# Patient Record
Sex: Male | Born: 1965 | Race: Black or African American | Hispanic: No | Marital: Single | Smoking: Current every day smoker
Health system: Southern US, Community
[De-identification: ages and names within clinical notes are randomized; demographics above are authoritative.]

## PROBLEM LIST (undated history)

## (undated) DIAGNOSIS — I509 Heart failure, unspecified: Secondary | ICD-10-CM

## (undated) DIAGNOSIS — F419 Anxiety disorder, unspecified: Secondary | ICD-10-CM

## (undated) DIAGNOSIS — F329 Major depressive disorder, single episode, unspecified: Secondary | ICD-10-CM

## (undated) DIAGNOSIS — F32A Depression, unspecified: Secondary | ICD-10-CM

## (undated) DIAGNOSIS — I214 Non-ST elevation (NSTEMI) myocardial infarction: Secondary | ICD-10-CM

## (undated) DIAGNOSIS — I639 Cerebral infarction, unspecified: Secondary | ICD-10-CM

## (undated) DIAGNOSIS — G4730 Sleep apnea, unspecified: Secondary | ICD-10-CM

## (undated) DIAGNOSIS — I4891 Unspecified atrial fibrillation: Secondary | ICD-10-CM

## (undated) DIAGNOSIS — G4710 Hypersomnia, unspecified: Secondary | ICD-10-CM

## (undated) DIAGNOSIS — I472 Ventricular tachycardia, unspecified: Secondary | ICD-10-CM

## (undated) DIAGNOSIS — IMO0001 Reserved for inherently not codable concepts without codable children: Secondary | ICD-10-CM

## (undated) DIAGNOSIS — I10 Essential (primary) hypertension: Secondary | ICD-10-CM

## (undated) DIAGNOSIS — E662 Morbid (severe) obesity with alveolar hypoventilation: Secondary | ICD-10-CM

## (undated) HISTORY — DX: Anxiety disorder, unspecified: F41.9

## (undated) HISTORY — DX: Morbid (severe) obesity with alveolar hypoventilation: E66.2

## (undated) HISTORY — DX: Sleep apnea, unspecified: G47.30

## (undated) HISTORY — DX: Hypersomnia, unspecified: G47.10

## (undated) HISTORY — DX: Morbid (severe) obesity due to excess calories: E66.01

## (undated) HISTORY — PX: TRACHEOSTOMY: SUR1362

---

## 1998-09-19 ENCOUNTER — Emergency Department (HOSPITAL_COMMUNITY): Admission: EM | Admit: 1998-09-19 | Discharge: 1998-09-19 | Payer: Self-pay | Admitting: Emergency Medicine

## 2003-12-05 DIAGNOSIS — I214 Non-ST elevation (NSTEMI) myocardial infarction: Secondary | ICD-10-CM

## 2003-12-05 HISTORY — DX: Non-ST elevation (NSTEMI) myocardial infarction: I21.4

## 2003-12-08 ENCOUNTER — Other Ambulatory Visit: Payer: Self-pay

## 2007-05-07 ENCOUNTER — Inpatient Hospital Stay: Payer: Self-pay | Admitting: Internal Medicine

## 2007-05-07 ENCOUNTER — Other Ambulatory Visit: Payer: Self-pay

## 2008-05-26 ENCOUNTER — Other Ambulatory Visit: Payer: Self-pay

## 2008-05-27 ENCOUNTER — Inpatient Hospital Stay: Payer: Self-pay | Admitting: Internal Medicine

## 2012-01-02 ENCOUNTER — Emergency Department: Payer: Self-pay | Admitting: Emergency Medicine

## 2012-01-23 ENCOUNTER — Emergency Department: Payer: Self-pay | Admitting: Unknown Physician Specialty

## 2012-05-16 ENCOUNTER — Inpatient Hospital Stay: Payer: Self-pay | Admitting: Internal Medicine

## 2012-05-16 LAB — BASIC METABOLIC PANEL
Anion Gap: 9 (ref 7–16)
BUN: 17 mg/dL (ref 7–18)
Chloride: 102 mmol/L (ref 98–107)
Co2: 28 mmol/L (ref 21–32)
EGFR (African American): >60
EGFR (Non-African Amer.): >60
Osmolality: 281 (ref 275–301)
Potassium: 3.9 mmol/L (ref 3.5–5.1)

## 2012-05-16 LAB — CK TOTAL AND CKMB (NOT AT ARMC)
CK, Total: 371 U/L — ABNORMAL HIGH (ref 35–232)
CK, Total: 297 U/L — ABNORMAL HIGH (ref 35–232)
CK-MB: 5.1 ng/mL — ABNORMAL HIGH (ref 0.5–3.6)
CK-MB: 4.1 ng/mL — ABNORMAL HIGH (ref 0.5–3.6)

## 2012-05-16 LAB — CBC
HCT: 41.4 % (ref 40.0–52.0)
HGB: 13.9 g/dL (ref 13.0–18.0)
MCH: 31.8 pg (ref 26.0–34.0)
MCHC: 33.5 g/dL (ref 32.0–36.0)
MCV: 95 fL (ref 80–100)
Platelet: 195 10*3/uL (ref 150–440)
RDW: 13.4 % (ref 11.5–14.5)
WBC: 4.9 10*3/uL (ref 3.8–10.6)

## 2012-05-16 LAB — TROPONIN I: Troponin-I: 0.09 ng/mL — ABNORMAL HIGH

## 2012-05-16 LAB — PROTIME-INR: Prothrombin Time: 14.6 secs (ref 11.5–14.7)

## 2012-05-17 DIAGNOSIS — I059 Rheumatic mitral valve disease, unspecified: Secondary | ICD-10-CM

## 2012-05-17 LAB — LIPID PANEL
Cholesterol: 154 mg/dL (ref 0–200)
HDL Cholesterol: 38 mg/dL — ABNORMAL LOW (ref 40–60)
Ldl Cholesterol, Calc: 99 mg/dL (ref 0–100)
Triglycerides: 85 mg/dL (ref 0–200)
VLDL Cholesterol, Calc: 17 mg/dL (ref 5–40)

## 2012-05-17 LAB — HEMOGLOBIN A1C: Hemoglobin A1C: 5.6 % (ref 4.2–6.3)

## 2012-05-17 LAB — CBC WITH DIFFERENTIAL/PLATELET
Basophil #: 0 10*3/uL (ref 0.0–0.1)
Eosinophil #: 0.4 10*3/uL (ref 0.0–0.7)
HCT: 41.8 % (ref 40.0–52.0)
HGB: 13.8 g/dL (ref 13.0–18.0)
Lymphocyte #: 1.3 10*3/uL (ref 1.0–3.6)
MCH: 31.8 pg (ref 26.0–34.0)
MCHC: 33 g/dL (ref 32.0–36.0)
MCV: 96 fL (ref 80–100)
Monocyte #: 0.8 x10 3/mm (ref 0.2–1.0)
Monocyte %: 15.9 %
Neutrophil #: 2.3 10*3/uL (ref 1.4–6.5)
Neutrophil %: 47.4 %
Platelet: 197 10*3/uL (ref 150–440)
RBC: 4.34 10*6/uL — ABNORMAL LOW (ref 4.40–5.90)
RDW: 13.8 % (ref 11.5–14.5)
WBC: 4.8 10*3/uL (ref 3.8–10.6)

## 2012-05-17 LAB — COMPREHENSIVE METABOLIC PANEL
Albumin: 3.6 g/dL (ref 3.4–5.0)
Alkaline Phosphatase: 64 U/L (ref 50–136)
Anion Gap: 7 (ref 7–16)
BUN: 18 mg/dL (ref 7–18)
Bilirubin,Total: 0.8 mg/dL (ref 0.2–1.0)
Calcium, Total: 8.8 mg/dL (ref 8.5–10.1)
Chloride: 101 mmol/L (ref 98–107)
Co2: 29 mmol/L (ref 21–32)
Creatinine: 0.83 mg/dL (ref 0.60–1.30)
Potassium: 3.5 mmol/L (ref 3.5–5.1)
SGOT(AST): 24 U/L (ref 15–37)
SGPT (ALT): 21 U/L
Sodium: 137 mmol/L (ref 136–145)
Total Protein: 7.3 g/dL (ref 6.4–8.2)

## 2012-05-17 LAB — CK TOTAL AND CKMB (NOT AT ARMC)
CK, Total: 230 U/L (ref 35–232)
CK-MB: 3.1 ng/mL (ref 0.5–3.6)

## 2012-05-17 LAB — TROPONIN I: Troponin-I: 0.06 ng/mL — ABNORMAL HIGH

## 2014-11-03 ENCOUNTER — Ambulatory Visit: Payer: Self-pay | Admitting: Internal Medicine

## 2014-11-16 ENCOUNTER — Inpatient Hospital Stay: Payer: Self-pay | Admitting: Internal Medicine

## 2014-11-16 LAB — CBC
HCT: 43.9 % (ref 40.0–52.0)
HGB: 13.9 g/dL (ref 13.0–18.0)
MCH: 30.3 pg (ref 26.0–34.0)
MCHC: 31.6 g/dL — ABNORMAL LOW (ref 32.0–36.0)
MCV: 96 fL (ref 80–100)
Platelet: 194 10*3/uL (ref 150–440)
RBC: 4.58 10*6/uL (ref 4.40–5.90)
RDW: 14.5 % (ref 11.5–14.5)
WBC: 6 10*3/uL (ref 3.8–10.6)

## 2014-11-16 LAB — PROTIME-INR
INR: 1.1
PROTHROMBIN TIME: 13.8 s (ref 11.5–14.7)

## 2014-11-16 LAB — DRUG SCREEN, URINE
AMPHETAMINES, UR SCREEN: NEGATIVE (ref ?–1000)
BARBITURATES, UR SCREEN: NEGATIVE (ref ?–200)
BENZODIAZEPINE, UR SCRN: NEGATIVE (ref ?–200)
COCAINE METABOLITE, UR ~~LOC~~: NEGATIVE (ref ?–300)
Cannabinoid 50 Ng, Ur ~~LOC~~: NEGATIVE (ref ?–50)
MDMA (ECSTASY) UR SCREEN: NEGATIVE (ref ?–500)
METHADONE, UR SCREEN: NEGATIVE (ref ?–300)
Opiate, Ur Screen: NEGATIVE (ref ?–300)
Phencyclidine (PCP) Ur S: NEGATIVE (ref ?–25)
TRICYCLIC, UR SCREEN: NEGATIVE (ref ?–1000)

## 2014-11-16 LAB — BASIC METABOLIC PANEL
Anion Gap: 2 — ABNORMAL LOW (ref 7–16)
BUN: 15 mg/dL (ref 7–18)
Calcium, Total: 8.6 mg/dL (ref 8.5–10.1)
Chloride: 99 mmol/L (ref 98–107)
Co2: 35 mmol/L — ABNORMAL HIGH (ref 21–32)
Creatinine: 1 mg/dL (ref 0.60–1.30)
EGFR (Non-African Amer.): >60
Glucose: 111 mg/dL — ABNORMAL HIGH (ref 65–99)
OSMOLALITY: 273 (ref 275–301)
Potassium: 4.4 mmol/L (ref 3.5–5.1)
SODIUM: 136 mmol/L (ref 136–145)

## 2014-11-16 LAB — CK TOTAL AND CKMB (NOT AT ARMC)
CK, TOTAL: 389 U/L — AB (ref 39–308)
CK-MB: 10.2 ng/mL — ABNORMAL HIGH (ref 0.5–3.6)

## 2014-11-16 LAB — APTT: ACTIVATED PTT: 28.1 s (ref 23.6–35.9)

## 2014-11-16 LAB — TROPONIN I
Troponin-I: 0.09 ng/mL — ABNORMAL HIGH
Troponin-I: 0.08 ng/mL — ABNORMAL HIGH

## 2014-11-16 LAB — PRO B NATRIURETIC PEPTIDE: B-TYPE NATIURETIC PEPTID: 1530 pg/mL — AB (ref 0–125)

## 2014-11-17 LAB — LIPID PANEL
Cholesterol: 150 mg/dL (ref 0–200)
HDL Cholesterol: 39 mg/dL — ABNORMAL LOW (ref 40–60)
Ldl Cholesterol, Calc: 95 mg/dL (ref 0–100)
Triglycerides: 81 mg/dL (ref 0–200)
VLDL CHOLESTEROL, CALC: 16 mg/dL (ref 5–40)

## 2014-11-17 LAB — CBC WITH DIFFERENTIAL/PLATELET
BASOS ABS: 0 10*3/uL (ref 0.0–0.1)
BASOS PCT: 0.4 %
Eosinophil #: 0 10*3/uL (ref 0.0–0.7)
Eosinophil %: 0.8 %
HCT: 44.8 % (ref 40.0–52.0)
HGB: 14.6 g/dL (ref 13.0–18.0)
LYMPHS ABS: 0.6 10*3/uL — AB (ref 1.0–3.6)
Lymphocyte %: 11.6 %
MCH: 31 pg (ref 26.0–34.0)
MCHC: 32.6 g/dL (ref 32.0–36.0)
MCV: 95 fL (ref 80–100)
MONO ABS: 0.4 x10 3/mm (ref 0.2–1.0)
MONOS PCT: 7.3 %
Neutrophil #: 4 10*3/uL (ref 1.4–6.5)
Neutrophil %: 79.9 %
Platelet: 196 10*3/uL (ref 150–440)
RBC: 4.71 10*6/uL (ref 4.40–5.90)
RDW: 14.2 % (ref 11.5–14.5)
WBC: 5 10*3/uL (ref 3.8–10.6)

## 2014-11-17 LAB — BASIC METABOLIC PANEL
Anion Gap: 6 — ABNORMAL LOW (ref 7–16)
BUN: 14 mg/dL (ref 7–18)
Calcium, Total: 8.7 mg/dL (ref 8.5–10.1)
Chloride: 99 mmol/L (ref 98–107)
Co2: 33 mmol/L — ABNORMAL HIGH (ref 21–32)
Creatinine: 1.11 mg/dL (ref 0.60–1.30)
EGFR (African American): >60
EGFR (Non-African Amer.): >60
GLUCOSE: 114 mg/dL — AB (ref 65–99)
Osmolality: 277 (ref 275–301)
POTASSIUM: 3.6 mmol/L (ref 3.5–5.1)
Sodium: 138 mmol/L (ref 136–145)

## 2014-11-17 LAB — PHOSPHORUS: Phosphorus: 3.1 mg/dL (ref 2.5–4.9)

## 2014-11-17 LAB — TROPONIN I: Troponin-I: 0.08 ng/mL — ABNORMAL HIGH

## 2014-11-17 LAB — MAGNESIUM: Magnesium: 1.6 mg/dL — ABNORMAL LOW

## 2014-11-19 LAB — BASIC METABOLIC PANEL
Anion Gap: 4 — ABNORMAL LOW (ref 7–16)
BUN: 27 mg/dL — AB (ref 7–18)
CALCIUM: 8.1 mg/dL — AB (ref 8.5–10.1)
CHLORIDE: 99 mmol/L (ref 98–107)
CREATININE: 1.28 mg/dL (ref 0.60–1.30)
Co2: 37 mmol/L — ABNORMAL HIGH (ref 21–32)
Glucose: 90 mg/dL (ref 65–99)
OSMOLALITY: 284 (ref 275–301)
Potassium: 3.3 mmol/L — ABNORMAL LOW (ref 3.5–5.1)
SODIUM: 140 mmol/L (ref 136–145)

## 2014-11-19 LAB — MAGNESIUM: Magnesium: 2.1 mg/dL

## 2014-11-19 LAB — PHOSPHORUS: Phosphorus: 5.3 mg/dL — ABNORMAL HIGH (ref 2.5–4.9)

## 2014-11-20 LAB — PHOSPHORUS: PHOSPHORUS: 5.1 mg/dL — AB (ref 2.5–4.9)

## 2014-11-20 LAB — BASIC METABOLIC PANEL
Anion Gap: 4 — ABNORMAL LOW (ref 7–16)
BUN: 30 mg/dL — ABNORMAL HIGH (ref 7–18)
Calcium, Total: 8.2 mg/dL — ABNORMAL LOW (ref 8.5–10.1)
Chloride: 100 mmol/L (ref 98–107)
Co2: 36 mmol/L — ABNORMAL HIGH (ref 21–32)
Creatinine: 1.19 mg/dL (ref 0.60–1.30)
EGFR (African American): >60
EGFR (Non-African Amer.): >60
GLUCOSE: 86 mg/dL (ref 65–99)
Osmolality: 285 (ref 275–301)
Potassium: 4.3 mmol/L (ref 3.5–5.1)
SODIUM: 140 mmol/L (ref 136–145)

## 2014-11-20 LAB — MAGNESIUM: MAGNESIUM: 2.1 mg/dL

## 2014-11-21 LAB — BASIC METABOLIC PANEL
ANION GAP: 5 — AB (ref 7–16)
BUN: 29 mg/dL — AB (ref 7–18)
CHLORIDE: 99 mmol/L (ref 98–107)
CREATININE: 1.06 mg/dL (ref 0.60–1.30)
Calcium, Total: 8.4 mg/dL — ABNORMAL LOW (ref 8.5–10.1)
Co2: 36 mmol/L — ABNORMAL HIGH (ref 21–32)
EGFR (African American): >60
EGFR (Non-African Amer.): >60
Glucose: 86 mg/dL (ref 65–99)
Osmolality: 285 (ref 275–301)
POTASSIUM: 4 mmol/L (ref 3.5–5.1)
Sodium: 140 mmol/L (ref 136–145)

## 2014-11-21 LAB — PHOSPHORUS: PHOSPHORUS: 3.3 mg/dL (ref 2.5–4.9)

## 2014-11-21 LAB — TRIGLYCERIDES: TRIGLYCERIDES: 125 mg/dL (ref 0–200)

## 2014-11-21 LAB — MAGNESIUM: Magnesium: 2.1 mg/dL

## 2014-11-22 LAB — BASIC METABOLIC PANEL
ANION GAP: 3 — AB (ref 7–16)
BUN: 29 mg/dL — ABNORMAL HIGH (ref 7–18)
CALCIUM: 8.6 mg/dL (ref 8.5–10.1)
CREATININE: 0.87 mg/dL (ref 0.60–1.30)
Chloride: 101 mmol/L (ref 98–107)
Co2: 36 mmol/L — ABNORMAL HIGH (ref 21–32)
EGFR (African American): >60
EGFR (Non-African Amer.): >60
Glucose: 120 mg/dL — ABNORMAL HIGH (ref 65–99)
Osmolality: 286 (ref 275–301)
POTASSIUM: 3.7 mmol/L (ref 3.5–5.1)
Sodium: 140 mmol/L (ref 136–145)

## 2014-11-22 LAB — PLATELET COUNT: Platelet: 212 10*3/uL (ref 150–440)

## 2014-11-22 LAB — PHOSPHORUS: Phosphorus: 2.7 mg/dL (ref 2.5–4.9)

## 2014-11-22 LAB — MAGNESIUM: Magnesium: 1.8 mg/dL

## 2014-11-23 LAB — MAGNESIUM: Magnesium: 2 mg/dL

## 2014-11-23 LAB — BASIC METABOLIC PANEL
Anion Gap: 5 — ABNORMAL LOW (ref 7–16)
BUN: 33 mg/dL — ABNORMAL HIGH (ref 7–18)
CHLORIDE: 98 mmol/L (ref 98–107)
CREATININE: 0.92 mg/dL (ref 0.60–1.30)
Calcium, Total: 8.5 mg/dL (ref 8.5–10.1)
Co2: 36 mmol/L — ABNORMAL HIGH (ref 21–32)
EGFR (African American): >60
Glucose: 111 mg/dL — ABNORMAL HIGH (ref 65–99)
OSMOLALITY: 285 (ref 275–301)
POTASSIUM: 4 mmol/L (ref 3.5–5.1)
Sodium: 139 mmol/L (ref 136–145)

## 2014-11-23 LAB — PHOSPHORUS: PHOSPHORUS: 3.5 mg/dL (ref 2.5–4.9)

## 2014-11-24 LAB — BASIC METABOLIC PANEL
ANION GAP: 3 — AB (ref 7–16)
BUN: 35 mg/dL — ABNORMAL HIGH (ref 7–18)
CALCIUM: 8.8 mg/dL (ref 8.5–10.1)
CHLORIDE: 97 mmol/L — AB (ref 98–107)
CREATININE: 0.9 mg/dL (ref 0.60–1.30)
Co2: 36 mmol/L — ABNORMAL HIGH (ref 21–32)
EGFR (African American): >60
EGFR (Non-African Amer.): >60
Glucose: 113 mg/dL — ABNORMAL HIGH (ref 65–99)
OSMOLALITY: 281 (ref 275–301)
POTASSIUM: 4.2 mmol/L (ref 3.5–5.1)
SODIUM: 136 mmol/L (ref 136–145)

## 2014-11-24 LAB — PHOSPHORUS: Phosphorus: 3.5 mg/dL (ref 2.5–4.9)

## 2014-11-24 LAB — TRIGLYCERIDES: TRIGLYCERIDES: 164 mg/dL (ref 0–200)

## 2014-11-24 LAB — EXPECTORATED SPUTUM ASSESSMENT W REFEX TO RESP CULTURE

## 2014-11-24 LAB — MAGNESIUM: MAGNESIUM: 2 mg/dL

## 2014-11-25 LAB — CBC WITH DIFFERENTIAL/PLATELET
BASOS ABS: 0 10*3/uL (ref 0.0–0.1)
Basophil %: 0.5 %
EOS ABS: 0.5 10*3/uL (ref 0.0–0.7)
EOS PCT: 5.1 %
HCT: 44.2 % (ref 40.0–52.0)
HGB: 14 g/dL (ref 13.0–18.0)
LYMPHS ABS: 0.9 10*3/uL — AB (ref 1.0–3.6)
LYMPHS PCT: 9.6 %
MCH: 30 pg (ref 26.0–34.0)
MCHC: 31.6 g/dL — ABNORMAL LOW (ref 32.0–36.0)
MCV: 95 fL (ref 80–100)
MONO ABS: 1.7 x10 3/mm — AB (ref 0.2–1.0)
Monocyte %: 18.6 %
NEUTROS ABS: 6.2 10*3/uL (ref 1.4–6.5)
NEUTROS PCT: 66.2 %
Platelet: 234 10*3/uL (ref 150–440)
RBC: 4.66 10*6/uL (ref 4.40–5.90)
RDW: 14.4 % (ref 11.5–14.5)
WBC: 9.4 10*3/uL (ref 3.8–10.6)

## 2014-11-25 LAB — BASIC METABOLIC PANEL
Anion Gap: 4 — ABNORMAL LOW (ref 7–16)
BUN: 34 mg/dL — AB (ref 7–18)
CALCIUM: 8.8 mg/dL (ref 8.5–10.1)
CO2: 34 mmol/L — AB (ref 21–32)
Chloride: 98 mmol/L (ref 98–107)
Creatinine: 0.77 mg/dL (ref 0.60–1.30)
EGFR (African American): >60
GLUCOSE: 104 mg/dL — AB (ref 65–99)
OSMOLALITY: 280 (ref 275–301)
POTASSIUM: 4 mmol/L (ref 3.5–5.1)
Sodium: 136 mmol/L (ref 136–145)

## 2014-11-25 LAB — MAGNESIUM: Magnesium: 2.1 mg/dL

## 2014-11-25 LAB — PHOSPHORUS: PHOSPHORUS: 3.3 mg/dL (ref 2.5–4.9)

## 2014-11-26 DIAGNOSIS — J9600 Acute respiratory failure, unspecified whether with hypoxia or hypercapnia: Secondary | ICD-10-CM

## 2014-11-26 LAB — BASIC METABOLIC PANEL
ANION GAP: 6 — AB (ref 7–16)
BUN: 36 mg/dL — AB (ref 7–18)
CALCIUM: 8.5 mg/dL (ref 8.5–10.1)
CHLORIDE: 95 mmol/L — AB (ref 98–107)
CREATININE: 0.93 mg/dL (ref 0.60–1.30)
Co2: 37 mmol/L — ABNORMAL HIGH (ref 21–32)
EGFR (African American): >60
Glucose: 107 mg/dL — ABNORMAL HIGH (ref 65–99)
OSMOLALITY: 284 (ref 275–301)
Potassium: 3.6 mmol/L (ref 3.5–5.1)
Sodium: 138 mmol/L (ref 136–145)

## 2014-11-26 LAB — PHOSPHORUS: Phosphorus: 3.2 mg/dL (ref 2.5–4.9)

## 2014-11-26 LAB — MAGNESIUM: MAGNESIUM: 2 mg/dL

## 2014-11-27 DIAGNOSIS — J9600 Acute respiratory failure, unspecified whether with hypoxia or hypercapnia: Secondary | ICD-10-CM

## 2014-11-27 LAB — TROPONIN I
TROPONIN-I: 0.03 ng/mL
Troponin-I: 0.03 ng/mL

## 2014-11-27 LAB — COMPREHENSIVE METABOLIC PANEL
ALBUMIN: 2.3 g/dL — AB (ref 3.4–5.0)
ALT: 28 U/L
ANION GAP: 8 (ref 7–16)
Alkaline Phosphatase: 87 U/L
BUN: 40 mg/dL — AB (ref 7–18)
Bilirubin,Total: 0.4 mg/dL (ref 0.2–1.0)
CALCIUM: 8.9 mg/dL (ref 8.5–10.1)
Chloride: 96 mmol/L — ABNORMAL LOW (ref 98–107)
Co2: 38 mmol/L — ABNORMAL HIGH (ref 21–32)
Creatinine: 0.89 mg/dL (ref 0.60–1.30)
EGFR (African American): >60
GLUCOSE: 111 mg/dL — AB (ref 65–99)
Osmolality: 294 (ref 275–301)
Potassium: 3.6 mmol/L (ref 3.5–5.1)
SGOT(AST): 20 U/L (ref 15–37)
SODIUM: 142 mmol/L (ref 136–145)
TOTAL PROTEIN: 7.2 g/dL (ref 6.4–8.2)

## 2014-11-28 LAB — MAGNESIUM: MAGNESIUM: 1.9 mg/dL

## 2014-11-28 LAB — BASIC METABOLIC PANEL
Anion Gap: 6 — ABNORMAL LOW (ref 7–16)
BUN: 37 mg/dL — ABNORMAL HIGH (ref 7–18)
CALCIUM: 9.3 mg/dL (ref 8.5–10.1)
CO2: 39 mmol/L — AB (ref 21–32)
Chloride: 96 mmol/L — ABNORMAL LOW (ref 98–107)
Creatinine: 0.9 mg/dL (ref 0.60–1.30)
EGFR (African American): >60
Glucose: 128 mg/dL — ABNORMAL HIGH (ref 65–99)
Osmolality: 292 (ref 275–301)
Potassium: 3.5 mmol/L (ref 3.5–5.1)
SODIUM: 141 mmol/L (ref 136–145)

## 2014-11-28 LAB — TROPONIN I: Troponin-I: 0.03 ng/mL

## 2014-11-28 LAB — POTASSIUM: Potassium: 3.5 mmol/L (ref 3.5–5.1)

## 2014-11-29 LAB — BASIC METABOLIC PANEL
Anion Gap: 4 — ABNORMAL LOW (ref 7–16)
BUN: 33 mg/dL — ABNORMAL HIGH (ref 7–18)
CALCIUM: 9.1 mg/dL (ref 8.5–10.1)
Chloride: 96 mmol/L — ABNORMAL LOW (ref 98–107)
Co2: 41 mmol/L (ref 21–32)
Creatinine: 0.76 mg/dL (ref 0.60–1.30)
EGFR (African American): >60
EGFR (Non-African Amer.): >60
GLUCOSE: 106 mg/dL — AB (ref 65–99)
Osmolality: 289 (ref 275–301)
Potassium: 3.5 mmol/L (ref 3.5–5.1)
Sodium: 141 mmol/L (ref 136–145)

## 2014-11-29 LAB — PHOSPHORUS: PHOSPHORUS: 3.4 mg/dL (ref 2.5–4.9)

## 2014-11-29 LAB — MAGNESIUM: Magnesium: 2.1 mg/dL

## 2014-11-30 LAB — CBC WITH DIFFERENTIAL/PLATELET
Basophil #: 0.1 10*3/uL (ref 0.0–0.1)
Basophil %: 0.5 %
Eosinophil #: 0.3 10*3/uL (ref 0.0–0.7)
Eosinophil %: 3.2 %
HCT: 40.8 % (ref 40.0–52.0)
HGB: 13.2 g/dL (ref 13.0–18.0)
Lymphocyte #: 1.2 10*3/uL (ref 1.0–3.6)
Lymphocyte %: 11.6 %
MCH: 30.2 pg (ref 26.0–34.0)
MCHC: 32.3 g/dL (ref 32.0–36.0)
MCV: 93 fL (ref 80–100)
MONOS PCT: 14.6 %
Monocyte #: 1.5 x10 3/mm — ABNORMAL HIGH (ref 0.2–1.0)
NEUTROS ABS: 7.1 10*3/uL — AB (ref 1.4–6.5)
Neutrophil %: 70.1 %
Platelet: 248 10*3/uL (ref 150–440)
RBC: 4.37 10*6/uL — ABNORMAL LOW (ref 4.40–5.90)
RDW: 14.1 % (ref 11.5–14.5)
WBC: 10.1 10*3/uL (ref 3.8–10.6)

## 2014-11-30 LAB — MAGNESIUM: Magnesium: 1.9 mg/dL

## 2014-11-30 LAB — BASIC METABOLIC PANEL
ANION GAP: 7 (ref 7–16)
BUN: 27 mg/dL — ABNORMAL HIGH (ref 7–18)
CO2: 37 mmol/L — AB (ref 21–32)
CREATININE: 0.79 mg/dL (ref 0.60–1.30)
Calcium, Total: 9.1 mg/dL (ref 8.5–10.1)
Chloride: 96 mmol/L — ABNORMAL LOW (ref 98–107)
EGFR (African American): >60
Glucose: 112 mg/dL — ABNORMAL HIGH (ref 65–99)
Osmolality: 285 (ref 275–301)
POTASSIUM: 3.4 mmol/L — AB (ref 3.5–5.1)
Sodium: 140 mmol/L (ref 136–145)

## 2014-11-30 LAB — PHOSPHORUS
Phosphorus: 2.1 mg/dL — ABNORMAL LOW (ref 2.5–4.9)
Phosphorus: 4.6 mg/dL (ref 2.5–4.9)

## 2014-11-30 LAB — POTASSIUM: POTASSIUM: 3.3 mmol/L — AB (ref 3.5–5.1)

## 2014-12-01 LAB — PHOSPHORUS: PHOSPHORUS: 3.9 mg/dL (ref 2.5–4.9)

## 2014-12-01 LAB — MAGNESIUM: Magnesium: 1.8 mg/dL

## 2014-12-01 LAB — PROTIME-INR
INR: 1.1
PROTHROMBIN TIME: 14.4 s (ref 11.5–14.7)

## 2014-12-01 LAB — BASIC METABOLIC PANEL
ANION GAP: 7 (ref 7–16)
BUN: 29 mg/dL — ABNORMAL HIGH (ref 7–18)
CHLORIDE: 98 mmol/L (ref 98–107)
CO2: 35 mmol/L — AB (ref 21–32)
Calcium, Total: 9.1 mg/dL (ref 8.5–10.1)
Creatinine: 0.73 mg/dL (ref 0.60–1.30)
EGFR (African American): >60
Glucose: 108 mg/dL — ABNORMAL HIGH (ref 65–99)
Osmolality: 286 (ref 275–301)
Potassium: 3.9 mmol/L (ref 3.5–5.1)
Sodium: 140 mmol/L (ref 136–145)

## 2014-12-02 LAB — CBC WITH DIFFERENTIAL/PLATELET
BASOS PCT: 0.7 %
Basophil #: 0.1 10*3/uL (ref 0.0–0.1)
EOS ABS: 0.4 10*3/uL (ref 0.0–0.7)
Eosinophil %: 5.2 %
HCT: 39.9 % — ABNORMAL LOW (ref 40.0–52.0)
HGB: 12.7 g/dL — ABNORMAL LOW (ref 13.0–18.0)
LYMPHS ABS: 1.1 10*3/uL (ref 1.0–3.6)
LYMPHS PCT: 13.8 %
MCH: 30.2 pg (ref 26.0–34.0)
MCHC: 31.8 g/dL — AB (ref 32.0–36.0)
MCV: 95 fL (ref 80–100)
MONOS PCT: 11.7 %
Monocyte #: 0.9 x10 3/mm (ref 0.2–1.0)
NEUTROS PCT: 68.6 %
Neutrophil #: 5.5 10*3/uL (ref 1.4–6.5)
PLATELETS: 265 10*3/uL (ref 150–440)
RBC: 4.21 10*6/uL — ABNORMAL LOW (ref 4.40–5.90)
RDW: 14.1 % (ref 11.5–14.5)
WBC: 8.1 10*3/uL (ref 3.8–10.6)

## 2014-12-02 LAB — BASIC METABOLIC PANEL
ANION GAP: 6 — AB (ref 7–16)
BUN: 24 mg/dL — AB (ref 7–18)
CREATININE: 0.76 mg/dL (ref 0.60–1.30)
Calcium, Total: 9.3 mg/dL (ref 8.5–10.1)
Chloride: 96 mmol/L — ABNORMAL LOW (ref 98–107)
Co2: 38 mmol/L — ABNORMAL HIGH (ref 21–32)
EGFR (Non-African Amer.): >60
Glucose: 94 mg/dL (ref 65–99)
Osmolality: 283 (ref 275–301)
Potassium: 3.8 mmol/L (ref 3.5–5.1)
Sodium: 140 mmol/L (ref 136–145)

## 2014-12-02 LAB — APTT: ACTIVATED PTT: 36 s — AB (ref 23.6–35.9)

## 2014-12-02 LAB — PHOSPHORUS: Phosphorus: 3.8 mg/dL (ref 2.5–4.9)

## 2014-12-02 LAB — PROTIME-INR
INR: 1.1
PROTHROMBIN TIME: 14.5 s (ref 11.5–14.7)

## 2014-12-02 LAB — TRIGLYCERIDES: Triglycerides: 118 mg/dL (ref 0–200)

## 2014-12-02 LAB — MAGNESIUM: MAGNESIUM: 1.8 mg/dL

## 2014-12-03 LAB — CBC WITH DIFFERENTIAL/PLATELET
BASOS PCT: 0.9 %
Basophil #: 0.1 10*3/uL (ref 0.0–0.1)
Eosinophil #: 0.4 10*3/uL (ref 0.0–0.7)
Eosinophil %: 5.5 %
HCT: 42.2 % (ref 40.0–52.0)
HGB: 13.5 g/dL (ref 13.0–18.0)
Lymphocyte #: 1.2 10*3/uL (ref 1.0–3.6)
Lymphocyte %: 15 %
MCH: 30.2 pg (ref 26.0–34.0)
MCHC: 32.1 g/dL (ref 32.0–36.0)
MCV: 94 fL (ref 80–100)
MONO ABS: 0.9 x10 3/mm (ref 0.2–1.0)
MONOS PCT: 11.4 %
Neutrophil #: 5.3 10*3/uL (ref 1.4–6.5)
Neutrophil %: 67.2 %
Platelet: 309 10*3/uL (ref 150–440)
RBC: 4.49 10*6/uL (ref 4.40–5.90)
RDW: 14.3 % (ref 11.5–14.5)
WBC: 7.9 10*3/uL (ref 3.8–10.6)

## 2014-12-03 LAB — MAGNESIUM: Magnesium: 1.9 mg/dL

## 2014-12-03 LAB — BASIC METABOLIC PANEL
Anion Gap: 6 — ABNORMAL LOW (ref 7–16)
BUN: 30 mg/dL — ABNORMAL HIGH (ref 7–18)
CALCIUM: 9.3 mg/dL (ref 8.5–10.1)
CO2: 35 mmol/L — AB (ref 21–32)
Chloride: 96 mmol/L — ABNORMAL LOW (ref 98–107)
Creatinine: 0.75 mg/dL (ref 0.60–1.30)
EGFR (African American): >60
EGFR (Non-African Amer.): >60
Glucose: 92 mg/dL (ref 65–99)
OSMOLALITY: 280 (ref 275–301)
Potassium: 3.7 mmol/L (ref 3.5–5.1)
Sodium: 137 mmol/L (ref 136–145)

## 2014-12-03 LAB — URINE CULTURE

## 2014-12-03 LAB — HEPARIN LEVEL (UNFRACTIONATED)
ANTI-XA(UNFRACTIONATED): 0.6 [IU]/mL (ref 0.30–0.70)
Anti-Xa(Unfractionated): 0.94 IU/mL — ABNORMAL HIGH (ref 0.30–0.70)

## 2014-12-03 LAB — PHOSPHORUS: Phosphorus: 3.3 mg/dL (ref 2.5–4.9)

## 2014-12-04 ENCOUNTER — Ambulatory Visit: Payer: Self-pay | Admitting: Internal Medicine

## 2014-12-04 LAB — CBC WITH DIFFERENTIAL/PLATELET
BASOS PCT: 0.6 %
Basophil #: 0 10*3/uL (ref 0.0–0.1)
EOS PCT: 4.3 %
Eosinophil #: 0.3 10*3/uL (ref 0.0–0.7)
HCT: 40.3 % (ref 40.0–52.0)
HGB: 12.8 g/dL — AB (ref 13.0–18.0)
Lymphocyte #: 0.9 10*3/uL — ABNORMAL LOW (ref 1.0–3.6)
Lymphocyte %: 12 %
MCH: 30.1 pg (ref 26.0–34.0)
MCHC: 31.7 g/dL — ABNORMAL LOW (ref 32.0–36.0)
MCV: 95 fL (ref 80–100)
MONO ABS: 0.7 x10 3/mm (ref 0.2–1.0)
Monocyte %: 9.2 %
Neutrophil #: 5.8 10*3/uL (ref 1.4–6.5)
Neutrophil %: 73.9 %
Platelet: 307 10*3/uL (ref 150–440)
RBC: 4.25 10*6/uL — ABNORMAL LOW (ref 4.40–5.90)
RDW: 14.3 % (ref 11.5–14.5)
WBC: 7.8 10*3/uL (ref 3.8–10.6)

## 2014-12-04 LAB — HEPARIN LEVEL (UNFRACTIONATED)
Anti-Xa(Unfractionated): 0.2 IU/mL — ABNORMAL LOW (ref 0.30–0.70)
Anti-Xa(Unfractionated): 0.39 IU/mL (ref 0.30–0.70)

## 2014-12-05 LAB — CBC WITH DIFFERENTIAL/PLATELET
Basophil #: 0 10*3/uL (ref 0.0–0.1)
Basophil %: 0.5 %
EOS ABS: 0.3 10*3/uL (ref 0.0–0.7)
Eosinophil %: 4.5 %
HCT: 41.1 % (ref 40.0–52.0)
HGB: 12.9 g/dL — ABNORMAL LOW (ref 13.0–18.0)
Lymphocyte #: 1.1 10*3/uL (ref 1.0–3.6)
Lymphocyte %: 13.5 %
MCH: 29.6 pg (ref 26.0–34.0)
MCHC: 31.4 g/dL — AB (ref 32.0–36.0)
MCV: 94 fL (ref 80–100)
Monocyte #: 1.1 x10 3/mm — ABNORMAL HIGH (ref 0.2–1.0)
Monocyte %: 13.5 %
Neutrophil #: 5.3 10*3/uL (ref 1.4–6.5)
Neutrophil %: 68 %
Platelet: 355 10*3/uL (ref 150–440)
RBC: 4.36 10*6/uL — ABNORMAL LOW (ref 4.40–5.90)
RDW: 14.2 % (ref 11.5–14.5)
WBC: 7.8 10*3/uL (ref 3.8–10.6)

## 2014-12-05 LAB — MAGNESIUM: MAGNESIUM: 1.8 mg/dL

## 2014-12-05 LAB — HEPARIN LEVEL (UNFRACTIONATED)
Anti-Xa(Unfractionated): 0.27 IU/mL — ABNORMAL LOW (ref 0.30–0.70)
Anti-Xa(Unfractionated): 0.39 IU/mL (ref 0.30–0.70)

## 2014-12-05 LAB — BASIC METABOLIC PANEL
Anion Gap: 5 — ABNORMAL LOW (ref 7–16)
BUN: 24 mg/dL — ABNORMAL HIGH (ref 7–18)
CALCIUM: 9.2 mg/dL (ref 8.5–10.1)
CREATININE: 0.75 mg/dL (ref 0.60–1.30)
Chloride: 96 mmol/L — ABNORMAL LOW (ref 98–107)
Co2: 38 mmol/L — ABNORMAL HIGH (ref 21–32)
EGFR (African American): >60
EGFR (Non-African Amer.): >60
Glucose: 152 mg/dL — ABNORMAL HIGH (ref 65–99)
Osmolality: 285 (ref 275–301)
Potassium: 3.7 mmol/L (ref 3.5–5.1)
Sodium: 139 mmol/L (ref 136–145)

## 2014-12-05 LAB — EXPECTORATED SPUTUM ASSESSMENT W REFEX TO RESP CULTURE

## 2014-12-05 LAB — PHOSPHORUS: PHOSPHORUS: 3.6 mg/dL (ref 2.5–4.9)

## 2014-12-06 LAB — HEPARIN LEVEL (UNFRACTIONATED): Anti-Xa(Unfractionated): 0.3 IU/mL (ref 0.30–0.70)

## 2014-12-06 LAB — CULTURE, BLOOD (SINGLE)

## 2014-12-06 LAB — CBC WITH DIFFERENTIAL/PLATELET
BASOS PCT: 0.6 %
Basophil #: 0.1 10*3/uL (ref 0.0–0.1)
Eosinophil #: 0.2 10*3/uL (ref 0.0–0.7)
Eosinophil %: 2.4 %
HCT: 39.5 % — AB (ref 40.0–52.0)
HGB: 12.7 g/dL — AB (ref 13.0–18.0)
Lymphocyte #: 0.9 10*3/uL — ABNORMAL LOW (ref 1.0–3.6)
Lymphocyte %: 9.7 %
MCH: 30.2 pg (ref 26.0–34.0)
MCHC: 32.1 g/dL (ref 32.0–36.0)
MCV: 94 fL (ref 80–100)
MONOS PCT: 12.3 %
Monocyte #: 1.1 x10 3/mm — ABNORMAL HIGH (ref 0.2–1.0)
NEUTROS ABS: 6.9 10*3/uL — AB (ref 1.4–6.5)
Neutrophil %: 75 %
Platelet: 362 10*3/uL (ref 150–440)
RBC: 4.2 10*6/uL — ABNORMAL LOW (ref 4.40–5.90)
RDW: 14.1 % (ref 11.5–14.5)
WBC: 9.2 10*3/uL (ref 3.8–10.6)

## 2014-12-07 LAB — BASIC METABOLIC PANEL
ANION GAP: 8 (ref 7–16)
BUN: 22 mg/dL — AB (ref 7–18)
CREATININE: 0.77 mg/dL (ref 0.60–1.30)
Calcium, Total: 9.6 mg/dL (ref 8.5–10.1)
Chloride: 97 mmol/L — ABNORMAL LOW (ref 98–107)
Co2: 34 mmol/L — ABNORMAL HIGH (ref 21–32)
EGFR (African American): >60
EGFR (Non-African Amer.): >60
Glucose: 110 mg/dL — ABNORMAL HIGH (ref 65–99)
Osmolality: 282 (ref 275–301)
Potassium: 3.6 mmol/L (ref 3.5–5.1)
Sodium: 139 mmol/L (ref 136–145)

## 2014-12-07 LAB — CBC WITH DIFFERENTIAL/PLATELET
Basophil #: 0 10*3/uL (ref 0.0–0.1)
Basophil %: 0.5 %
EOS ABS: 0.3 10*3/uL (ref 0.0–0.7)
Eosinophil %: 3.3 %
HCT: 40.7 % (ref 40.0–52.0)
HGB: 13.1 g/dL (ref 13.0–18.0)
Lymphocyte #: 1.2 10*3/uL (ref 1.0–3.6)
Lymphocyte %: 14.6 %
MCH: 30.2 pg (ref 26.0–34.0)
MCHC: 32.1 g/dL (ref 32.0–36.0)
MCV: 94 fL (ref 80–100)
MONOS PCT: 13 %
Monocyte #: 1.1 x10 3/mm — ABNORMAL HIGH (ref 0.2–1.0)
NEUTROS PCT: 68.6 %
Neutrophil #: 5.6 10*3/uL (ref 1.4–6.5)
PLATELETS: 379 10*3/uL (ref 150–440)
RBC: 4.33 10*6/uL — AB (ref 4.40–5.90)
RDW: 14.5 % (ref 11.5–14.5)
WBC: 8.2 10*3/uL (ref 3.8–10.6)

## 2014-12-07 LAB — PROTIME-INR
INR: 1.2
PROTHROMBIN TIME: 15.2 s — AB (ref 11.5–14.7)

## 2014-12-07 LAB — PHOSPHORUS: Phosphorus: 3.8 mg/dL (ref 2.5–4.9)

## 2014-12-07 LAB — MAGNESIUM
MAGNESIUM: 2.1 mg/dL
Magnesium: 1.7 mg/dL — ABNORMAL LOW

## 2014-12-07 LAB — HEPARIN LEVEL (UNFRACTIONATED): Anti-Xa(Unfractionated): 0.34 IU/mL (ref 0.30–0.70)

## 2014-12-08 LAB — BASIC METABOLIC PANEL
Anion Gap: 5 — ABNORMAL LOW (ref 7–16)
BUN: 25 mg/dL — AB (ref 7–18)
CHLORIDE: 98 mmol/L (ref 98–107)
CREATININE: 0.78 mg/dL (ref 0.60–1.30)
Calcium, Total: 9.1 mg/dL (ref 8.5–10.1)
Co2: 37 mmol/L — ABNORMAL HIGH (ref 21–32)
EGFR (African American): >60
EGFR (Non-African Amer.): >60
Glucose: 116 mg/dL — ABNORMAL HIGH (ref 65–99)
Osmolality: 285 (ref 275–301)
POTASSIUM: 3.5 mmol/L (ref 3.5–5.1)
SODIUM: 140 mmol/L (ref 136–145)

## 2014-12-08 LAB — CBC WITH DIFFERENTIAL/PLATELET
BASOS ABS: 0 10*3/uL (ref 0.0–0.1)
Basophil %: 0.5 %
Eosinophil #: 0.2 10*3/uL (ref 0.0–0.7)
Eosinophil %: 2.3 %
HCT: 39.2 % — ABNORMAL LOW (ref 40.0–52.0)
HGB: 12.5 g/dL — ABNORMAL LOW (ref 13.0–18.0)
LYMPHS ABS: 1.1 10*3/uL (ref 1.0–3.6)
Lymphocyte %: 11.4 %
MCH: 29.8 pg (ref 26.0–34.0)
MCHC: 32 g/dL (ref 32.0–36.0)
MCV: 93 fL (ref 80–100)
MONOS PCT: 13.3 %
Monocyte #: 1.2 x10 3/mm — ABNORMAL HIGH (ref 0.2–1.0)
NEUTROS PCT: 72.5 %
Neutrophil #: 6.8 10*3/uL — ABNORMAL HIGH (ref 1.4–6.5)
PLATELETS: 374 10*3/uL (ref 150–440)
RBC: 4.21 10*6/uL — AB (ref 4.40–5.90)
RDW: 14.2 % (ref 11.5–14.5)
WBC: 9.4 10*3/uL (ref 3.8–10.6)

## 2014-12-08 LAB — PROTIME-INR
INR: 1.2
Prothrombin Time: 15.1 secs — ABNORMAL HIGH (ref 11.5–14.7)

## 2014-12-08 LAB — MAGNESIUM: Magnesium: 1.8 mg/dL

## 2014-12-08 LAB — HEPARIN LEVEL (UNFRACTIONATED): Anti-Xa(Unfractionated): 0.29 IU/mL — ABNORMAL LOW (ref 0.30–0.70)

## 2014-12-08 LAB — PHOSPHORUS: Phosphorus: 4.3 mg/dL (ref 2.5–4.9)

## 2014-12-09 LAB — CBC WITH DIFFERENTIAL/PLATELET
BASOS PCT: 0.6 %
Basophil #: 0.1 10*3/uL (ref 0.0–0.1)
EOS PCT: 2 %
Eosinophil #: 0.2 10*3/uL (ref 0.0–0.7)
HCT: 40.8 % (ref 40.0–52.0)
HGB: 12.9 g/dL — AB (ref 13.0–18.0)
LYMPHS ABS: 1 10*3/uL (ref 1.0–3.6)
Lymphocyte %: 12.8 %
MCH: 30 pg (ref 26.0–34.0)
MCHC: 31.6 g/dL — ABNORMAL LOW (ref 32.0–36.0)
MCV: 95 fL (ref 80–100)
MONOS PCT: 14.6 %
Monocyte #: 1.2 x10 3/mm — ABNORMAL HIGH (ref 0.2–1.0)
NEUTROS ABS: 5.6 10*3/uL (ref 1.4–6.5)
Neutrophil %: 70 %
Platelet: 359 10*3/uL (ref 150–440)
RBC: 4.31 10*6/uL — AB (ref 4.40–5.90)
RDW: 14.3 % (ref 11.5–14.5)
WBC: 8 10*3/uL (ref 3.8–10.6)

## 2014-12-09 LAB — BASIC METABOLIC PANEL
ANION GAP: 6 — AB (ref 7–16)
BUN: 21 mg/dL — AB (ref 7–18)
CALCIUM: 9 mg/dL (ref 8.5–10.1)
CHLORIDE: 98 mmol/L (ref 98–107)
Co2: 35 mmol/L — ABNORMAL HIGH (ref 21–32)
Creatinine: 0.71 mg/dL (ref 0.60–1.30)
EGFR (African American): >60
EGFR (Non-African Amer.): >60
GLUCOSE: 120 mg/dL — AB (ref 65–99)
OSMOLALITY: 282 (ref 275–301)
Potassium: 3.3 mmol/L — ABNORMAL LOW (ref 3.5–5.1)
Sodium: 139 mmol/L (ref 136–145)

## 2014-12-09 LAB — PHOSPHORUS: PHOSPHORUS: 4.3 mg/dL (ref 2.5–4.9)

## 2014-12-09 LAB — MAGNESIUM: MAGNESIUM: 1.7 mg/dL — AB

## 2014-12-09 LAB — HEPARIN LEVEL (UNFRACTIONATED): ANTI-XA(UNFRACTIONATED): 0.37 [IU]/mL (ref 0.30–0.70)

## 2014-12-09 LAB — PROTIME-INR
INR: 1.2
PROTHROMBIN TIME: 15 s — AB (ref 11.5–14.7)

## 2014-12-10 LAB — CBC WITH DIFFERENTIAL/PLATELET
BASOS ABS: 0 10*3/uL (ref 0.0–0.1)
BASOS PCT: 0.4 %
Eosinophil #: 0.3 10*3/uL (ref 0.0–0.7)
Eosinophil %: 3.3 %
HCT: 40.8 % (ref 40.0–52.0)
HGB: 13.1 g/dL (ref 13.0–18.0)
LYMPHS PCT: 14.7 %
Lymphocyte #: 1.2 10*3/uL (ref 1.0–3.6)
MCH: 30 pg (ref 26.0–34.0)
MCHC: 32 g/dL (ref 32.0–36.0)
MCV: 94 fL (ref 80–100)
Monocyte #: 1.2 x10 3/mm — ABNORMAL HIGH (ref 0.2–1.0)
Monocyte %: 14.5 %
NEUTROS PCT: 67.1 %
Neutrophil #: 5.4 10*3/uL (ref 1.4–6.5)
Platelet: 319 10*3/uL (ref 150–440)
RBC: 4.36 10*6/uL — ABNORMAL LOW (ref 4.40–5.90)
RDW: 14 % (ref 11.5–14.5)
WBC: 8.1 10*3/uL (ref 3.8–10.6)

## 2014-12-10 LAB — BASIC METABOLIC PANEL
Anion Gap: 6 — ABNORMAL LOW (ref 7–16)
BUN: 17 mg/dL (ref 7–18)
CALCIUM: 9.2 mg/dL (ref 8.5–10.1)
Chloride: 99 mmol/L (ref 98–107)
Co2: 32 mmol/L (ref 21–32)
Creatinine: 0.86 mg/dL (ref 0.60–1.30)
GLUCOSE: 101 mg/dL — AB (ref 65–99)
Osmolality: 276 (ref 275–301)
POTASSIUM: 3.8 mmol/L (ref 3.5–5.1)
SODIUM: 137 mmol/L (ref 136–145)

## 2014-12-10 LAB — PROTIME-INR
INR: 1.2
Prothrombin Time: 15.3 secs — ABNORMAL HIGH (ref 11.5–14.7)

## 2014-12-10 LAB — HEPARIN LEVEL (UNFRACTIONATED): Anti-Xa(Unfractionated): 0.43 IU/mL (ref 0.30–0.70)

## 2014-12-10 LAB — MAGNESIUM: Magnesium: 1.8 mg/dL

## 2014-12-11 ENCOUNTER — Encounter: Payer: Self-pay | Admitting: *Deleted

## 2014-12-11 LAB — URINALYSIS, COMPLETE
BLOOD: NEGATIVE
Bacteria: NONE SEEN
Bilirubin,UR: NEGATIVE
Glucose,UR: NEGATIVE mg/dL (ref 0–75)
Ketone: NEGATIVE
LEUKOCYTE ESTERASE: NEGATIVE
NITRITE: NEGATIVE
PH: 6 (ref 4.5–8.0)
Specific Gravity: 1.02 (ref 1.003–1.030)
Squamous Epithelial: 1
WBC UR: 2 /HPF (ref 0–5)

## 2014-12-11 LAB — BASIC METABOLIC PANEL
Anion Gap: 8 (ref 7–16)
BUN: 16 mg/dL (ref 7–18)
CALCIUM: 9.6 mg/dL (ref 8.5–10.1)
CHLORIDE: 100 mmol/L (ref 98–107)
CO2: 31 mmol/L (ref 21–32)
CREATININE: 0.74 mg/dL (ref 0.60–1.30)
EGFR (African American): >60
EGFR (Non-African Amer.): >60
Glucose: 95 mg/dL (ref 65–99)
Osmolality: 279 (ref 275–301)
POTASSIUM: 3.5 mmol/L (ref 3.5–5.1)
Sodium: 139 mmol/L (ref 136–145)

## 2014-12-11 LAB — CBC WITH DIFFERENTIAL/PLATELET
Basophil #: 0.1 10*3/uL (ref 0.0–0.1)
Basophil %: 0.7 %
Eosinophil #: 0.3 10*3/uL (ref 0.0–0.7)
Eosinophil %: 3.8 %
HCT: 40.2 % (ref 40.0–52.0)
HGB: 13.3 g/dL (ref 13.0–18.0)
LYMPHS ABS: 1.2 10*3/uL (ref 1.0–3.6)
LYMPHS PCT: 13.6 %
MCH: 30.3 pg (ref 26.0–34.0)
MCHC: 33 g/dL (ref 32.0–36.0)
MCV: 92 fL (ref 80–100)
MONO ABS: 1 x10 3/mm (ref 0.2–1.0)
Monocyte %: 11.2 %
Neutrophil #: 6.3 10*3/uL (ref 1.4–6.5)
Neutrophil %: 70.7 %
Platelet: 323 10*3/uL (ref 150–440)
RBC: 4.38 10*6/uL — ABNORMAL LOW (ref 4.40–5.90)
RDW: 14.2 % (ref 11.5–14.5)
WBC: 9 10*3/uL (ref 3.8–10.6)

## 2014-12-11 LAB — PROTIME-INR
INR: 1.8
Prothrombin Time: 20.1 secs — ABNORMAL HIGH (ref 11.5–14.7)

## 2014-12-11 LAB — PHOSPHORUS: PHOSPHORUS: 4 mg/dL (ref 2.5–4.9)

## 2014-12-11 LAB — HEPARIN LEVEL (UNFRACTIONATED): ANTI-XA(UNFRACTIONATED): 0.49 [IU]/mL (ref 0.30–0.70)

## 2014-12-11 LAB — MAGNESIUM: Magnesium: 1.7 mg/dL — ABNORMAL LOW

## 2014-12-11 NOTE — PMR Pre-admission (Signed)
[place] [facility] PMR Admission Coordinator Pre-Admission Assessment  Patient: Andrew Sparks is an 49 y.o., male MRN: [id number] DOB: [birth year] Height: _0  (172.7 cm) Weight: 133.358 kg (294 lb)  Insurance Information Pt is self pay. Pt's sister has started the Medicaid and disability process.  Emergency Contact Information Contact Information    Name Relation Home Work Andrew Sparks Sister   [phone]   Andrew Sparks [phone]        Current Medical History  Patient Admitting Diagnosis: acute respiratory failure with acute encephalopathy  History of Present Illness: Andrew Sparks is a 50 year old morbidly obese male with h/o OSA/OHS, COPD with ongoing tobacco use, CHF, HTN who was admitted to [facility] on 35/57/32 with metabolic acidosis and decrease in LOC due to COPD exacerbation with acute diastolic CHF. He required intubation past admission and self extubated briefly. He was reintubated and treated with IV solumedrol, IV diuretics for fluid overload as well as IV antibiotics for strep PNA. Hospital course complicated by elevated cardiac enzymes due to medical issues, accelerated HTN requiring multiple medications for control, SVT as well as development of right brachial DVT. He was started on IV heparin for DVT and transition to coumadin.  He had difficulty weaning off vent and required PEG placement on  12/01/14 as well as trach by ENT on 12/07/14. He was started on tube feeds but developed N/V due to ileus and has now progressed to a mechanical soft diet with thin liquids. ENT modified trach to Shiley #6 on 12-14-14. Pt has made excellent progress with physical therapy and is currently on 28% trach collar. He and his family are motivated to come to inpatient rehab to maximize his functional return.    Patient's medical record from [facility] has been reviewed by the rehabilitation admission coordinator and physician.  Past Medical History   HTN, morbid obesity, bipolar, anxiety, tobacco abuse, HLP   Family History  family history is not on file.  Prior Rehab/Hospitalizations: Pt has had no previous rehab except was admitting to [facility] on 11-16-14 with respiratory distress. He has received acute therapy services during this hospitalization at [facility].   Current Medications See MAR  Patients Current Diet:  Mechanical soft with thin liquids as of 12-15-14  Precautions / Restrictions Precautions Precautions: Fall, Other (comment) (trach collar) Precaution Comments: Monitor O2 levels Restrictions Weight Bearing Restrictions: No   Prior Activity Level Community (5-7x/wk):  Pt was independent prior to admit but had significant sleep apnea. Chart indicates that pt would fall asleep while standing upright, even during cooking or smoking. Pt enjoyed walking outside and watching TV. Pt's sister did share her significant concerns that pt literally falls asleep while standing, cooking or smoking. She has concerns that her home may not be safe if pt forgets that the stove is on/is smoking.  Pt moved back to [place] from the [place] area in the fall. While in [place], pt was working at a [employer] working night shift. He had not been working while here in [place].   Home Assistive Devices / Equipment Home Assistive Devices/Equipment: None   Prior Functional Level Current Functional Level  Bed Mobility  Independent  Mod assist (use of bed rail and due to obese body stature) Note: pt does not like to lie back in bed and prefers to sleep sitting up in reciner.  Transfers  Independent  Min assist (with and without bariatric rolling walker)   Mobility - Walk/Wheelchair  Independent  Min assist (CGA to min assist to ambulate 10' intervals w/ & w/out FWW)   Upper Body Dressing  Independent  Other (not assessed, anticipate needs)   Lower Body Dressing  Independent  Other (not assessed, anticipate needs)   Grooming  Independent   Other (not assessed, anticipate needs)   Eating/Drinking  Independent  Other (pt is currently NPO due to trach issues, not assessed)   Toilet Transfer  Independent  Min assist (currently using BSC or bedpan)   Bladder Continence   WFL  using urinal   Bowel Management  WFL   last BM on 12-14-14   Stair Climbing   Independent  Other (not assessed at this time, anticipate needs)   Communication  [facility]  pt is mouthing words appropriately or writing as needed. He does not have a speaking valve yet.   Memory  WFL  pt says his thinking is a bit "off", unable to clearly assess at this time.   Cooking/Meal Prep  Independent      Housework  Independent    Money Management  Independent    Driving   Pt relied on the bus for transportation     Special needs/care consideration BiPAP/CPAP no  CPM no  Continuous Drip IV no  Dialysis no           Life Vest no  Oxygen - currently on 28% trach collar Special Bed - pt prefers to sleep sitting up in chair and does not like to lay back Trach Size - Shiley #6 Wound Vac (area) no       Skin - no issues                               Bowel mgmt: last BM on 12-14-14 Bladder mgmt: currently using urinal Diabetic mgmt - pt questions if he is diabetic but was not receiving treatment for it prior to admit  Previous Home Environment Living Arrangements: Other relatives (pt is living with his sister)  Lives With: Family Available Help at Discharge: Family Type of Home: House Home Layout: One level Home Access: Stairs to enter Entrance Stairs-Rails: None Entrance Stairs-Number of Steps: 1 Additional Comments: Pt's sister shared concerns over the severity of pt's sleep apnea and states that pt would fall asleep standing up. She has concerns about pt doing cooking tasks and falling asleep in the middle of the task. Chart from [facility] also indicated that pt had been living with his sister but was also at a homeless shelter recently due to her  safety concerns. Sister stated she is willing to take pt back to live with her but does have concerns over her safety with his tendency to fall asleep while smoking/cooking.  Discharge Living Setting Plans for Discharge Living Setting: Lives with (comment) (lives with his sister- see above comments as well) Type of Home at Discharge: House Discharge Home Layout: One level Discharge Home Access: Stairs to enter Entrance Stairs-Rails: None Entrance Stairs-Number of Steps: 1 Does the patient have any problems obtaining your medications?:  (pt relies on bus transportation but sister drives)  Social/Family/Support Systems Patient Roles: Other (Comment) (pt has a 32 year old son and lives with his sister. Pt's brother will also be available to help.) Contact Information: sister Freda Munro is primary contact Anticipated Caregiver: Sister and son (sister works as a Quarry manager at [employer], pt's son is not currently working and can help pt during the day. Pt's  brother is also planning to help as needed.) Anticipated Caregiver's Contact Information: see above Ability/Limitations of Caregiver: sister works but son is available  Careers adviser: 24/7 (24-7 between sister and pt's brother and pt's son) Does Caregiver/Family have Issues with Lodging/Transportation while Pt is in Rehab?: No  Goals/Additional Needs Patient/Family Goal for Rehab: Supervision to Mod Ind with PT, OT and SLP Expected length of stay: 14-18 days Cultural Considerations: none Equipment Needs: to be determined Pt/Family Agrees to Admission and willing to participate: Yes (I spoke with pt's sister by phone on 12-14-14.) Program Orientation Provided & Reviewed with Pt/Caregiver Including Roles  & Responsibilities: Yes  Patient Condition: I met with this patient at [facility] on 12-10-14 and 12-15-14 to discuss the possibility of inpatient rehab and he is strongly motivated to maximize his functional return following this involved medical course  of respiratory failure. This 49 year old patient was previously independent and had limitations due to his significant sleep apnea. Pt currently has a trach on 28% O2 and is needing minimal assistance with limited mobility at this time and has self care needs as well. He will benefit greatly from the multi-disciplinary team of skilled PT, OT, SLP and rehab nursing to maximize his functional return after this involved medical course. PT, OT and rehab nursing will focus on increasing strength for greater independence in bed mobility, transfers, gait and self care skills. Pt will benefit from further skilled speech services to determine diet advancement and voice training as well as to determine any higher level cognitive needs.  In addition, rehab nursing can focus on pt/family teaching with education on trach management, bowel and bladder issues, sleep apnea education and medication management. In addition, pt will benefit from rehab physician intervention to monitor his complex medical and respiratory status as well as sleep apnea issues. Discussed case with Dr. Naaman Plummer who stated that pt is a good inpatient rehab candidate and received medical clearance from Dr. Posey Pronto at [facility]. Pt and his sister are motivated to come to inpatient rehab and will benefit from the intensive services of skilled therapy under rehab physician guidance. Pt will be admitted today on 12-15-14.   Preadmission Screen Completed By:  Anne Hahn, 12/15/2014 11:17 AM ______________________________________________________________________   Discussed status with Dr. Naaman Plummer on 12-15-14 at 49 and received telephone approval for admission today.  Admission Coordinator:  Nanetta Batty, PT, time 1106/Date 12-15-14   Assessment/Plan: Diagnosis: acute encephalopathy after respiratory failure 1. Does the need for close, 24 hr/day  Medical supervision in concert with the patient's rehab needs make it unreasonable for this patient to be  served in a less intensive setting? Yes 2. Co-Morbidities requiring supervision/potential complications: respiratory failure/trach, resolving ileus. Chf, htn 3. Due to bladder management, bowel management, safety, skin/wound care, disease management, medication administration, pain management and patient education, does the patient require 24 hr/day rehab nursing? Yes 4. Does the patient require coordinated care of a physician, rehab nurse, PT (1-2 hrs/day, 5 days/week), OT (1-2 hrs/day, 5 days/week) and SLP (1-2 hrs/day, 5 days/week) to address physical and functional deficits in the context of the above medical diagnosis(es)? Yes Addressing deficits in the following areas: balance, endurance, locomotion, strength, transferring, bowel/bladder control, bathing, dressing, feeding, grooming, toileting, cognition, language, swallowing and psychosocial support 5. Can the patient actively participate in an intensive therapy program of at least 3 hrs of therapy 5 days a week? Yes 6. The potential for patient to make measurable gains while on inpatient rehab is excellent 7. Anticipated functional  outcomes upon discharge from inpatients are: modified independent and supervision PT, modified independent and supervision OT, modified independent and supervision SLP 8. Estimated rehab length of stay to reach the above functional goals is: 14-18 days 9. Does the patient have adequate social supports to accommodate these discharge functional goals? Yes 10. Anticipated D/C setting: Home 11. Anticipated post D/C treatments: [facility] therapy 12. Overall Rehab/Functional Prognosis: excellent    RECOMMENDATIONS: This patient's condition is appropriate for continued rehabilitative care in the following setting: CIR Patient has agreed to participate in recommended program. Yes Note that insurance prior authorization may be required for reimbursement for recommended care.  Comment: Admitting to inpatient rehab  today.  Meredith Staggers, MD, [facility] 12/15/2014   Nanetta Batty, PT 12/15/2014

## 2014-12-12 LAB — CBC WITH DIFFERENTIAL/PLATELET
BASOS ABS: 0 10*3/uL (ref 0.0–0.1)
Basophil %: 0.7 %
EOS ABS: 0.4 10*3/uL (ref 0.0–0.7)
Eosinophil %: 5.4 %
HCT: 40.7 % (ref 40.0–52.0)
HGB: 12.8 g/dL — ABNORMAL LOW (ref 13.0–18.0)
Lymphocyte #: 1.2 10*3/uL (ref 1.0–3.6)
Lymphocyte %: 17.4 %
MCH: 29.6 pg (ref 26.0–34.0)
MCHC: 31.6 g/dL — AB (ref 32.0–36.0)
MCV: 94 fL (ref 80–100)
MONO ABS: 0.8 x10 3/mm (ref 0.2–1.0)
Monocyte %: 12.3 %
NEUTROS PCT: 64.2 %
Neutrophil #: 4.3 10*3/uL (ref 1.4–6.5)
PLATELETS: 290 10*3/uL (ref 150–440)
RBC: 4.34 10*6/uL — AB (ref 4.40–5.90)
RDW: 14.1 % (ref 11.5–14.5)
WBC: 6.7 10*3/uL (ref 3.8–10.6)

## 2014-12-12 LAB — PROTIME-INR
INR: 3.6
PROTHROMBIN TIME: 34.7 s — AB (ref 11.5–14.7)

## 2014-12-13 LAB — PROTIME-INR
INR: 1.8
Prothrombin Time: 20.1 secs — ABNORMAL HIGH (ref 11.5–14.7)

## 2014-12-14 LAB — PROTIME-INR
INR: 1.8
PROTHROMBIN TIME: 20.4 s — AB (ref 11.5–14.7)

## 2014-12-15 ENCOUNTER — Other Ambulatory Visit: Payer: Self-pay | Admitting: Physical Medicine and Rehabilitation

## 2014-12-15 ENCOUNTER — Inpatient Hospital Stay (HOSPITAL_COMMUNITY): Payer: Medicaid Other

## 2014-12-15 ENCOUNTER — Inpatient Hospital Stay (HOSPITAL_COMMUNITY)
Admission: RE | Admit: 2014-12-15 | Discharge: 2014-12-23 | DRG: 947 | Disposition: A | Discharge status: Home Health | Payer: Medicaid Other | Source: Other Acute Inpatient Hospital | Attending: Physical Medicine & Rehabilitation | Admitting: Physical Medicine & Rehabilitation

## 2014-12-15 ENCOUNTER — Encounter (HOSPITAL_COMMUNITY): Payer: Self-pay | Admitting: *Deleted

## 2014-12-15 ENCOUNTER — Encounter: Payer: Self-pay | Admitting: Physical Medicine and Rehabilitation

## 2014-12-15 DIAGNOSIS — E876 Hypokalemia: Secondary | ICD-10-CM | POA: Diagnosis not present

## 2014-12-15 DIAGNOSIS — G4710 Hypersomnia, unspecified: Secondary | ICD-10-CM

## 2014-12-15 DIAGNOSIS — I10 Essential (primary) hypertension: Secondary | ICD-10-CM | POA: Diagnosis not present

## 2014-12-15 DIAGNOSIS — G931 Anoxic brain damage, not elsewhere classified: Secondary | ICD-10-CM | POA: Diagnosis present

## 2014-12-15 DIAGNOSIS — Z8673 Personal history of transient ischemic attack (TIA), and cerebral infarction without residual deficits: Secondary | ICD-10-CM | POA: Diagnosis not present

## 2014-12-15 DIAGNOSIS — E662 Morbid (severe) obesity with alveolar hypoventilation: Secondary | ICD-10-CM | POA: Diagnosis not present

## 2014-12-15 DIAGNOSIS — K5900 Constipation, unspecified: Secondary | ICD-10-CM | POA: Diagnosis not present

## 2014-12-15 DIAGNOSIS — G9340 Encephalopathy, unspecified: Secondary | ICD-10-CM

## 2014-12-15 DIAGNOSIS — Z9119 Patient's noncompliance with other medical treatment and regimen: Secondary | ICD-10-CM | POA: Diagnosis not present

## 2014-12-15 DIAGNOSIS — R5381 Other malaise: Principal | ICD-10-CM

## 2014-12-15 DIAGNOSIS — I5030 Unspecified diastolic (congestive) heart failure: Secondary | ICD-10-CM

## 2014-12-15 DIAGNOSIS — Z430 Encounter for attention to tracheostomy: Secondary | ICD-10-CM | POA: Diagnosis not present

## 2014-12-15 DIAGNOSIS — J449 Chronic obstructive pulmonary disease, unspecified: Secondary | ICD-10-CM

## 2014-12-15 DIAGNOSIS — E119 Type 2 diabetes mellitus without complications: Secondary | ICD-10-CM

## 2014-12-15 DIAGNOSIS — G4736 Sleep related hypoventilation in conditions classified elsewhere: Secondary | ICD-10-CM

## 2014-12-15 DIAGNOSIS — Z09 Encounter for follow-up examination after completed treatment for conditions other than malignant neoplasm: Secondary | ICD-10-CM

## 2014-12-15 DIAGNOSIS — I82621 Acute embolism and thrombosis of deep veins of right upper extremity: Secondary | ICD-10-CM

## 2014-12-15 DIAGNOSIS — F329 Major depressive disorder, single episode, unspecified: Secondary | ICD-10-CM

## 2014-12-15 DIAGNOSIS — E872 Acidosis: Secondary | ICD-10-CM | POA: Diagnosis not present

## 2014-12-15 DIAGNOSIS — F419 Anxiety disorder, unspecified: Secondary | ICD-10-CM

## 2014-12-15 DIAGNOSIS — Z931 Gastrostomy status: Secondary | ICD-10-CM

## 2014-12-15 DIAGNOSIS — G4733 Obstructive sleep apnea (adult) (pediatric): Secondary | ICD-10-CM | POA: Diagnosis not present

## 2014-12-15 DIAGNOSIS — Z720 Tobacco use: Secondary | ICD-10-CM | POA: Diagnosis not present

## 2014-12-15 DIAGNOSIS — Z930 Tracheostomy status: Secondary | ICD-10-CM | POA: Diagnosis not present

## 2014-12-15 DIAGNOSIS — R0602 Shortness of breath: Secondary | ICD-10-CM

## 2014-12-15 DIAGNOSIS — G9349 Other encephalopathy: Secondary | ICD-10-CM | POA: Diagnosis present

## 2014-12-15 HISTORY — DX: Major depressive disorder, single episode, unspecified: F32.9

## 2014-12-15 HISTORY — DX: Depression, unspecified: F32.A

## 2014-12-15 HISTORY — DX: Reserved for inherently not codable concepts without codable children: IMO0001

## 2014-12-15 HISTORY — DX: Essential (primary) hypertension: I10

## 2014-12-15 HISTORY — DX: Sleep apnea, unspecified: G47.30

## 2014-12-15 HISTORY — DX: Cerebral infarction, unspecified: I63.9

## 2014-12-15 LAB — GLUCOSE, CAPILLARY
GLUCOSE-CAPILLARY: 103 mg/dL — AB (ref 70–99)
Glucose-Capillary: 118 mg/dL — ABNORMAL HIGH (ref 70–99)

## 2014-12-15 LAB — PROTIME-INR
INR: 1.9
Prothrombin Time: 21.3 secs — ABNORMAL HIGH (ref 11.5–14.7)

## 2014-12-15 MED ORDER — NITROGLYCERIN 2 % TD OINT
1.0000 [in_us] | TOPICAL_OINTMENT | Freq: Two times a day (BID) | TRANSDERMAL | Status: DC
  Administered 2014-12-15 – 2014-12-23 (×16): 1 [in_us] via TOPICAL
  Filled 2014-12-15 (×2): qty 30

## 2014-12-15 MED ORDER — FAMOTIDINE 20 MG PO TABS
20.0000 mg | ORAL_TABLET | Freq: Two times a day (BID) | ORAL | Status: DC
  Administered 2014-12-15 – 2014-12-23 (×16): 20 mg via ORAL
  Filled 2014-12-15 (×19): qty 1

## 2014-12-15 MED ORDER — OXYCODONE HCL 5 MG PO TABS
5.0000 mg | ORAL_TABLET | Freq: Four times a day (QID) | ORAL | Status: DC | PRN
  Administered 2014-12-18 – 2014-12-21 (×3): 5 mg via ORAL
  Filled 2014-12-15 (×4): qty 1

## 2014-12-15 MED ORDER — ONDANSETRON HCL 4 MG PO TABS: 4.0000 mg | ORAL_TABLET | Freq: Four times a day (QID) | ORAL | Status: DC | PRN

## 2014-12-15 MED ORDER — INSULIN ASPART 100 UNIT/ML ~~LOC~~ SOLN
0.0000 [IU] | Freq: Three times a day (TID) | SUBCUTANEOUS | Status: DC
  Administered 2014-12-21: 1 [IU] via SUBCUTANEOUS

## 2014-12-15 MED ORDER — POTASSIUM CHLORIDE CRYS ER 20 MEQ PO TBCR
20.0000 meq | EXTENDED_RELEASE_TABLET | Freq: Every day | ORAL | Status: DC
  Filled 2014-12-15 (×2): qty 1

## 2014-12-15 MED ORDER — ONDANSETRON HCL 4 MG/2ML IJ SOLN: 4.0000 mg | Freq: Four times a day (QID) | INTRAMUSCULAR | Status: DC | PRN

## 2014-12-15 MED ORDER — SCOPOLAMINE 1 MG/3DAYS TD PT72
1.0000 | MEDICATED_PATCH | TRANSDERMAL | Status: DC
  Administered 2014-12-16: 1.5 mg via TRANSDERMAL
  Filled 2014-12-15: qty 1

## 2014-12-15 MED ORDER — CLONIDINE HCL 0.1 MG PO TABS
0.1000 mg | ORAL_TABLET | Freq: Three times a day (TID) | ORAL | Status: DC
  Administered 2014-12-15 – 2014-12-23 (×24): 0.1 mg via ORAL
  Filled 2014-12-15 (×27): qty 1

## 2014-12-15 MED ORDER — INFLUENZA VAC SPLIT QUAD 0.5 ML IM SUSY
0.5000 mL | PREFILLED_SYRINGE | INTRAMUSCULAR | Status: AC
  Administered 2014-12-16: 0.5 mL via INTRAMUSCULAR
  Filled 2014-12-15 (×2): qty 0.5

## 2014-12-15 MED ORDER — FUROSEMIDE 40 MG PO TABS
40.0000 mg | ORAL_TABLET | Freq: Every day | ORAL | Status: DC
  Administered 2014-12-16 – 2014-12-23 (×8): 40 mg via ORAL
  Filled 2014-12-15 (×9): qty 1

## 2014-12-15 MED ORDER — IPRATROPIUM-ALBUTEROL 0.5-2.5 (3) MG/3ML IN SOLN: 3.0000 mL | Freq: Four times a day (QID) | RESPIRATORY_TRACT | Status: DC

## 2014-12-15 MED ORDER — ALUM & MAG HYDROXIDE-SIMETH 200-200-20 MG/5ML PO SUSP: 30.0000 mL | ORAL | Status: DC | PRN

## 2014-12-15 MED ORDER — WARFARIN - PHARMACIST DOSING INPATIENT
Freq: Every day | Status: DC
  Administered 2014-12-15 – 2014-12-17 (×2)

## 2014-12-15 MED ORDER — BISACODYL 10 MG RE SUPP: 10.0000 mg | Freq: Every day | RECTAL | Status: DC | PRN

## 2014-12-15 MED ORDER — WARFARIN SODIUM 10 MG PO TABS
10.0000 mg | ORAL_TABLET | Freq: Once | ORAL | Status: DC
  Filled 2014-12-15: qty 1

## 2014-12-15 MED ORDER — IPRATROPIUM-ALBUTEROL 0.5-2.5 (3) MG/3ML IN SOLN: 3.0000 mL | RESPIRATORY_TRACT | Status: DC | PRN

## 2014-12-15 MED ORDER — LISINOPRIL 40 MG PO TABS
40.0000 mg | ORAL_TABLET | Freq: Two times a day (BID) | ORAL | Status: DC
  Administered 2014-12-15 – 2014-12-23 (×16): 40 mg via ORAL
  Filled 2014-12-15 (×18): qty 1

## 2014-12-15 MED ORDER — HYDRALAZINE HCL 50 MG PO TABS
100.0000 mg | ORAL_TABLET | Freq: Three times a day (TID) | ORAL | Status: DC
  Administered 2014-12-15 – 2014-12-23 (×24): 100 mg via ORAL
  Filled 2014-12-15 (×27): qty 2

## 2014-12-15 MED ORDER — ACETAMINOPHEN 325 MG PO TABS
325.0000 mg | ORAL_TABLET | ORAL | Status: DC | PRN
  Administered 2014-12-18: 325 mg via ORAL
  Filled 2014-12-15: qty 1

## 2014-12-15 MED ORDER — GUAIFENESIN-DM 100-10 MG/5ML PO SYRP: 5.0000 mL | ORAL_SOLUTION | Freq: Four times a day (QID) | ORAL | Status: DC | PRN

## 2014-12-15 MED ORDER — MAGNESIUM OXIDE 400 (241.3 MG) MG PO TABS
200.0000 mg | ORAL_TABLET | Freq: Two times a day (BID) | ORAL | Status: DC
  Administered 2014-12-15 – 2014-12-23 (×16): 200 mg via ORAL
  Filled 2014-12-15 (×19): qty 0.5

## 2014-12-15 MED ORDER — ALPRAZOLAM 0.25 MG PO TABS
0.2500 mg | ORAL_TABLET | Freq: Two times a day (BID) | ORAL | Status: DC
  Administered 2014-12-15: 0.25 mg via ORAL
  Filled 2014-12-15: qty 1

## 2014-12-15 MED ORDER — INSULIN ASPART 100 UNIT/ML ~~LOC~~ SOLN: 0.0000 [IU] | Freq: Every day | SUBCUTANEOUS | Status: DC

## 2014-12-15 MED ORDER — DIPHENHYDRAMINE HCL 12.5 MG/5ML PO ELIX: 12.5000 mg | ORAL_SOLUTION | Freq: Four times a day (QID) | ORAL | Status: DC | PRN

## 2014-12-15 MED ORDER — FLEET ENEMA 7-19 GM/118ML RE ENEM: 1.0000 | ENEMA | Freq: Once | RECTAL | Status: AC | PRN

## 2014-12-15 MED ORDER — WARFARIN VIDEO
Freq: Once | Status: AC
  Administered 2014-12-15: 17:00:00

## 2014-12-15 MED ORDER — CITALOPRAM HYDROBROMIDE 20 MG PO TABS
20.0000 mg | ORAL_TABLET | Freq: Every day | ORAL | Status: DC
  Administered 2014-12-16 – 2014-12-23 (×8): 20 mg via ORAL
  Filled 2014-12-15 (×9): qty 1

## 2014-12-15 MED ORDER — COUMADIN BOOK
Freq: Once | Status: AC
  Administered 2014-12-15: 17:00:00
  Filled 2014-12-15: qty 1

## 2014-12-15 MED ORDER — METOPROLOL TARTRATE 50 MG PO TABS
50.0000 mg | ORAL_TABLET | Freq: Two times a day (BID) | ORAL | Status: DC
  Administered 2014-12-15 – 2014-12-23 (×15): 50 mg via ORAL
  Filled 2014-12-15 (×18): qty 1

## 2014-12-15 MED ORDER — POTASSIUM CHLORIDE CRYS ER 20 MEQ PO TBCR: 20.0000 meq | EXTENDED_RELEASE_TABLET | Freq: Every day | ORAL | Status: DC

## 2014-12-15 NOTE — Progress Notes (Signed)
Respiratory therapy note-assessment done upon arrival to unit. All appropriate safety and emergency equipment at bedside, head of bed sheet placed. BBS equal and without wheezing, good strong cough with Shelbie HutchingPassey Muir valve in place. Reported off to RN. Humidified room air tracheostomy collar with sp02 96, sitting up in chair.

## 2014-12-15 NOTE — Progress Notes (Addendum)
ANTICOAGULATION CONSULT NOTE - Initial Consult  Pharmacy Consult for Coumadin Indication: DVT  Not on File  Patient Measurements:   Heparin Dosing Weight:    Vital Signs:    Labs: No results for input(s): HGB, HCT, PLT, APTT, LABPROT, INR, HEPARINUNFRC, CREATININE, CKTOTAL, CKMB, TROPONINI in the last 72 hours.  CrCl cannot be calculated (Patient has no serum creatinine result on file.).   Medical History: Past Medical History  Diagnosis Date  . Anxiety   . Morbid obesity   . Obesity hypoventilation syndrome   . Hypersomnia with sleep apnea   . Hypertension   . Stroke   . Sleep apnea   . Shortness of breath dyspnea   . Depression     Medications:   Assessment: 49 y/o noncompliant patient s/p encephalopathy and acute respiratory failure now with trach transferred from [facility] to rehab.  11/16/14: Hospital course at [facility] was complicated by metabolic acidosis and encephalopathy due to COPD and HF exac. Patient required intubation, IV diuretics, and abx for strep PNA. He also experienced elevated cardiac enzynmes, HTN, SVT, ileus, and new R brachial DVT.  Anticoag: IV Heparin>>Coumadin at [facility]. INR 1.9 this AM. Last noted Hgb 12.8 on 1/9. Doses of Warfarin prescribed cannot be found in [facility] notes but pt was discharged to rehab on 7.5mg  daily.  1/5: INR 1.5314m HL 0.29 1/6: INR 1.2, HL 0.37 1/7: INR 1.2, HL 0.43 1/8: INR 1.8, HL 0.49 1/9: INR 3.6 1/10: INR 1.8 1/11: INR 1.8 1/12: INR 1.9  Goal of Therapy:  INR 2-3 Monitor platelets by anticoagulation protocol: Yes   Plan:  Coumadin 7.5mg  po x 1 already documented at [facility] this AM Daily INR Coumadin book/video  Andrew Sparks, PharmD, BCPS Clinical Staff Pharmacist Pager [phone]  Andrew Sparks, Andrew Sparks 12/15/2014,3:59 PM

## 2014-12-16 ENCOUNTER — Inpatient Hospital Stay (HOSPITAL_COMMUNITY): Payer: Medicaid Other | Admitting: Speech Pathology

## 2014-12-16 ENCOUNTER — Inpatient Hospital Stay (HOSPITAL_COMMUNITY): Payer: Medicaid Other | Admitting: Occupational Therapy

## 2014-12-16 ENCOUNTER — Inpatient Hospital Stay (HOSPITAL_COMMUNITY): Payer: Self-pay | Admitting: Rehabilitation

## 2014-12-16 DIAGNOSIS — Z931 Gastrostomy status: Secondary | ICD-10-CM

## 2014-12-16 DIAGNOSIS — R5381 Other malaise: Secondary | ICD-10-CM | POA: Diagnosis present

## 2014-12-16 DIAGNOSIS — R1314 Dysphagia, pharyngoesophageal phase: Secondary | ICD-10-CM

## 2014-12-16 DIAGNOSIS — E662 Morbid (severe) obesity with alveolar hypoventilation: Secondary | ICD-10-CM | POA: Diagnosis present

## 2014-12-16 DIAGNOSIS — G9349 Other encephalopathy: Secondary | ICD-10-CM

## 2014-12-16 DIAGNOSIS — Z930 Tracheostomy status: Secondary | ICD-10-CM

## 2014-12-16 LAB — HEMOGLOBIN A1C
HEMOGLOBIN A1C: 6.3 % — AB (ref <5.7)
MEAN PLASMA GLUCOSE: 134 mg/dL — AB (ref <117)

## 2014-12-16 LAB — COMPREHENSIVE METABOLIC PANEL
ALT: 15 U/L (ref 0–53)
AST: 16 U/L (ref 0–37)
Albumin: 2.8 g/dL — ABNORMAL LOW (ref 3.5–5.2)
Alkaline Phosphatase: 65 U/L (ref 39–117)
Anion gap: 9 (ref 5–15)
BUN: 8 mg/dL (ref 6–23)
CALCIUM: 8.7 mg/dL (ref 8.4–10.5)
CO2: 29 mmol/L (ref 19–32)
Chloride: 95 mEq/L — ABNORMAL LOW (ref 96–112)
Creatinine, Ser: 0.85 mg/dL (ref 0.50–1.35)
GFR calc Af Amer: >90 mL/min (ref >90)
GFR calc non Af Amer: >90 mL/min (ref >90)
Glucose, Bld: 113 mg/dL — ABNORMAL HIGH (ref 70–99)
Potassium: 3.2 mmol/L — ABNORMAL LOW (ref 3.5–5.1)
Sodium: 133 mmol/L — ABNORMAL LOW (ref 135–145)
TOTAL PROTEIN: 6.6 g/dL (ref 6.0–8.3)
Total Bilirubin: 0.5 mg/dL (ref 0.3–1.2)

## 2014-12-16 LAB — GLUCOSE, CAPILLARY
GLUCOSE-CAPILLARY: 132 mg/dL — AB (ref 70–99)
GLUCOSE-CAPILLARY: 113 mg/dL — AB (ref 70–99)
Glucose-Capillary: 112 mg/dL — ABNORMAL HIGH (ref 70–99)
Glucose-Capillary: 116 mg/dL — ABNORMAL HIGH (ref 70–99)

## 2014-12-16 LAB — CBC WITH DIFFERENTIAL/PLATELET
BASOS PCT: 0 % (ref 0–1)
Basophils Absolute: 0 10*3/uL (ref 0.0–0.1)
EOS PCT: 4 % (ref 0–5)
Eosinophils Absolute: 0.3 10*3/uL (ref 0.0–0.7)
HCT: 36.9 % — ABNORMAL LOW (ref 39.0–52.0)
Hemoglobin: 11.9 g/dL — ABNORMAL LOW (ref 13.0–17.0)
LYMPHS ABS: 1.8 10*3/uL (ref 0.7–4.0)
Lymphocytes Relative: 27 % (ref 12–46)
MCH: 29.3 pg (ref 26.0–34.0)
MCHC: 32.2 g/dL (ref 30.0–36.0)
MCV: 90.9 fL (ref 78.0–100.0)
Monocytes Absolute: 0.7 10*3/uL (ref 0.1–1.0)
Monocytes Relative: 11 % (ref 3–12)
NEUTROS PCT: 58 % (ref 43–77)
Neutro Abs: 4 10*3/uL (ref 1.7–7.7)
Platelets: 229 10*3/uL (ref 150–400)
RBC: 4.06 MIL/uL — ABNORMAL LOW (ref 4.22–5.81)
RDW: 13.2 % (ref 11.5–15.5)
WBC: 6.8 10*3/uL (ref 4.0–10.5)

## 2014-12-16 LAB — PROTIME-INR
INR: 2.54 — ABNORMAL HIGH (ref 0.00–1.49)
PROTHROMBIN TIME: 27.5 s — AB (ref 11.6–15.2)

## 2014-12-16 LAB — CULTURE, BLOOD (SINGLE)

## 2014-12-16 MED ORDER — WARFARIN SODIUM 5 MG PO TABS
5.0000 mg | ORAL_TABLET | Freq: Once | ORAL | Status: AC
  Administered 2014-12-16: 5 mg via ORAL
  Filled 2014-12-16: qty 1

## 2014-12-16 MED ORDER — POTASSIUM CHLORIDE CRYS ER 10 MEQ PO TBCR: 10.0000 meq | EXTENDED_RELEASE_TABLET | Freq: Two times a day (BID) | ORAL | Status: DC

## 2014-12-16 MED ORDER — POTASSIUM CHLORIDE CRYS ER 20 MEQ PO TBCR
20.0000 meq | EXTENDED_RELEASE_TABLET | Freq: Three times a day (TID) | ORAL | Status: DC
Start: 2014-12-16 — End: 2014-12-18
  Administered 2014-12-16 – 2014-12-18 (×7): 20 meq via ORAL
  Filled 2014-12-16 (×10): qty 1

## 2014-12-16 MED ORDER — ALPRAZOLAM 0.25 MG PO TABS
0.2500 mg | ORAL_TABLET | Freq: Two times a day (BID) | ORAL | Status: DC | PRN
  Administered 2014-12-18 – 2014-12-22 (×7): 0.25 mg via ORAL
  Filled 2014-12-16 (×7): qty 1

## 2014-12-16 MED ORDER — GLUCERNA SHAKE PO LIQD
237.0000 mL | Freq: Two times a day (BID) | ORAL | Status: DC
  Administered 2014-12-16 – 2014-12-17 (×2): 237 mL via ORAL

## 2014-12-16 MED ORDER — FREE WATER
200.0000 mL | Freq: Three times a day (TID) | Status: DC
  Administered 2014-12-16 – 2014-12-23 (×22): 200 mL

## 2014-12-16 MED ORDER — TRAZODONE HCL 50 MG PO TABS: 50.0000 mg | ORAL_TABLET | Freq: Every evening | ORAL | Status: DC | PRN

## 2014-12-16 NOTE — Progress Notes (Signed)
Social Work Patient ID: Andrew Sparks, male   DOB: 1966/07/22, 49 y.o.   MRN: [id number] Met with pt, sister and niece to discuss team conference goals-supervision level and discharge 1/19.  Aware of the need for intensive education prior to discharge. Pt is pleased with his progress, feels main issues is deconditioned and some balance issues from being in bed for 4 weeks.  Gave sister medical record to take to disability attorney he is Working with from the shelter in [place].  Work on discharge plans.

## 2014-12-16 NOTE — Care Management Note (Signed)
Inpatient Rehabilitation Center Individual Statement of Services  Patient Name:  [name]  Date:  12/16/2014  Welcome to the Inpatient Rehabilitation Center.  Our goal is to provide you with an individualized program based on your diagnosis and situation, designed to meet your specific needs.  With this comprehensive rehabilitation program, you will be expected to participate in at least 3 hours of rehabilitation therapies Monday-Friday, with modified therapy programming on the weekends.  Your rehabilitation program will include the following services:  Physical Therapy (PT), Occupational Therapy (OT), Speech Therapy (ST), 24 hour per day rehabilitation nursing, Neuropsychology, Case Management (Social Worker), Rehabilitation Medicine, Nutrition Services and Pharmacy Services  Weekly team conferences will be held on Wednesday to discuss your progress.  Your Social Worker will talk with you frequently to get your input and to update you on team discussions.  Team conferences with you and your family in attendance may also be held.  Expected length of stay: 7-9 days  Overall anticipated outcome: supervision/mod/i level  Depending on your progress and recovery, your program may change. Your Social Worker will coordinate services and will keep you informed of any changes. Your Social Worker's name and contact numbers are listed  below.  The following services may also be recommended but are not provided by the Inpatient Rehabilitation Center:    Home Health Rehabiltiation Services  Outpatient Rehabilitation Services   Arrangements will be made to provide these services after discharge if needed.  Arrangements include referral to agencies that provide these services.  Your insurance has been verified to be:  SSD and Medicaid pending Your primary doctor is:  None  Pertinent information will be shared with your doctor and your insurance company.  Social Worker:  Dossie DerBecky Melissa Tomaselli, SW  [phone] or ([phone]) [phone]  Information discussed with and copy given to patient by: Lucy Chrisupree, Nadalee Neiswender G, 12/16/2014, 12:43 PM

## 2014-12-16 NOTE — Evaluation (Addendum)
Physical Therapy Assessment and Plan  Patient Details  Name: Andrew Sparks MRN: [id number] Date of Birth: 1966/04/01  PT Diagnosis: Abnormal posture, Abnormality of gait, Cognitive deficits, Difficulty walking and Muscle weakness (generalized) Rehab Potential: Excellent ELOS: 6-7 days   Today's Date: 12/16/2014 PT Individual Time: 1300-1402 PT Individual Time Calculation (min): 62 min    Problem List:  Patient Active Problem List   Diagnosis Date Noted  . 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 Debility 12/16/2014  . Obesity hypoventilation syndrome 12/16/2014  . Encephalopathy pulmonary 12/15/2014    Past Medical History:  Past Medical History  Diagnosis Date  . Anxiety   . Morbid obesity   . Obesity hypoventilation syndrome   . Hypersomnia with sleep apnea   . Hypertension   . Stroke   . Sleep apnea   . Shortness of breath dyspnea   . Depression    Past Surgical History: No past surgical history on file.  Assessment & Plan Clinical Impression: Patient is a 49 y.o. year old male morbidly obese male with h/o OSA/OHS, hypersomnia, COPD with ongoing tobacco use, CHF, HTN who was admitted to [facility] on 05/29/93 with metabolic acidosis and decrease in LOC with encephalopathy due to COPD exacerbation with acute diastolic CHF. He declined in ED requiring intubation and was started on IV solumedrol, IV diuretics for fluid overload as well as IV antibiotics for strep PNA.Marland Kitchen He self extubated.  He was reintubated and Hospital course complicated by elevated cardiac enzymes due to medical issues, accelerated HTN requiring multiple medications for control, SVT as well as development of right brachial DVT. He was started on IV heparin for DVT and transition to coumadin. He had difficulty weaning off vent and required PEG placement on 12/01/14 as well as trach by ENT on 12/07/14. He was started on tube feeds but developed N/V due to ileus which has  resolved. He has been unable to tolerate finger occlusion therefore PMSV not attempted by ST. His trach was downsized to #6 and ENT recommends continuing this till weight loss due to his non-compliance with CPAP.  Patient transferred to CIR on 12/15/2014 .   Patient currently requires min with mobility secondary to muscle weakness, decreased cardiorespiratoy endurance and decreased oxygen support and decreased awareness, decreased problem solving and decreased memory.  Prior to hospitalization, patient was independent  with mobility and lived with Family in a House home.  Home access is 1Stairs to enter.  Patient will benefit from skilled PT intervention to maximize safe functional mobility, minimize fall risk and decrease caregiver burden for planned discharge home with 24 hour supervision.  Anticipate patient will benefit from follow up [place] at discharge.  PT - End of Session Activity Tolerance: Tolerates 30+ min activity with multiple rests Endurance Deficit: Yes Endurance Deficit Description: Pt fatigues quickly and requires multiple rest breaks due to poor endurance and back pain PT Assessment Rehab Potential (ACUTE/IP ONLY): Excellent PT Patient demonstrates impairments in the following area(s): Balance;Edema;Endurance;Motor;Safety PT Transfers Functional Problem(s): Bed Mobility;Bed to Chair;Car;Furniture PT Locomotion Functional Problem(s): Ambulation;Stairs PT Plan PT Intensity: Minimum of 1-2 x/day ,45 to 90 minutes PT Frequency: 5 out of 7 days PT Duration Estimated Length of Stay: 6-7 days PT Treatment/Interventions: Ambulation/gait training;Balance/vestibular training;Cognitive remediation/compensation;Discharge planning;Disease management/prevention;DME/adaptive equipment instruction;Functional mobility training;Neuromuscular re-education;Patient/family education;Skin care/wound management;Stair training;Therapeutic Activities;Therapeutic Exercise;UE/LE Strength taining/ROM PT  Transfers Anticipated Outcome(s): mod I PT Locomotion Anticipated Outcome(s): S at an ambulatory level PT Recommendation Follow Up Recommendations: Home health PT;24  hour supervision/assistance Patient destination: Home Equipment Recommended: To be determined  Skilled Therapeutic Intervention PT assessment and evaluation completed, see full details below.  Initiated gait training with RW in controlled and home environment.  Requires min A to close S with use of RW and min A without use of RW.  Began BERG balance test, however due to time limitations, did not finish.  Applied B ace wraps from mid foot to thigh for swelling control per MD orders.  Discussed and educated on ELOS, expected outcomes and equipment needs.  Pt verbalized understanding.   Note:  Pt on 28% FiO2 in room, however trach collar set to 35%.  RN notified and pt set to 30% during PT session.  Pt did intermittently drop to upper 80's, but with rest and deep breathing, was able to increase to 96-98%.    PT Evaluation Precautions/Restrictions Precautions Precautions: Fall;Other (comment) Precaution Comments: trach- monitor O2 levels Restrictions Weight Bearing Restrictions: No General   Vital SignsTherapy Vitals Temp: 98.6 F (37 C) Temp Source: Oral Pulse Rate: 63 Resp: 20 BP: (!) 162/97 mmHg Patient Position (if appropriate): Sitting Oxygen Therapy SpO2: 90 % O2 Device: Tracheostomy Collar Pain Pain Assessment Pain Assessment: No/denies pain Home Living/Prior Functioning Home Living Living Arrangements: Other relatives Available Help at Discharge: Family (sister works during day, cousins to assist) Type of Home: House Home Access: Stairs to enter Technical brewer of Steps: 1 Entrance Stairs-Rails: None Home Layout: One level  Lives With: Family Prior Function Level of Independence: Independent with basic ADLs;Independent with gait;Independent with transfers  Able to Take Stairs?: Yes Driving:  Yes Vocation: Unemployed Leisure: Hobbies-yes (Comment) Comments: likes basketball and tossing horseshoes Vision/Perception   See OT note Cognition Arousal/Alertness: Awake/alert Orientation Level: Oriented to place;Disoriented to situation Attention: Selective Selective Attention: Appears intact Memory: Impaired Memory Impairment: Decreased recall of new information;Decreased short term memory Awareness: Impaired Awareness Impairment: Anticipatory impairment Problem Solving: Appears intact Safety/Judgment: Appears intact Comments: Pt able to voice that he needs to call for assist prior to getting up out of chair.  Sensation Sensation Light Touch: Appears Intact Stereognosis: Not tested Hot/Cold: Not tested Proprioception: Appears Intact Coordination Gross Motor Movements are Fluid and Coordinated: Yes Fine Motor Movements are Fluid and Coordinated: Yes Heel Shin Test: unable to test, limited by body habitus and also swelling in BLEs.  Motor  Motor Motor: Other (comment);Abnormal postural alignment and control Motor - Skilled Clinical Observations: Pt with decreased balance, endurance and forward flexed posture.   Mobility Bed Mobility Bed Mobility: Supine to Sit;Sit to Supine Supine to Sit: 5: Supervision Supine to Sit Details: Verbal cues for sequencing;Verbal cues for technique;Verbal cues for precautions/safety Sit to Supine: 5: Supervision Sit to Supine - Details: Verbal cues for sequencing;Verbal cues for technique;Verbal cues for precautions/safety Transfers Transfers: Yes Sit to Stand: 4: Min assist Sit to Stand Details: Verbal cues for sequencing;Verbal cues for technique;Verbal cues for precautions/safety;Manual facilitation for weight shifting Stand to Sit: 4: Min assist Stand to Sit Details (indicate cue type and reason): Verbal cues for sequencing;Verbal cues for technique;Verbal cues for precautions/safety;Manual facilitation for weight shifting Stand Pivot  Transfers: 4: Min assist Stand Pivot Transfer Details: Verbal cues for sequencing;Verbal cues for technique;Verbal cues for precautions/safety;Manual facilitation for weight shifting Locomotion  Ambulation Ambulation: Yes Ambulation/Gait Assistance: 4: Min assist;4: Min guard Ambulation Distance (Feet): 25 Feet (then another 40' x 2 with RW) Assistive device: None;Rolling walker Ambulation/Gait Assistance Details: Verbal cues for sequencing;Verbal cues for technique;Verbal cues for precautions/safety;Manual facilitation  for weight shifting Ambulation/Gait Assistance Details: Pt requires light min A for gait without use of RW, but note decreased endurance without use of RW and pt more steady able to increase distance with use of RW.  Gait Gait: Yes Gait Pattern: Impaired Gait Pattern: Step-through pattern;Decreased stride length;Trunk flexed;Shuffle;Wide base of support Stairs / Additional Locomotion Stairs: Yes Stairs Assistance: 3: Mod assist Stairs Assistance Details: Verbal cues for sequencing;Verbal cues for technique;Verbal cues for precautions/safety;Manual facilitation for weight shifting;Verbal cues for gait pattern;Verbal cues for safe use of DME/AE Stair Management Technique: Two rails;Step to pattern;Forwards Number of Stairs: 3 Height of Stairs: 6 Architect: Yes Wheelchair Assistance: 5: Investment banker, operational Details: Verbal cues for sequencing;Verbal cues for technique;Verbal cues for precautions/safety Wheelchair Parts Management: Needs assistance Distance: 150  Trunk/Postural Assessment  Cervical Assessment Cervical Assessment: Exceptions to [facility] Cervical Strength Overall Cervical Strength Comments: Pt with very forward flexed head/cervical spine.  Thoracic Assessment Thoracic Assessment: Exceptions to [facility] Thoracic Strength Overall Thoracic Strength Comments: Pt with forward, rounded shoulders Lumbar Assessment Lumbar  Assessment: Exceptions to [facility] Lumbar Strength Overall Lumbar Strength Comments: Pt with decreased lumbar strength, increased low back pain, and increased posterior pelvic tilt during sitting and standing.  Postural Control Postural Control: Deficits on evaluation Protective Responses: delayed Postural Limitations: Pt with decreased ankle, hip and stepping strategy during eval  Balance Balance Balance Assessed: Yes Standardized Balance Assessment Standardized Balance Assessment: Berg Balance Test (did not get to finish) Berg Balance Test Sit to Stand: Able to stand  independently using hands Standing Unsupported: Able to stand 2 minutes with supervision Sitting with Back Unsupported but Feet Supported on Floor or Stool: Able to sit safely and securely 2 minutes Stand to Sit: Controls descent by using hands Transfers: Able to transfer with verbal cueing and /or supervision Standing Ubsupported with Feet Together: Able to place feet together independently and stand for 1 minute with supervision Static Sitting Balance Static Sitting - Balance Support: Feet supported;No upper extremity supported Static Sitting - Level of Assistance: 6: Modified independent (Device/Increase time) Dynamic Sitting Balance Dynamic Sitting - Balance Support: Feet supported;No upper extremity supported Dynamic Sitting - Level of Assistance: 5: Stand by assistance;4: Min assist Static Standing Balance Static Standing - Balance Support: No upper extremity supported Static Standing - Level of Assistance: 4: Min assist;5: Stand by assistance Dynamic Standing Balance Dynamic Standing - Balance Support: During functional activity;No upper extremity supported Dynamic Standing - Level of Assistance: 4: Min assist Extremity Assessment      RLE Assessment RLE Assessment: Within Functional Limits (hip flex 2-/5 (mostly due to body habitus, but also weakness)) LLE Assessment LLE Assessment: Within Functional  Limits  FIM:  FIM - Bed/Chair Transfer Bed/Chair Transfer: 5: Supine > Sit: Supervision (verbal cues/safety issues);5: Sit > Supine: Supervision (verbal cues/safety issues);4: Bed > Chair or W/C: Min A (steadying Pt. > 75%);4: Chair or W/C > Bed: Min A (steadying Pt. > 75%) FIM - Locomotion: Wheelchair Distance: 150 Locomotion: Wheelchair: 5: Travels 150 ft or more: maneuvers on rugs and over door sills with supervision, cueing or coaxing FIM - Locomotion: Ambulation Locomotion: Ambulation Assistive Devices: Other (comment) (no AD) Ambulation/Gait Assistance: 4: Min assist;4: Min guard Locomotion: Ambulation: 1: Travels less than 50 ft with minimal assistance (Pt.>75%) FIM - Locomotion: Stairs Locomotion: Scientist, physiological: Hand rail - 2 Locomotion: Stairs: 1: Up and Down < 4 stairs with moderate assistance (Pt: 50 - 74%)   Refer to Care Plan for Long Term  Goals  Recommendations for other services: None  Discharge Criteria: Patient will be discharged from PT if patient refuses treatment 3 consecutive times without medical reason, if treatment goals not met, if there is a change in medical status, if patient makes no progress towards goals or if patient is discharged from hospital.  The above assessment, treatment plan, treatment alternatives and goals were discussed and mutually agreed upon: by patient  Denice Bors 12/16/2014, 2:26 PM

## 2014-12-16 NOTE — Progress Notes (Signed)
ANTICOAGULATION CONSULT NOTE - Initial Consult  Pharmacy Consult for Coumadin Indication: DVT  No Known Allergies  Patient Measurements: Height: 5\' 10"  (177.8 cm) IBW/kg (Calculated) : 73 Heparin Dosing Weight:    Vital Signs: Temp: 97.7 F (36.5 C) (01/13 0510) Temp Source: Oral (01/13 0510) BP: 141/81 mmHg (01/13 0510) Pulse Rate: 71 (01/13 0727)  Labs:  Recent Labs  12/16/14 0416  HGB 11.9*  HCT 36.9*  PLT 229  LABPROT 27.5*  INR 2.54*  CREATININE 0.85    Estimated Creatinine Clearance: 146.1 mL/min (by C-G formula based on Cr of 0.85).   Medical History: Past Medical History  Diagnosis Date  . Anxiety   . Morbid obesity   . Obesity hypoventilation syndrome   . Hypersomnia with sleep apnea   . Hypertension   . Stroke   . Sleep apnea   . Shortness of breath dyspnea   . Depression     Assessment: 49 y/o transferred from [place] to inpatient rehab on 1/12 after complicated hospital course, he is currently on warfarin for new DVT. INR 1.9 > 2.54, Doses of Warfarin prescribed cannot be found in [facility] notes but pt was discharged to rehab on 7.5mg  daily. hgb 11.9, plt 229k   Goal of Therapy:  INR 2-3 Monitor platelets by anticoagulation protocol: Yes   Plan:  Coumadin 5mg  po x 1 Daily INR Coumadin book/video  Bayard HuggerMei Angeliyah Kirkey, PharmD, BCPS  Clinical Pharmacist  Pager: [phone]  12/16/2014,9:06 AM

## 2014-12-16 NOTE — Discharge Instructions (Addendum)
Inpatient Rehab Discharge Instructions  Andrew Sparks Discharge date and time: 12/23/14   Activities/Precautions/ Functional Status: Activity: activity as tolerated Diet: diabetic diet Low salt Wound Care: Wash around stomach tube with soap and water. Pat dry.   Functional status:  ___ No restrictions     ___ Walk up steps independently _X__ 24/7 supervision/assistance   ___ Walk up steps with assistance ___ Intermittent supervision/assistance  ___ Bathe/dress independently ___ Walk with walker     ___ Bathe/dress with assistance ___ Walk Independently    ___ Shower independently ___ Walk with assistance    ___ Shower with assistance _X__ No alcohol     ___ Return to work/school ________  Special Instructions: 1. Take your medications as prescribed.  2. No aspirin or ibuprofen products.  3. You are pre-diabetic. Need to watch your sweets and starches. Low salt diet to avoid swelling of legs.     COMMUNITY REFERRALS UPON DISCHARGE:    Home Health:   RN  Farrell KKXFG:[phone] Date of last service:12/23/2014   Medical Equipment/Items Ordered:WIDE ROLLING WALKER, TRACH SUPPLIES, ALONG WITH HOME O2 AND PORTABLE TANK [employer] YONKERS SUCTIONS MACHINE  Agency/Supplier:[facility]    [phone] Other:[place] AND MEDICAID APPLICATIONS PENDING-SISTER TO FOLLOW UP WITH   My questions have been answered and I understand these instructions. I will adhere to these goals and the provided educational materials after my discharge from the hospital.  Patient/Caregiver Signature _______________________________ Date __________  Clinician Signature _______________________________________ Date __________  Please bring this form and your medication list with you to all your follow-up doctor's appointments.    Information on my medicine - Coumadin   (Warfarin)  This medication education was reviewed with me or my healthcare representative as part of my discharge preparation.      Why was Coumadin prescribed for you? Coumadin was prescribed for you because you have a blood clot or a medical condition that can cause an increased risk of forming blood clots. Blood clots can cause serious health problems by blocking the flow of blood to the heart, lung, or brain. Coumadin can prevent harmful blood clots from forming. As a reminder your indication for Coumadin is:   Deep Vein Thrombosis Treatment  What test will check on my response to Coumadin? While on Coumadin (warfarin) you will need to have an INR test regularly to ensure that your dose is keeping you in the desired range. The INR (international normalized ratio) number is calculated from the result of the laboratory test called prothrombin time (PT).  If an INR APPOINTMENT HAS NOT ALREADY BEEN MADE FOR YOU please schedule an appointment to have this lab work done by your health care provider within 7 days. Your INR goal is usually a number between:  2 to 3.  What  do you need to  know  About  COUMADIN? Take Coumadin (warfarin) exactly as prescribed by your healthcare provider about the same time each day.  DO NOT stop taking without talking to the doctor who prescribed the medication.  Stopping without other blood clot prevention medication to take the place of Coumadin may increase your risk of developing a new clot or stroke.  Get refills before you run out.  What do you do if you miss a dose? If you miss a dose, take it as soon as you remember on the same day then continue your regularly scheduled regimen the next day.  Do not take two doses of Coumadin at the same time.  Important Safety Information A possible side effect of Coumadin (Warfarin) is an increased risk of bleeding. You should call your healthcare provider right away if you experience any of the following: ? Bleeding from an injury or your nose that does not stop. ? Unusual colored urine (red or dark brown) or unusual colored stools (red or  black). ? Unusual bruising for unknown reasons. ? A serious fall or if you hit your head (even if there is no bleeding).  Some foods or medicines interact with Coumadin (warfarin) and might alter your response to warfarin. To help avoid this: ? Eat a balanced diet, maintaining a consistent amount of Vitamin K. ? Notify your provider about major diet changes you plan to make. ? Avoid alcohol or limit your intake to 1 drink for women and 2 drinks for men per day. (1 drink is 5 oz. wine, 12 oz. beer, or 1.5 oz. liquor.)  Make sure that ANY health care provider who prescribes medication for you knows that you are taking Coumadin (warfarin).  Also make sure the healthcare provider who is monitoring your Coumadin knows when you have started a new medication including herbals and non-prescription products.  Coumadin (Warfarin)  Major Drug Interactions  Increased Warfarin Effect Decreased Warfarin Effect  Alcohol (large quantities) Antibiotics (esp. Septra/Bactrim, Flagyl, Cipro) Amiodarone (Cordarone) Aspirin (ASA) Cimetidine (Tagamet) Megestrol (Megace) NSAIDs (ibuprofen, naproxen, etc.) Piroxicam (Feldene) Propafenone (Rythmol SR) Propranolol (Inderal) Isoniazid (INH) Posaconazole (Noxafil) Barbiturates (Phenobarbital) Carbamazepine (Tegretol) Chlordiazepoxide (Librium) Cholestyramine (Questran) Griseofulvin Oral Contraceptives Rifampin Sucralfate (Carafate) Vitamin K   Coumadin (Warfarin) Major Herbal Interactions  Increased Warfarin Effect Decreased Warfarin Effect  Garlic Ginseng Ginkgo biloba Coenzyme Q10 Green tea St. Johns wort    Coumadin (Warfarin) FOOD Interactions  Eat a consistent number of servings per week of foods HIGH in Vitamin K (1 serving =  cup)  Collards (cooked, or boiled & drained) Kale (cooked, or boiled & drained) Mustard greens (cooked, or boiled & drained) Parsley *serving size only =  cup Spinach (cooked, or boiled & drained) Swiss chard  (cooked, or boiled & drained) Turnip greens (cooked, or boiled & drained)  Eat a consistent number of servings per week of foods MEDIUM-HIGH in Vitamin K (1 serving = 1 cup)  Asparagus (cooked, or boiled & drained) Broccoli (cooked, boiled & drained, or raw & chopped) Brussel sprouts (cooked, or boiled & drained) *serving size only =  cup Lettuce, raw (green leaf, endive, romaine) Spinach, raw Turnip greens, raw & chopped   These websites have more information on Coumadin (warfarin):  FailFactory.se; VeganReport.com.au;    Tracheostomy Tube Safety A tracheostomy tube (commonly known as trach tube) allows a person to breath without using his or her nose or mouth. A trach tube may be needed if:  A person's airway is blocked by swelling, injury, tumor, foreign body, vocal cord problem, or severe narrowing of the trachea.  A person needs long-term ventilation.  A person has excess airway secretions requiring frequent suctioning. If you have a trach, you must follow certain safety measures to keep yourself safe and free of infection. SAFETY MEASURES  Always carry your emergency travel kit with you when you leave the house. Your bag should include:  A portable suction machine.  Suction catheters.  A mucus trap.  A bulb syringe.  Two trach tubes (one the same size and one smaller).  [place].  Heat and moisture exchanger.  Emergency phone numbers.  Sterile water.  0.9% saline solution.  Sterile gloves or  hand sanitizer.  Sterile gauze pads.  Clean your stoma site as directed to prevent infection.  Secure the trach tube exactly as directed to keep the tube from moving out of place.  Suction the trach tube as often as directed and exactly as directed.  Avoid dust, mold, tobacco smoke, other types of smoke, and fumes from cleaning solutions, such as ammonia or bleach.  Cover your trach tube when using any kind of spray product or powder. It is  important that you do not inhale the mist or powder.  Do not sterilize plastic tubes or attempt to clean them in boiling water. They are to be used only once.  Do not store replacement plastic trach tubes in a location in which the temperature is over 118 F (48 C).  If you have a cuffed trach tube, do not over inflate the cuff. This can injure your trachea. It may also cause the cuff to extend past the end of the tube where it can restrict or block air flow.  If you cannot remove your trach tube or the smaller tube that fits inside the trach tube (inner cannula), do not force it. Call your caregiver.  Use a humidifier at home to keep some moisture in the air and to prevent your airway and lungs from drying out. Clean the humidifier regularly to prevent buildup of mold and mildew.  Do not put anything in your trach tube that should not be there.  Keep your stoma and trach tube dry when you bathe or shower. Direct the shower spray at chest level and place a shower shield or protective covering over your trach tube.  Keep clothing away from the trach tube except for a protective scarf. Clothing may block the trach tube. Avoid crew necks and turtlenecks. Wear v-neck shirts and open collar shirts or blouses. Do not wear clothes that shed fibers or lint.  If going outside in very cold air, wear a filter to prevent cold air from entering the trach tube. You can also loosely cover the trach tube with a protective scarf, handkerchief, or gauze. This helps to warm the air as you breathe, so that the cold air does not irritate your trachea and lungs. It also helps to keep out dust or dirt on windy days.  Sickness:  Suction more frequently is you become sick.  Drink enough fluids to keep your urine clear or pale yellow if you have a fever, vomiting, or diarrhea.  If you vomit, cover your trach tube with a towel to keep vomit out of your airway. If you think vomit may have entered the trach tube,  suction right away.  Post CPR instructions and emergency numbers where they can be seen in an emergency. All of your caregivers must know CPR.  If you use a ventilator:  Routinely check the ventilator safety and sound alarms to be sure they work properly.  Be sure the ventilator tubes are properly placed so that they do not pull on the trach tube.  Do not twist or pull on the trach connector more than needed. This may cause discomfort or disconnect the ventilator tubes.  Hold the trach tube in place when hooking up or disconnecting the ventilator or humidification tubing.  Always use an inner cannula without side openings (non-fenestrated) with the correct connector if the trach tube has one or more side openings (fenestrated trach tube).  Obtain ongoing support as needed to adjust to living with trach tube. [place]  IF:   You have difficulty breathing even after suctioning and cleaning.  You have swelling, redness, warmth, drainage, or tenderness around the stoma.  You have a fever or persistent symptoms for more than 2 to 3 days.  You have a fever and your symptoms suddenly get worse.  You have chills or muscle aches.  You have nausea and vomiting.  You feel dizzy or feel faint.  You have difficulty swallowing.  You have unusual sounds coming from the airway or continue to cough after suctioning.  You have chest pain or have difficulty breathing (despite a clean and properly placed tube).  You have bleeding from the stoma.  You have bright red blood in your mucus.  Your tube becomes plugged and you cannot clear it.  Your tube falls out and cannot be reinserted. Document Released: 08/14/2012 Document Reviewed: 08/14/2012 [facility] Patient Information 2015 [place]. This information is not intended to replace advice given to you by your health care provider. Make sure you discuss any questions you have with your health care provider.

## 2014-12-16 NOTE — Progress Notes (Signed)
Occupational Therapy Assessment and Plan  Patient Details  Name: Andrew Sparks MRN: [id number] Date of Birth: 09-21-1966  OT Diagnosis: abnormal posture, cognitive deficits and muscle weakness (generalized) Rehab Potential: Rehab Potential (ACUTE ONLY): Good ELOS: 6-7 days   Today's Date: 12/16/2014 OT Individual Time: 2458-0998 OT Individual Time Calculation (min): 60 min     Problem List:  Patient Active Problem List   Diagnosis Date Noted  . 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 Debility 12/16/2014  . Obesity hypoventilation syndrome 12/16/2014  . Encephalopathy pulmonary 12/15/2014    Past Medical History:  Past Medical History  Diagnosis Date  . Anxiety   . Morbid obesity   . Obesity hypoventilation syndrome   . Hypersomnia with sleep apnea   . Hypertension   . Stroke   . Sleep apnea   . Shortness of breath dyspnea   . Depression    Past Surgical History: No past surgical history on file.  Assessment & Plan Clinical Impression: Patient is a 49 y.o. morbidly obese male with h/o OSA/OHS, hypersomnia, COPD with ongoing tobacco use, CHF, HTN who was admitted to [facility] on 33/82/50 with metabolic acidosis and decrease in LOC with encephalopathy due to COPD exacerbation with acute diastolic CHF. He declined in ED requiring intubation and was started on IV solumedrol, IV diuretics for fluid overload as well as IV antibiotics for strep PNA.He was reintubated and Hospital course complicated by elevated cardiac enzymes due to medical issues, accelerated HTN requiring multiple medications for control, SVT as well as development of right brachial DVT. He was started on IV heparin for DVT and transition to coumadin. He had difficulty weaning off vent and required PEG placement on 12/01/14 as well as trach by ENT on 12/07/14. He was started on tube feeds but developed N/V due to ileus which has resolved. He has been unable to tolerate finger  occlusion therefore PMSV not attempted by ST. His trach was downsized to #6 and ENT recommends continuing this till weight loss due to his non-compliance with CPAP. Therapy initiated and patient noted to be deconditioned. He is making excellent progress with physical therapy and CIR recommended to maximize functional return as well as family education on trach care.   Patient has a long history of hypersomnolence. Following sleep while sitting up. Noncompliance with medications. Poor medical follow-up. Complicated social situation .  Patient transferred to CIR on 12/15/2014 .    Patient currently requires Min - Mod A with basic self-care skills secondary to muscle weakness, decreased cardiorespiratoy endurance and decreased oxygen support, decreased dynamic balance and decreased sitting balance, decreased standing balance, decreased postural control and decreased balance strategies.  Prior to hospitalization, patient could complete ADLs and IADLs with independent .  Patient will benefit from skilled intervention to decrease level of assist with basic self-care skills prior to discharge home with care partner.  Anticipate patient will require 24 hour supervision and follow up home health.    Skilled Therapeutic Intervention Upon entering the room, pt seated on EOB sleeping upon entering the room. Pt reporting no c/o pain this session. OT educated pt on OT purpose, POC, and goals this session. Pt verbalizing understanding. Pt engaging in bathing and dressing seated on EOB with Min A for standing balance without AD for LB clothing management. Pt transferred to recliner chair with min A stand pivot transfer from EOB. Pt with increased fatigue and requiring multiple rest breaks during session. Pt able to verbalize the need  to press RN call bell for assistance out of chair or to bathroom. Pt remained seated in recliner chair with B LEs elevated and call bell within reach upon exiting the room.    OT  Evaluation Precautions/Restrictions  Precautions Precautions: Fall;Other (comment) Precaution Comments: trach- monitor O2 levels Restrictions Weight Bearing Restrictions: No General Chart Reviewed: Yes Vital Signs Therapy Vitals Pulse Rate: 73 Resp: 20 BP: (!) 170/94 mmHg Patient Position (if appropriate): Sitting Oxygen Therapy SpO2: 99 % O2 Device: Not Delivered;Tracheostomy Collar Pain Pain Assessment Pain Assessment: No/denies pain Home Living/Prior Functioning Home Living Family/patient expects to be discharged to:: Private residence Living Arrangements: Other relatives Vision/Perception  Vision- History Baseline Vision/History: Wears glasses Wears Glasses: Reading only Patient Visual Report: No change from baseline Vision- Assessment Vision Assessment?: No apparent visual deficits  Cognition Arousal/Alertness: Awake/alert Orientation Level: Oriented to place;Disoriented to situation Attention: Selective Selective Attention: Appears intact Memory: Impaired Memory Impairment: Decreased recall of new information;Decreased short term memory Awareness: Impaired Awareness Impairment: Anticipatory impairment Problem Solving: Appears intact Safety/Judgment: Appears intact Comments: Pt able to voice that he needs to call for assist prior to getting up out of chair.  Sensation Sensation Light Touch: Appears Intact Stereognosis: Not tested Hot/Cold: Appears Intact Proprioception: Appears Intact Coordination Gross Motor Movements are Fluid and Coordinated: Yes Fine Motor Movements are Fluid and Coordinated: Yes Heel Shin Test: unable to test, limited by body habitus and also swelling in BLEs.  Motor  Motor Motor: Abnormal postural alignment and control Motor - Skilled Clinical Observations: decreased dynamic balance and decreased endurance Mobility  Bed Mobility Bed Mobility: Supine to Sit;Sit to Supine Supine to Sit: 5: Supervision Supine to Sit Details:  Verbal cues for sequencing;Verbal cues for technique;Verbal cues for precautions/safety Sit to Supine: 5: Supervision Sit to Supine - Details: Verbal cues for sequencing;Verbal cues for technique;Verbal cues for precautions/safety Transfers Transfers: Sit to Stand;Stand to Sit Sit to Stand: 4: Min assist Sit to Stand Details: Verbal cues for sequencing;Verbal cues for technique;Verbal cues for precautions/safety;Manual facilitation for weight shifting Stand to Sit: 4: Min assist Stand to Sit Details (indicate cue type and reason): Verbal cues for sequencing;Verbal cues for technique;Verbal cues for precautions/safety;Manual facilitation for weight shifting  Trunk/Postural Assessment  Cervical Assessment Cervical Assessment: Exceptions to [facility] Cervical Strength Overall Cervical Strength Comments: forward head Thoracic Assessment Thoracic Assessment: Exceptions to [facility] Thoracic Strength Overall Thoracic Strength Comments: rounded shoulders Lumbar Assessment Lumbar Assessment: Exceptions to [facility] Lumbar Strength Overall Lumbar Strength Comments: posterior pelvic tilt Postural Control Postural Control: Deficits on evaluation Protective Responses: delayed Postural Limitations: Pt with decreased ankle, hip and stepping strategy during eval  Balance Balance Balance Assessed: Yes Standardized Balance Assessment Standardized Balance Assessment: Berg Balance Test (did not get to finish) Berg Balance Test Sit to Stand: Able to stand  independently using hands Standing Unsupported: Able to stand 2 minutes with supervision Sitting with Back Unsupported but Feet Supported on Floor or Stool: Able to sit safely and securely 2 minutes Stand to Sit: Controls descent by using hands Transfers: Able to transfer with verbal cueing and /or supervision Standing Ubsupported with Feet Together: Able to place feet together independently and stand for 1 minute with supervision Static Sitting Balance Static  Sitting - Balance Support: Feet supported;No upper extremity supported Static Sitting - Level of Assistance: 6: Modified independent (Device/Increase time) Dynamic Sitting Balance Dynamic Sitting - Balance Support: Feet supported;No upper extremity supported Dynamic Sitting - Level of Assistance: 4: Min assist Static Standing Balance Static Standing -  Balance Support: No upper extremity supported Static Standing - Level of Assistance: 4: Min assist Dynamic Standing Balance Dynamic Standing - Balance Support: During functional activity;No upper extremity supported Dynamic Standing - Level of Assistance: 4: Min assist Extremity/Trunk Assessment RUE Assessment RUE Assessment: Within Functional Limits LUE Assessment LUE Assessment: Within Functional Limits  FIM:  FIM - Grooming Grooming Steps: Wash, rinse, dry face;Wash, rinse, dry hands;Oral care, brush teeth, clean dentures Grooming: 5: Set-up assist to obtain items FIM - Bathing Bathing Steps Patient Completed: Chest;Right Arm;Left Arm;Abdomen;Front perineal area;Buttocks;Right upper leg;Left upper leg Bathing: 4: Min-Patient completes 8-9 32f 10 parts or 75+ percent FIM - Upper Body Dressing/Undressing Upper body dressing/undressing: 0: Wears gown/pajamas-no public clothing FIM - Lower Body Dressing/Undressing Lower body dressing/undressing steps patient completed: Thread/unthread right pants leg;Thread/unthread left pants leg;Pull pants up/down Lower body dressing/undressing: 3: Mod-Patient completed 50-74% of tasks FIM - Tub/Shower Transfers Tub/shower Transfers: 0-Activity did not occur or was simulated   Refer to Care Plan for Long Term Goals  Recommendations for other services: None  Discharge Criteria: Patient will be discharged from OT if patient refuses treatment 3 consecutive times without medical reason, if treatment goals not met, if there is a change in medical status, if patient makes no progress towards goals or if  patient is discharged from hospital.  The above assessment, treatment plan, treatment alternatives and goals were discussed and mutually agreed upon: by patient  Phineas Semen 12/16/2014, 11:57 AM

## 2014-12-16 NOTE — Progress Notes (Signed)
Pt. Received the Coumadin book,he did not have his reading glasses with him.RN showed video 109 about blood thinners on the  system.Keep educating pt. And assessing his needs.

## 2014-12-16 NOTE — Progress Notes (Signed)
Pt has been desating to 84% with cont pulse ox monitoring with room air humidified air per trach collar. Noted sleep apnea type breathing pattern. resp therapy called and pt ref offer for deep suctioning; able to cough and use oral suction for mod amt secretions. Denies dyspnea or sob when  Asked. Did monior sleeping and counted 5 separate episodes of desat to 84% in 5 min period of time. After discussion with charge RN did change to 28%o2 per trach collar and no change noted in periods of desat. resp therapist notified. Cont close monitoring tonight. Periods of desat are brief. [facility] RN

## 2014-12-16 NOTE — Progress Notes (Signed)
[facility] Admission Coordinator Signed Physical Medicine and Rehabilitation PMR Pre-admission 12/11/2014 4:02 PM  Related encounter: Documentation from 12/11/2014 in [place] Collapse All     [employer] PMR Admission Coordinator Pre-Admission Assessment  Patient: Andrew Sparks is an 49 y.o., male MRN: [id number] DOB: May 21, 1966 Height: 5\' 8"  (172.7 cm) Weight: 133.358 kg (294 lb)  Insurance Information Pt is self pay. Pt's sister has started the Medicaid and disability process.  Emergency Contact Information Contact Information    Name Relation Home Work [place] Sister   [phone]   Andrew Sparks, Andrew Sparks [phone]        Current Medical History  Patient Admitting Diagnosis: acute respiratory failure with acute encephalopathy History of Present Illness: Andrew Sparks is a 49 year old morbidly obese male with h/o OSA/OHS, COPD with ongoing tobacco use, CHF, HTN who was admitted to [facility] on 09/62/83 with metabolic acidosis and decrease in LOC due to COPD exacerbation with acute diastolic CHF. He required intubation past admission and self extubated briefly. He was reintubated and treated with IV solumedrol, IV diuretics for fluid overload as well as IV antibiotics for strep PNA. Hospital course complicated by elevated cardiac enzymes due to medical issues, accelerated HTN requiring multiple medications for control, SVT as well as development of right brachial DVT. He was started on IV heparin for DVT and transition to coumadin. He had difficulty weaning off vent and required PEG placement on 12/01/14 as well as trach by ENT on 12/07/14. He was started on tube feeds but developed N/V due to ileus and has now progressed to a mechanical soft diet with thin liquids. ENT modified trach to Shiley #6 on 12-14-14. Pt has made excellent progress with physical therapy and is  currently on 28% trach collar. He and his family are motivated to come to inpatient rehab to maximize his functional return.   Patient's medical record from [facility] has been reviewed by the rehabilitation admission coordinator and physician.  Past Medical History  HTN, morbid obesity, bipolar, anxiety, tobacco abuse, HLP   Family History  family history is not on file.  Prior Rehab/Hospitalizations: Pt has had no previous rehab except was admitting to [facility] on 11-16-14 with respiratory distress. He has received acute therapy services during this hospitalization at [facility].  Current Medications See MAR  Patients Current Diet: Mechanical soft with thin liquids as of 12-15-14  Precautions / Restrictions Precautions Precautions: Fall, Other (comment) (trach collar) Precaution Comments: Monitor O2 levels Restrictions Weight Bearing Restrictions: No   Prior Activity Level Community (5-7x/wk): Pt was independent prior to admit but had significant sleep apnea. Chart indicates that pt would fall asleep while standing upright, even during cooking or smoking. Pt enjoyed walking outside and watching TV. Pt's sister did share her significant concerns that pt literally falls asleep while standing, cooking or smoking. She has concerns that her home may not be safe if pt forgets that the stove is on/is smoking.  Pt moved back to [place] from the [place] area in the fall. While in [place], pt was working at a [employer] working night shift. He had not been working while here in [place].   Home Assistive Devices / Equipment Home Assistive Devices/Equipment: None   Prior Functional Level Current Functional Level  Bed Mobility  Independent  Mod assist (use of bed rail and due to obese body stature) Note: pt does not like to lie back  in bed and prefers to sleep sitting up in reciner.  Transfers  Independent  Min assist (with and without bariatric rolling walker)     Mobility - Walk/Wheelchair  Independent  Min assist (CGA to min assist to ambulate 10' intervals w/ & w/out FWW)   Upper Body Dressing  Independent  Other (not assessed, anticipate needs)   Lower Body Dressing  Independent  Other (not assessed, anticipate needs)   Grooming  Independent  Other (not assessed, anticipate needs)   Eating/Drinking  Independent  Other (pt is currently NPO due to trach issues, not assessed)   Toilet Transfer  Independent  Min assist (currently using BSC or bedpan)   Bladder Continence   WFL  using urinal   Bowel Management  WFL   last BM on 12-14-14   Stair Climbing   Independent  Other (not assessed at this time, anticipate needs)   Communication  [facility]  pt is mouthing words appropriately or writing as needed. He does not have a speaking valve yet.   Memory  WFL  pt says his thinking is a bit "off", unable to clearly assess at this time.   Cooking/Meal Prep  Independent     Housework  Independent    Money Management  Independent    Driving   Pt relied on the bus for transportation     Special needs/care consideration BiPAP/CPAP no  CPM no  Continuous Drip IV no  Dialysis no  Life Vest no  Oxygen - currently on 28% trach collar Special Bed - pt prefers to sleep sitting up in chair and does not like to lay back Trach Size - Shiley #6 Wound Vac (area) no  Skin - no issues  Bowel mgmt: last BM on 12-14-14 Bladder mgmt: currently using urinal Diabetic mgmt - pt questions if he is diabetic but was not receiving treatment for it prior to admit  Previous Home Environment Living Arrangements: Other relatives (pt is living with his sister) Lives With: Family Available Help at Discharge: Family Type of Home: House Home Layout: One level Home Access: Stairs to enter Entrance Stairs-Rails: None Entrance Stairs-Number of Steps:  1 Additional Comments: Pt's sister shared concerns over the severity of pt's sleep apnea and states that pt would fall asleep standing up. She has concerns about pt doing cooking tasks and falling asleep in the middle of the task. Chart from [facility] also indicated that pt had been living with his sister but was also at a homeless shelter recently due to her safety concerns. Sister stated she is willing to take pt back to live with her but does have concerns over her safety with his tendency to fall asleep while smoking/cooking.  Discharge Living Setting Plans for Discharge Living Setting: Lives with (comment) (lives with his sister- see above comments as well) Type of Home at Discharge: House Discharge Home Layout: One level Discharge Home Access: Stairs to enter Entrance Stairs-Rails: None Entrance Stairs-Number of Steps: 1 Does the patient have any problems obtaining your medications?: (pt relies on bus transportation but sister drives)  Social/Family/Support Systems Patient Roles: Other (Comment) (pt has a 98 year old son and lives with his sister. Pt's brother will also be available to help.) Contact Information: sister Andrew Sparks is primary contact Anticipated Caregiver: Sister and son (sister works as a Quarry manager at [employer], pt's son is not currently working and can help pt during the day. Pt's brother is also planning to help as needed.) Anticipated Caregiver's Contact Information: see above  Ability/Limitations of Caregiver: sister works but son is available  Careers adviser: 24/7 (24-7 between sister and pt's brother and pt's son) Does Caregiver/Family have Issues with Lodging/Transportation while Pt is in Rehab?: No  Goals/Additional Needs Patient/Family Goal for Rehab: Supervision to Mod Ind with PT, OT and SLP Expected length of stay: 14-18 days Cultural Considerations: none Equipment Needs: to be determined Pt/Family Agrees to Admission and willing to participate: Yes (I spoke  with pt's sister by phone on 12-14-14.) Program Orientation Provided & Reviewed with Pt/Caregiver Including Roles & Responsibilities: Yes  Patient Condition: I met with this patient at [facility] on 12-10-14 and 12-15-14 to discuss the possibility of inpatient rehab and he is strongly motivated to maximize his functional return following this involved medical course of respiratory failure. This 49 year old patient was previously independent and had limitations due to his significant sleep apnea. Pt currently has a trach on 28% O2 and is needing minimal assistance with limited mobility at this time and has self care needs as well. He will benefit greatly from the multi-disciplinary team of skilled PT, OT, SLP and rehab nursing to maximize his functional return after this involved medical course. PT, OT and rehab nursing will focus on increasing strength for greater independence in bed mobility, transfers, gait and self care skills. Pt will benefit from further skilled speech services to determine diet advancement and voice training as well as to determine any higher level cognitive needs. In addition, rehab nursing can focus on pt/family teaching with education on trach management, bowel and bladder issues, sleep apnea education and medication management. In addition, pt will benefit from rehab physician intervention to monitor his complex medical and respiratory status as well as sleep apnea issues. Discussed case with Dr. Naaman Plummer who stated that pt is a good inpatient rehab candidate and received medical clearance from Dr. Posey Pronto at [facility]. Pt and his sister are motivated to come to inpatient rehab and will benefit from the intensive services of skilled therapy under rehab physician guidance. Pt will be admitted today on 12-15-14.   Preadmission Screen Completed By: Anne Hahn, 12/15/2014 11:17 AM ______________________________________________________________________  Discussed status with Dr. Naaman Plummer on  12-15-14 at 67 and received telephone approval for admission today.  Admission Coordinator: Nanetta Batty, PT, time 1106/Date 12-15-14   Assessment/Plan: Diagnosis: acute encephalopathy after respiratory failure 1. Does the need for close, 24 hr/day Medical supervision in concert with the patient's rehab needs make it unreasonable for this patient to be served in a less intensive setting? Yes 2. Co-Morbidities requiring supervision/potential complications: respiratory failure/trach, resolving ileus. Chf, htn 3. Due to bladder management, bowel management, safety, skin/wound care, disease management, medication administration, pain management and patient education, does the patient require 24 hr/day rehab nursing? Yes 4. Does the patient require coordinated care of a physician, rehab nurse, PT (1-2 hrs/day, 5 days/week), OT (1-2 hrs/day, 5 days/week) and SLP (1-2 hrs/day, 5 days/week) to address physical and functional deficits in the context of the above medical diagnosis(es)? Yes Addressing deficits in the following areas: balance, endurance, locomotion, strength, transferring, bowel/bladder control, bathing, dressing, feeding, grooming, toileting, cognition, language, swallowing and psychosocial support 5. Can the patient actively participate in an intensive therapy program of at least 3 hrs of therapy 5 days a week? Yes 6. The potential for patient to make measurable gains while on inpatient rehab is excellent 7. Anticipated functional outcomes upon discharge from inpatients are: modified independent and supervision PT, modified independent and supervision OT, modified independent and supervision  SLP 8. Estimated rehab length of stay to reach the above functional goals is: 14-18 days 9. Does the patient have adequate social supports to accommodate these discharge functional goals? Yes 10. Anticipated D/C setting: Home 11. Anticipated post D/C treatments: [facility] therapy 12. Overall  Rehab/Functional Prognosis: excellent    RECOMMENDATIONS: This patient's condition is appropriate for continued rehabilitative care in the following setting: CIR Patient has agreed to participate in recommended program. Yes Note that insurance prior authorization may be required for reimbursement for recommended care.  Comment: Admitting to inpatient rehab today.  Meredith Staggers, MD, [facility] 12/15/2014   Nanetta Batty, PT 12/15/2014      Cosigned by: Meredith Staggers, MD at 12/15/2014 11:50 AM  Revision History     Date/Time User Provider Type Action   12/15/2014 11:50 AM Meredith Staggers, MD Physician Cosign   12/15/2014 11:29 AM Meredith Staggers, MD Physician Sign   12/15/2014 11:18 AM [facility] Admission Coordinator Share   View Details Report

## 2014-12-16 NOTE — Patient Care Conference (Signed)
Inpatient RehabilitationTeam Conference and Plan of Care Update Date: 12/16/2014   Time: 11;30 AM    Patient Name: Andrew Sparks      Medical Record Number: [id number]  Date of Birth: 29-Dec-1965 Sex: Male         Room/Bed: 4W12C/4W12C-01 Payor Info: Payor: /    Admitting Diagnosis: RESP FAILURE  TRACH  SIG SLEEP APNEA   Admit Date/Time:  12/15/2014  2:35 PM Admission Comments: No comment available   Primary Diagnosis:  <principal problem not specified> Principal Problem: <principal problem not specified>  Patient Active Problem List   Diagnosis Date Noted  . 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 Debility 12/16/2014  . Obesity hypoventilation syndrome 12/16/2014  . Encephalopathy pulmonary 12/15/2014    Expected Discharge Date: Expected Discharge Date: 12/22/14  Team Members Present: Physician leading conference: Dr. Claudette Laws Social Worker Present: Dossie Der, LCSW Nurse Present: Carmie End, RN PT Present: Edman Circle, PT;Patsye Sullivant Evelina Bucy, PT OT Present: Perrin Maltese, Domenic Schwab, OT SLP Present: Fae Pippin, SLP PPS Coordinator present : Tora Duck, RN, CRRN     Current Status/Progress Goal Weekly Team Focus  Medical   Chronic hypoxia from obesity hypoventilation syndrome, chronic deconditioning with morbid obesity.  Maintain medical stability, train family with tracheostomy and gastrostomy care  Assessment fluid intake and need for  H2O flush   Bowel/Bladder   Continent to bowel and bladder.  To remain continent to bowel and bladder.  To conticue continent to bowel and bladder.   Swallow/Nutrition/ Hydration   Dys.3 thin with full staff supervision   least restrictive PO intake Mod I   trials of regular    ADL's     eval pending        Mobility     eval pending        Communication   Min assist   Mod I   PMSV education and carryover   Safety/Cognition/ Behavioral Observations  high level TBA  Supervision    ongoing diagnostic assesment/treatment   Pain   No complain about pain.  To keep pain level less than 3,on scale 1 to 10.  To monitor pain levels Q 2 hrs. and PRN.   Skin   Skin dry and intact.Pt. has a trach, and PEG tube.  To keep skin dry and intact.  To continue monitoring skin Q shift and PRN.      *See Care Plan and progress notes for long and short-term goals.  Barriers to Discharge: See above    Possible Resolutions to Barriers:  See above    Discharge Planning/Teaching Needs:    Home with sister and other family memebrs to assist him.  Will need much education regarding trach and peg prior to discharge.  Begin SSD and Medicaid applications and hook him into the medical clinic in [place]     Team Discussion:  New eval-moving well-goals supervision level-may downsize trach to a size 4 prior to discharge. Will need much education before discharge.  Revisions to Treatment Plan:  New eval   Continued Need for Acute Rehabilitation Level of Care: The patient requires daily medical management by a physician with specialized training in physical medicine and rehabilitation for the following conditions: Daily direction of a multidisciplinary physical rehabilitation program to ensure safe treatment while eliciting the highest outcome that is of practical value to the patient.: Yes Daily medical management of patient stability for increased activity during participation in an intensive rehabilitation regime.: Yes Daily  analysis of laboratory values and/or radiology reports with any subsequent need for medication adjustment of medical intervention for : Neurological problems;Other;Pulmonary problems;Cardiac problems;Post surgical problems  [name], [name] 12/17/2014, 9:19 AM

## 2014-12-16 NOTE — Progress Notes (Signed)
Patient information reviewed and entered into eRehab system by Gatlyn Lipari, RN, CRRN, PPS Coordinator.  Information including medical coding and functional independence measure will be reviewed and updated through discharge.    
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
## 2014-12-16 NOTE — Plan of Care (Signed)
Problem: RH SAFETY Goal: RH OTHER STG SAFETY GOALS W/ASSIST Other STG Safety Goals With Assistance.  Outcome: Progressing Trach care,PEG tube care.  Problem: RH KNOWLEDGE DEFICIT Goal: RH STG INCREASE KNOWLEDGE OF HYPERTENSION Outcome: Progressing Educated about high BP,the need for medication and complain with the med. Goal: RH STG INCREASE KNOWLEDGE OF DYSPHAGIA/FLUID INTAKE Initiated education about trach care.

## 2014-12-16 NOTE — Progress Notes (Signed)
INITIAL NUTRITION ASSESSMENT  DOCUMENTATION CODES Per approved criteria  -Morbid Obesity   INTERVENTION: Provide Glucerna Shake po BID, each supplement provides 220 kcal and 10 grams of protein.  Continue free water flushes.  Encourage adequate PO intake.  NUTRITION DIAGNOSIS: Inadequate oral intake related to decreased appetite as evidenced by meal completion of 0-40%.   Goal: Pt to meet >/= 90% of their estimated nutrition needs   Monitor:  PO intake, weight trends, labs, I/O's  Reason for Assessment: MST  49 y.o. male  Admitting Dx: Encephalopathy Pulmonary  ASSESSMENT: Morbidly obese male with h/o OSA/OHS, hypersomnia, COPD with ongoing tobacco use, CHF, HTN who was admitted to [facility] on 11/16/14 with metabolic acidosis and decrease in LOC with encephalopathy due to COPD exacerbation with acute diastolic CHF.  Pt reports having a decreased appetite. Meal completion is 0-40%. Pt reports he has been trying to get use to eating again as he used to get tube feedings via PEG. RD will order Glucerna Shake to aid in caloric and protein needs as po intake has been inadequate. Pt was encouraged to eat his food at meals. RD was additionally consulted for weight loss education.   Pt with no observed significant fat or muscle mass loss.  Labs: Low sodium, potassium, chloride.  Height: Ht Readings from Last 1 Encounters:  12/15/14 5\' 10"  (1.778 m)    Weight: Wt Readings from Last 1 Encounters:  12/11/14 294 lb (133.358 kg)    Ideal Body Weight: 166 lbs  % Ideal Body Weight: 177%  Wt Readings from Last 10 Encounters:  12/11/14 294 lb (133.358 kg)    Usual Body Weight: 283 lbs (Pt reports PTA)  % Usual Body Weight: 104%  BMI:  Body Mass Index: 42.49 kg/(m^2) Morbid obesity  Estimated Nutritional Needs: Kcal: 2200-2400 Protein: 120-140 grams Fluid: 2.2 - 2.4 L/day  Skin: non-pitting LUE, +1 LE edema  Diet Order: DIET DYS 3  EDUCATION NEEDS: -Education needs  addressed   Intake/Output Summary (Last 24 hours) at 12/16/14 1027 Last data filed at 12/16/14 0556  Gross per 24 hour  Intake    200 ml  Output      0 ml  Net    200 ml    Last BM: 1/13  Labs:   Recent Labs Lab 12/16/14 0416  NA 133*  K 3.2*  CL 95*  CO2 29  BUN 8  CREATININE 0.85  CALCIUM 8.7  GLUCOSE 113*    CBG (last 3)   Recent Labs  12/15/14 1649 12/15/14 2039 12/16/14 0705  GLUCAP 103* 118* 112*    Scheduled Meds: . citalopram  20 mg Oral Daily  . cloNIDine  0.1 mg Oral TID  . famotidine  20 mg Oral BID  . free water  200 mL Per Tube 3 times per day  . furosemide  40 mg Oral Daily  . hydrALAZINE  100 mg Oral 3 times per day  . Influenza vac split quadrivalent PF  0.5 mL Intramuscular Tomorrow-1000  . insulin aspart  0-5 Units Subcutaneous QHS  . insulin aspart  0-9 Units Subcutaneous TID WC  . lisinopril  40 mg Oral BID  . magnesium oxide  200 mg Oral BID  . metoprolol tartrate  50 mg Oral BID  . nitroGLYCERIN  1 inch Topical BID  . potassium chloride  20 mEq Oral TID  . scopolamine  1 patch Transdermal Q72H  . warfarin  5 mg Oral ONCE-1800  . Warfarin - Pharmacist Dosing Inpatient  Does not apply q1800    Continuous Infusions:   Past Medical History  Diagnosis Date  . Anxiety   . Morbid obesity   . Obesity hypoventilation syndrome   . Hypersomnia with sleep apnea   . Hypertension   . Stroke   . Sleep apnea   . Shortness of breath dyspnea   . Depression     No past surgical history on file.  Marijean Niemann, MS, RD, LDN Pager # [phone] After hours/ weekend pager # [phone]

## 2014-12-16 NOTE — Evaluation (Signed)
Speech Language Pathology Assessment and Plan  Patient Details  Name: Andrew Sparks MRN: [id number] Date of Birth: 02-23-1966  SLP Diagnosis: Dysphagia;Voice disorder  Rehab Potential: Excellent ELOS: 1/19    Today's Date: 12/16/2014 SLP Individual Time: 7371-7307 SLP Individual Time Calculation (min): 60 min   Problem List:  Patient Active Problem List   Diagnosis Date Noted  . 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 Debility 12/16/2014  . Obesity hypoventilation syndrome 12/16/2014  . Encephalopathy pulmonary 12/15/2014   Past Medical History:  Past Medical History  Diagnosis Date  . Anxiety   . Morbid obesity   . Obesity hypoventilation syndrome   . Hypersomnia with sleep apnea   . Hypertension   . Stroke   . Sleep apnea   . Shortness of breath dyspnea   . Depression    Past Surgical History: No past surgical history on file.  Assessment / Plan / Recommendation Clinical Impression Andrew Sparks is a 49 year old morbidly obese male with history of OSA/OHS, hypersomnia, COPD with ongoing tobacco use, CHF, HTN who was admitted to [facility] on 11/16/14 with metabolic acidosis and decrease in LOC with encephalopathy due to COPD exacerbation with acute diastolic CHF.   He declined in the ED requiring intubation and was started on IV solumedrol, IV diuretics for fluid overload as well as IV antibiotics for strep PNA.  He self extubated, but was reintubated and  Hospital course complicated by elevated cardiac enzymes due to medical issues, accelerated HTN requiring multiple medications for control, SVT as well as development of right brachial DVT. He was started on IV heparin for DVT and transition to coumadin.  He had difficulty weaning off vent and required PEG placement on 12/01/14 as well as trach by ENT on 12/07/14. He was started on tube feeds but developed N/V due to ileus which has resolved. He has been unable to tolerate finger occlusion  therefore PMSV not attempted by SLP; trach was downsized to #6 and ENT recommends continuing this till weight loss due to his non-compliance with CPAP.  Therapy initiated and patient noted to be deconditioned; however, is making excellent progress with physical therapy and CIR recommended to maximize functional return as well as family education on trach care. Patient admitted 12/15/14.  Orders received; PMSV and Bedside swallow evaluations completed. Patient tolerated the PMSV for 1 hour with vitals remaining WFL and patient achieving phonation with mildly decreased breath support resulting in low vocal intensity with phrase-sentence level of verbal expression.  Patient consumed Dys.3 textures and thin liquids with cough x3 which SLP suspects was due to bolus size and cuing for portion control and pacing were effective at minimizing overt s/s of aspiration.  Patient's basic cognition appeared [facility]; however, SLP defers cognitive-linguistic assessment until next visit.  Patient requires skilled SLP service to address PMSV toleration and education as well as acute, reversible dysphagia to maximize functional independence prior to discharge home with 24/7 supervision assist.     Skilled Therapeutic Interventions          PMSV and Bedside swallow evaluation completed with results and recommendations reviewed with patient.  SLP defer's cognitive-linguistic evaluation until next visit.     SLP Assessment  Patient will need skilled Speech Lanaguage Pathology Services during CIR admission    Recommendations  Patient may use Passy-Muir Speech Valve: During all therapies with supervision;During PO intake/meals PMSV Supervision: Full MD: Please consider changing trach tube to : Smaller size;Cuffless Diet Recommendations: Dysphagia  3 (Mechanical Soft);Thin liquid Liquid Administration via: Cup;No straw Medication Administration: Whole meds with puree Supervision: Patient able to self feed;Full supervision/cueing for  compensatory strategies Compensations: Slow rate;Small sips/bites;Check for pocketing;Follow solids with liquid Postural Changes and/or Swallow Maneuvers: Out of bed for meals;Seated upright 90 degrees;Upright 30-60 min after meal Oral Care Recommendations: Oral care BID Patient destination: Home Follow up Recommendations: 24 hour supervision/assistance;Outpatient SLP Equipment Recommended: None recommended by SLP    SLP Frequency 5 out of 7 days   SLP Treatment/Interventions Cueing hierarchy;Dysphagia/aspiration precaution training;Environmental controls;Functional tasks;Internal/external aids;Patient/family education;Speech/Language facilitation    Pain Pain Assessment Pain Assessment: No/denies pain Prior Functioning Cognitive/Linguistic Baseline: Within functional limits Type of Home: House  Lives With: Family Available Help at Discharge: Family;Available 24 hours/day Vocation: Unemployed  Short Term Goals: Week 1: SLP Short Term Goal 1 (Week 1): STGs=LTGs due to length of stay  See FIM for current functional status Refer to Care Plan for Long Term Goals  Recommendations for other services: None  Discharge Criteria: Patient will be discharged from SLP if patient refuses treatment 3 consecutive times without medical reason, if treatment goals not met, if there is a change in medical status, if patient makes no progress towards goals or if patient is discharged from hospital.  The above assessment, treatment plan, treatment alternatives and goals were discussed and mutually agreed upon: by patient  Andrew Sparks, M.A., CCC-SLP [phone]  [place] 12/16/2014, 9:43 PM

## 2014-12-16 NOTE — Plan of Care (Signed)
Problem: Food- and Nutrition-Related Knowledge Deficit (NB-1.1) Goal: Nutrition education Formal process to instruct or train a patient/client in a skill or to impart knowledge to help patients/clients voluntarily manage or modify food choices and eating behavior to maintain or improve health. Outcome: Completed/Met Date Met:  12/16/14 Nutrition Education Note  RD consulted for weight loss education  RD provided "Weight Loss Tips" handout from the Academy of [employer]etics. Pt was recommend to start his weight loss diet once pt is feeling better and healthy at home. Discussed with pt the recommended weight loss of 1 pound a week. Educated that a 1 pound weight loss a week can be achieved by eating 500 calories less a day, excersing off 500 calories, or doing a combination of the two actions too equal 500 calories. Discussed the importance of eating 3 meals a day. Pt was additionally encouraged to consume fresh fruits and vegetables, lean protein sources, as well as whole grain sources of carbohydrates to maximize fiber intake. Teach back method used.  Expect good compliance.  Kallie Locks, MS, RD, LDN Pager # [phone] After hours/ weekend pager # [phone]

## 2014-12-16 NOTE — H&P (Signed)
Physical Medicine and Rehabilitation Admission H&P   CC: Acute encephalopathy past respiratory failure.   HPI: [name] is a 49 year old morbidly obese male with h/o OSA/OHS, hypersomnia, COPD with ongoing tobacco use, CHF, HTN who was admitted to [facility] on 11/16/14 with metabolic acidosis and decrease in LOC with encephalopathy due to COPD exacerbation with acute diastolic CHF. He declined in ED requiring intubation and was started on IV solumedrol, IV diuretics for fluid overload as well as IV antibiotics for strep PNA.Marland Kitchen. He self extubated on  He was reintubated and Hospital course complicated by elevated cardiac enzymes due to medical issues, accelerated HTN requiring multiple medications for control, SVT as well as development of right brachial DVT. He was started on IV heparin for DVT and transition to coumadin. He had difficulty weaning off vent and required PEG placement on 12/01/14 as well as trach by ENT on 12/07/14. He was started on tube feeds but developed N/V due to ileus which has resolved. He has been unable to tolerate finger occlusion therefore PMSV not attempted by ST. His trach was downsized to #6 and ENT recommends continuing this till weight loss due to his non-compliance with CPAP. Therapy initiated and patient noted to be deconditioned. He is making excellent progress with physical therapy and CIR recommended to maximize functional return as well as family education on trach care.   Patient has a long history of hypersomnolence. Following sleep while sitting up. Noncompliance with medications. Poor medical follow-up. Complicated social situation  Review of Systems  HENT: Negative for hearing loss.  Eyes: Negative for blurred vision and double vision.  Respiratory: Positive for shortness of breath (with minimal activity). Negative for cough.  Cardiovascular: Positive for leg swelling (chronic). Negative for chest pain and palpitations.  Genitourinary:  Negative for urgency and frequency.  Musculoskeletal: Positive for back pain.  Neurological: Positive for weakness. Negative for dizziness, tingling and headaches.  Psychiatric/Behavioral: The patient is nervous/anxious.      Past Medical History  Diagnosis Date  . Anxiety   . Morbid obesity   . Obesity hypoventilation syndrome   . Hypersomnia with sleep apnea       History reviewed. No pertinent past surgical history.    History reviewed. No pertinent family history.    Social History: Has been at the homeless shelter due to family concerns about safety at home but sister plans on taking him to her home past discharge. Has 49 year old son who can provide supervision during the day? Was independent PTA. [place] a [place] in the fall and currently unemployed. Smokes no tobacco, alcohol, and drug history on file.    Allergies: Allergies not on file  (Not in a hospital admission)  Home: One level home with 1 STE  Functional History: Independent PTA.   Functional Status:  Mobility: CGA for transfers CGA for ambulating 10' X 4     ADL:   Cognition:      Physical Exam: There were no vitals taken for this visit. Physical Exam  Nursing note and vitals reviewed. Constitutional: He is oriented to person, place, and time. He appears well-developed and well-nourished.  Morbidly obese male with ATC in place.  HENT:  Head: Normocephalic and atraumatic.  Eyes: Conjunctivae are normal. Pupils are equal, round, and reactive to light.  Neck:  Short neck with cuffed #6 trach in place.  Cardiovascular: Normal rate and regular rhythm.  Respiratory: Effort normal. No accessory muscle usage. No  respiratory distress. He has rhonchi.  GI: Soft. Bowel sounds are normal. He exhibits no distension. There is no tenderness.  PEG site clean and dry. Small umbilical hernia noted  Musculoskeletal: He exhibits edema.  BLE with  woody 3+ edema. R-hand and forearm with 2+ edema  Neurological: He is alert and oriented to person, place, and time.  Skin: Skin is warm and dry.  Psychiatric: He has a normal mood and affect. His behavior is normal. Thought content normal.  Motor strength is 4/5 bilateral deltoids, biceps, triceps, grip, hip flexor, knee extensor, ankle dorsi flexor and plantar flexor   Recent Labs:  Na- 139 K+ 3.5 CL- 100 Co2 - 31 BUN - 16 Cr- 0.74 Glucose- 95 WBC- 6.7 Hgb 12.8 Hct - 40.7 Plt - 290 INR- 1.9  CXR 1/8 --" Minimal improvement in CHF. Bibasilar airspace disease and bilateral pleural effusions"    Medical Problem List and Plan: 1. Functional deficits secondary to Encephalopathy past respiratory failure 2. RUE DVT treatment/ Anticoagulation: Pharmaceutical: Coumadin  3. Pain Management: Will change oxycodone to prn as denies pain at this moment.  4. Anxiety disorder/Mood: Continue celexa daily with xanax bid. LCSW to follow with patient and family for evaluation and support.  5. Neuropsych: This patient is capable of making decisions on her own behalf. 6. Skin/Wound Care: Routine pressure relief measures. Monitor PEG and trach site every shift.  7. Fluids/Electrolytes/Nutrition: Check daily weights. Monitor for signs of overload. Encourage appropriate diet. Add protein supplement to help with edema.  8. OSA/OHS: Reports chronic SOB as well as non-compliance. Will need to lose weight prior to decannulation. PMSV with supervision and keep trach open at nights.  9. Diastolic CHF: Monitor daily weights. Low salt diet. Continue lasix, lisinopril and metoprolol.  10. COPD: Educated on tobacco cessation. Will add nebs qid for now.     Post Admission Physician Evaluation: 1. Functional deficits secondary to Encephalopathy status post respiratory failure. 2. Patient is admitted to receive collaborative, interdisciplinary care between the physiatrist, rehab nursing staff,  and therapy team. 3. Patient's level of medical complexity and substantial therapy needs in context of that medical necessity cannot be provided at a lesser intensity of care such as a SNF. 4. Patient has experienced substantial functional loss from his/her baseline which was documented above under the "Functional History" and "Functional Status" headings. Judging by the patient's diagnosis, physical exam, and functional history, the patient has potential for functional progress which will result in measurable gains while on inpatient rehab. These gains will be of substantial and practical use upon discharge in facilitating mobility and self-care at the household level. 5. Physiatrist will provide 24 hour management of medical needs as well as oversight of the therapy plan/treatment and provide guidance as appropriate regarding the interaction of the two. 6. 24 hour rehab nursing will assist with bladder management, bowel management, safety, skin/wound care, disease management, medication administration, pain management and patient education and help integrate therapy concepts, techniques,education, etc. 7. PT will assess and treat for/with: pre gait, gait training, endurance , safety, equipment, neuromuscular re education. Goals are: Mod I/Sup. 8. OT will assess and treat for/with:ADLs, Cognitive perceptual skills, Neuromuscular re education, safety, endurance, equipment. Goals are: Mod I/Sup. Therapy may proceed with showering this patient. 9. SLP will assess and treat for/with: Swallowing,  breath support, cognition. Goals are: Safe and adequate by mouth intake, modified I medication management. 10. Case Management and Social Worker will assess and treat for psychological issues and discharge planning. 11.  Team conference will be held weekly to assess progress toward goals and to determine barriers to discharge. 12. Patient will receive at least 3 hours of therapy per day at least 5 days per  week. 13. ELOS: 7-10d  14. Prognosis: good     Erick Colace M.D. [facility] FAAPM&R (Sports Med, Neuromuscular Med) Diplomate Am Board of Electrodiagnostic Med

## 2014-12-17 ENCOUNTER — Inpatient Hospital Stay (HOSPITAL_COMMUNITY): Payer: Medicaid Other | Admitting: Speech Pathology

## 2014-12-17 ENCOUNTER — Inpatient Hospital Stay (HOSPITAL_COMMUNITY): Payer: Medicaid Other | Admitting: Occupational Therapy

## 2014-12-17 ENCOUNTER — Inpatient Hospital Stay (HOSPITAL_COMMUNITY): Payer: Self-pay | Admitting: Rehabilitation

## 2014-12-17 LAB — GLUCOSE, CAPILLARY
GLUCOSE-CAPILLARY: 113 mg/dL — AB (ref 70–99)
GLUCOSE-CAPILLARY: 123 mg/dL — AB (ref 70–99)
Glucose-Capillary: 115 mg/dL — ABNORMAL HIGH (ref 70–99)
Glucose-Capillary: 113 mg/dL — ABNORMAL HIGH (ref 70–99)
Glucose-Capillary: 112 mg/dL — ABNORMAL HIGH (ref 70–99)

## 2014-12-17 LAB — PROTIME-INR
INR: 2.75 — ABNORMAL HIGH (ref 0.00–1.49)
Prothrombin Time: 29.3 seconds — ABNORMAL HIGH (ref 11.6–15.2)

## 2014-12-17 MED ORDER — HYDROCORTISONE ACETATE 25 MG RE SUPP
25.0000 mg | Freq: Two times a day (BID) | RECTAL | Status: DC
  Administered 2014-12-17 – 2014-12-21 (×4): 25 mg via RECTAL
  Filled 2014-12-17 (×15): qty 1

## 2014-12-17 MED ORDER — WARFARIN SODIUM 5 MG PO TABS
5.0000 mg | ORAL_TABLET | Freq: Once | ORAL | Status: AC
  Administered 2014-12-17: 5 mg via ORAL
  Filled 2014-12-17: qty 1

## 2014-12-17 NOTE — Progress Notes (Signed)
  1 /14 0600 pt up to toilet and had small amt red blood noted on rim of toilet bowl and in toilet bowl ;reports has happened at home when straining to move bowels. KM

## 2014-12-17 NOTE — Progress Notes (Signed)
Physical Therapy Session Note  Patient Details  Name: Andrew Sparks MRN: [id number] Date of Birth: 01-06-1966  Today's Date: 12/17/2014 PT Individual Time: 1500-1600 PT Individual Time Calculation (min): 60 min   Short Term Goals: Week 1:  PT Short Term Goal 1 (Week 1): =LTG's due to ELOS  Skilled Therapeutic Interventions/Progress Updates:   Pt received sitting in recliner in room, agreeable to therapy session.  Pt requesting to use restroom prior to leaving room.  Ambulated to/from restroom with RW at close S level with cues for safe negotiation of RW in restroom.  Performed all toileting tasks at S level.  Ambulated to sink to wash hands, again at S level.  Allowed seated rest break prior to leaving room in hopes of ambulation all the way to therapy gym without rest break.  Pt ambulated to/from therapy gym with RW at close S level with cues for decreased speed and upright posture, along with pursed lip breathing.  Once in therapy gym, finished BERG balance test, see details below.  Scored 40/56, indicative of high fall risk.  Educated pt on score and ability to improve score at D/C.  Then performed TUG x 3 reps, avg time of 18.37 secs, also indicative of fall risk.  Then worked on high level balance and gait with kicking yoga block down the hallway x 70' x 2 reps with seated rest break in between.  Performed at S level with no overt LOB during task.  Then performed floor transfer with education on when to call 911 vs when to attempt floor transfer.  Pt was able to come up with different scenarios in which he should call 911 with min cues.  Performed floor transfer at S level following demonstration from PT.  Ended session with seated nustep x 8 mins with BUEs/LEs at level 4 resistance for overall strengthening and endurance.  Provided education on Borg RPE and pt able to perform task until at 14/15 level and then cued to take rest break.  Note at end of session, pt states he felt dizzy.  BP was  173/98.  Allowed rest break and ambulated back to room.  RN made aware of BP.  Pt left in recliner with all needs in reach.  Note:  Pt still has trach collar setting on 30% FiO2, again notified RN that he needs smaller setting to accommodate the setting he is currently on in the room.   Pts sats remained in 90's throughout session.    Therapy Documentation Precautions:  Precautions Precautions: Fall, Other (comment) Precaution Comments: trach- monitor O2 levels Restrictions Weight Bearing Restrictions: No   Vital Signs: Therapy Vitals Temp: 98.8 F (37.1 C) Temp Source: Oral Pulse Rate: (!) 56 Resp: 18 BP: (!) 160/102 mmHg (reported to stephanie rn) Patient Position (if appropriate): Sitting Oxygen Therapy SpO2: 97 % O2 Device: Tracheostomy Collar O2 Flow Rate (L/min): 5 L/min FiO2 (%): 28 % Pain: Pain Assessment Pain Assessment: No/denies pain    Balance: Balance Balance Assessed: Yes Standardized Balance Assessment Standardized Balance Assessment: Berg Balance Test (did not get to finish) Berg Balance Test Sit to Stand: Able to stand  independently using hands Standing Unsupported: Able to stand 2 minutes with supervision Sitting with Back Unsupported but Feet Supported on Floor or Stool: Able to sit safely and securely 2 minutes Stand to Sit: Controls descent by using hands Transfers: Able to transfer with verbal cueing and /or supervision Standing Unsupported with Eyes Closed: Able to stand 10 seconds with supervision Standing  Ubsupported with Feet Together: Able to place feet together independently and stand for 1 minute with supervision From Standing, Reach Forward with Outstretched Arm: Can reach confidently >25 cm (10") From Standing Position, Pick up Object from Floor: Able to pick up shoe, needs supervision From Standing Position, Turn to Look Behind Over each Shoulder: Looks behind from both sides and weight shifts well Turn 360 Degrees: Able to turn 360  degrees safely but slowly Standing Unsupported, Alternately Place Feet on Step/Stool: Able to complete 4 steps without aid or supervision Standing Unsupported, One Foot in Front: Able to plae foot ahead of the other independently and hold 30 seconds Standing on One Leg: Tries to lift leg/unable to hold 3 seconds but remains standing independently Total Score: 40 Static Sitting Balance Static Sitting - Balance Support: Feet supported;No upper extremity supported Static Sitting - Level of Assistance: 6: Modified independent (Device/Increase time) Dynamic Sitting Balance Dynamic Sitting - Balance Support: Feet supported;No upper extremity supported Dynamic Sitting - Level of Assistance: 5: Stand by assistance;4: Min assist Static Standing Balance Static Standing - Balance Support: No upper extremity supported Static Standing - Level of Assistance: 4: Min assist;5: Stand by assistance Dynamic Standing Balance Dynamic Standing - Balance Support: During functional activity;No upper extremity supported Dynamic Standing - Level of Assistance: 4: Min assist   See FIM for current functional status  Therapy/Group: Individual Therapy  Vista Deck 12/17/2014, 3:49 PM

## 2014-12-17 NOTE — Progress Notes (Signed)
Occupational Therapy Session Note  Patient Details  Name: Andrew Sparks MRN: [id number] Date of Birth: Nov 11, 1966  Today's Date: 12/17/2014 OT Individual Time: 0800-0900 OT Individual Time Calculation (min): 60 min    Short Term Goals: Week 1:  OT Short Term Goal 1 (Week 1): STGs = LTGs secondary to estimated short length of stay  Skilled Therapeutic Interventions/Progress Updates:  Upon entering the room, pt seated in recliner chair finishing breakfast. Pt with no c/o pain this session. Skilled OT session with focus on self care retraining, STS, dynamic standing balance, and pt education. Pt requesting to remain in recliner chair and wash in basin. Pt required steady assist with dynamic stand balance during LB bathing and clothing management. Pt unable to reach B feet to wash and donn B socks this session. OT will introduce AE next session to address this issue in order to increase I. OT wrapped B LEs from mid foot to mid thigh in order to decrease swelling per physician order. OT educated pt on purpose for these wraps with pt verbalizing understanding. STS x 3 during session with min A and min verbal cues for proper technique. Pt seated in recliner chair awaiting next therapist with call bell and all needed items within reach upon exiting the room.   Therapy Documentation Precautions:  Precautions Precautions: Fall, Other (comment) Precaution Comments: trach- monitor O2 levels Restrictions Weight Bearing Restrictions: No Vital Signs: Oxygen Therapy SpO2: 95 % O2 Device: Tracheostomy Collar O2 Flow Rate (L/min): 5 L/min FiO2 (%): 28 %  See FIM for current functional status  Therapy/Group: Individual Therapy  [name], [name] L 12/17/2014, 10:25 AM

## 2014-12-17 NOTE — Progress Notes (Signed)
Speech Language Pathology Daily Session Note  Patient Details  Name: Andrew Sparks MRN: [id number] Date of Birth: 01/29/1966  Today's Date: 12/17/2014 SLP Individual Time: [id number] SLP Individual Time Calculation (min): 60 min  Short Term Goals: Week 1: SLP Short Term Goal 1 (Week 1): STGs=LTGs due to length of stay  Skilled Therapeutic Interventions: Cognitive-linguistic evaluation completed.  Patient demonstrates impairments in storage and retrieval of new information as well as complex problem solving.  Patient reports never being good at things such as math calculations or remembering; however, recall of new information does impact his ability to care for himself and PMSV.  As a result, care plan modified to address memory.  Patient wore PMSV for 1 hour with vitals remaining WFL.  SLP initiated education with Min multimodal cues faded to Mod I with use of mirror.  As a result, PMSV orders modified for wear 24/7 with intermittent supervision. Continue with current plan of care.     FIM:  Comprehension Comprehension Mode: Auditory Comprehension: 5-Follows basic conversation/direction: With no assist Expression Expression Mode: Verbal Expression Assistive Devices: 6-Talk trach valve Expression: 5-Expresses basic 90% of the time/requires cueing < 10% of the time. Social Interaction Social Interaction: 5-Interacts appropriately 90% of the time - Needs monitoring or encouragement for participation or interaction. Problem Solving Problem Solving: 5-Solves basic problems: With no assist Memory Memory: 5-Recognizes or recalls 90% of the time/requires cueing < 10% of the time  Pain Pain Assessment Pain Assessment: No/denies pain  Therapy/Group: Individual Therapy  Charlane FerrettiMelissa Coley Littles, M.A., CCC-SLP [phone]  Yvette Loveless 12/17/2014, 12:26 PM

## 2014-12-17 NOTE — Progress Notes (Signed)
ANTICOAGULATION CONSULT NOTE - Initial Consult  Pharmacy Consult for Coumadin Indication: DVT  No Known Allergies  Patient Measurements: Height: 5\' 10"  (177.8 cm) Weight: 294 lb 3 oz (133.443 kg) IBW/kg (Calculated) : 73  Vital Signs: Temp: 97.9 F (36.6 C) (01/14 0414) Temp Source: Oral (01/14 0414) BP: 173/104 mmHg (01/14 0545) Pulse Rate: 63 (01/14 0545)  Labs:  Recent Labs  12/16/14 0416 12/17/14 0613  HGB 11.9*  --   HCT 36.9*  --   PLT 229  --   LABPROT 27.5* 29.3*  INR 2.54* 2.75*  CREATININE 0.85  --     Estimated Creatinine Clearance: 146.1 mL/min (by C-G formula based on Cr of 0.85).   Medical History: Past Medical History  Diagnosis Date  . Anxiety   . Morbid obesity   . Obesity hypoventilation syndrome   . Hypersomnia with sleep apnea   . Hypertension   . Stroke   . Sleep apnea   . Shortness of breath dyspnea   . Depression     Assessment: 49 y/o transferred from [place] to inpatient rehab on 1/12 after complicated hospital course, he is currently on warfarin for new DVT. INR 2.75, Doses of Warfarin prescribed cannot be found in [facility] notes but pt was discharged to rehab on 7.5mg  daily. hgb 11.9, plt 229k. Small amount BRBPR with BM this morning, not other bleeding episode noted.  Goal of Therapy:  INR 2-3 Monitor platelets by anticoagulation protocol: Yes   Plan:  Coumadin 5mg  po x 1 Daily INR Coumadin book/video  Bayard HuggerMei Aristotelis Vilardi, PharmD, BCPS  Clinical Pharmacist  Pager: [phone]  12/17/2014,11:33 AM

## 2014-12-17 NOTE — Plan of Care (Signed)
Problem: RH Balance Goal: LTG Patient will maintain dynamic standing balance (PT) LTG: Patient will maintain dynamic standing balance with assistance during mobility activities (PT)  Upgraded due to amount of progress  Problem: RH Floor Transfers Goal: LTG Patient will perform floor transfers w/assist (PT) LTG: Patient will perform floor transfers with assistance (PT).  Updated due to amount of progress  Problem: RH Ambulation Goal: LTG Patient will ambulate in controlled environment (PT) LTG: Patient will ambulate in a controlled environment, # of feet with assistance (PT).  Upgraded due to amount of progress Goal: LTG Patient will ambulate in home environment (PT) LTG: Patient will ambulate in home environment, # of feet with assistance (PT).  Upgraded due to amount of progress

## 2014-12-17 NOTE — Progress Notes (Signed)
49 year old morbidly obese male with h/o OSA/OHS, hypersomnia, COPD with ongoing tobacco use, CHF, HTN who was admitted to [facility] on 43/32/95 with metabolic acidosis and decrease in LOC with encephalopathy due to COPD exacerbation with acute diastolic CHF. He declined in ED requiring intubation and was started on IV solumedrol, IV diuretics for fluid overload as well as IV antibiotics for strep PNA.Marland Kitchen He self extubated on  He was reintubated and Hospital course complicated by elevated cardiac enzymes due to medical issues, accelerated HTN requiring multiple medications for control, SVT as well as development of right brachial DVT. He was started on IV heparin for DVT and transition to coumadin. He had difficulty weaning off vent and required PEG placement on 12/01/14 as well as trach by ENT on 12/07/14. He was started on tube feeds but developed N/V due to ileus which has resolved Subjective/Complaints: Breathing ok, awakened by oximetry alarm last noc  Review of Systems - Negative except some blood per rectum with BM Objective: Vital Signs: Blood pressure 173/104, pulse 63, temperature 97.9 F (36.6 C), temperature source Oral, resp. rate 17, height $RemoveBe'5\' 10"'EYMywweoR$  (1.778 m), weight 133.443 kg (294 lb 3 oz), SpO2 95 %. Dg Chest 2 View  12/15/2014   CLINICAL DATA:  Followup congestive heart failure  EXAM: CHEST  2 VIEW  COMPARISON:  None.  FINDINGS: Cardiomegaly is noted. The study is limited by patient's large body habitus and poor inspiration. There is streaky bilateral basilar atelectasis or infiltrate. Tracheostomy tube in place. No convincing pulmonary edema.  IMPRESSION: Tracheostomy tube in place. Cardiomegaly. Streaky bilateral basilar atelectasis or infiltrate. No convincing pulmonary edema.   Electronically Signed   By: Lahoma Crocker M.D.   On: 12/15/2014 19:34   Results for orders placed or performed during the hospital encounter of 12/15/14 (from the past 72 hour(s))  Glucose, capillary      Status: Abnormal   Collection Time: 12/15/14  4:49 PM  Result Value Ref Range   Glucose-Capillary 103 (H) 70 - 99 mg/dL  Glucose, capillary     Status: Abnormal   Collection Time: 12/15/14  8:39 PM  Result Value Ref Range   Glucose-Capillary 118 (H) 70 - 99 mg/dL  Comprehensive metabolic panel     Status: Abnormal   Collection Time: 12/16/14  4:16 AM  Result Value Ref Range   Sodium 133 (L) 135 - 145 mmol/L    Comment: Please note change in reference range.   Potassium 3.2 (L) 3.5 - 5.1 mmol/L    Comment: Please note change in reference range.   Chloride 95 (L) 96 - 112 mEq/L   CO2 29 19 - 32 mmol/L   Glucose, Bld 113 (H) 70 - 99 mg/dL   BUN 8 6 - 23 mg/dL   Creatinine, Ser 0.85 0.50 - 1.35 mg/dL   Calcium 8.7 8.4 - 10.5 mg/dL   Total Protein 6.6 6.0 - 8.3 g/dL   Albumin 2.8 (L) 3.5 - 5.2 g/dL   AST 16 0 - 37 U/L   ALT 15 0 - 53 U/L   Alkaline Phosphatase 65 39 - 117 U/L   Total Bilirubin 0.5 0.3 - 1.2 mg/dL   GFR calc non Af Amer >90 >90 mL/min   GFR calc Af Amer >90 >90 mL/min    Comment: (NOTE) The eGFR has been calculated using the CKD EPI equation. This calculation has not been validated in all clinical situations. eGFR's persistently <90 mL/min signify possible Chronic Kidney Disease.    Anion  gap 9 5 - 15  CBC WITH DIFFERENTIAL     Status: Abnormal   Collection Time: 12/16/14  4:16 AM  Result Value Ref Range   WBC 6.8 4.0 - 10.5 K/uL   RBC 4.06 (L) 4.22 - 5.81 MIL/uL   Hemoglobin 11.9 (L) 13.0 - 17.0 g/dL   HCT 36.9 (L) 39.0 - 52.0 %   MCV 90.9 78.0 - 100.0 fL   MCH 29.3 26.0 - 34.0 pg   MCHC 32.2 30.0 - 36.0 g/dL   RDW 13.2 11.5 - 15.5 %   Platelets 229 150 - 400 K/uL   Neutrophils Relative % 58 43 - 77 %   Neutro Abs 4.0 1.7 - 7.7 K/uL   Lymphocytes Relative 27 12 - 46 %   Lymphs Abs 1.8 0.7 - 4.0 K/uL   Monocytes Relative 11 3 - 12 %   Monocytes Absolute 0.7 0.1 - 1.0 K/uL   Eosinophils Relative 4 0 - 5 %   Eosinophils Absolute 0.3 0.0 - 0.7 K/uL    Basophils Relative 0 0 - 1 %   Basophils Absolute 0.0 0.0 - 0.1 K/uL  Hemoglobin A1c     Status: Abnormal   Collection Time: 12/16/14  4:16 AM  Result Value Ref Range   Hgb A1c MFr Bld 6.3 (H) <5.7 %    Comment: (NOTE)                                                                       According to the ADA Clinical Practice Recommendations for 2011, when HbA1c is used as a screening test:  >=6.5%   Diagnostic of Diabetes Mellitus           (if abnormal result is confirmed) 5.7-6.4%   Increased risk of developing Diabetes Mellitus References:Diagnosis and Classification of Diabetes Mellitus,Diabetes RDEY,8144,81(EHUDJ 1):S62-S69 and Standards of Medical Care in         Diabetes - 2011,Diabetes Care,2011,34 (Suppl 1):S11-S61.    Mean Plasma Glucose 134 (H) <117 mg/dL    Comment: Performed at [facility]     Status: Abnormal   Collection Time: 12/16/14  4:16 AM  Result Value Ref Range   Prothrombin Time 27.5 (H) 11.6 - 15.2 seconds   INR 2.54 (H) 0.00 - 1.49  Glucose, capillary     Status: Abnormal   Collection Time: 12/16/14  7:05 AM  Result Value Ref Range   Glucose-Capillary 112 (H) 70 - 99 mg/dL  Glucose, capillary     Status: Abnormal   Collection Time: 12/16/14 11:59 AM  Result Value Ref Range   Glucose-Capillary 132 (H) 70 - 99 mg/dL  Glucose, capillary     Status: Abnormal   Collection Time: 12/16/14  4:33 PM  Result Value Ref Range   Glucose-Capillary 116 (H) 70 - 99 mg/dL  Glucose, capillary     Status: Abnormal   Collection Time: 12/16/14  8:42 PM  Result Value Ref Range   Glucose-Capillary 113 (H) 70 - 99 mg/dL  Protime-INR     Status: Abnormal   Collection Time: 12/17/14  6:13 AM  Result Value Ref Range   Prothrombin Time 29.3 (H) 11.6 - 15.2 seconds   INR 2.75 (H) 0.00 - 1.49  Glucose, capillary     Status: Abnormal   Collection Time: 12/17/14  6:28 AM  Result Value Ref Range   Glucose-Capillary 115 (H) 70 - 99 mg/dL     HEENT:  trach site ok #6 Cardio: RRR and no murmur Resp: CTA B/L and unlabored GI: BS positive and NT, ND Extremity:  Pulses positive and Edema BLE pretib and pedal 2+ Skin:   Other stasis dermatitis bilateral pretibial Neuro: Alert/Oriented, Cranial Nerve II-XII normal, Normal Sensory and Abnormal Motor 4/5 bilateral deltoids, biceps, triceps, grip, hip flexors, knee extensor, ankle dorsiflexor and plantar flexor Musc/Skel:  Normal Gen. no acute distress  Mood and affect appropriate   Assessment/Plan: 1. Functional deficits secondary to encephalopathy and deconditioning secondary to respiratory failure which require 3+ hours per day of interdisciplinary therapy in a comprehensive inpatient rehab setting. Physiatrist is providing close team supervision and 24 hour management of active medical problems listed below. Physiatrist and rehab team continue to assess barriers to discharge/monitor patient progress toward functional and medical goals. FIM: FIM - Bathing Bathing Steps Patient Completed: Chest, Right Arm, Left Arm, Abdomen, Front perineal area, Buttocks, Right upper leg, Left upper leg Bathing: 4: Min-Patient completes 8-9 26f 10 parts or 75+ percent  FIM - Upper Body Dressing/Undressing Upper body dressing/undressing: 0: Wears gown/pajamas-no public clothing FIM - Lower Body Dressing/Undressing Lower body dressing/undressing steps patient completed: Thread/unthread right pants leg, Thread/unthread left pants leg, Pull pants up/down Lower body dressing/undressing: 3: Mod-Patient completed 50-74% of tasks        FIM - Bed/Chair Transfer Bed/Chair Transfer: 5: Supine > Sit: Supervision (verbal cues/safety issues), 5: Sit > Supine: Supervision (verbal cues/safety issues), 4: Bed > Chair or W/C: Min A (steadying Pt. > 75%), 4: Chair or W/C > Bed: Min A (steadying Pt. > 75%)  FIM - Locomotion: Wheelchair Distance: 150 Locomotion: Wheelchair: 5: Travels 150 ft or more: maneuvers on rugs and  over door sills with supervision, cueing or coaxing FIM - Locomotion: Ambulation Locomotion: Ambulation Assistive Devices: Other (comment) (no AD) Ambulation/Gait Assistance: 4: Min assist, 4: Min guard Locomotion: Ambulation: 1: Travels less than 50 ft with minimal assistance (Pt.>75%)  Comprehension Comprehension Mode: Auditory Comprehension: 5-Follows basic conversation/direction: With extra time/assistive device  Expression Expression Mode: Verbal Expression Assistive Devices: 6-Talk trach valve Expression: 5-Expresses basic 90% of the time/requires cueing < 10% of the time.  Social Interaction Social Interaction: 5-Interacts appropriately 90% of the time - Needs monitoring or encouragement for participation or interaction.  Problem Solving Problem Solving: 5-Solves basic 90% of the time/requires cueing < 10% of the time  Memory Memory: 5-Recognizes or recalls 90% of the time/requires cueing < 10% of the time   Medical Problem List and Plan: 1. Functional deficits secondary to Encephalopathy past respiratory failure 2. RUE DVT treatment/ Anticoagulation: Pharmaceutical: Coumadin  3. Pain Management: Will change oxycodone to prn as denies pain at this moment.  4. Anxiety disorder/Mood: Continue celexa daily with xanax bid. LCSW to follow with patient and family for evaluation and support.  5. Neuropsych: This patient is capable of making decisions on her own behalf. 6. Skin/Wound Care: Routine pressure relief measures. Monitor PEG and trach site every shift.  7. Fluids/Electrolytes/Nutrition: Check daily weights. Monitor for signs of overload. Encourage appropriate diet. Add protein supplement to help with edema.  8. OSA/OHS: Reports chronic SOB as well as non-compliance. Will need to lose weight prior to decannulation. PMSV with supervision and keep trach open at nights.  9. Diastolic CHF: Monitor daily  weights. Low salt diet. Continue lasix, lisinopril and metoprolol.   10. COPD: Educated on tobacco cessation. Will add nebs qid for now.  LOS (Days) 2 A FACE TO FACE EVALUATION WAS PERFORMED  Mychael Smock E 12/17/2014, 9:01 AM

## 2014-12-18 ENCOUNTER — Inpatient Hospital Stay (HOSPITAL_COMMUNITY): Payer: Medicaid Other | Admitting: Occupational Therapy

## 2014-12-18 ENCOUNTER — Inpatient Hospital Stay (HOSPITAL_COMMUNITY): Payer: Medicaid Other

## 2014-12-18 ENCOUNTER — Inpatient Hospital Stay (HOSPITAL_COMMUNITY): Payer: Medicaid Other | Admitting: Physical Therapy

## 2014-12-18 ENCOUNTER — Inpatient Hospital Stay (HOSPITAL_COMMUNITY): Payer: Medicaid Other | Admitting: Speech Pathology

## 2014-12-18 DIAGNOSIS — Z559 Problems related to education and literacy, unspecified: Secondary | ICD-10-CM

## 2014-12-18 DIAGNOSIS — J441 Chronic obstructive pulmonary disease with (acute) exacerbation: Secondary | ICD-10-CM

## 2014-12-18 DIAGNOSIS — I82621 Acute embolism and thrombosis of deep veins of right upper extremity: Secondary | ICD-10-CM

## 2014-12-18 LAB — PROTIME-INR
INR: 3.11 — AB (ref 0.00–1.49)
PROTHROMBIN TIME: 32.2 s — AB (ref 11.6–15.2)

## 2014-12-18 LAB — BASIC METABOLIC PANEL
Anion gap: 9 (ref 5–15)
BUN: 5 mg/dL — ABNORMAL LOW (ref 6–23)
CHLORIDE: 98 meq/L (ref 96–112)
CO2: 28 mmol/L (ref 19–32)
CREATININE: 0.79 mg/dL (ref 0.50–1.35)
Calcium: 9 mg/dL (ref 8.4–10.5)
GFR calc non Af Amer: >90 mL/min (ref >90)
Glucose, Bld: 107 mg/dL — ABNORMAL HIGH (ref 70–99)
Potassium: 3.6 mmol/L (ref 3.5–5.1)
Sodium: 135 mmol/L (ref 135–145)

## 2014-12-18 LAB — GLUCOSE, CAPILLARY
GLUCOSE-CAPILLARY: 102 mg/dL — AB (ref 70–99)
Glucose-Capillary: 103 mg/dL — ABNORMAL HIGH (ref 70–99)
Glucose-Capillary: 111 mg/dL — ABNORMAL HIGH (ref 70–99)
Glucose-Capillary: 108 mg/dL — ABNORMAL HIGH (ref 70–99)

## 2014-12-18 MED ORDER — POTASSIUM CHLORIDE CRYS ER 20 MEQ PO TBCR
20.0000 meq | EXTENDED_RELEASE_TABLET | Freq: Two times a day (BID) | ORAL | Status: DC
  Administered 2014-12-18 – 2014-12-23 (×10): 20 meq via ORAL
  Filled 2014-12-18 (×12): qty 1

## 2014-12-18 MED ORDER — WARFARIN SODIUM 2 MG PO TABS
2.0000 mg | ORAL_TABLET | Freq: Once | ORAL | Status: AC
Start: 2014-12-18 — End: 2014-12-18
  Administered 2014-12-18: 2 mg via ORAL
  Filled 2014-12-18: qty 1

## 2014-12-18 NOTE — Progress Notes (Signed)
Social Work Patient ID: Andrew Sparks, male   DOB: Nov 14, 1966, 49 y.o.   MRN: [id number] Spoke with sister about coming in for family education prior to discharge.  She is very concerned that pt is doing ok and medically stable before coming home.  She states: " It Was a nightmare him falling asleep standing, eating and cooking, I can't go back to that."  She is not able to come in sooner due to gas cost and coming from [place], she and her daughter will need To come day of discharge for training.  Have tentatively scheduled for Tuesday at 9;30 am, have let Deborah-RN know to schedule RT to come to train them.  Will get DME and O2 needs together to order supplies. Work on discharge Tuesday-some therapy goals upgraded to mod/i level.

## 2014-12-18 NOTE — Progress Notes (Signed)
ANTICOAGULATION CONSULT NOTE - Initial Consult  Pharmacy Consult for Coumadin Indication: DVT  No Known Allergies  Patient Measurements: Height: 5\' 10"  (177.8 cm) Weight: 293 lb (132.904 kg) IBW/kg (Calculated) : 73  Vital Signs: Temp: 98.9 F (37.2 C) (01/15 0503) Temp Source: Oral (01/15 0503) BP: 163/89 mmHg (01/15 0536) Pulse Rate: 62 (01/15 1152)  Labs:  Recent Labs  12/16/14 0416 12/17/14 0613 12/18/14 0735  HGB 11.9*  --   --   HCT 36.9*  --   --   PLT 229  --   --   LABPROT 27.5* 29.3* 32.2*  INR 2.54* 2.75* 3.11*  CREATININE 0.85  --  0.79    Estimated Creatinine Clearance: 154.9 mL/min (by C-G formula based on Cr of 0.79).   Medical History: Past Medical History  Diagnosis Date  . Anxiety   . Morbid obesity   . Obesity hypoventilation syndrome   . Hypersomnia with sleep apnea   . Hypertension   . Stroke   . Sleep apnea   . Shortness of breath dyspnea   . Depression     Assessment: 49 y/o transferred from [place] to inpatient rehab on 1/12 after complicated hospital course, he is currently on warfarin for new DVT. INR 2.75 > 3.11, Doses of Warfarin prescribed cannot be found in [facility] notes but pt was discharged to rehab on 7.5mg  daily. hgb 11.9, plt 229k. Not other bleeding episode noted.  Goal of Therapy:  INR 2-3 Monitor platelets by anticoagulation protocol: Yes   Plan:  Coumadin 2 mg po x 1 Daily INR Coumadin book/video  Bayard HuggerMei Nobuo Nunziata, PharmD, BCPS  Clinical Pharmacist  Pager: [phone]  12/18/2014,12:20 PM

## 2014-12-18 NOTE — IPOC Note (Signed)
Overall Plan of Care [facility](IPOC) Patient Details Name: [name] MRN: [id number] DOB: 1966/11/08  Admitting Diagnosis: RESP FAILURE  TRACH  SIG SLEEP APNEA   Hospital Problems: Active Problems:   Encephalopathy pulmonary   Status post tracheostomy   Status post gastrostomy   Debility   Obesity hypoventilation syndrome     Functional Problem List: Nursing Endurance, Medication Management, Motor, Safety, Skin Integrity  PT Balance, Edema, Endurance, Motor, Safety  OT Balance, Cognition, Edema, Endurance, Motor, Pain, Safety  SLP Cognition  TR         Basic ADL's: OT Grooming, Bathing, Dressing, Toileting     Advanced  ADL's: OT Simple Meal Preparation     Transfers: PT Bed Mobility, Bed to Chair, Car, [employer]iture  OT Toilet     Locomotion: PT Ambulation, Stairs     Additional Impairments: OT None  SLP Social Cognition expression Memory  TR      Anticipated Outcomes Item Anticipated Outcome  Self Feeding n/a  Swallowing  Mod I    Basic self-care  Mod I - S  Toileting  Mod I   Bathroom Transfers Mod I - toilet transfer  Bowel/Bladder  Continent to bladder and bowel.  Transfers  mod I  Locomotion  S at an ambulatory level  Communication  Mod I   Cognition  Supervision with complex  Pain  Pain level less than 3,on scale 1 to 10.  Safety/Judgment  Pt. will be free from falls during his stay in rehab.   Therapy Plan: PT Intensity: Minimum of 1-2 x/day ,45 to 90 minutes PT Frequency: 5 out of 7 days PT Duration Estimated Length of Stay: 6-7 days OT Intensity: Minimum of 1-2 x/day, 45 to 90 minutes OT Frequency: 5 out of 7 days OT Duration/Estimated Length of Stay: 6-7 days SLP Intensity: Minumum of 1-2 x/day, 30 to 90 minutes SLP Frequency: 5 out of 7 days SLP Duration/Estimated Length of Stay: 1/19       Team Interventions: Nursing Interventions Patient/Family Education, Disease Management/Prevention, Dysphagia/Aspiration Precaution Training,  Medication Management  PT interventions Ambulation/gait training, Warden/rangerBalance/vestibular training, Cognitive remediation/compensation, Discharge planning, Disease management/prevention, DME/adaptive equipment instruction, Functional mobility training, Neuromuscular re-education, Patient/family education, Skin care/wound management, Stair training, Therapeutic Activities, Therapeutic Exercise, UE/LE Strength taining/ROM  OT Interventions Balance/vestibular training, Cognitive remediation/compensation, Community reintegration, Discharge planning, Pain management, Self Care/advanced ADL retraining, Therapeutic Activities, UE/LE Coordination activities, Functional mobility training, Patient/family education, Therapeutic Exercise, UE/LE Strength taining/ROM, Psychosocial support, DME/adaptive equipment instruction  SLP Interventions Cueing hierarchy, Dysphagia/aspiration precaution training, Environmental controls, Functional tasks, Internal/external aids, Patient/family education, Speech/Language facilitation, Cognitive remediation/compensation  TR Interventions    SW/CM Interventions Discharge Planning, Patient/Family Education, Psychosocial Support    Team Discharge Planning: Destination: PT-Home ,OT- Home , SLP-Home Projected Follow-up: PT-Home health PT, 24 hour supervision/assistance, OT-  Home health OT, Other (comment), 24 hour supervision/assistance, SLP-24 hour supervision/assistance, Outpatient SLP Projected Equipment Needs: PT-To be determined, OT- To be determined, SLP-None recommended by SLP Equipment Details: PT- , OT-  Patient/family involved in discharge planning: PT- Patient,  OT-Patient, SLP-Patient  MD ELOS: 7-10d Medical Rehab Prognosis:  Good Assessment: 49 year old morbidly obese male with h/o OSA/OHS, hypersomnia, COPD with ongoing tobacco use, CHF, HTN who was admitted to [facility] on 11/16/14 with metabolic acidosis and decrease in LOC with encephalopathy due to COPD exacerbation with  acute diastolic CHF. He declined in ED requiring intubation and was started on IV solumedrol, IV diuretics for fluid overload as well as IV antibiotics for  strep PNA.Marland Kitchen He self extubated on  He was reintubated and Hospital course complicated by elevated cardiac enzymes due to medical issues, accelerated HTN requiring multiple medications for control, SVT as well as development of right brachial DVT. He was started on IV heparin for DVT and transition to coumadin. He had difficulty weaning off vent and required PEG placement on 12/01/14 as well as trach by ENT on 12/07/14. He was started on tube feeds but developed N/V due to ileus which has resolved. He has been unable to tolerate finger occlusion therefore PMSV not attempted by ST. His trach was downsized to #6 and ENT recommends continuing this till weight loss due to his non-compliance with CPAP.    Now requiring 24/7 Rehab RN,MD, as well as CIR level PT, OT and SLP.  Treatment team will focus on ADLs and mobility with goals set at Mod I  See Team Conference Notes for weekly updates to the plan of care

## 2014-12-18 NOTE — Progress Notes (Signed)
NUTRITION FOLLOW UP  DOCUMENTATION CODES Per approved criteria  -Morbid Obesity   INTERVENTION: Provide Glucerna Shake po BID, each supplement provides 220 kcal and 10 grams of protein.  Continue free water flushes.  Encourage adequate PO intake.  NUTRITION DIAGNOSIS: Inadequate oral intake related to decreased appetite as evidenced by meal completion of 0-40%; progressing  Goal: Pt to meet >/= 90% of their estimated nutrition needs; progressing  Monitor:  PO intake, weight trends, labs, I/O's  49 y.o. male  Admitting Dx: Encephalopathy Pulmonary  ASSESSMENT: Morbidly obese male with h/o OSA/OHS, hypersomnia, COPD with ongoing tobacco use, CHF, HTN who was admitted to [facility] on 11/16/14 with metabolic acidosis and decrease in LOC with encephalopathy due to COPD exacerbation with acute diastolic CHF.  Appetite has been improving. Meal completion has been 50-80%. Pt has been drinking his Glucerna Shakes. Pt was encouraged to eat his food at meals and to drink his supplements as po intake has been inadequate.  Labs and medications reviewed.  Height: Ht Readings from Last 1 Encounters:  12/15/14 5\' 10"  (1.778 m)    Weight: Wt Readings from Last 1 Encounters:  12/18/14 293 lb (132.904 kg)    BMI:  Body Mass Index: 42.49 kg/(m^2) Morbid obesity  Re-Estimated Nutritional Needs: Kcal: 2200-2400 Protein: 120-140 grams Fluid: 2.2 - 2.4 L/day  Skin: non-pitting LUE, +1 LE edema  Diet Order: DIET DYS 3   Intake/Output Summary (Last 24 hours) at 12/18/14 1244 Last data filed at 12/18/14 0900  Gross per 24 hour  Intake    560 ml  Output      0 ml  Net    560 ml    Last BM: 1/14  Labs:   Recent Labs Lab 12/16/14 0416 12/18/14 0735  NA 133* 135  K 3.2* 3.6  CL 95* 98  CO2 29 28  BUN 8 5*  CREATININE 0.85 0.79  CALCIUM 8.7 9.0  GLUCOSE 113* 107*    CBG (last 3)   Recent Labs  12/17/14 2036 12/18/14 0645 12/18/14 1127  GLUCAP 112* 103* 111*     Scheduled Meds: . citalopram  20 mg Oral Daily  . cloNIDine  0.1 mg Oral TID  . famotidine  20 mg Oral BID  . feeding supplement (GLUCERNA SHAKE)  237 mL Oral BID BM  . free water  200 mL Per Tube 3 times per day  . furosemide  40 mg Oral Daily  . hydrALAZINE  100 mg Oral 3 times per day  . hydrocortisone  25 mg Rectal BID  . insulin aspart  0-5 Units Subcutaneous QHS  . insulin aspart  0-9 Units Subcutaneous TID WC  . lisinopril  40 mg Oral BID  . magnesium oxide  200 mg Oral BID  . metoprolol tartrate  50 mg Oral BID  . nitroGLYCERIN  1 inch Topical BID  . potassium chloride  20 mEq Oral BID  . warfarin  2 mg Oral ONCE-1800  . Warfarin - Pharmacist Dosing Inpatient   Does not apply q1800    Continuous Infusions:   Past Medical History  Diagnosis Date  . Anxiety   . Morbid obesity   . Obesity hypoventilation syndrome   . Hypersomnia with sleep apnea   . Hypertension   . Stroke   . Sleep apnea   . Shortness of breath dyspnea   . Depression     No past surgical history on file.  Marijean NiemannStephanie La, MS, RD, LDN Pager # [phone] After hours/ weekend pager #  [phone]  

## 2014-12-18 NOTE — Progress Notes (Signed)
49 year old morbidly obese male with h/o OSA/OHS, hypersomnia, COPD with ongoing tobacco use, CHF, HTN who was admitted to [facility] on 87/86/76 with metabolic acidosis and decrease in LOC with encephalopathy due to COPD exacerbation with acute diastolic CHF. He declined in ED requiring intubation and was started on IV solumedrol, IV diuretics for fluid overload as well as IV antibiotics for strep PNA.Marland Kitchen He self extubated on  He was reintubated and Hospital course complicated by elevated cardiac enzymes due to medical issues, accelerated HTN requiring multiple medications for control, SVT as well as development of right brachial DVT. He was started on IV heparin for DVT and transition to coumadin. He had difficulty weaning off vent and required PEG placement on 12/01/14 as well as trach by ENT on 12/07/14. He was started on tube feeds but developed N/V due to ileus which has resolved Subjective/Complaints: Breathing ok, awakened by oximetry alarm last noc  Review of Systems - Negative except some blood per rectum with BM Objective: Vital Signs: Blood pressure 163/89, pulse 64, temperature 98.9 F (37.2 C), temperature source Oral, resp. rate 18, height $RemoveBe'5\' 10"'aRmmNhgCg$  (1.778 m), weight 132.904 kg (293 lb), SpO2 91 %. No results found. Results for orders placed or performed during the hospital encounter of 12/15/14 (from the past 72 hour(s))  Glucose, capillary     Status: Abnormal   Collection Time: 12/15/14  4:49 PM  Result Value Ref Range   Glucose-Capillary 103 (H) 70 - 99 mg/dL  Glucose, capillary     Status: Abnormal   Collection Time: 12/15/14  8:39 PM  Result Value Ref Range   Glucose-Capillary 118 (H) 70 - 99 mg/dL  Comprehensive metabolic panel     Status: Abnormal   Collection Time: 12/16/14  4:16 AM  Result Value Ref Range   Sodium 133 (L) 135 - 145 mmol/L    Comment: Please note change in reference range.   Potassium 3.2 (L) 3.5 - 5.1 mmol/L    Comment: Please note change in reference  range.   Chloride 95 (L) 96 - 112 mEq/L   CO2 29 19 - 32 mmol/L   Glucose, Bld 113 (H) 70 - 99 mg/dL   BUN 8 6 - 23 mg/dL   Creatinine, Ser 0.85 0.50 - 1.35 mg/dL   Calcium 8.7 8.4 - 10.5 mg/dL   Total Protein 6.6 6.0 - 8.3 g/dL   Albumin 2.8 (L) 3.5 - 5.2 g/dL   AST 16 0 - 37 U/L   ALT 15 0 - 53 U/L   Alkaline Phosphatase 65 39 - 117 U/L   Total Bilirubin 0.5 0.3 - 1.2 mg/dL   GFR calc non Af Amer >90 >90 mL/min   GFR calc Af Amer >90 >90 mL/min    Comment: (NOTE) The eGFR has been calculated using the CKD EPI equation. This calculation has not been validated in all clinical situations. eGFR's persistently <90 mL/min signify possible Chronic Kidney Disease.    Anion gap 9 5 - 15  CBC WITH DIFFERENTIAL     Status: Abnormal   Collection Time: 12/16/14  4:16 AM  Result Value Ref Range   WBC 6.8 4.0 - 10.5 K/uL   RBC 4.06 (L) 4.22 - 5.81 MIL/uL   Hemoglobin 11.9 (L) 13.0 - 17.0 g/dL   HCT 36.9 (L) 39.0 - 52.0 %   MCV 90.9 78.0 - 100.0 fL   MCH 29.3 26.0 - 34.0 pg   MCHC 32.2 30.0 - 36.0 g/dL   RDW 13.2 11.5 -  15.5 %   Platelets 229 150 - 400 K/uL   Neutrophils Relative % 58 43 - 77 %   Neutro Abs 4.0 1.7 - 7.7 K/uL   Lymphocytes Relative 27 12 - 46 %   Lymphs Abs 1.8 0.7 - 4.0 K/uL   Monocytes Relative 11 3 - 12 %   Monocytes Absolute 0.7 0.1 - 1.0 K/uL   Eosinophils Relative 4 0 - 5 %   Eosinophils Absolute 0.3 0.0 - 0.7 K/uL   Basophils Relative 0 0 - 1 %   Basophils Absolute 0.0 0.0 - 0.1 K/uL  Hemoglobin A1c     Status: Abnormal   Collection Time: 12/16/14  4:16 AM  Result Value Ref Range   Hgb A1c MFr Bld 6.3 (H) <5.7 %    Comment: (NOTE)                                                                       According to the ADA Clinical Practice Recommendations for 2011, when HbA1c is used as a screening test:  >=6.5%   Diagnostic of Diabetes Mellitus           (if abnormal result is confirmed) 5.7-6.4%   Increased risk of developing Diabetes  [place] References:Diagnosis and Classification of Diabetes Mellitus,Diabetes ATFT,7322,02(RKYHC 1):S62-S69 and Standards of Medical Care in         Diabetes - 2011,Diabetes Care,2011,34 (Suppl 1):S11-S61.    Mean Plasma Glucose 134 (H) <117 mg/dL    Comment: Performed at [facility]     Status: Abnormal   Collection Time: 12/16/14  4:16 AM  Result Value Ref Range   Prothrombin Time 27.5 (H) 11.6 - 15.2 seconds   INR 2.54 (H) 0.00 - 1.49  Glucose, capillary     Status: Abnormal   Collection Time: 12/16/14  7:05 AM  Result Value Ref Range   Glucose-Capillary 112 (H) 70 - 99 mg/dL  Glucose, capillary     Status: Abnormal   Collection Time: 12/16/14 11:59 AM  Result Value Ref Range   Glucose-Capillary 132 (H) 70 - 99 mg/dL  Glucose, capillary     Status: Abnormal   Collection Time: 12/16/14  4:33 PM  Result Value Ref Range   Glucose-Capillary 116 (H) 70 - 99 mg/dL  Glucose, capillary     Status: Abnormal   Collection Time: 12/16/14  8:42 PM  Result Value Ref Range   Glucose-Capillary 113 (H) 70 - 99 mg/dL  Protime-INR     Status: Abnormal   Collection Time: 12/17/14  6:13 AM  Result Value Ref Range   Prothrombin Time 29.3 (H) 11.6 - 15.2 seconds   INR 2.75 (H) 0.00 - 1.49  Glucose, capillary     Status: Abnormal   Collection Time: 12/17/14  6:28 AM  Result Value Ref Range   Glucose-Capillary 115 (H) 70 - 99 mg/dL  Glucose, capillary     Status: Abnormal   Collection Time: 12/17/14 11:46 AM  Result Value Ref Range   Glucose-Capillary 113 (H) 70 - 99 mg/dL  Glucose, capillary     Status: Abnormal   Collection Time: 12/17/14 11:54 AM  Result Value Ref Range   Glucose-Capillary 123 (H) 70 - 99 mg/dL  Glucose, capillary  Status: Abnormal   Collection Time: 12/17/14  4:28 PM  Result Value Ref Range   Glucose-Capillary 113 (H) 70 - 99 mg/dL  Glucose, capillary     Status: Abnormal   Collection Time: 12/17/14  8:36 PM  Result Value Ref Range    Glucose-Capillary 112 (H) 70 - 99 mg/dL  Glucose, capillary     Status: Abnormal   Collection Time: 12/18/14  6:45 AM  Result Value Ref Range   Glucose-Capillary 103 (H) 70 - 99 mg/dL     HEENT: trach site ok #6 Cardio: RRR and no murmur Resp: CTA B/L and unlabored GI: BS positive and NT, ND Extremity:  Pulses positive and Edema BLE pretib and pedal 2+ Skin:   Other stasis dermatitis bilateral pretibial Neuro: Alert/Oriented, Cranial Nerve II-XII normal, Normal Sensory and Abnormal Motor 4/5 bilateral deltoids, biceps, triceps, grip, hip flexors, knee extensor, ankle dorsiflexor and plantar flexor Musc/Skel:  Normal Gen. no acute distress  Mood and affect appropriate   Assessment/Plan: 1. Functional deficits secondary to encephalopathy and deconditioning secondary to respiratory failure which require 3+ hours per day of interdisciplinary therapy in a comprehensive inpatient rehab setting. Physiatrist is providing close team supervision and 24 hour management of active medical problems listed below. Physiatrist and rehab team continue to assess barriers to discharge/monitor patient progress toward functional and medical goals. FIM: FIM - Bathing Bathing Steps Patient Completed: Chest, Right Arm, Left Arm, Abdomen, Front perineal area, Buttocks, Right upper leg, Left upper leg Bathing: 4: Min-Patient completes 8-9 45f 10 parts or 75+ percent  FIM - Upper Body Dressing/Undressing Upper body dressing/undressing steps patient completed: Thread/unthread right sleeve of pullover shirt/dresss, Thread/unthread left sleeve of pullover shirt/dress, Put head through opening of pull over shirt/dress, Pull shirt over trunk Upper body dressing/undressing: 5: Set-up assist to: Obtain clothing/put away FIM - Lower Body Dressing/Undressing Lower body dressing/undressing steps patient completed: Thread/unthread right pants leg, Thread/unthread left pants leg, Pull pants up/down, Don/Doff left sock Lower  body dressing/undressing: 4: Min-Patient completed 75 plus % of tasks  FIM - Toileting Toileting steps completed by patient: Adjust clothing prior to toileting, Performs perineal hygiene, Adjust clothing after toileting Toileting Assistive Devices: Grab bar or rail for support Toileting: 5: Supervision: Safety issues/verbal cues     FIM - Games developer Transfer: 5: Supine > Sit: Supervision (verbal cues/safety issues), 5: Sit > Supine: Supervision (verbal cues/safety issues), 4: Bed > Chair or W/C: Min A (steadying Pt. > 75%), 4: Chair or W/C > Bed: Min A (steadying Pt. > 75%)  FIM - Locomotion: Wheelchair Distance: 150 Locomotion: Wheelchair: 5: Travels 150 ft or more: maneuvers on rugs and over door sills with supervision, cueing or coaxing FIM - Locomotion: Ambulation Locomotion: Ambulation Assistive Devices: Other (comment), Walker - Rolling Ambulation/Gait Assistance: 5: Supervision, 4: Min guard Locomotion: Ambulation: 2: Travels 50 - 149 ft with minimal assistance (Pt.>75%)  Comprehension Comprehension Mode: Auditory Comprehension: 5-Follows basic conversation/direction: With no assist  Expression Expression Mode: Verbal Expression Assistive Devices: 6-Talk trach valve Expression: 5-Expresses basic needs/ideas: With no assist  Social Interaction Social Interaction: 5-Interacts appropriately 90% of the time - Needs monitoring or encouragement for participation or interaction.  Problem Solving Problem Solving: 5-Solves basic problems: With no assist  Memory Memory: 5-Recognizes or recalls 90% of the time/requires cueing < 10% of the time   Medical Problem List and Plan: 1. Functional deficits secondary to Encephalopathy past respiratory failure 2. RUE DVT treatment/ Anticoagulation: Pharmaceutical: Coumadin, ? If this is needed  given brachial, ? At former PICC site  3. Pain Management: Will change oxycodone to prn as denies pain at this moment.  4.  Anxiety disorder/Mood: Continue celexa daily with xanax bid. LCSW to follow with patient and family for evaluation and support.  5. Neuropsych: This patient is capable of making decisions on her own behalf. 6. Skin/Wound Care: Routine pressure relief measures. Monitor PEG and trach site every shift.  7. Fluids/Electrolytes/Nutrition: Check daily weights. Monitor for signs of overload. Encourage appropriate diet. Add protein supplement to help with edema. reduce H20 Flush as po increases 8. OSA/OHS: Reports chronic SOB as well as non-compliance. Will need to lose weight prior to decannulation. PMSV with supervision and keep trach open at nights.  9. Diastolic CHF: Monitor daily weights. Low salt diet. Continue lasix, lisinopril and metoprolol.  10. COPD: Educated on tobacco cessation. Will add nebs qid for now.  LOS (Days) 3 A FACE TO FACE EVALUATION WAS PERFORMED  Belle Charlie E 12/18/2014, 8:26 AM

## 2014-12-18 NOTE — Plan of Care (Signed)
Problem: Food- and Nutrition-Related Knowledge Deficit (NB-1.1) Goal: Nutrition education Formal process to instruct or train a patient/client in a skill or to impart knowledge to help patients/clients voluntarily manage or modify food choices and eating behavior to maintain or improve health. Outcome: Completed/Met Date Met:  12/18/14  RD consulted for nutrition education regarding diabetes.     Lab Results  Component Value Date    HGBA1C 6.3* 12/16/2014    Pt has borderline diabetes. RD provided "Carbohydrate Counting for People with Diabetes" handout from the Academy of Nutrition and Dietetics and "Plate Method Healthy Eating" handout. Discussed different food groups and their effects on blood sugar, emphasizing carbohydrate-containing foods. Provided list of carbohydrates and recommended serving sizes of common foods.  Discussed importance of watching out for the portion sizes of carbohydrates eaten. Encouraged consumption of fresh vegetables and fruit. Encouraged adequate protein intake. Provided examples of ways to balance meals/snacks and encouraged intake of high-fiber, whole grain complex carbohydrates. Discussed healthy meal options and the importance of a low sodium diet. Teach back method used.  Expect good compliance.  Andrew Locks, MS, RD, LDN Pager # [phone] After hours/ weekend pager # [phone]

## 2014-12-18 NOTE — Progress Notes (Signed)
Occupational Therapy Session Note  Patient Details  Name: Andrew Sparks MRN: [id number] Date of Birth: 1966-11-08  Today's Date: 12/18/2014 OT Individual Time: [id number] OT Individual Time Calculation (min): 60 min    Short Term Goals: Week 1:  OT Short Term Goal 1 (Week 1): STGs = LTGs secondary to estimated short length of stay  Skilled Therapeutic Interventions/Progress Updates:  Upon entering the room, pt seated in recliner chair awaiting therapist. Pt with no c/o pain this session. Pt ambulated with around room to obtain all needed items for B & D session without use of AD and close supervision. Pt performed bathing and dressing with STS from chair at sink side. Pt requiring 3 rest breaks during session secondary to increased fatigue. Pt remains on O2 during session but stats remaining above 93 % throughout session. Pt reporting that he feels more anxious that SOB during session. OT provided and demonstrated us of LH sponge to wash B feet and lower legs. Pt required close supervision overall for all tasks this session. Goals upgraded to Mod I for self care and Supervision goal remains for simple meal prep goal. OT wrapped B LEs from mid foot to mid thigh. Pt seated in recliner chair with call bell and all needed items within reach.   Therapy Documentation Precautions:  Precautions Precautions: Fall, Other (comment) Precaution Comments: trach- monitor O2 levels Restrictions Weight Bearing Restrictions: No Vital Signs: Therapy Vitals Pulse Rate: 65 Resp: 18 Patient Position (if appropriate): Sitting Oxygen Therapy SpO2: 97 % O2 Device: Not Delivered  See FIM for current functional status  Therapy/Group: Individual Therapy  [name], Kamea Dacosta L 12/18/2014, 9:57 AM

## 2014-12-18 NOTE — Plan of Care (Signed)
Problem: RH Balance Goal: LTG Patient will maintain dynamic standing with ADLs (OT) LTG: Patient will maintain dynamic standing balance with assist during activities of daily living (OT)  Goals upgraded as pt making continued progress towards goals  Problem: RH Bathing Goal: LTG Patient will bathe with assist, cues/equipment (OT) LTG: Patient will bathe specified number of body parts with assist with/without cues using equipment (position) (OT)  Goals upgraded as pt making continued progress towards goals  Problem: RH Dressing Goal: LTG Patient will perform lower body dressing w/assist (OT) LTG: Patient will perform lower body dressing with assist, with/without cues in positioning using equipment (OT)  Goals upgraded as pt making continued progress towards goals

## 2014-12-18 NOTE — Progress Notes (Signed)
Physical Therapy Session Note  Patient Details  Name: Andrew Sparks MRN: [id number] Date of Birth: Jun 01, 1966  Today's Date: 12/18/2014 PT Individual Time: 0950-1050 PT Individual Time Calculation (min): 60 min   Short Term Goals: Week 1:  PT Short Term Goal 1 (Week 1): =LTG's due to ELOS  Skilled Therapeutic Interventions/Progress Updates:   Patient received sitting in recliner with RN present, pt c/o not being able to swallow and trach being too tight. Respiratory therapy notified and adjusted patient's trach.RN notified this therapist that patient on room air per PA [name]. Pt agreeable to therapy after finished with respiratory. Focus on endurance and dynamic standing balance. Gait training room > therapy gym x 150 ft without AD, supervision with pt running into obstacles x 2 on left side requiring steadying assist. Pt required prolonged seated rest break to recover following ambulation. For LE strengthening and activity tolerance, 2 trials of 5x Sit to Stand without UE support = 16 sec and 14 sec, respectively. Pt performed SLS with LUE support each LE, up to 5 sec on RLE and <3 sec on LLE. Patient ambulated short distance while tossing/catching ball with increased SOB requiring seated rest after 20 ft. Pt ambulated from therapy gym > ADL apartment > ortho gym with seated rest break due to fatigue/SOB with supervision. Pt performed car transfer to sedan height with supervision and verbal cues for safe technique. After seated rest, pt negotiated up/down 3 stairs using 2 rails with supervision, verbal cues for step-to pattern. Pt ambulated ortho gym > therapy gym without rest and no LOB with supervision. Pt participated in game of horseshoes and ambulated to retrieve horseshoes from floor with min A, requiring immediate seated rest due to SOB with squatting. Pt able to ambulate back to room with supervision and 1 seated rest. Patient left sitting in recliner with needs within reach.   Therapy  Documentation Precautions:  Precautions Precautions: Fall, Other (comment) Precaution Comments: trach- monitor O2 levels Restrictions Weight Bearing Restrictions: No Vital Signs: Therapy Vitals Pulse Rate: 65 Resp: 18 Patient Position (if appropriate): Sitting Oxygen Therapy SpO2: 97 % O2 Device: Not Delivered Pain:  Swallowing discomfort, RN and RT notified and adjusted trach  See FIM for current functional status  Therapy/Group: Individual Therapy  Kerney ElbeVarner, Staley Budzinski A 12/18/2014, 10:54 AM

## 2014-12-18 NOTE — Progress Notes (Signed)
ETCO2 did not change color.  Made sure trach was in place and secure.  Pt possibly needs to be assessed for XLT?

## 2014-12-18 NOTE — Progress Notes (Signed)
Follow up for order to downsize Trach.  Unable to downsize due to possible displacement of Trach.  Pt sitting at bedside no distress.  SPO2 95% HR 65.  RT unable to pass suction cath and ETCO2 did not have color change.  Pt able to talk without PMV.  Pt said trach has been like this (able to talk around and protruding out )since he has had it.  RN was informed and RN informed PA.Marland Kitchen.  PA to call CCM for assessment.

## 2014-12-18 NOTE — Progress Notes (Signed)
RT entered room and patient was able to verbalize without PMV. Andrew Sparks appears to be in ;RT used CO2 detector and there was no color change. Patient vital signs and O2 sats are WNL on room air. RT will continue to monitor.

## 2014-12-18 NOTE — Consult Note (Signed)
Name: Andrew Sparks MRN: [id number] DOB: 11-28-1966    ADMISSION DATE:  12/15/2014 CONSULTATION DATE:  12/18/2014  REFERRING MD :  Rehab  CHIEF COMPLAINT:  Airway compromise  BRIEF PATIENT DESCRIPTION: 49 year old male with PMH of morbid obesity, OSA, OHS, hypersomnia and COPD who continues to smoke.  Was admitted with COPD exacerbation and acute diastolic heart failure.  The patient was admitted to [facility] where he was intubated.  Had difficulty weaning from the ventilator and was trached by ENT on 12/07/14.  Cuffed size 6 shiley was left in the patient and he was transferred to CIR.  In CIR the patient dislodged his tracheostomy and they were unable to replace it.  PCCM was called on consultation to address airway compromise.  SIGNIFICANT EVENTS  1/4 Trach placed. 1/15 Trach dislodged and PCCM replaced.  PAST MEDICAL HISTORY :   has a past medical history of Anxiety; Morbid obesity; Obesity hypoventilation syndrome; Hypersomnia with sleep apnea; Hypertension; Stroke; Sleep apnea; Shortness of breath dyspnea; and Depression.  has no past surgical history on file. Prior to Admission medications   Medication Sig Start Date End Date Taking? Authorizing Provider  acetaminophen (TYLENOL) 325 MG tablet Take 650 mg by mouth every 4 (four) hours as needed (pain/fever).   Yes Historical Provider, MD  ALPRAZolam (XANAX) 0.25 MG tablet Take 0.25 mg by mouth 2 (two) times daily.   Yes Historical Provider, MD  citalopram (CELEXA) 20 MG tablet Take 20 mg by mouth daily.   Yes Historical Provider, MD  cloNIDine (CATAPRES) 0.1 MG tablet Take 0.1 mg by mouth 3 (three) times daily.   Yes Historical Provider, MD  famotidine (PEPCID) 20 MG tablet Take 20 mg by mouth 2 (two) times daily.   Yes Historical Provider, MD  furosemide (LASIX) 40 MG tablet Take 40 mg by mouth daily.   Yes Historical Provider, MD  hydrALAZINE (APRESOLINE) 100 MG tablet Take 100 mg by mouth 3 (three) times daily.   Yes Historical  Provider, MD  lisinopril (PRINIVIL,ZESTRIL) 40 MG tablet Take 40 mg by mouth 2 (two) times daily.   Yes Historical Provider, MD  metoprolol (LOPRESSOR) 50 MG tablet Take 50 mg by mouth 3 (three) times daily.   Yes Historical Provider, MD  nitroGLYCERIN (NITROGLYN) 2 % ointment Apply 1 inch topically 2 (two) times daily.   Yes Historical Provider, MD  Nutritional Supplements (FEEDING SUPPLEMENT, JEVITY 1.5 CAL,) LIQD Place 360 mLs into feeding tube 4 (four) times daily.   Yes Historical Provider, MD  oxyCODONE (OXY IR/ROXICODONE) 5 MG immediate release tablet Take 5 mg by mouth every 6 (six) hours.   Yes Historical Provider, MD  potassium chloride SA (K-DUR,KLOR-CON) 20 MEQ tablet Take 20 mEq by mouth daily.   Yes Historical Provider, MD  scopolamine (TRANSDERM-SCOP) 1 MG/3DAYS Place 1 patch onto the skin every 3 (three) days.   Yes Historical Provider, MD  warfarin (COUMADIN) 7.5 MG tablet Take 7.5 mg by mouth daily.   Yes Historical Provider, MD  Water For Irrigation, Sterile (STERILE WATER FOR IRRIGATION) Place 275 mLs into feeding tube 3 (three) times daily.   Yes Historical Provider, MD   No Known Allergies  FAMILY HISTORY:  family history is not on file. SOCIAL HISTORY:  reports that he has been smoking Cigarettes.  He has a 14 pack-year smoking history. He does not have any smokeless tobacco history on file. He reports that he drinks about 4.8 oz of alcohol per week. He reports that he does  not use illicit drugs.  REVIEW OF SYSTEMS:   Constitutional: Negative for fever, chills, weight loss, malaise/fatigue and diaphoresis.  HENT: Negative for hearing loss, ear pain, nosebleeds, congestion, sore throat, neck pain, tinnitus and ear discharge.   Eyes: Negative for blurred vision, double vision, photophobia, pain, discharge and redness.  Respiratory: Negative for cough, hemoptysis, sputum production, shortness of breath, wheezing and stridor.   Cardiovascular: Negative for chest pain,  palpitations, orthopnea, claudication, leg swelling and PND.  Gastrointestinal: Negative for heartburn, nausea, vomiting, abdominal pain, diarrhea, constipation, blood in stool and melena.  Genitourinary: Negative for dysuria, urgency, frequency, hematuria and flank pain.  Musculoskeletal: Negative for myalgias, back pain, joint pain and falls.  Skin: Negative for itching and rash.  Neurological: Negative for dizziness, tingling, tremors, sensory change, speech change, focal weakness, seizures, loss of consciousness, weakness and headaches.  Endo/Heme/Allergies: Negative for environmental allergies and polydipsia. Does not bruise/bleed easily.  SUBJECTIVE: C/o SOB.  VITAL SIGNS: Temp:  [98.8 F (37.1 C)-98.9 F (37.2 C)] 98.9 F (37.2 C) (01/15 0503) Pulse Rate:  [55-65] 62 (01/15 1152) Resp:  [18] 18 (01/15 1152) BP: (160-169)/(89-112) 163/89 mmHg (01/15 0536) SpO2:  [91 %-98 %] 97 % (01/15 0948) FiO2 (%):  [28 %] 28 % (01/15 0503) Weight:  [132.904 kg (293 lb)] 132.904 kg (293 lb) (01/15 0554)  PHYSICAL EXAMINATION: General:  Chronically ill appearing morbidly obese male, trach is through the skin but not into the airway. Neuro:  Alert and interactive.  Follows all commands. HEENT:  Crane/AT, PERRL, EOM-I and MMM. Cardiovascular:  RRR, Nl S1/S2, -M/R/G. Lungs:  Decreased BS at the bases, air movement through the mouth and nose not trach site, coarse BS in all lung field. Abdomen:  Soft, NT, ND and +BS. Musculoskeletal:  -edema and -tenderness. Skin:  Intact.  Stoma open but no direct route visualized into the airway.   Recent Labs Lab 12/16/14 0416 12/18/14 0735  NA 133* 135  K 3.2* 3.6  CL 95* 98  CO2 29 28  BUN 8 5*  CREATININE 0.85 0.79  GLUCOSE 113* 107*    Recent Labs Lab 12/16/14 0416  HGB 11.9*  HCT 36.9*  WBC 6.8  PLT 229   No results found.  ASSESSMENT / PLAN:  49 year old male with extensive pulmonary history as above presenting to PCCM after  dislodging his trach.  The trach is 9611 days old and have not been changed prior.  Stoma appears intact however, patient has to be positioned with his head very extended for replacing it.  Plan: - Will replace trach today (see procedure note). - In approximately one week will need the trach changed to a cuffless 6 shiley as the track should be mature by then. - No O2 required. - Will need very aggressive weight loss. - Smoking cessation.  Alyson ReedyWesam G. Damon Hargrove, M.D. [facility]. Pager: [phone]. After hours pager: [phone].  12/18/2014, 12:58 PM

## 2014-12-18 NOTE — Procedures (Signed)
First Trach Change  Size 6 cuffed tracheostomy placed 1/4.  Patient cuffed tracheostomy.  Obturator was used with some dilation of the stoma.  Obturator placed in trach then size 6 cuffed trach was placed with good air movement and good color change.  CXR ordered and pending.  Patient stable.  Alyson ReedyWesam G. Brittanee Ghazarian, M.D. [facility]. Pager: [phone]. After hours pager: [phone].

## 2014-12-18 NOTE — Progress Notes (Signed)
Pt able to verbalize clearly without PMV.  Andrew Sparks appears to be in same position as last check at 11:50

## 2014-12-18 NOTE — Progress Notes (Signed)
Upon initial assessment of pt noted trach extending from ostomy site and attempted to readjust trach ties tighter however unable to move trach closer to neck and pt noted trach ties too tight and requested loosened. Resp notified of findings asked to see pt re findings. Pt in no apparent distress nad noted he was able to talk without PMV on and trach had sat out since admit. PAC in room to see pt and new orders received to downsize trach however resp did not feel trach position was such that it warranted downsize rather repositioning. 02 sats are good and pt able to cough small amount of secretions through mouth.. Inner cannula with secretions in it was changed out and PMV replaced. PAC notified of questionable placement of the trach. Pamelia HoitSharp, Nataleah Scioneaux B

## 2014-12-18 NOTE — Progress Notes (Signed)
Speech Language Pathology Daily Session Note  Patient Details  Name: Andrew Sparks MRN: [id number] Date of Birth: June 01, 1966  Today's Date: 12/18/2014 SLP Individual Time: 1415-1515 SLP Individual Time Calculation (min): 60 min  Short Term Goals: Week 1: SLP Short Term Goal 1 (Week 1): STGs=LTGs due to length of stay  Skilled Therapeutic Interventions: Skilled treatment session focused on speech and dysphagia goals. Upon arrival, patient was sitting upright in the recliner with PMSV donned and agreeable to participate in treatment session. Patient's PMSV was donned for entire session and all vitals remained WFL. Patient was able to demonstrate adequate phonation and was 100% intelligible. Patient also independently donned/doffed his PMSV and recalled safety precautions (wear gloves, remove when sleeping, etc) with Mod I but required Mod A multimodal cues to recall how to appropriately clean the valve.  Patient reported "pain and difficulty swallowing" due to recent trach change due to dislodgement and dilation of the stoma per PCCM note, however, he was agreeable to trials of regular textures. Patient demonstrated efficient mastication with a delayed cough and immediate throat clear with trials, difficult to differentiate if difficulty is due to swallowing or recent procedure, therefore, recommend patient continue current diet. Patient left upright in recliner with all needs within reach. Continue with current plan of care.    FIM:  Comprehension Comprehension Mode: Auditory Comprehension: 5-Follows basic conversation/direction: With extra time/assistive device Expression Expression Mode: Verbal Expression Assistive Devices: 6-Talk trach valve Expression: 5-Expresses basic 90% of the time/requires cueing < 10% of the time. Social Interaction Social Interaction: 6-Interacts appropriately with others with medication or extra time (anti-anxiety, antidepressant). Problem Solving Problem  Solving: 5-Solves basic 90% of the time/requires cueing < 10% of the time Memory Memory: 5-Recognizes or recalls 90% of the time/requires cueing < 10% of the time FIM - Eating Eating Activity: 5: Supervision/cues;5: Set-up assist for open containers  Pain Pain Assessment Pain Assessment: 0-10 Pain Score: 3  Pain Location: Neck Pain Orientation: Mid Pain Descriptors / Indicators: Aching;Sore Pain Onset: Other (Comment) (replaced trach in correct position) Patients Stated Pain Goal: 3 Pain Intervention(s): Medication (See eMAR)  Therapy/Group: Individual Therapy  Andrew Sparks 12/18/2014, 4:47 PM

## 2014-12-18 NOTE — Consult Note (Signed)
[facility]  Patient name: Andrew Sparks  REASON FOR CONSULT: Right upper extremity DVT  HPI: Andrew Sparks is a 49 y.o. Sparks who was admitted to rehabilitation on 12/16/2014 for acute encephalopathy after respiratory failure. He reportedly had a DVT involving the right brachial vein and for this reason vascular surgery was consult and concerning recommendations for anticoagulation.  He tells me that he had an ultrasound of his right arm at another hospital. I cannot find record of an ultrasound done here. In reviewing his records, it appears that he had a D[facility] in the right [facility] He was started on IV heparin and then placed on Coumadin. He is unaware of any previous history of DVT. He currently denies significant shortness of breath except with exertion. He complains of some swelling in the right arm is not having significant pain in the right arm.  Past Medical History  Diagnosis Date  . Anxiety   . Morbid obesity   . Obesity hypoventilation syndrome   . Hypersomnia with sleep apnea   . Hypertension   . Stroke   . Sleep apnea   . Shortness of breath dyspnea   . Depression    FAMILY HISTORY: There is no family history of premature cardiovascular disease.  SOCIAL HISTORY: History  Substance Use Topics  . Smoking status: Current Every Day Smoker -- 0.50 packs/day for 28 years    Types: Cigarettes  . Smokeless tobacco: Not on file  . Alcohol Use: 4.8 oz/week    8 Cans of beer per week    No Known Allergies   REVIEW OF SYSTEMS: Arly.Keller[X ] denotes positive finding; [  ] denotes negative finding CARDIOVASCULAR:  [ ]  chest pain   [ ]  chest pressure   [ ]  palpitations   [ ]  orthopnea   Arly.Keller[X ] dyspnea on exertion   [ ]  claudication   [ ]  rest pain   Arly.Keller[X ] DVT   [ ]  phlebitis PULMONARY:   [ ]  productive cough   [ ]  asthma   [ ]  wheezing NEUROLOGIC:   [ ]  weakness  [ ]   paresthesias  [ ]  aphasia  [ ]  amaurosis  [ ]  dizziness HEMATOLOGIC:   [ ]  bleeding problems   [ ]  clotting disorders MUSCULOSKELETAL:  Arly.Keller[X ] joint pain   [ ]  joint swelling [ ]  leg swelling GASTROINTESTINAL: [ ]   blood in stool  [ ]   hematemesis GENITOURINARY:  [ ]   dysuria  [ ]   hematuria PSYCHIATRIC:  [ ]  history of major depression INTEGUMENTARY:  [ ]  rashes  [ ]  ulcers CONSTITUTIONAL:  [ ]  fever   [ ]  chills  PHYSICAL EXAM: Filed Vitals:   12/18/14 1152 12/18/14 1300 12/18/14 1357 12/18/14 1417  BP:    154/87  Pulse: 62   65  Temp:    98.9 F (37.2 C)  TempSrc:    Oral  Resp: 18   22  Height:      Weight:      SpO2:  94% 92% 99%   Body mass index is 42.04 kg/(m^2). GENERAL: The patient is a well-nourished Sparks, in no acute distress. He has a tracheostomy.The vital signs are documented above. CARDIOVASCULAR: There is a regular rate and rhythm. I do not detect carotid bruits. He has palpable radial pulses. Both upper extremities are swollen but more significantly on the right side. PULMONARY: There  is good air exchange bilaterally without wheezing or rales. ABDOMEN: Soft and non-tender with normal pitched bowel sounds.  MUSCULOSKELETAL: There are no major deformities or cyanosis. NEUROLOGIC: No focal weakness or paresthesias are detected. SKIN: There are no ulcers or rashes noted. PSYCHIATRIC: The patient has a normal affect.  DATA:  Lab Results  Component Value Date   WBC 6.8 12/16/2014   HGB 11.9* 12/16/2014   HCT 36.9* 12/16/2014   MCV 90.9 12/16/2014   PLT 229 12/16/2014   Lab Results  Component Value Date   NA 135 12/18/2014   K 3.6 12/18/2014   CL 98 12/18/2014   CO2 28 12/18/2014   Lab Results  Component Value Date   CREATININE 0.79 12/18/2014   Lab Results  Component Value Date   INR 3.11* 12/18/2014   INR 2.75* 12/17/2014   INR 2.54* 12/16/2014   Lab Results  Component Value Date   HGBA1C 6.3* 12/16/2014   CBG (last 3)   Recent Labs   12/17/14 2036 12/18/14 0645 12/18/14 1127  GLUCAP 112* 103* 111*   Venous duplex scan at [facility] showed a right upper extremity DVT involving the brachial vein. I do not have this actual report.  MEDICAL ISSUES:  RIGHT UPPER EXTREMITY DVT: Given that he had a right upper extremity DVT documented at [facility], I would recommend 3 months of Coumadin. I would also encourage him to elevate his arm when in bed. I do not think that a follow up study is necessary unless he has any significant increase in the swelling. Vascular surgery will be available as needed.  Pal Shell S [facility] [place]: [phone]

## 2014-12-19 ENCOUNTER — Inpatient Hospital Stay (HOSPITAL_COMMUNITY): Payer: Medicaid Other | Admitting: Speech Pathology

## 2014-12-19 ENCOUNTER — Inpatient Hospital Stay (HOSPITAL_COMMUNITY): Payer: Self-pay | Admitting: Speech Pathology

## 2014-12-19 ENCOUNTER — Inpatient Hospital Stay (HOSPITAL_COMMUNITY): Payer: Medicaid Other | Admitting: Occupational Therapy

## 2014-12-19 ENCOUNTER — Inpatient Hospital Stay (HOSPITAL_COMMUNITY): Payer: Medicaid Other | Admitting: Physical Therapy

## 2014-12-19 LAB — GLUCOSE, CAPILLARY
GLUCOSE-CAPILLARY: 105 mg/dL — AB (ref 70–99)
Glucose-Capillary: 96 mg/dL (ref 70–99)
Glucose-Capillary: 99 mg/dL (ref 70–99)
Glucose-Capillary: 109 mg/dL — ABNORMAL HIGH (ref 70–99)

## 2014-12-19 LAB — PROTIME-INR
INR: 2.88 — AB (ref 0.00–1.49)
Prothrombin Time: 30.4 seconds — ABNORMAL HIGH (ref 11.6–15.2)

## 2014-12-19 MED ORDER — WARFARIN SODIUM 3 MG PO TABS
3.0000 mg | ORAL_TABLET | Freq: Once | ORAL | Status: AC
  Administered 2014-12-19: 3 mg via ORAL
  Filled 2014-12-19: qty 1

## 2014-12-19 NOTE — Progress Notes (Signed)
Physical Therapy Session Note  Patient Details  Name: [name] MRN: [id number] Date of Birth: 04-24-1966  Today's Date: 12/19/2014 PT Individual Time: 0900-1000  PT Individual Time Calculation (min): 60 min   Short Term Goals: Week 1:  PT Short Term Goal 1 (Week 1): =LTG's due to ELOS  Skilled Therapeutic Interventions/Progress Updates:  Pt was seen bedside in the am. Pt connected to portable O2, FiO2 28%. Pt performed multiple sit to stand transfers with S. Pt ambulated 150 feet x 2 without assistive device with S, requires assist with O2 tank. Pt performed sit to stand transfers 5 x 2 with S. Pt ascended/descended 4" step without rails x 2 with S to min guard. Pt performed stair taps 3 sets x 10 reps each for strengthening. Pt returned to room and reconnected to wall O2.   Therapy Documentation Precautions:  Precautions Precautions: Fall, Other (comment) Precaution Comments: trach- monitor O2 levels Restrictions Weight Bearing Restrictions: No General:   Pain: Pt c/o back pain 3/10.    Locomotion : Ambulation Ambulation/Gait Assistance: 5: Supervision   See FIM for current functional status  Therapy/Group: Individual Therapy  Rayford HalstedMitchell, Morrill Bomkamp G 12/19/2014, 12:35 PM

## 2014-12-19 NOTE — Progress Notes (Signed)
Patient ID: Andrew Sparks, male   DOB: [birth year], 49 y.o.   MRN: [id number]  12/19/14.  Admit for CIR with  functional deficits secondary to Encephalopathy and respiratory failure.  50 year old morbidly obese male with h/o OSA/OHS, hypersomnia, COPD with ongoing tobacco use, CHF, HTN who was admitted to [facility] on 77/82/42 with metabolic acidosis and decrease in LOC with encephalopathy due to COPD exacerbation with acute diastolic CHF. He declined in ED requiring intubation and was started on IV solumedrol, IV diuretics for fluid overload as well as IV antibiotics for strep PNA.Marland Kitchen He self extubated on  He was reintubated and Hospital course complicated by elevated cardiac enzymes due to medical issues, accelerated HTN requiring multiple medications for control, SVT as well as development of right brachial DVT. He was started on IV heparin for DVT and transition to coumadin. He had difficulty weaning off vent and required PEG placement on 12/01/14 as well as trach by ENT on 12/07/14. He was started on tube feeds but developed N/V due to ileus which has resolved  Subjective/Complaints: Breathing ok, awakened by oximetry alarm last noc.  Getting acclimated to alarms and sleepy better. Optimistic about anticipated d/c in 3 days  Past Medical History  Diagnosis Date  . Anxiety   . Morbid obesity   . Obesity hypoventilation syndrome   . Hypersomnia with sleep apnea   . Hypertension   . Stroke   . Sleep apnea   . Shortness of breath dyspnea   . Depression    Patient Vitals for the past 24 hrs:  BP Temp Temp src Pulse Resp SpO2 Weight  12/19/14 0818 - - - 68 20 93 % -  12/19/14 0548 (!) 170/98 mmHg - - 64 - - -  12/19/14 0541 (!) 172/106 mmHg 97.7 F (36.5 C) Oral 66 20 94 % -  12/19/14 0500 - - - - - - 132.6 kg (292 lb 5.3 oz)  12/19/14 0358 - - - 68 20 96 % -  12/19/14 0020 - - - 70 18 95 % -  12/18/14 2040 - - - 74 20 96 % -  12/18/14 1743 - - - 63 - 92 % -  12/18/14 1612 - - - 62  18 95 % -  12/18/14 1417 (!) 154/87 mmHg 98.9 F (37.2 C) Oral 65 (!) 22 99 % -  12/18/14 1357 - - - - - 92 % -  12/18/14 1300 - - - - - 94 % -  12/18/14 1152 - - - 62 18 - -      Intake/Output Summary (Last 24 hours) at 12/19/14 1006 Last data filed at 12/19/14 0800  Gross per 24 hour  Intake   1400 ml  Output      0 ml  Net   1400 ml    Lab Results  Component Value Date   INR 2.88* 12/19/2014   INR 3.11* 12/18/2014   INR 2.75* 12/17/2014   Lab Results  Component Value Date   HGBA1C 6.3* 12/16/2014    CBG (last 3)   Recent Labs  12/18/14 1642 12/18/14 2100 12/19/14 0646  GLUCAP 108* 102* 96    Review of Systems - Negative except some blood per rectum with BM Objective: Vital Signs: Blood pressure 170/98, pulse 68, temperature 97.7 F (36.5 C), temperature source Oral, resp. rate 20, height 5' 10" (1.778 m), weight 132.6 kg (292 lb 5.3 oz), SpO2 93 %. Dg Chest Port 1 View  12/18/2014   CLINICAL  DATA:  trach replaced.  Shortness of breath.  EXAM: PORTABLE CHEST - 1 VIEW  COMPARISON:  12/15/2014  FINDINGS: Tracheostomy tube again noted, projecting over the mid trachea at the level of the thoracic inlet. Cardiomegaly. No effusions or focal airspace opacity. No edema. No pneumothorax. No acute bony abnormality.  IMPRESSION: Tracheostomy tube stable in position.  No pneumothorax.  Stable cardiomegaly.   Electronically Signed   By: Rolm Baptise M.D.   On: 12/18/2014 13:45   Results for orders placed or performed during the hospital encounter of 12/15/14 (from the past 72 hour(s))  Glucose, capillary     Status: Abnormal   Collection Time: 12/16/14 11:59 AM  Result Value Ref Range   Glucose-Capillary 132 (H) 70 - 99 mg/dL  Glucose, capillary     Status: Abnormal   Collection Time: 12/16/14  4:33 PM  Result Value Ref Range   Glucose-Capillary 116 (H) 70 - 99 mg/dL  Glucose, capillary     Status: Abnormal   Collection Time: 12/16/14  8:42 PM  Result Value Ref Range    Glucose-Capillary 113 (H) 70 - 99 mg/dL  Protime-INR     Status: Abnormal   Collection Time: 12/17/14  6:13 AM  Result Value Ref Range   Prothrombin Time 29.3 (H) 11.6 - 15.2 seconds   INR 2.75 (H) 0.00 - 1.49  Glucose, capillary     Status: Abnormal   Collection Time: 12/17/14  6:28 AM  Result Value Ref Range   Glucose-Capillary 115 (H) 70 - 99 mg/dL  Glucose, capillary     Status: Abnormal   Collection Time: 12/17/14 11:46 AM  Result Value Ref Range   Glucose-Capillary 113 (H) 70 - 99 mg/dL  Glucose, capillary     Status: Abnormal   Collection Time: 12/17/14 11:54 AM  Result Value Ref Range   Glucose-Capillary 123 (H) 70 - 99 mg/dL  Glucose, capillary     Status: Abnormal   Collection Time: 12/17/14  4:28 PM  Result Value Ref Range   Glucose-Capillary 113 (H) 70 - 99 mg/dL  Glucose, capillary     Status: Abnormal   Collection Time: 12/17/14  8:36 PM  Result Value Ref Range   Glucose-Capillary 112 (H) 70 - 99 mg/dL  Glucose, capillary     Status: Abnormal   Collection Time: 12/18/14  6:45 AM  Result Value Ref Range   Glucose-Capillary 103 (H) 70 - 99 mg/dL  Protime-INR     Status: Abnormal   Collection Time: 12/18/14  7:35 AM  Result Value Ref Range   Prothrombin Time 32.2 (H) 11.6 - 15.2 seconds   INR 3.11 (H) 0.00 - 0.10  Basic metabolic panel     Status: Abnormal   Collection Time: 12/18/14  7:35 AM  Result Value Ref Range   Sodium 135 135 - 145 mmol/L    Comment: Please note change in reference range.   Potassium 3.6 3.5 - 5.1 mmol/L    Comment: Please note change in reference range.   Chloride 98 96 - 112 mEq/L   CO2 28 19 - 32 mmol/L   Glucose, Bld 107 (H) 70 - 99 mg/dL   BUN 5 (L) 6 - 23 mg/dL   Creatinine, Ser 0.79 0.50 - 1.35 mg/dL   Calcium 9.0 8.4 - 10.5 mg/dL   GFR calc non Af Amer >90 >90 mL/min   GFR calc Af Amer >90 >90 mL/min    Comment: (NOTE) The eGFR has been calculated using the CKD EPI equation.  This calculation has not been validated in all  clinical situations. eGFR's persistently <90 mL/min signify possible Chronic Kidney Disease.    Anion gap 9 5 - 15  Glucose, capillary     Status: Abnormal   Collection Time: 12/18/14 11:27 AM  Result Value Ref Range   Glucose-Capillary 111 (H) 70 - 99 mg/dL   Comment 1 Notify RN   Glucose, capillary     Status: Abnormal   Collection Time: 12/18/14  4:42 PM  Result Value Ref Range   Glucose-Capillary 108 (H) 70 - 99 mg/dL   Comment 1 Notify RN   Glucose, capillary     Status: Abnormal   Collection Time: 12/18/14  9:00 PM  Result Value Ref Range   Glucose-Capillary 102 (H) 70 - 99 mg/dL   Comment 1 Notify RN   Protime-INR     Status: Abnormal   Collection Time: 12/19/14  6:40 AM  Result Value Ref Range   Prothrombin Time 30.4 (H) 11.6 - 15.2 seconds   INR 2.88 (H) 0.00 - 1.49  Glucose, capillary     Status: None   Collection Time: 12/19/14  6:46 AM  Result Value Ref Range   Glucose-Capillary 96 70 - 99 mg/dL   Comment 1 Notify RN      HEENT: trach site ok #6 Cardio: RRR and no murmur Resp: CTA B/L and unlabored GI: BS positive and NT, ND Extremity:  Pulses positive and Edema BLE pretib and pedal 2+ Skin:   Other stasis dermatitis bilateral pretibial Neuro: Alert/Oriented, Cranial Nerve II-XII normal, Normal Sensory and Abnormal Motor 4/5 bilateral deltoids, biceps, triceps, grip, hip flexors, knee extensor, ankle dorsiflexor and plantar flexor Musc/Skel:  Normal Gen. no acute distress  Mood and affect appropriate    Medical Problem List and Plan: 1. Functional deficits secondary to Encephalopathy past respiratory failure 2. RUE DVT treatment/ Anticoagulation: Pharmaceutical: Coumadin, ? If this is needed given brachial, ? At former PICC site  3. Pain Management: Will change oxycodone to prn as denies pain at this moment.  4. HTN-  Elevated today; will continue to track and titrate BP meds if remains elevated 5. DM- controlled.  Continue Novolog 6. OSA/OHS: Reports  chronic SOB as well as non-compliance. Will need to lose weight prior to decannulation. PMSV with supervision and keep trach open at nights.  7.  Diastolic CHF: Monitor daily weights. Low salt diet. Continue lasix, lisinopril and metoprolol.  8. COPD: Educated on tobacco cessation. Will add nebs qid for now.  LOS (Days) 4 A FACE TO FACE EVALUATION WAS PERFORMED  Nyoka Cowden 12/19/2014, 10:02 AM

## 2014-12-19 NOTE — Progress Notes (Signed)
Speech Language Pathology Daily Session Note  Patient Details  Name: Andrew Sparks MRN: [id number] Date of Birth: December 23, 1965  Today's Date: 12/19/2014 SLP Individual Time: 1400-1430 SLP Individual Time Calculation (min): 30 min  Short Term Goals: Week 1: SLP Short Term Goal 1 (Week 1): STGs=LTGs due to length of stay  Skilled Therapeutic Interventions: Skilled ST intervention provided with focus on dysphagia goals. Pt cooperative and accepting of regular texture trials. Pt consumed 50% of lunch, stating that his swallowing "feels better" and trach has been placed correctly. Slp trialed regular texture graham crackers with thin liquids. Verbal cue provided x1 to take small sips, pt self-monitored use of swallow strategies for remainder of snack.  No overt s/s of aspiration noted with regular/thin consistencies. Pt noted with appropriate bolus manipulation and able to clear oral cavity with small bites. Pt education provided r/t diet recommendation, PMSV placement during meals and potential for diet upgrade, expressed understanding.    FIM:  Comprehension Comprehension Mode: Auditory Comprehension: 5-Understands complex 90% of the time/Cues < 10% of the time Expression Expression Mode: Verbal Expression Assistive Devices: 6-Talk trach valve Expression: 5-Expresses basic 90% of the time/requires cueing < 10% of the time. Social Interaction Social Interaction: 6-Interacts appropriately with others with medication or extra time (anti-anxiety, antidepressant). Problem Solving Problem Solving: 5-Solves basic 90% of the time/requires cueing < 10% of the time Memory Memory: 5-Recognizes or recalls 90% of the time/requires cueing < 10% of the time FIM - Eating Eating Activity: 5: Supervision/cues  Pain Pain Assessment Pain Assessment: No/denies pain  Therapy/Group: Individual Therapy  [name], Kara PacerLeah N 12/19/2014, 2:36 PM

## 2014-12-19 NOTE — Progress Notes (Signed)
Occupational Therapy Session Note  Patient Details  Name: Andrew Sparks MRN: [id number] Date of Birth: 03/25/1966  Today's Date: 12/19/2014 OT Individual Time: [id number] OT Individual Time Calculation (min): 90 min    Short Term Goals: Week 1:  OT Short Term Goal 1 (Week 1): STGs = LTGs secondary to estimated short length of stay  Skilled Therapeutic Interventions/Progress Updates:  Upon entering the room, pt seated in recliner chair awaiting therapist arrival. Pt requesting to finishing self care task such as grooming, LB dressing, and toileting with supervision for safety. Pt supervised for lunch during this session with min verbal cues required for simple bites, small sips, and to alternate between food and liquids. Pt ambulated 180 feet to ADL apartment with supervision and use of RW without rest break. Once leaving room pt on portable O2 FiO2 28% and O2 dropping to 84% but recovered in less than 1 minute with seated rest and deep breathing. Once in kitchen, pt engaged in simple meal prep activity with supervision. Pt demonstrated ability to transfer items from cabinet <>microwave<>refrigerator<> and sink with supervision and use of RW. OT also discussed management of O2 cord during this time in kitchen and pt able to manage with min verbal guidance cues. OT assisted pt downstairs to gift shop via wheelchair in order to conserve energy. Once in gift shop pt performed 2 bouts of 100' each time by maneuvering in aisles with use of RW and supervision. O2 staying above 90% after each time. OT assisted pt back upstairs via wheelchair where he ambulated the remaining ~100' back to room with RW and supervision. Pt requiring rest breaks after bouts of ambulation secondary to decreased endurance. Pt seated in recliner chair with call bell and all other needed items within reach upon exiting the room.    Therapy Documentation Precautions:  Precautions Precautions: Fall, Other (comment) Precaution  Comments: trach- monitor O2 levels Restrictions Weight Bearing Restrictions: No Pain: Pain Assessment Pain Assessment: No/denies pain  See FIM for current functional status  Therapy/Group: Individual Therapy  [name], Kailash Hinze L 12/19/2014, 2:36 PM

## 2014-12-19 NOTE — Progress Notes (Signed)
ANTICOAGULATION CONSULT NOTE - FOLLOW UP  Pharmacy Consult:  Coumadin Indication: DVT  No Known Allergies  Patient Measurements: Height: 5\' 10"  (177.8 cm) Weight: 292 lb 5.3 oz (132.6 kg) IBW/kg (Calculated) : 73  Vital Signs: Temp: 97.7 F (36.5 C) (01/16 0541) Temp Source: Oral (01/16 0541) BP: 170/98 mmHg (01/16 0548) Pulse Rate: 72 (01/16 1210)  Labs:  Recent Labs  12/17/14 0613 12/18/14 0735 12/19/14 0640  LABPROT 29.3* 32.2* 30.4*  INR 2.75* 3.11* 2.88*  CREATININE  --  0.79  --     Estimated Creatinine Clearance: 154.6 mL/min (by C-G formula based on Cr of 0.79).     Assessment: 48 YOM transferred from [place] to inpatient rehab on 12/15/14 after a complicated hospital course.  He is currently on warfarin for new DVT.  Doses of warfarin prescribed cannot be found in [facility] notes but patient was discharged to rehab on 7.5mg  daily.  INR decreased to therapeutic level today; no overt bleeding reported.   Goal of Therapy:  INR 2-3    Plan:  - Coumadin 3mg  PO today - Daily PT / INR   Richie Bonanno D. Laney Potashang, PharmD, BCPS Pager:  [phone] 12/19/2014, 1:49 PM

## 2014-12-19 NOTE — Progress Notes (Signed)
Pt is still able to verbalize without the PMV and the trach is in same position.  Patient vital signs and O2 sats are within normal limits. RT will continue to monitor.

## 2014-12-20 ENCOUNTER — Inpatient Hospital Stay (HOSPITAL_COMMUNITY): Payer: Self-pay

## 2014-12-20 ENCOUNTER — Inpatient Hospital Stay (HOSPITAL_COMMUNITY): Payer: Medicaid Other

## 2014-12-20 ENCOUNTER — Encounter (HOSPITAL_COMMUNITY): Payer: Self-pay

## 2014-12-20 DIAGNOSIS — Z430 Encounter for attention to tracheostomy: Secondary | ICD-10-CM | POA: Diagnosis present

## 2014-12-20 LAB — PROTIME-INR
INR: 2.42 — AB (ref 0.00–1.49)
PROTHROMBIN TIME: 26.5 s — AB (ref 11.6–15.2)

## 2014-12-20 LAB — GLUCOSE, CAPILLARY
GLUCOSE-CAPILLARY: 96 mg/dL (ref 70–99)
Glucose-Capillary: 97 mg/dL (ref 70–99)
Glucose-Capillary: 90 mg/dL (ref 70–99)
Glucose-Capillary: 113 mg/dL — ABNORMAL HIGH (ref 70–99)

## 2014-12-20 MED ORDER — WARFARIN SODIUM 5 MG PO TABS
5.0000 mg | ORAL_TABLET | Freq: Once | ORAL | Status: AC
  Administered 2014-12-20: 5 mg via ORAL
  Filled 2014-12-20: qty 1

## 2014-12-20 NOTE — Progress Notes (Signed)
   Name: Andrew Sparks L Ashers MRN: [id number] DOB: 01/26/1966    ADMISSION DATE:  12/15/2014 CONSULTATION DATE:  12/18/2014  REFERRING MD :  Rehab  CHIEF COMPLAINT:  Airway compromise  BRIEF PATIENT DESCRIPTION: 49 year old male with PMH of morbid obesity, OSA, OHS, hypersomnia and COPD who continues to smoke.  Was admitted with COPD exacerbation and acute diastolic heart failure.  [facility].  Had difficulty weaning from the ventilator and was trached by ENT on 12/07/14.  Cuffed size 6 shiley was left in the patient and he was transferred to CIR.  In CIR the patient dislodged his tracheostomy and they were unable to replace it.  PCCM was called on consultation to address airway compromise.  SIGNIFICANT EVENTS  1/4 Trach placed. 1/15 Trach dislodged and PCCM replaced. 1/17 trach dislodged again, in a false trach 6 cuffless xlt placed.  SUBJECTIVE: C/o SOB, feels tightness in his neck.  VITAL SIGNS: Temp:  [98.4 F (36.9 C)-98.7 F (37.1 C)] 98.7 F (37.1 C) (01/17 1419) Pulse Rate:  [55-71] 55 (01/17 1520) Resp:  [16-18] 18 (01/17 1520) BP: (159-165)/(91-98) 165/98 mmHg (01/17 1419) SpO2:  [92 %-98 %] 95 % (01/17 1520) FiO2 (%):  [28 %-40 %] 28 % (01/17 1520)  PHYSICAL EXAMINATION: General:  Chronically ill appearing morbidly obese male, trach is through the skin but not into the airway. Neuro:  Alert and interactive.  Follows all commands. HEENT:  [place]/AT, PERRL, EOM-I and MMM. Cardiovascular:  RRR, Nl S1/S2, -M/R/G. Lungs:  Decreased BS at the bases, air movement through the mouth and nose not trach site, coarse BS in all lung field. Abdomen:  Soft, NT, ND and +BS. Musculoskeletal:  -edema and -tenderness. Skin:  Intact.  Stoma open but no direct route visualized into the airway.   Recent Labs Lab 12/16/14 0416 12/18/14 0735  NA 133* 135  K 3.2* 3.6  CL 95* 98  CO2 29 28  BUN 8 5*  CREATININE 0.85 0.79  GLUCOSE 113* 107*     Recent Labs Lab 12/16/14 0416  HGB 11.9*  HCT 36.9*  WBC 6.8  PLT 229   No results found.  ASSESSMENT / PLAN:  49 year old male with extensive pulmonary history as above presenting to PCCM after dislodging his trach.  The trach is 4111 days old and have not been changed prior.  Stoma appears intact however, patient has to be positioned with his head very extended for replacing it.  Plan: - Will replace trach today with a cuffless 6 XLT. - No O2 required. - Will need very aggressive weight loss. - Smoking cessation.  Alyson ReedyWesam G. Daphane Odekirk, M.D. [facility]. Pager: [phone]. After hours pager: [phone].  12/20/2014, 5:44 PM

## 2014-12-20 NOTE — Progress Notes (Signed)
1600 RT called to patient room via RN.  Trach visibly more left of stoma than previous assessment.  Patient o2 sat above 94%.  Patient is verbal, denies pain and  exhibits no visible signs of distress.

## 2014-12-20 NOTE — Progress Notes (Signed)
ANTICOAGULATION CONSULT NOTE - FOLLOW UP  Pharmacy Consult:  Coumadin Indication: DVT  No Known Allergies  Patient Measurements: Height: 5\' 10"  (177.8 cm) Weight: 292 lb 5.3 oz (132.6 kg) IBW/kg (Calculated) : 73  Vital Signs: Temp: 98.4 F (36.9 C) (01/17 0503) Temp Source: Oral (01/17 0503) BP: 159/91 mmHg (01/17 0503) Pulse Rate: 60 (01/17 1130)  Labs:  Recent Labs  12/18/14 0735 12/19/14 0640 12/20/14 0535  LABPROT 32.2* 30.4* 26.5*  INR 3.11* 2.88* 2.42*  CREATININE 0.79  --   --     Estimated Creatinine Clearance: 154.6 mL/min (by C-G formula based on Cr of 0.79).     Assessment: 48 YOM transferred from [place] to inpatient rehab on 12/15/14 after a complicated hospital course.  He is currently on warfarin for new DVT.  Doses of warfarin prescribed cannot be found in [facility] notes but patient was discharged to rehab on 7.5mg  daily.  INR therapeutic and trending down; no overt bleeding reported.   Goal of Therapy:  INR 2-3    Plan:  - Coumadin 5mg  PO today - Daily PT / INR   Ryheem Jay D. Laney Potashang, PharmD, BCPS Pager:  [phone] 12/20/2014, 1:19 PM

## 2014-12-20 NOTE — Progress Notes (Signed)
Trach changed by Dr. Molli KnockYacoub.  #6XLT cuffless placed.  ETCO2 positive and pt is easily suctioned.  NAD noted.  Back-up supplies were placed in room appropriately for #6XLT.

## 2014-12-20 NOTE — Progress Notes (Signed)
Physical Therapy Session Note  Patient Details  Name: Andrew Sparks MRN: [id number] Date of Birth: [birth year]  Today's Date: 12/20/2014 PT Individual Time: 0900-1000 PT Individual Time Calculation (min): 60 min  Session 2 Time: 1600-1610 Time Calculation (min): 10  Short Term Goals: Week 1:  PT Short Term Goal 1 (Week 1): =LTG's due to ELOS  Skilled Therapeutic Interventions/Progress Updates:    Session 1: Pt received seated in recliner, agreeable to participate in therapy. Session focused on functional endurance, standing balance, dynamic gait. Instructed pt in multiple sit<>stands during session w/ overall supervision from various surfaces. Ambulated 150'x2 to/from rehab gym w/ RW and supervision (assist to manage oxygen tubing). Worked on static and dynamic balance in semi-tandem and tandem stances w/ graded support for 30' x2 each. In hallway pt ambulated w/ no AD side stepping, forward, backward, and with narrow BOS to challenge dynamic balance. Pt with intermittent LOB while ambulating w/ narrow BOS requiring MinA to correct. Pt on 30% FiO2 throughout session with sat's remaining >90% with all activity. Pt required rest breaks between each activity due to decreased endurance. Session ended in pt's room, where pt was left seated in recliner w/ all needs within reach.  Session 2: Pt received seated in recliner, agreeable to participate in therapy. Therapist observed that pt's trach appeared to be out of position (trach was at grossly 30 degree angle to L). Pt's sats were >90% and he was in no acute distress. Informed RN who arrived to evaluate and decision made to call respiratory. RT arrived to take over care in order to change out trach. Pt missed 20 minutes of scheduled PT time.    Therapy Documentation Precautions:  Precautions Precautions: Fall, Other (comment) Precaution Comments: trach- monitor O2 levels Restrictions Weight Bearing Restrictions: No Pain:  No/denies  pain  See FIM for current functional status  Therapy/Group: Individual Therapy  Hosie SpangleGodfrey, Effa Yarrow  Hosie SpangleJess Dellia Donnelly, PT, DPT 12/20/2014, 7:34 AM

## 2014-12-20 NOTE — Progress Notes (Signed)
Patient ID: Andrew Sparks, male   DOB: 1966-03-06, 49 y.o.   MRN: [id number]   Patient ID: Andrew Sparks, male   DOB: December 24, 1965, 49 y.o.   MRN: [id number]  12/20/14.  Admit for CIR with  functional deficits secondary to Encephalopathy and respiratory failure.  49 year old morbidly obese male with h/o OSA/OHS, hypersomnia, COPD with ongoing tobacco use, CHF, HTN who was admitted to [facility] on 11/16/14 with metabolic acidosis and decrease in LOC with encephalopathy due to COPD exacerbation with acute diastolic CHF. He declined in ED requiring intubation and was started on IV solumedrol, IV diuretics for fluid overload as well as IV antibiotics for strep PNA.Marland Kitchen He was reintubated and Hospital course complicated by elevated cardiac enzymes due to medical issues, accelerated HTN requiring multiple medications for control, SVT as well as development of right brachial DVT. He was started on IV heparin for DVT and transition to coumadin. He had difficulty weaning off vent and required PEG placement on 12/01/14 as well as trach by ENT on 12/07/14. He was started on tube feeds but developed N/V due to ileus which has resolved.  Remains stable with anticipated d/c in 2 days  Subjective/Complaints: Breathing ok, awakened by oximetry alarm last noc.  Getting acclimated to alarms and sleepy better. Optimistic about anticipated d/c in 2 days  Past Medical History  Diagnosis Date  . Anxiety   . Morbid obesity   . Obesity hypoventilation syndrome   . Hypersomnia with sleep apnea   . Hypertension   . Stroke   . Sleep apnea   . Shortness of breath dyspnea   . Depression    Patient Vitals for the past 24 hrs:  BP Temp Temp src Pulse Resp SpO2  12/20/14 0826 - - - 70 16 92 %  12/20/14 0503 (!) 159/91 mmHg 98.4 F (36.9 C) Oral (!) 59 17 96 %  12/20/14 0335 - - - 71 16 96 %  12/19/14 2313 - - - 64 16 97 %  12/19/14 1615 - - - - - 97 %  12/19/14 1453 (!) 173/80 mmHg 98.2 F (36.8 C) Oral (!) 58 20  94 %  12/19/14 1210 - - - 72 18 -      Intake/Output Summary (Last 24 hours) at 12/20/14 0916 Last data filed at 12/19/14 1800  Gross per 24 hour  Intake    480 ml  Output      0 ml  Net    480 ml    Lab Results  Component Value Date   INR 2.42* 12/20/2014   INR 2.88* 12/19/2014   INR 3.11* 12/18/2014   Lab Results  Component Value Date   HGBA1C 6.3* 12/16/2014    CBG (last 3)   Recent Labs  12/19/14 1639 12/19/14 2050 12/20/14 0641  GLUCAP 109* 105* 97    Review of Systems - Negative except some blood per rectum with BM Objective: Vital Signs: Blood pressure 159/91, pulse 70, temperature 98.4 F (36.9 C), temperature source Oral, resp. rate 16, height 5\' 10"  (1.778 m), weight 132.6 kg (292 lb 5.3 oz), SpO2 92 %. Dg Chest Port 1 View  12/18/2014   CLINICAL DATA:  trach replaced.  Shortness of breath.  EXAM: PORTABLE CHEST - 1 VIEW  COMPARISON:  12/15/2014  FINDINGS: Tracheostomy tube again noted, projecting over the mid trachea at the level of the thoracic inlet. Cardiomegaly. No effusions or focal airspace opacity. No edema. No pneumothorax. No acute bony abnormality.  IMPRESSION:  Tracheostomy tube stable in position.  No pneumothorax.  Stable cardiomegaly.   Electronically Signed   By: Rolm Baptise M.D.   On: 12/18/2014 13:45   Results for orders placed or performed during the hospital encounter of 12/15/14 (from the past 72 hour(s))  Glucose, capillary     Status: Abnormal   Collection Time: 12/17/14 11:46 AM  Result Value Ref Range   Glucose-Capillary 113 (H) 70 - 99 mg/dL  Glucose, capillary     Status: Abnormal   Collection Time: 12/17/14 11:54 AM  Result Value Ref Range   Glucose-Capillary 123 (H) 70 - 99 mg/dL  Glucose, capillary     Status: Abnormal   Collection Time: 12/17/14  4:28 PM  Result Value Ref Range   Glucose-Capillary 113 (H) 70 - 99 mg/dL  Glucose, capillary     Status: Abnormal   Collection Time: 12/17/14  8:36 PM  Result Value Ref Range    Glucose-Capillary 112 (H) 70 - 99 mg/dL  Glucose, capillary     Status: Abnormal   Collection Time: 12/18/14  6:45 AM  Result Value Ref Range   Glucose-Capillary 103 (H) 70 - 99 mg/dL  Protime-INR     Status: Abnormal   Collection Time: 12/18/14  7:35 AM  Result Value Ref Range   Prothrombin Time 32.2 (H) 11.6 - 15.2 seconds   INR 3.11 (H) 0.00 - 3.23  Basic metabolic panel     Status: Abnormal   Collection Time: 12/18/14  7:35 AM  Result Value Ref Range   Sodium 135 135 - 145 mmol/L    Comment: Please note change in reference range.   Potassium 3.6 3.5 - 5.1 mmol/L    Comment: Please note change in reference range.   Chloride 98 96 - 112 mEq/L   CO2 28 19 - 32 mmol/L   Glucose, Bld 107 (H) 70 - 99 mg/dL   BUN 5 (L) 6 - 23 mg/dL   Creatinine, Ser 0.79 0.50 - 1.35 mg/dL   Calcium 9.0 8.4 - 10.5 mg/dL   GFR calc non Af Amer >90 >90 mL/min   GFR calc Af Amer >90 >90 mL/min    Comment: (NOTE) The eGFR has been calculated using the CKD EPI equation. This calculation has not been validated in all clinical situations. eGFR's persistently <90 mL/min signify possible Chronic Kidney Disease.    Anion gap 9 5 - 15  Glucose, capillary     Status: Abnormal   Collection Time: 12/18/14 11:27 AM  Result Value Ref Range   Glucose-Capillary 111 (H) 70 - 99 mg/dL   Comment 1 Notify RN   Glucose, capillary     Status: Abnormal   Collection Time: 12/18/14  4:42 PM  Result Value Ref Range   Glucose-Capillary 108 (H) 70 - 99 mg/dL   Comment 1 Notify RN   Glucose, capillary     Status: Abnormal   Collection Time: 12/18/14  9:00 PM  Result Value Ref Range   Glucose-Capillary 102 (H) 70 - 99 mg/dL   Comment 1 Notify RN   Protime-INR     Status: Abnormal   Collection Time: 12/19/14  6:40 AM  Result Value Ref Range   Prothrombin Time 30.4 (H) 11.6 - 15.2 seconds   INR 2.88 (H) 0.00 - 1.49  Glucose, capillary     Status: None   Collection Time: 12/19/14  6:46 AM  Result Value Ref Range    Glucose-Capillary 96 70 - 99 mg/dL   Comment 1 Notify  RN   Glucose, capillary     Status: None   Collection Time: 12/19/14 11:27 AM  Result Value Ref Range   Glucose-Capillary 99 70 - 99 mg/dL   Comment 1 Notify RN   Glucose, capillary     Status: Abnormal   Collection Time: 12/19/14  4:39 PM  Result Value Ref Range   Glucose-Capillary 109 (H) 70 - 99 mg/dL   Comment 1 Notify RN   Glucose, capillary     Status: Abnormal   Collection Time: 12/19/14  8:50 PM  Result Value Ref Range   Glucose-Capillary 105 (H) 70 - 99 mg/dL  Protime-INR     Status: Abnormal   Collection Time: 12/20/14  5:35 AM  Result Value Ref Range   Prothrombin Time 26.5 (H) 11.6 - 15.2 seconds   INR 2.42 (H) 0.00 - 1.49  Glucose, capillary     Status: None   Collection Time: 12/20/14  6:41 AM  Result Value Ref Range   Glucose-Capillary 97 70 - 99 mg/dL     HEENT: trach site ok #6 Cardio: RRR and no murmur Resp: CTA B/L and unlabored GI: BS positive and NT, ND Extremity:  Pulses positive and Edema BLE pretib and pedal 2+ Skin:   Other stasis dermatitis bilateral pretibial Neuro: Alert/Oriented, Cranial Nerve II-XII normal, Normal Sensory and Abnormal Motor 4/5 bilateral deltoids, biceps, triceps, grip, hip flexors, knee extensor, ankle dorsiflexor and plantar flexor Musc/Skel:  Normal Gen. no acute distress  Mood and affect appropriate    Medical Problem List and Plan: 1. Functional deficits secondary to Encephalopathy past respiratory failure 2. RUE DVT treatment/ Anticoagulation: Pharmaceutical: Coumadin, ? If this is needed given brachial, ? At former PICC site  3. Pain Management: Will change oxycodone to prn as denies pain at this moment.  4. HTN-  EImproved today; will continue to track and titrate BP meds if remains elevated 5. DM- controlled.  Continue Novolog 6. OSA/OHS: Reports chronic SOB as well as non-compliance. Will need to lose weight prior to decannulation. PMSV with supervision and  keep trach open at nights.  7.  Diastolic CHF: Monitor daily weights. Low salt diet. Continue lasix, lisinopril and metoprolol.  8. COPD: Educated on tobacco cessation. Will add nebs qid for now.  LOS (Days) 5 A FACE TO FACE EVALUATION WAS PERFORMED  Nyoka Cowden 12/20/2014, 9:16 AM

## 2014-12-20 NOTE — Procedures (Signed)
Trach Change  Six cuffed shiley in false track, removed, obturator placed and used to dilate track and make it more manageable.  Size six cuffless xlt with obturator in it placed and tied.  Good color change and good air movement through the tracheostomy.  CXR ordered and will follow.  Alyson ReedyWesam G. Rook Maue, M.D. [facility]. Pager: [phone]. After hours pager: [phone]

## 2014-12-20 NOTE — Progress Notes (Signed)
Occupational Therapy Session Note  Patient Details  Name: [name] MRN: [id number] Date of Birth: 1966/11/23  Today's Date: 12/20/2014 OT Individual Time: 1030-1200 OT Individual Time Calculation (min): 90 min    Short Term Goals: Week 1:  OT Short Term Goal 1 (Week 1): STGs = LTGs secondary to estimated short length of stay  Skilled Therapeutic Interventions/Progress Updates:    Pt seen for 1:1 OT session with focus on standing balance, functional mobility, safety awareness, and activity tolerance. Pt received sitting in recliner chair. Pt ambulated in room at supervision level with use of RW to retrieve clothing items. Completed bathing and dressing at sink at supervision-Mod I level with occasional cues for safety. Pt ambulated to bathroom and completed toilet task and transfer at Mod I level. Ambulated ~100 ft to therapy gym at supervision level with use of RW and therapist managing O2. Engaged in dynamic standing balance game of horseshoes while also ambulating and retrieving items from floor. Pt completed at overall supervision level with lost rest breaks between each game due to fatigue. Pt SpO2>95% throughout session. Pt ambulated back to room at supervision level with therapist managing O2. Pt left in recliner with all needs in reach.  Therapy Documentation Precautions:  Precautions Precautions: Fall, Other (comment) Precaution Comments: trach- monitor O2 levels Restrictions Weight Bearing Restrictions: No General:   Vital Signs: Therapy Vitals Pulse Rate: 60 Resp: 18 Patient Position (if appropriate): Standing Oxygen Therapy SpO2: 98 % O2 Device: Tracheostomy Collar O2 Flow Rate (L/min): 10 L/min FiO2 (%): 40 % Pain: No report of pain.  See FIM for current functional status  Therapy/Group: Individual Therapy  Daneil Danerkinson, Ariana Juul N 12/20/2014, 11:58 AM

## 2014-12-20 NOTE — Progress Notes (Signed)
RT called to room.  Pt trach was more left than last trach check.  Phonation is noted without PMV.  CCM called.  Dr. Garald BraverMongul to send someone in house to replace.  Color change was negative on ETCO2 detector.  VS stable.  Pt in NAD.

## 2014-12-21 ENCOUNTER — Encounter (HOSPITAL_COMMUNITY): Payer: Self-pay | Admitting: Occupational Therapy

## 2014-12-21 ENCOUNTER — Inpatient Hospital Stay (HOSPITAL_COMMUNITY): Payer: Medicaid Other | Admitting: Speech Pathology

## 2014-12-21 ENCOUNTER — Inpatient Hospital Stay (HOSPITAL_COMMUNITY): Payer: Medicaid Other

## 2014-12-21 ENCOUNTER — Inpatient Hospital Stay (HOSPITAL_COMMUNITY): Payer: Self-pay | Admitting: Rehabilitation

## 2014-12-21 LAB — PROTIME-INR
INR: 2.49 — ABNORMAL HIGH (ref 0.00–1.49)
Prothrombin Time: 27.2 seconds — ABNORMAL HIGH (ref 11.6–15.2)

## 2014-12-21 LAB — GLUCOSE, CAPILLARY
GLUCOSE-CAPILLARY: 126 mg/dL — AB (ref 70–99)
GLUCOSE-CAPILLARY: 113 mg/dL — AB (ref 70–99)
Glucose-Capillary: 106 mg/dL — ABNORMAL HIGH (ref 70–99)
Glucose-Capillary: 91 mg/dL (ref 70–99)
Glucose-Capillary: 111 mg/dL — ABNORMAL HIGH (ref 70–99)

## 2014-12-21 MED ORDER — WARFARIN SODIUM 5 MG PO TABS
5.0000 mg | ORAL_TABLET | Freq: Once | ORAL | Status: AC
  Administered 2014-12-21: 5 mg via ORAL
  Filled 2014-12-21: qty 1

## 2014-12-21 NOTE — Progress Notes (Signed)
Speech Language Pathology Daily Session Note  Patient Details  Name: Andrew Sparks MRN: [id number] Date of Birth: [birth year]  Today's Date: 12/21/2014 SLP Individual Time: 1315-1401 SLP Individual Time Calculation (min): 46 min  Short Term Goals: Week 1: SLP Short Term Goal 1 (Week 1): STGs=LTGs due to length of stay  Skilled Therapeutic Interventions: Patient with trach replacement 1/17; now with #6 extra-long Shiley trach placed.  Patient has been reporting difficulty with PMSV and swallowing; as a result skilled SLP therapy focused on addressing PMSV tolerance and dysphagia.  Following donning of PMSV patient's vitals remained WFL; however, patient demonstrated increased coughing.  RN called to complete deep suctioning, which resulted in better patient tolerance of PMSV with vitals remaining WFL except for SpO2 dropping to the high 80s x2.  In these instances verbal cues to take deep breaths resulted in recovery.  Patient was also observed with minimal trials of regular textures and thin liquids which resulted in no overt s/s of aspiration or report of difficulty.  Recommend to continue PMSV placement during therapies, with full staff supervision and during PO intake.  Additionally, continue with Dys.3 textures at this time.     FIM:  Comprehension Comprehension Mode: Auditory Comprehension: 5-Understands basic 90% of the time/requires cueing < 10% of the time Expression Expression Mode: Verbal Expression Assistive Devices: 6-Talk trach valve Expression: 5-Expresses basic 90% of the time/requires cueing < 10% of the time. Social Interaction Social Interaction: 4-Interacts appropriately 75 - 89% of the time - Needs redirection for appropriate language or to initiate interaction. Problem Solving Problem Solving: 5-Solves basic 90% of the time/requires cueing < 10% of the time Memory Memory: 3-Recognizes or recalls 50 - 74% of the time/requires cueing 25 - 49% of the time FIM -  Eating Eating Activity: 5: Supervision/cues  Pain Pain Assessment Pain Assessment: No/denies pain  Therapy/Group: Individual Therapy  Andrew Sparks, M.A., Andrew Sparks  [name] 12/21/2014, 4:50 PM

## 2014-12-21 NOTE — Progress Notes (Signed)
49 year old morbidly obese male with h/o OSA/OHS, hypersomnia, COPD with ongoing tobacco use, CHF, HTN who was admitted to [facility] on 11/16/14 with metabolic acidosis and decrease in LOC with encephalopathy due to COPD exacerbation with acute diastolic CHF. He declined in ED requiring intubation and was started on IV solumedrol, IV diuretics for fluid overload as well as IV antibiotics for strep PNA.Marland Kitchen. He self extubated on  He was reintubated and Hospital course complicated by elevated cardiac enzymes due to medical issues, accelerated HTN requiring multiple medications for control, SVT as well as development of right brachial DVT. He was started on IV heparin for DVT and transition to coumadin. He had difficulty weaning off vent and required PEG placement on 12/01/14 as well as trach by ENT on 12/07/14. He was started on tube feeds but developed N/V due to ileus which has resolved Subjective/Complaints: Breathing ok, but states he cannot tolerate PMV, x-ray showed no evidence of dislodgment. Appreciate critical care note   Review of Systems - Negative except some blood per rectum with BM Objective: Vital Signs: Blood pressure 135/81, pulse 54, temperature 98.4 F (36.9 C), temperature source Oral, resp. rate 18, height 5\' 10"  (1.778 m), weight 130.455 kg (287 lb 9.6 oz), SpO2 96 %. Dg Chest 2 View  12/21/2014   CLINICAL DATA:  Shortness of breath, tracheostomy  EXAM: CHEST  2 VIEW  COMPARISON:  12/18/2014  FINDINGS: Tract colostomy tube is unchanged in position. Cardiomegaly. No infiltrate or pulmonary edema. Mild basilar atelectasis. No pneumothorax.  IMPRESSION: Stable tracheostomy tube position. Cardiomegaly. Mild basilar atelectasis. No infiltrate or pulmonary edema.   Electronically Signed   By: Natasha MeadLiviu  Pop M.D.   On: 12/21/2014 10:39   Results for orders placed or performed during the hospital encounter of 12/15/14 (from the past 72 hour(s))  Glucose, capillary     Status: Abnormal   Collection Time: 12/18/14  4:42 PM  Result Value Ref Range   Glucose-Capillary 108 (H) 70 - 99 mg/dL   Comment 1 Notify RN   Glucose, capillary     Status: Abnormal   Collection Time: 12/18/14  9:00 PM  Result Value Ref Range   Glucose-Capillary 102 (H) 70 - 99 mg/dL   Comment 1 Notify RN   Protime-INR     Status: Abnormal   Collection Time: 12/19/14  6:40 AM  Result Value Ref Range   Prothrombin Time 30.4 (H) 11.6 - 15.2 seconds   INR 2.88 (H) 0.00 - 1.49  Glucose, capillary     Status: None   Collection Time: 12/19/14  6:46 AM  Result Value Ref Range   Glucose-Capillary 96 70 - 99 mg/dL   Comment 1 Notify RN   Glucose, capillary     Status: None   Collection Time: 12/19/14 11:27 AM  Result Value Ref Range   Glucose-Capillary 99 70 - 99 mg/dL   Comment 1 Notify RN   Glucose, capillary     Status: Abnormal   Collection Time: 12/19/14  4:39 PM  Result Value Ref Range   Glucose-Capillary 109 (H) 70 - 99 mg/dL   Comment 1 Notify RN   Glucose, capillary     Status: Abnormal   Collection Time: 12/19/14  8:50 PM  Result Value Ref Range   Glucose-Capillary 105 (H) 70 - 99 mg/dL  Protime-INR     Status: Abnormal   Collection Time: 12/20/14  5:35 AM  Result Value Ref Range   Prothrombin Time 26.5 (H) 11.6 - 15.2 seconds  INR 2.42 (H) 0.00 - 1.49  Glucose, capillary     Status: None   Collection Time: 12/20/14  6:41 AM  Result Value Ref Range   Glucose-Capillary 97 70 - 99 mg/dL  Glucose, capillary     Status: None   Collection Time: 12/20/14 12:04 PM  Result Value Ref Range   Glucose-Capillary 90 70 - 99 mg/dL   Comment 1 Notify RN   Glucose, capillary     Status: Abnormal   Collection Time: 12/20/14  4:26 PM  Result Value Ref Range   Glucose-Capillary 113 (H) 70 - 99 mg/dL   Comment 1 Notify RN   Glucose, capillary     Status: None   Collection Time: 12/20/14  8:53 PM  Result Value Ref Range   Glucose-Capillary 96 70 - 99 mg/dL  Protime-INR     Status: Abnormal    Collection Time: 12/21/14  6:22 AM  Result Value Ref Range   Prothrombin Time 27.2 (H) 11.6 - 15.2 seconds   INR 2.49 (H) 0.00 - 1.49  Glucose, capillary     Status: Abnormal   Collection Time: 12/21/14  6:54 AM  Result Value Ref Range   Glucose-Capillary 106 (H) 70 - 99 mg/dL  Glucose, capillary     Status: None   Collection Time: 12/21/14  7:25 AM  Result Value Ref Range   Glucose-Capillary 91 70 - 99 mg/dL  Glucose, capillary     Status: Abnormal   Collection Time: 12/21/14 11:32 AM  Result Value Ref Range   Glucose-Capillary 126 (H) 70 - 99 mg/dL     HEENT: trach site ok #6 Cardio: RRR and no murmur Resp: CTA B/L and unlabored GI: BS positive and NT, ND Extremity:  Pulses positive and Edema BLE pretib and pedal 2+ Skin:   Other stasis dermatitis bilateral pretibial Neuro: Alert/Oriented, Cranial Nerve II-XII normal, Normal Sensory and Abnormal Motor 4/5 bilateral deltoids, biceps, triceps, grip, hip flexors, knee extensor, ankle dorsiflexor and plantar flexor Musc/Skel:  Normal Gen. no acute distress  Mood and affect appropriate   Assessment/Plan: 1. Functional deficits secondary to encephalopathy and deconditioning secondary to respiratory failure which require 3+ hours per day of interdisciplinary therapy in a comprehensive inpatient rehab setting. Physiatrist is providing close team supervision and 24 hour management of active medical problems listed below. Physiatrist and rehab team continue to assess barriers to discharge/monitor patient progress toward functional and medical goals. FIM: FIM - Bathing Bathing Steps Patient Completed: Chest, Right Arm, Left Arm, Abdomen, Front perineal area, Buttocks, Right upper leg, Left upper leg, Right lower leg (including foot), Left lower leg (including foot) Bathing: 5: Supervision: Safety issues/verbal cues  FIM - Upper Body Dressing/Undressing Upper body dressing/undressing steps patient completed: Thread/unthread right sleeve  of pullover shirt/dresss, Thread/unthread left sleeve of pullover shirt/dress, Put head through opening of pull over shirt/dress, Pull shirt over trunk Upper body dressing/undressing: 6: More than reasonable amount of time FIM - Lower Body Dressing/Undressing Lower body dressing/undressing steps patient completed: Thread/unthread right pants leg, Thread/unthread left pants leg, Pull pants up/down, Don/Doff right sock, Don/Doff left sock Lower body dressing/undressing: 5: Supervision: Safety issues/verbal cues  FIM - Toileting Toileting steps completed by patient: Adjust clothing prior to toileting, Performs perineal hygiene, Adjust clothing after toileting Toileting Assistive Devices: Grab bar or rail for support Toileting: 6: More than reasonable amount of time  FIM - Diplomatic Services operational officer Devices: Art gallery manager Transfers: 6-More than reasonable amt of time  FIM - Psychologist, occupational  Bed/Chair Transfer Assistive Devices:  (none, oxygen) Bed/Chair Transfer: 6: Supine > Sit: No assist, 6: Sit > Supine: No assist, 6: Bed > Chair or W/C: No assist, 6: Chair or W/C > Bed: No assist  FIM - Locomotion: Wheelchair Distance:  (pt ambulatory) Locomotion: Wheelchair: 0: Activity did not occur FIM - Locomotion: Ambulation Locomotion: Ambulation Assistive Devices: Other (comment), Walker - Rolling (none, pulling O2 tank and RW) Ambulation/Gait Assistance: 5: Supervision Locomotion: Ambulation: 5: Travels 150 ft or more with supervision/safety issues  Comprehension Comprehension Mode: Auditory Comprehension: 5-Understands basic 90% of the time/requires cueing < 10% of the time  Expression Expression Mode: Verbal Expression Assistive Devices: 6-Talk trach valve Expression: 5-Expresses basic 90% of the time/requires cueing < 10% of the time.  Social Interaction Social Interaction: 6-Interacts appropriately with others with medication or extra time (anti-anxiety,  antidepressant).  Problem Solving Problem Solving: 5-Solves basic 90% of the time/requires cueing < 10% of the time  Memory Memory: 5-Recognizes or recalls 90% of the time/requires cueing < 10% of the time   Medical Problem List and Plan: 1. Functional deficits secondary to Encephalopathy past respiratory failure 2. RUE DVT treatment/ Anticoagulation: Pharmaceutical: Coumadin, ? If this is needed given brachial, ? At former PICC site  3. Pain Management: Will change oxycodone to prn as denies pain at this moment.  4. Anxiety disorder/Mood: Continue celexa daily with xanax bid. LCSW to follow with patient and family for evaluation and support.  5. Neuropsych: This patient is capable of making decisions on her own behalf. 6. Skin/Wound Care: Routine pressure relief measures. Monitor PEG and trach site every shift.  7. Fluids/Electrolytes/Nutrition: Check daily weights. Monitor for signs of overload. Encourage appropriate diet. Add protein supplement to help with edema. reduce H20 Flush as po increases 8. OSA/OHS: Reports chronic SOB as well as non-compliance. Will need to lose weight prior to decannulation. PMSV with supervision and keep trach open at nights.  9. Diastolic CHF: Monitor daily weights. Low salt diet. Continue lasix, lisinopril and metoprolol.  10. COPD: Educated on tobacco cessation. Will add nebs qid for now.  LOS (Days) 6 A FACE TO FACE EVALUATION WAS PERFORMED  Donni Oglesby E 12/21/2014, 12:54 PM

## 2014-12-21 NOTE — Plan of Care (Signed)
Problem: RH Ambulation Goal: LTG Patient will ambulate in controlled environment (PT) LTG: Patient will ambulate in a controlled environment, # of feet with assistance (PT).  Downgraded for O2 management.

## 2014-12-21 NOTE — Progress Notes (Signed)
Physical Therapy Session Note  Patient Details  Name: Andrew Sparks MRN: [id number] Date of Birth: 20-Mar-1966  Today's Date: 12/21/2014 PT Individual Time: 0830-0950 PT Individual Time Calculation (min): 80 min   Short Term Goals: Week 1:  PT Short Term Goal 1 (Week 1): =LTG's due to ELOS  Skilled Therapeutic Interventions/Progress Updates:   Pt received sitting in recliner in room, agreeable to therapy.  Note nursing in room to provide meds and pt with c/o not being able to break very well this morning.  Per notes, his trach was changed to a #6 XL shiley cuffless trach.  He stated that his throat was sore and that it was hard to breath, esp with PMSV on.   SLP notified and states to notify respiratory therapy in order to check to make sure it was okay.  SLP also notified MD who was stating that he would call critical care to assess.  Changed trach collar to appropriate setting of 28%, 3LO2 on portable tank.  Respiratory then in room to re-assess.  She increased supplemental O2 to 8L still at 28% FiO2.  Note pt states he can breath better (this also with PMSV donned).  Applied B ace wraps from mid foot to mid thigh prior to leaving room.  Pt then agreeable to ambulate to therapy gym with RW at S level for O2 management.   Once in therapy gym, provided increased tubing to allow increased independence while in ADL apt.  Pt then ambulated to ADL apt with RW at S level but then ambulated around apt without RW while managing O2 tubing at mod I level.  Performed bed mobility in ADL apt at mod I level and furniture transfer at mod I level.  Pt then able to manage O2 tank without use of RW at S level throughout session.  Performed 13 stairs in therapy gym with B handrails at S level with min cues for breathing throughout.  Ended session with gait to/from ortho gym without RW (managing O2 tank himself) at S to mod I level.  Performed car transfer at S level (will need someone to assist with O2 management).   Note that on way back to room, pt needed to stop and rest.  During rest break, critical care PA to visit with pt.  PT discussed that while on other trach (that was not actually in airway) he was able to manage without SOB and with sats in 90's, however with this new trach, he is obviously SOB and having increased difficulty breathing.  Pt ambulated back towards room and was left in w/c for transport down for chest CT.  RN made aware.  PA stating that he will not D/C tomorrow.    Therapy Documentation Precautions:  Precautions Precautions: Fall, Other (comment) Precaution Comments: trach- monitor O2 levels Restrictions Weight Bearing Restrictions: No   Vital Signs: Therapy Vitals Temp: 98.4 F (36.9 C) Temp Source: Oral Pulse Rate: (!) 54 Resp: 18 BP: 135/81 mmHg Patient Position (if appropriate): Sitting Oxygen Therapy SpO2: 96 % O2 Device: Tracheostomy Collar O2 Flow Rate (L/min): 5 L/min FiO2 (%): 28 % Pain: Pt with mild c/o pain in throat from new trach placement.    Locomotion : Ambulation Ambulation/Gait Assistance: 5: Supervision Wheelchair Mobility Distance:  (pt ambulatory)   See FIM for current functional status  Therapy/Group: Individual Therapy  Vista Deckarcell, Malaia Buchta Ann 12/21/2014, 12:15 PM

## 2014-12-21 NOTE — Progress Notes (Signed)
ANTICOAGULATION CONSULT NOTE - Follow Up Consult  Pharmacy Consult for coumadin Indication: DVT  No Known Allergies  Patient Measurements: Height: 5\' 10"  (177.8 cm) Weight: 287 lb 9.6 oz (130.455 kg) IBW/kg (Calculated) : 73 Heparin Dosing Weight:   Vital Signs: Temp: 98.3 F (36.8 C) (01/18 0450) Temp Source: Oral (01/18 0450) BP: 144/82 mmHg (01/18 0450) Pulse Rate: 67 (01/18 0755)  Labs:  Recent Labs  12/19/14 0640 12/20/14 0535 12/21/14 0622  LABPROT 30.4* 26.5* 27.2*  INR 2.88* 2.42* 2.49*    Estimated Creatinine Clearance: 153.3 mL/min (by C-G formula based on Cr of 0.79).   Medications:  Scheduled:  . citalopram  20 mg Oral Daily  . cloNIDine  0.1 mg Oral TID  . famotidine  20 mg Oral BID  . feeding supplement (GLUCERNA SHAKE)  237 mL Oral BID BM  . free water  200 mL Per Tube 3 times per day  . furosemide  40 mg Oral Daily  . hydrALAZINE  100 mg Oral 3 times per day  . hydrocortisone  25 mg Rectal BID  . insulin aspart  0-5 Units Subcutaneous QHS  . insulin aspart  0-9 Units Subcutaneous TID WC  . lisinopril  40 mg Oral BID  . magnesium oxide  200 mg Oral BID  . metoprolol tartrate  50 mg Oral BID  . nitroGLYCERIN  1 inch Topical BID  . potassium chloride  20 mEq Oral BID  . Warfarin - Pharmacist Dosing Inpatient   Does not apply q1800   Infusions:    Assessment: 49 yo male with DVT is currently on therapeutic coumadin.  INR today is 2.49. Goal of Therapy:  INR 2-3 Monitor platelets by anticoagulation protocol: Yes   Plan:  - Coumadin 5mg  PO today (may need 2.5 to 5 mg daily) - Daily PT / INR  Robin Pafford, Tsz-Yin 12/21/2014,8:26 AM

## 2014-12-21 NOTE — Progress Notes (Signed)
Name: Andrew Sparks MRN: [id number] DOB: June 04, 1966    ADMISSION DATE:  12/15/2014 CONSULTATION DATE:  12/18/2014  REFERRING MD :  Rehab  CHIEF COMPLAINT:  Airway compromise  BRIEF PATIENT DESCRIPTION: 49 year old male with PMH of morbid obesity, OSA, OHS, hypersomnia and COPD who continues to smoke.  Was admitted with COPD exacerbation and acute diastolic heart failure.  [facility] he was [facility]ubated and ultimately trached by ENT on 12/07/14.  Cuffed size 6 shiley was left in the patient and he was transferred to CIR on 1/13.  In CIR the patient dislodged his tracheostomy  1/15 and they were unable to replace it.  PCCM was called on consultation to address airway compromise.  SIGNIFICANT EVENTS  1/04 Trach placed. 1/15 Trach dislodged and PCCM replaced. 1/17 trach dislodged again, in a false trach 6 cuffless xlt placed.  SUBJECTIVE: Feels better since trach change yesterday.  Not tolerating PMV.  C/o orthopnea and DOE.   VITAL SIGNS: Temp:  [98.3 F (36.8 C)-98.7 F (37.1 C)] 98.4 F (36.9 C) (01/18 0900) Pulse Rate:  [54-67] 55 (01/18 0900) Resp:  [14-18] 18 (01/18 0900) BP: (135-165)/(79-99) 135/81 mmHg (01/18 0900) SpO2:  [91 %-98 %] 94 % (01/18 0900) FiO2 (%):  [28 %-40 %] 28 % (01/18 0755) Weight:  [287 lb 9.6 oz (130.455 kg)] 287 lb 9.6 oz (130.455 kg) (01/18 0609)  PHYSICAL EXAMINATION: General:  Chronically ill appearing morbidly obese male, NAD sitting OOB in chair  Neuro:  Alert and interactive.  Follows all commands. HEENT:  [place]/AT, PERRL, EOM-I and MMM, trach c/d, intact Cardiovascular:  RRR, Nl S1/S2, -M/R/G. Lungs:  Decreased BS at the bases, few scattered crackles, good air movement Abdomen:  Soft, NT, ND and +BS. Musculoskeletal:  2+ BLE edema, RUE edema     Recent Labs Lab 12/16/14 0416 12/18/14 0735  NA 133* 135  K 3.2* 3.6  CL 95* 98  CO2 29 28  BUN 8 5*  CREATININE 0.85 0.79  GLUCOSE 113* 107*    Recent  Labs Lab 12/16/14 0416  HGB 11.9*  HCT 36.9*  WBC 6.8  PLT 229   Dg Chest 2 View  12/21/2014   CLINICAL DATA:  Shortness of breath, tracheostomy  EXAM: CHEST  2 VIEW  COMPARISON:  12/18/2014  FINDINGS: Tract colostomy tube is unchanged in position. Cardiomegaly. No infiltrate or pulmonary edema. Mild basilar atelectasis. No pneumothorax.  IMPRESSION: Stable tracheostomy tube position. Cardiomegaly. Mild basilar atelectasis. No infiltrate or pulmonary edema.   Electronically Signed   By: Natasha MeadLiviu  Pop M.D.   On: 12/21/2014 10:39    ASSESSMENT / PLAN:  Tracheostomy status - dislodged x 2, now with #6 cuffless XLT.  Not tolerating PMV.  Dyspnea - improved with XLT trach change  OSA/OHS  COPD  HCAP  CHF  HTN    Plan: - cont PMV efforts with ST  - consider gentle diuresis  - f/u CXR   - O2 as needed  - if continues to have difficulty would have ENT eval  - Will need very aggressive weight loss. - Smoking cessation.   Dirk DressKaty Whiteheart, NP 12/21/2014  10:53 AM Pager: [phone] or [phone]  Reviewed above, examined.    He c/o difficulty breathing with PM valve in place.  Otherwise feels okay.  Lungs clear.  Trach site clean.  Continue trach >> PCCM will f/u intermittently while he is in CIR.  Coralyn HellingVineet Tyson Masin, MD [facility]  12/21/2014, 11:06 AM Pager:  [phone] After 3pm call: [phone]

## 2014-12-21 NOTE — Progress Notes (Signed)
Occupational Therapy Session Note  Patient Details  Name: [name] MRN: [id number] Date of Birth: 03/28/1966  Today's Date: 12/21/2014 OT Individual Time: 1401-1501 OT Individual Time Calculation (min): 60 min    Skilled Therapeutic Interventions/Progress Updates:   Pt performed bathing and dressing sit to stand at the sink this session.  Pt currently on trach collar at 28% O2 8 LPM.  Oxygen sats checked at 96% during session.  Noted pt also with PMSV as well.  Pt with increased coughing at times and PMSV making noise with exhalation after this.  Therapist cleaned off the valve x 3 during session but sound continued to occur after a few minutes.  He gathered clothing prior to session with supervision to manage his oxygen line.  He was able to complete all UB bathing and dressing with supervision and increased time for rest breaks.  He continued to wear his shorts, since he had no other clean ones, and just pushed them down to wash.  Bilateral LEs ace wrapped with gripper socks.  Pt declined washing the LEs today.  Pt transferred back to chair and end of session with PMSV removed.     Therapy Documentation Precautions:  Precautions Precautions: Fall, Other (comment) Precaution Comments: trach- monitor O2 levels Restrictions Weight Bearing Restrictions: No  Vital Signs: Therapy Vitals Temp: 98.2 F (36.8 C) Temp Source: Oral Pulse Rate: (!) 55 Resp: 18 BP: 140/88 mmHg Patient Position (if appropriate): Sitting Oxygen Therapy SpO2: 94 % O2 Device: Tracheostomy Collar O2 Flow Rate (L/min): 5 L/min FiO2 (%): 28 % Pain: Pain Assessment Pain Assessment: No/denies pain ADL: See FIM for current functional status  Therapy/Group: Individual Therapy  Kemi Gell OTR/L 12/21/2014, 4:05 PM

## 2014-12-21 NOTE — Progress Notes (Signed)
Social Work Patient ID: Champ MungoErnesto L Ashers, male   DOB: [birth year], 49 y.o.   MRN: [id number] Spoke with sister to inform probably not being discharged tomorrow and wanted to keep her informed. Asked if could come in tomorrow anyway for teaching, she will see if this is possible. Will let her know if find out any more information.

## 2014-12-22 ENCOUNTER — Inpatient Hospital Stay (HOSPITAL_COMMUNITY): Payer: Self-pay | Admitting: Rehabilitation

## 2014-12-22 ENCOUNTER — Inpatient Hospital Stay (HOSPITAL_COMMUNITY): Payer: Medicaid Other | Admitting: Speech Pathology

## 2014-12-22 ENCOUNTER — Encounter (HOSPITAL_COMMUNITY): Payer: Self-pay | Admitting: Occupational Therapy

## 2014-12-22 LAB — GLUCOSE, CAPILLARY
GLUCOSE-CAPILLARY: 119 mg/dL — AB (ref 70–99)
GLUCOSE-CAPILLARY: 99 mg/dL (ref 70–99)
GLUCOSE-CAPILLARY: 100 mg/dL — AB (ref 70–99)
Glucose-Capillary: 102 mg/dL — ABNORMAL HIGH (ref 70–99)

## 2014-12-22 LAB — PROTIME-INR
INR: 2.69 — ABNORMAL HIGH (ref 0.00–1.49)
Prothrombin Time: 28.8 seconds — ABNORMAL HIGH (ref 11.6–15.2)

## 2014-12-22 MED ORDER — WARFARIN SODIUM 2.5 MG PO TABS
2.5000 mg | ORAL_TABLET | Freq: Once | ORAL | Status: AC
  Administered 2014-12-22: 2.5 mg via ORAL
  Filled 2014-12-22: qty 1

## 2014-12-22 NOTE — Progress Notes (Signed)
Social Work Patient ID: Andrew MungoErnesto L Ashers, male   DOB: 30-Mar-1966, 49 y.o.   MRN: [id number] Pt reports he can get his medicines filled at the [facility] tomorrow.  Will not do match program then. He is pleased with the plan for tomorrow and will see how training goes.

## 2014-12-22 NOTE — Progress Notes (Signed)
49 year old morbidly obese male with h/o OSA/OHS, hypersomnia, COPD with ongoing tobacco use, CHF, HTN who was admitted to [facility] on 11/16/14 with metabolic acidosis and decrease in LOC with encephalopathy due to COPD exacerbation with acute diastolic CHF. He declined in ED requiring intubation and was started on IV solumedrol, IV diuretics for fluid overload as well as IV antibiotics for strep PNA.Marland Kitchen He self extubated on  He was reintubated and Hospital course complicated by elevated cardiac enzymes due to medical issues, accelerated HTN requiring multiple medications for control, SVT as well as development of right brachial DVT. He was started on IV heparin for DVT and transition to coumadin. He had difficulty weaning off vent and required PEG placement on 12/01/14 as well as trach by ENT on 12/07/14. He was started on tube feeds but developed N/V due to ileus which has resolved Subjective/Complaints: Xray shows good placement Pt more calm today Appreciate critical care note   Review of Systems - Negative except some blood per rectum with BM Objective: Vital Signs: Blood pressure 150/90, pulse 87, temperature 98.1 F (36.7 C), temperature source Other (Comment), resp. rate 19, height  (1.778 m), weight 130.727 kg (288 lb 3.2 oz), SpO2 94 %. Dg Chest 2 View  12/21/2014   CLINICAL DATA:  Shortness of breath, tracheostomy  EXAM: CHEST  2 VIEW  COMPARISON:  12/18/2014  FINDINGS: Tract colostomy tube is unchanged in position. Cardiomegaly. No infiltrate or pulmonary edema. Mild basilar atelectasis. No pneumothorax.  IMPRESSION: Stable tracheostomy tube position. Cardiomegaly. Mild basilar atelectasis. No infiltrate or pulmonary edema.   Electronically Signed   By: Natasha Mead M.D.   On: 12/21/2014 10:39   Results for orders placed or performed during the hospital encounter of 12/15/14 (from the past 72 hour(s))  Glucose, capillary     Status: None   Collection Time: 12/19/14 11:27 AM   Result Value Ref Range   Glucose-Capillary 99 70 - 99 mg/dL   Comment 1 Notify RN   Glucose, capillary     Status: Abnormal   Collection Time: 12/19/14  4:39 PM  Result Value Ref Range   Glucose-Capillary 109 (H) 70 - 99 mg/dL   Comment 1 Notify RN   Glucose, capillary     Status: Abnormal   Collection Time: 12/19/14  8:50 PM  Result Value Ref Range   Glucose-Capillary 105 (H) 70 - 99 mg/dL  Protime-INR     Status: Abnormal   Collection Time: 12/20/14  5:35 AM  Result Value Ref Range   Prothrombin Time 26.5 (H) 11.6 - 15.2 seconds   INR 2.42 (H) 0.00 - 1.49  Glucose, capillary     Status: None   Collection Time: 12/20/14  6:41 AM  Result Value Ref Range   Glucose-Capillary 97 70 - 99 mg/dL  Glucose, capillary     Status: None   Collection Time: 12/20/14 12:04 PM  Result Value Ref Range   Glucose-Capillary 90 70 - 99 mg/dL   Comment 1 Notify RN   Glucose, capillary     Status: Abnormal   Collection Time: 12/20/14  4:26 PM  Result Value Ref Range   Glucose-Capillary 113 (H) 70 - 99 mg/dL   Comment 1 Notify RN   Glucose, capillary     Status: None   Collection Time: 12/20/14  8:53 PM  Result Value Ref Range   Glucose-Capillary 96 70 - 99 mg/dL  Protime-INR     Status: Abnormal   Collection Time: 12/21/14  6:22  AM  Result Value Ref Range   Prothrombin Time 27.2 (H) 11.6 - 15.2 seconds   INR 2.49 (H) 0.00 - 1.49  Glucose, capillary     Status: Abnormal   Collection Time: 12/21/14  6:54 AM  Result Value Ref Range   Glucose-Capillary 106 (H) 70 - 99 mg/dL  Glucose, capillary     Status: None   Collection Time: 12/21/14  7:25 AM  Result Value Ref Range   Glucose-Capillary 91 70 - 99 mg/dL  Glucose, capillary     Status: Abnormal   Collection Time: 12/21/14 11:32 AM  Result Value Ref Range   Glucose-Capillary 126 (H) 70 - 99 mg/dL  Glucose, capillary     Status: Abnormal   Collection Time: 12/21/14  4:38 PM  Result Value Ref Range   Glucose-Capillary 111 (H) 70 - 99  mg/dL  Glucose, capillary     Status: Abnormal   Collection Time: 12/21/14  8:51 PM  Result Value Ref Range   Glucose-Capillary 113 (H) 70 - 99 mg/dL  Protime-INR     Status: Abnormal   Collection Time: 12/22/14  6:26 AM  Result Value Ref Range   Prothrombin Time 28.8 (H) 11.6 - 15.2 seconds   INR 2.69 (H) 0.00 - 1.49  Glucose, capillary     Status: Abnormal   Collection Time: 12/22/14  6:30 AM  Result Value Ref Range   Glucose-Capillary 102 (H) 70 - 99 mg/dL     HEENT: trach site ok #6 Cardio: RRR and no murmur Resp: CTA B/L and unlabored GI: BS positive and NT, ND Extremity:  Pulses positive and Edema BLE pretib and pedal 2+ Skin:   Other stasis dermatitis bilateral pretibial Neuro: Alert/Oriented, Cranial Nerve II-XII normal, Normal Sensory and Abnormal Motor 4/5 bilateral deltoids, biceps, triceps, grip, hip flexors, knee extensor, ankle dorsiflexor and plantar flexor Musc/Skel:  Normal Gen. no acute distress  Mood and affect appropriate   Assessment/Plan: 1. Functional deficits secondary to encephalopathy and deconditioning secondary to respiratory failure which require 3+ hours per day of interdisciplinary therapy in a comprehensive inpatient rehab setting. Physiatrist is providing close team supervision and 24 hour management of active medical problems listed below. Physiatrist and rehab team continue to assess barriers to discharge/monitor patient progress toward functional and medical goals. Ready for D/C in am, medically stable , meeting PT/OT goals FIM: FIM - Bathing Bathing Steps Patient Completed: Chest, Right Arm, Left Arm, Abdomen, Front perineal area, Buttocks, Right upper leg, Left upper leg Bathing: 5: Supervision: Safety issues/verbal cues  FIM - Upper Body Dressing/Undressing Upper body dressing/undressing steps patient completed: Thread/unthread right sleeve of pullover shirt/dresss, Thread/unthread left sleeve of pullover shirt/dress, Put head through  opening of pull over shirt/dress, Pull shirt over trunk Upper body dressing/undressing: 6: More than reasonable amount of time FIM - Lower Body Dressing/Undressing Lower body dressing/undressing steps patient completed:  (Pt only needed to push his pants down and wash so no LB clothing applied) Lower body dressing/undressing: 5: Supervision: Safety issues/verbal cues  FIM - Toileting Toileting steps completed by patient: Adjust clothing prior to toileting, Performs perineal hygiene, Adjust clothing after toileting Toileting Assistive Devices: Grab bar or rail for support Toileting: 6: More than reasonable amount of time  FIM - Diplomatic Services operational officer Devices: Art gallery manager Transfers: 6-More than reasonable amt of time  FIM - Banker Devices:  (none, oxygen) Bed/Chair Transfer: 6: Supine > Sit: No assist, 6: Sit > Supine: No assist, 6: Bed >  Chair or W/C: No assist, 6: Chair or W/C > Bed: No assist  FIM - Locomotion: Wheelchair Distance:  (pt ambulatory) Locomotion: Wheelchair: 0: Activity did not occur FIM - Locomotion: Ambulation Locomotion: Ambulation Assistive Devices: Other (comment), Walker - Rolling (none, pulling O2 tank and RW) Ambulation/Gait Assistance: 5: Supervision Locomotion: Ambulation: 5: Travels 150 ft or more with supervision/safety issues  Comprehension Comprehension Mode: Auditory Comprehension: 5-Understands basic 90% of the time/requires cueing < 10% of the time  Expression Expression Mode: Verbal Expression Assistive Devices: 6-Talk trach valve Expression: 5-Expresses basic 90% of the time/requires cueing < 10% of the time.  Social Interaction Social Interaction: 6-Interacts appropriately with others with medication or extra time (anti-anxiety, antidepressant).  Problem Solving Problem Solving: 5-Solves basic 90% of the time/requires cueing < 10% of the time  Memory Memory: 5-Recognizes or  recalls 90% of the time/requires cueing < 10% of the time   Medical Problem List and Plan: 1. Functional deficits secondary to Encephalopathy past respiratory failure 2. RUE DVT treatment/ Anticoagulation: Pharmaceutical: Coumadin, ? If this is needed given brachial, ? At former PICC site  3. Pain Management: Will change oxycodone to prn as denies pain at this moment.  4. Anxiety disorder/Mood: Continue celexa daily with xanax bid. LCSW to follow with patient and family for evaluation and support.  5. Neuropsych: This patient is capable of making decisions on her own behalf. 6. Skin/Wound Care: Routine pressure relief measures. Monitor PEG and trach site every shift.  7. Fluids/Electrolytes/Nutrition: Check daily weights. Monitor for signs of overload. Encourage appropriate diet. Add protein supplement to help with edema. reduce H20 Flush as po increases 8. OSA/OHS: Reports chronic SOB as well as non-compliance. Will need to lose weight prior to decannulation. PMSV with supervision and keep trach open at nights.  9. Diastolic CHF: Monitor daily weights. Low salt diet. Continue lasix, lisinopril and metoprolol.  10. COPD: Educated on tobacco cessation. Will add nebs qid for now.  LOS (Days) 7 A FACE TO FACE EVALUATION WAS PERFORMED  KIRSTEINS,ANDREW E 12/22/2014, 8:38 AM

## 2014-12-22 NOTE — Progress Notes (Signed)
Physical Therapy Discharge Summary  Patient Details  Name: Andrew Sparks MRN: [id number] Date of Birth: 1966-05-16  Today's Date: 12/22/2014 PT Individual Time: 0930-1030 PT Individual Time Calculation (min): 60 min    Patient has met 10 of 10 long term goals due to improved activity tolerance, improved balance, improved postural control, increased strength, ability to compensate for deficits and improved awareness.  Patient to discharge at an ambulatory level Modified Independent.   Patient's care partner unavailable (did not have formal family training for PT) to provide the necessary intermittent assistance at discharge.  Reasons goals not met: n/a   Recommendation:  Patient will benefit from ongoing skilled PT services in home health setting to continue to advance safe functional mobility, address ongoing impairments in decreased balance, generalized weakness, decreased endurance, and minimize fall risk.  Equipment: RW  Reasons for discharge: treatment goals met and discharge from hospital  Patient/family agrees with progress made and goals achieved: Yes   PT Treatment/Intervention:   Pt received sitting in recliner in room, agreeable to therapy session.  Note that PT attempting to figure out how to make pt Mod I in room with O2 tubing.  Respiratory therapy called and this PT spoke with them and notified of plans to make Mod I in room.  She is to provide increased blue tubing in order for pt to move about in room.  Skilled session focused on grad day activities and ensuring he has met all goals.  Performed ambulation throughout session in controlled and home environment x 200' at mod I level while managing his O2 tank and tubing.  Performed BERG balance test with new score of 53/56 significantly reducing fall risk.  Performed bed mobility, furniture transfers, floor transfers at mod I level.  Performed car transfer at S level (will need assist for O2 tank into/out of car.  Ended  session with stair negotiation x 12, 6" steps with B handrails (did first and last step without rail to meet home entry goal) in step to fashion at S level.  NOTE:  PTS SATS DROPPED TO 87% ON 28% FiO2 DURING STAIR NEGOTIATION.   RN made aware and pt able to recover to 90's with seated rest break.  Provided education on performing a few stairs, then standing for rest break and then continuing if he does need to perform that amount of stairs in future.  Pt verbalized understanding.   Pt ambulated back to room and left in room to use restroom to obtain Mod I sign.  Left in recliner and awaiting tubing for pt to increase mobility in room.  RN aware.    PT Discharge Precautions/Restrictions Precautions Precautions: Fall;Other (comment) Precaution Comments: trach, monitor O2 Restrictions Weight Bearing Restrictions: No  Pain Pain Assessment Pain Assessment: No/denies pain Vision/Perception   See OT note Cognition Overall Cognitive Status: History of cognitive impairments - at baseline Arousal/Alertness: Awake/alert Orientation Level: Oriented X4 Attention: Selective Selective Attention: Appears intact Memory: Impaired Memory Impairment: Decreased recall of new information Decreased Short Term Memory: Functional complex;Verbal complex Awareness: Appears intact Safety/Judgment: Appears intact Comments: Pt now Mod I in room  Sensation Sensation Light Touch: Appears Intact Stereognosis: Not tested Hot/Cold: Not tested Proprioception: Appears Intact Coordination Gross Motor Movements are Fluid and Coordinated: Yes Fine Motor Movements are Fluid and Coordinated: Yes Heel Shin Test: grossly [facility] Motor  Motor Motor: Other (comment) Motor - Discharge Observations: Pt with decreased high level balance and decreased endurance.   Mobility Bed Mobility Bed Mobility: Supine to  Sit;Sit to Supine Supine to Sit: 6: Modified independent (Device/Increase time) Sit to Supine: 6: Modified  independent (Device/Increase time) Transfers Transfers: Yes Sit to Stand: 6: Modified independent (Device/Increase time) Stand to Sit: 6: Modified independent (Device/Increase time) Stand Pivot Transfers: 6: Modified independent (Device/Increase time) Locomotion  Ambulation Ambulation: Yes Ambulation/Gait Assistance: 6: Modified independent (Device/Increase time) Ambulation Distance (Feet): 200 Feet Assistive device: None;Other (Comment) (pt able to manage O2 tank) Gait Gait: Yes Gait Pattern: Impaired Gait Pattern: Step-through pattern;Decreased stride length;Trunk flexed;Shuffle;Wide base of support Stairs / Additional Locomotion Stairs: Yes Stairs Assistance: 5: Supervision Stairs Assistance Details: Verbal cues for gait pattern;Verbal cues for safe use of DME/AE;Verbal cues for precautions/safety Stair Management Technique: No rails;Two rails (did single step without rail to simulate home entry, otherwise 2 rails) Number of Stairs: 12 Height of Stairs: 6 Wheelchair Mobility Wheelchair Mobility: No (pt ambulatory)  Trunk/Postural Assessment  Cervical Assessment Cervical Assessment: Exceptions to [facility] Cervical Strength Overall Cervical Strength Comments: forward head Thoracic Assessment Thoracic Assessment: Exceptions to [facility] Thoracic Strength Overall Thoracic Strength Comments: rounded shoulders Lumbar Strength Overall Lumbar Strength Comments: posterior pelvic tilt Postural Control Protective Responses: improved hip and stepping strategy as compared to eval Postural Limitations: continues to have forward flexed posture during most static and dynamic tasks.   Balance Balance Balance Assessed: Yes Standardized Balance Assessment Standardized Balance Assessment: Berg Balance Test Berg Balance Test Sit to Stand: Able to stand without using hands and stabilize independently Standing Unsupported: Able to stand safely 2 minutes Sitting with Back Unsupported but Feet  Supported on Floor or Stool: Able to sit safely and securely 2 minutes Stand to Sit: Sits safely with minimal use of hands Transfers: Able to transfer safely, minor use of hands Standing Unsupported with Eyes Closed: Able to stand 10 seconds safely Standing Ubsupported with Feet Together: Able to place feet together independently and stand 1 minute safely From Standing, Reach Forward with Outstretched Arm: Can reach confidently >25 cm (10") From Standing Position, Pick up Object from Floor: Able to pick up shoe safely and easily From Standing Position, Turn to Look Behind Over each Shoulder: Looks behind from both sides and weight shifts well Turn 360 Degrees: Able to turn 360 degrees safely but slowly Standing Unsupported, Alternately Place Feet on Step/Stool: Able to stand independently and safely and complete 8 steps in 20 seconds Standing Unsupported, One Foot in Front: Able to place foot tandem independently and hold 30 seconds Standing on One Leg: Able to lift leg independently and hold 5-10 seconds Total Score: 53 Static Sitting Balance Static Sitting - Balance Support: No upper extremity supported;Feet supported Static Sitting - Level of Assistance: 6: Modified independent (Device/Increase time) Dynamic Sitting Balance Dynamic Sitting - Balance Support: Feet supported;No upper extremity supported Dynamic Sitting - Level of Assistance: 6: Modified independent (Device/Increase time) Static Standing Balance Static Standing - Balance Support: No upper extremity supported Static Standing - Level of Assistance: 6: Modified independent (Device/Increase time) Dynamic Standing Balance Dynamic Standing - Balance Support: During functional activity;No upper extremity supported Dynamic Standing - Level of Assistance: 6: Modified independent (Device/Increase time) Extremity Assessment      RLE Assessment RLE Assessment: Within Functional Limits (grossly 3+/5) LLE Assessment LLE Assessment:  Within Functional Limits  See FIM for current functional status  Denice Bors 12/22/2014, 11:58 AM

## 2014-12-22 NOTE — Progress Notes (Signed)
Social Work Patient ID: Andrew MungoErnesto L Ashers, male   DOB: May 31, 1966, 49 y.o.   MRN: [id number] Spoke with sister via telephone to schedule family education for tomorrow at 9;30 she is concerned if weather bad she will not come.  She states: ' I am scared to death of the snow." Will plan for education tomorrow and will order DME and trach supplies.  Pt is aware of the plans and hopes today is better than yesterday.

## 2014-12-22 NOTE — Discharge Summary (Signed)
Physician Discharge Summary  Patient ID: Andrew Sparks MRN: [id number] DOB/AGE: [birth year] 49 y.o.  Admit date: 12/15/2014 Discharge date: 12/23/2014  Discharge Diagnoses:  Principal Problem:   Encephalopathy pulmonary Active Problems:   Status post tracheostomy   Status post gastrostomy   Debility   Obesity hypoventilation syndrome   Tracheostomy care   Discharged Condition: Stable   Significant Diagnostic Studies: Dg Chest 2 View  12/21/2014   CLINICAL DATA:  Shortness of breath, tracheostomy  EXAM: CHEST  2 VIEW  COMPARISON:  12/18/2014  FINDINGS: Tract colostomy tube is unchanged in position. Cardiomegaly. No infiltrate or pulmonary edema. Mild basilar atelectasis. No pneumothorax.  IMPRESSION: Stable tracheostomy tube position. Cardiomegaly. Mild basilar atelectasis. No infiltrate or pulmonary edema.   Electronically Signed   By: Natasha Mead M.D.   On: 12/21/2014 10:39   Dg Chest 2 View  12/15/2014   CLINICAL DATA:  Followup congestive heart failure  EXAM: CHEST  2 VIEW  COMPARISON:  None.  FINDINGS: Cardiomegaly is noted. The study is limited by patient's large body habitus and poor inspiration. There is streaky bilateral basilar atelectasis or infiltrate. Tracheostomy tube in place. No convincing pulmonary edema.  IMPRESSION: Tracheostomy tube in place. Cardiomegaly. Streaky bilateral basilar atelectasis or infiltrate. No convincing pulmonary edema.   Electronically Signed   By: Natasha Mead M.D.   On: 12/15/2014 19:34   Dg Chest Port 1 View  12/18/2014   CLINICAL DATA:  trach replaced.  Shortness of breath.  EXAM: PORTABLE CHEST - 1 VIEW  COMPARISON:  12/15/2014  FINDINGS: Tracheostomy tube again noted, projecting over the mid trachea at the level of the thoracic inlet. Cardiomegaly. No effusions or focal airspace opacity. No edema. No pneumothorax. No acute bony abnormality.  IMPRESSION: Tracheostomy tube stable in position.  No pneumothorax.  Stable cardiomegaly.   Electronically  Signed   By: Charlett Nose M.D.   On: 12/18/2014 13:45    Labs:  Basic Metabolic Panel:  Recent Labs Lab 12/18/14 0735  NA 135  K 3.6  CL 98  CO2 28  GLUCOSE 107*  BUN 5*  CREATININE 0.79  CALCIUM 9.0    CBC: No results for input(s): WBC, NEUTROABS, HGB, HCT, MCV, PLT in the last 168 hours.  CBG:  Recent Labs Lab 12/22/14 1227 12/22/14 1626 12/22/14 2052 12/23/14 0655 12/23/14 1112  GLUCAP 119* 99 100* 97 109*     Brief HPI:   [name] is a 49 year old morbidly obese male with h/o OSA/OHS, hypersomnolence, COPD with ongoing tobacco use, CHF, HTN, medical non-compliance; who was admitted to [facility] on 11/16/14 with metabolic acidosis and decrease in LOC with encephalopathy due to COPD exacerbation with acute diastolic CHF. He declined in ED requiring intubation. He was treated with IV antibiotics for strep PNA as well as diuretics for fluid overload.  He had difficulty with vent wean required PEG on 12/29 as well as trach by ENT on 12/07/14. He was started on coumadin due to developing right brachial DVT.  His trach was downsized to #6 and ENT recommends continuing this till weight loss due to his non-compliance with CPAP. Therapy initiated and patient was  making excellent progress but noted to be deconditioned. CIR was recommended to maximize functional return as well as family education on trach care.     Hospital Course: Andrew Sparks was admitted to rehab 12/15/2014 for inpatient therapies to consist of PT, ST and OT at least three hours five days a week. Past  admission physiatrist, therapy team and rehab RN have worked together to provide customized collaborative inpatient rehab. His trach dislodged with inability to replace therefore CCM was contacted to assist with this. He was changed out to cuffless 6 Shiley but this dislodged again. Respiratory therapy recommended extra long trach due to short neck and they felt that his was very positional. He was found to  have a false tract on re-evaluation by Dr. Molli Knock on 12/20/14. [name] was dilated and he was fitted with cuffless XLT #6 with good color change. Patient continued to complain of discomfort in part due to anxiety.  Follow up CXR on 01/18 showed stable trach and no dyspnea or SOB noted. Patient and family have been educated on PMSV use during the day (especially with meals) as well as trach change and suctioning as needed.   [facility] was contacted for input on anticoagulation and recommended 3 months of treatment for right brachial DVT. Patient did have some BRBPR due to constipation and this has resolved with use of Anusol HC suppositorys. He was advised to monitor for now and contact MD for any recurrent bleeding episodes He was noted to be hypokalemic at admission and kdur was increased to bid. Follow up labs shows that hypokalemia hs resolved.  Patient has been educated on medication compliance as well as tobacco cessation. He was maintained on dysphagia 3 diet with speech therapy following to monitor tolerance. He has been able to tolerate diet without s/s of aspiration and he was advanced to regular textures. He has required 3 liters oxygen with activity and at nights to maintain adequate oxygenation. He is to keep trach open at nights. He has been set [facility] for [facility]in draw as well as medical follow up. Follow up has been set with [facility] for Feb 3rd. He was set with [facility] for a month supply of current medications. He has made good gains during his rehab stay and is modified independent at discharge. His BERG score has improved to 53/56.  [facility] to provide [facility] for continued education and monitoring.    Rehab course: During patient's stay in rehab weekly team conferences were held to monitor patient's progress, set goals and discuss barriers to discharge. Patient has had improvement in activity tolerance, balance, postural control, as well as  ability to compensate for deficits. He is able to complete bathing and dressing tasks independently and is able to manage his oxygen tubing without difficulty. He is independent for transfers and is able to ambulate 200 feet at modified independent. He requires supervision to navigate 12 stairs.  Family education was done with sister and niece regarding all aspects of care needed past discharge.     Disposition:  Home  Diet: Carb Modified. Low salt.   Special Instructions: 1. Take medications as prescribed.  2. Use oxygen with activity and at bedtime or when sleeping    Medication List    STOP taking these medications        feeding supplement (JEVITY 1.5 CAL) Liqd     nitroGLYCERIN 2 % ointment  Commonly known as:  NITROGLYN  Replaced by:  nitroGLYCERIN 0.2 mg/hr patch     oxyCODONE 5 MG immediate release tablet  Commonly known as:  Oxy IR/ROXICODONE     scopolamine 1 MG/3DAYS  Commonly known as:  TRANSDERM-SCOP     sterile water for irrigation  Replaced by:  free water Soln      TAKE  these medications        acetaminophen 325 MG tablet  Commonly known as:  TYLENOL  Take 650 mg by mouth every 4 (four) hours as needed (pain/fever).     ALPRAZolam 0.25 MG tablet  Commonly known as:  XANAX  Take 1 tablet (0.25 mg total) by mouth 2 (two) times daily.     citalopram 20 MG tablet  Commonly known as:  CELEXA  Take 1 tablet (20 mg total) by mouth daily.     cloNIDine 0.1 MG tablet  Commonly known as:  CATAPRES  Take 1 tablet (0.1 mg total) by mouth 3 (three) times daily.     famotidine 20 MG tablet  Commonly known as:  PEPCID  Take 1 tablet (20 mg total) by mouth 2 (two) times daily.     free water Soln  Place 100 mLs into feeding tube every 12 (twelve) hours. Use filtered or bottled water     furosemide 40 MG tablet  Commonly known as:  LASIX  Take 1 tablet (40 mg total) by mouth daily.     hydrALAZINE 100 MG tablet  Commonly known as:  APRESOLINE  Take 1  tablet (100 mg total) by mouth 3 (three) times daily.     hydrocortisone 25 MG suppository  Commonly known as:  ANUSOL-HC  Place 1 suppository (25 mg total) rectally 2 (two) times daily. For hemorroids     lisinopril 40 MG tablet  Commonly known as:  PRINIVIL,ZESTRIL  Take 1 tablet (40 mg total) by mouth 2 (two) times daily.     magnesium oxide 400 (241.3 MG) MG tablet  Commonly known as:  MAG-OX  Take 0.5 tablets (200 mg total) by mouth 2 (two) times daily.     metoprolol 50 MG tablet  Commonly known as:  LOPRESSOR  Take 1 tablet (50 mg total) by mouth 3 (three) times daily.     nitroGLYCERIN 0.2 mg/hr patch  Commonly known as:  NITRO-DUR  Place 1 patch (0.2 mg total) onto the skin daily.     potassium chloride SA 20 MEQ tablet  Commonly known as:  K-DUR,KLOR-CON  Take 1 tablet (20 mEq total) by mouth 2 (two) times daily.     warfarin 5 MG tablet  Commonly known as:  COUMADIN  Take a whole pill on Mon, Wed, Friday, Sat, Sun with supper. Take 1/2 a pill Tues and Thurs with supper       Follow-up Information    Follow up with Erick Colace, MD.   Specialty:  Physical Medicine and Rehabilitation   Why:  As needed   Contact information:   [address] [place] [address] [phone]       Follow up with Cammy Copa, MD On 12/30/2014.   Specialty:  Otolaryngology   Why:  Be there at  3:45 pm for appointment at 4: 15 pm.  Will  need to take $70.00 and photo ID for office visit.    Contact information:   [address] [place] [address] [phone]       Follow up with [facility] On 12/30/2014.   Why:  APPT 10:45  PM   Contact information:   [address] [place] [address] Tel-[phone] Fax: [phone]      Follow up with Dr. Elesa Massed at [facility]. Go on 01/06/2015.   Why:  Appointment at 10:20 am   Contact information:   [address] [place], [place]  Tel: [phone]      Signed: Jacquelynn CreeLove,  Pamela S 12/24/2014, 2:43 PM

## 2014-12-22 NOTE — Plan of Care (Signed)
Problem: RH Simple Meal Prep Goal: LTG Patient will perform simple meal prep w/assist (OT) LTG: Patient will perform simple meal prep with assistance, with/without cues (OT).  Outcome: Not Met (add Reason) Did not address goal

## 2014-12-22 NOTE — Progress Notes (Signed)
Speech Language Pathology Daily Session Note  Patient Details  Name: Andrew Sparks MRN: [id number] Date of Birth: Oct 09, 1966  Today's Date: 12/22/2014 SLP Individual Time: 0830-0930 SLP Individual Time Calculation (min): 60 min  Short Term Goals: Week 1: SLP Short Term Goal 1 (Week 1): STGs=LTGs due to length of stay  Skilled Therapeutic Interventions: Skilled treatment session focused on addressing PMSV and dysphagia goals and education.  Patient and staff reporting intermittent honking sound from PMSV, [facility]; however, was replaced due to witnessed honking during session.  Patient reported #6 extra-long Shiley trach felt more comfortable today and had it placed upon SLP entering room.  Patient tolerated for 60 minutes with SLP with vitals remaining WFL.  Patient was also observed to consume breakfast of Dys.3 textures and thin liquids via cup and straw with no overt s/s of aspiration.  Patient concerned with inability to orally expectorate secretions and SLP informed that removing PMSV to cough or coughing PMSV off is ok and to replace once secretions are cleared from trach hub.  Patient asked other appropriate questions regarding PMSV and home management, which SLP reinforced with teach back opportunities.  Patient able to demonstrate ability to don and doff with mirror and increased time.  Recommend PMSV placement during all waking hours, PO intake with intermittent staff supervision and diet advancement to regular textures and thin liquids.  Plan for wrap up of patient and family education tomorrow.       FIM:  Comprehension Comprehension Mode: Auditory Comprehension: 5-Understands basic 90% of the time/requires cueing < 10% of the time Expression Expression Mode: Verbal Expression Assistive Devices: 6-Talk trach valve Expression: 5-Expresses basic needs/ideas: With no assist Social Interaction Social Interaction: 6-Interacts appropriately with others with  medication or extra time (anti-anxiety, antidepressant). Problem Solving Problem Solving: 5-Solves basic 90% of the time/requires cueing < 10% of the time Memory Memory: 5-Recognizes or recalls 90% of the time/requires cueing < 10% of the time FIM - Eating Eating Activity: 6: More than reasonable amount of time  Pain Pain Assessment Pain Assessment: No/denies pain  Therapy/Group: Individual Therapy  Andrew FerrettiMelissa Saathvik Sparks, M.A., Andrew Sparks [phone]  Andrew Sparks 12/22/2014, 12:06 PM

## 2014-12-22 NOTE — Progress Notes (Signed)
Occupational Therapy Discharge Summary  Patient Details  Name: Andrew Sparks MRN: [id number] Date of Birth: May 03, 1966  Today's Date: 12/22/2014 OT Individual Time: 1401-1501 OT Individual Time Calculation (min): 60 min   Session Note:  Began session by monitoring O2 sats without supplemental oxygen.  His sats were at 92% on room air at rest.  They decreased to 85% on room air with ambulation but increased to  91-93% on 3Ls trach collar at 28%.  He was able to perform all aspects of bathing and dressing at the sink with modified independence.  He was also able to manage his O2 line without difficulty as well, while gathering clothing.  He was able to also reach down to his feet for bathing and for donning gripper socks without use of any AE this session as well.  Pt instructed to use the right hand to check for something being hot and not to use his left hand as he reported some numbness in the digits of his left hand.     Patient has met 7 of 8 long term goals due to improved activity tolerance, improved balance, postural control, ability to compensate for deficits, improved attention and improved awareness.  Patient to discharge at overall Modified Independent level.  Patient's care partner is independent to provide the necessary cognitive assistance at discharge.    Reasons goals not met: Pt did not attempt simple meal prep on this admission so goal not met.  Recommendation:  Pt is currently modified independent level for all selfcare tasks and functional transfers.  Feel he will benefit from continued OT to help increase overall endurance for selfcare tasks as well as continuing to progress back to independent level.  He will be staying with his sister post discharge.   Equipment: No equipment provided  Reasons for discharge: treatment goals met and discharge from hospital  Patient/family agrees with progress made and goals achieved: Yes  OT Discharge Precautions/Restrictions   Precautions Precautions: Other (comment) Precaution Comments: trach, monitor O2 Restrictions Weight Bearing Restrictions: No  Pain Pain Assessment Pain Assessment: No/denies pain ADL  See FIM scale for details  Vision/Perception  Vision- History Baseline Vision/History: Wears glasses Wears Glasses: Reading only Patient Visual Report: No change from baseline Vision- Assessment Vision Assessment?: No apparent visual deficits  Cognition Overall Cognitive Status: History of cognitive impairments - at baseline Arousal/Alertness: Awake/alert Orientation Level: Oriented X4 Attention: Selective Selective Attention: Appears intact Memory: Impaired Memory Impairment: Decreased recall of new information;Decreased short term memory Decreased Short Term Memory: Functional complex;Verbal complex Awareness: Appears intact Problem Solving: Appears intact (for simple selfcare tasks) Safety/Judgment: Appears intact Sensation Sensation Light Touch: Impaired Detail Light Touch Impaired Details: Impaired LUE (Pt reports slight numbness in his digits of the left hand.) Stereognosis: Appears Intact Hot/Cold: Appears Intact Proprioception: Appears Intact Coordination Gross Motor Movements are Fluid and Coordinated: Yes Fine Motor Movements are Fluid and Coordinated: Yes Motor  Motor Motor: Within Functional Limits Motor - Discharge Observations: Pt with decreased high level balance and decreased endurance.  Mobility  Transfers Transfers: Sit to Stand;Stand to Sit Sit to Stand: 6: Modified independent (Device/Increase time) Stand to Sit: 6: Modified independent (Device/Increase time)  Trunk/Postural Assessment  Cervical Assessment Cervical Assessment: Exceptions to [facility] Cervical Strength Overall Cervical Strength Comments: forward head Thoracic Assessment Thoracic Assessment: Exceptions to [facility] Thoracic Strength Overall Thoracic Strength Comments: rounded shoulders Lumbar  Assessment Lumbar Assessment: Exceptions to [facility] Lumbar Strength Overall Lumbar Strength Comments: posterior pelvic tilt Postural Control Postural Limitations: continues  to have forward flexed posture during most static and dynamic tasks.   Balance Balance Balance Assessed: Yes Static Sitting Balance Static Sitting - Balance Support: No upper extremity supported;Feet supported Static Sitting - Level of Assistance: 7: Independent Dynamic Sitting Balance Dynamic Sitting - Balance Support: Feet supported;No upper extremity supported Dynamic Sitting - Level of Assistance: 6: Modified independent (Device/Increase time) Static Standing Balance Static Standing - Balance Support: No upper extremity supported Static Standing - Level of Assistance: 6: Modified independent (Device/Increase time) Dynamic Standing Balance Dynamic Standing - Balance Support: During functional activity;No upper extremity supported Dynamic Standing - Level of Assistance: 6: Modified independent (Device/Increase time) Extremity/Trunk Assessment RUE Assessment RUE Assessment: Within Functional Limits LUE Assessment LUE Assessment: Within Functional Limits  See FIM for current functional status  Libero Puthoff OTR/L 12/22/2014, 4:23 PM

## 2014-12-22 NOTE — Progress Notes (Signed)
ANTICOAGULATION CONSULT NOTE - Follow Up Consult  Pharmacy Consult for coumadin Indication: DVT  No Known Allergies  Patient Measurements: Height: 5\' 10"  (177.8 cm) Weight: 288 lb 3.2 oz (130.727 kg) IBW/kg (Calculated) : 73  Vital Signs: Temp: 98.1 F (36.7 C) (01/19 0543) Temp Source: Other (Comment) (01/19 0543) BP: 150/90 mmHg (01/19 0543) Pulse Rate: 87 (01/19 0543)  Labs:  Recent Labs  12/20/14 0535 12/21/14 0622 12/22/14 0626  LABPROT 26.5* 27.2* 28.8*  INR 2.42* 2.49* 2.69*    Estimated Creatinine Clearance: 153.5 mL/min (by C-G formula based on Cr of 0.79).   Medications:  Scheduled:  . citalopram  20 mg Oral Daily  . cloNIDine  0.1 mg Oral TID  . famotidine  20 mg Oral BID  . feeding supplement (GLUCERNA SHAKE)  237 mL Oral BID BM  . free water  200 mL Per Tube 3 times per day  . furosemide  40 mg Oral Daily  . hydrALAZINE  100 mg Oral 3 times per day  . hydrocortisone  25 mg Rectal BID  . insulin aspart  0-5 Units Subcutaneous QHS  . insulin aspart  0-9 Units Subcutaneous TID WC  . lisinopril  40 mg Oral BID  . magnesium oxide  200 mg Oral BID  . metoprolol tartrate  50 mg Oral BID  . nitroGLYCERIN  1 inch Topical BID  . potassium chloride  20 mEq Oral BID  . Warfarin - Pharmacist Dosing Inpatient   Does not apply q1800   Assessment: 49 yo male with DVT is currently on therapeutic coumadin.  INR today is 2.69. Goal of Therapy:  INR 2-3 Monitor platelets by anticoagulation protocol: Yes   Plan:  - Coumadin 2.5mg  PO today (may need 2.5 to 5 mg daily) - Daily PT / INR  Bayard HuggerMei Moise Friday, PharmD, BCPS  Clinical Pharmacist  Pager: [phone]   12/22/2014,11:25 AM

## 2014-12-22 NOTE — Progress Notes (Signed)
Occupational Therapy Note  Patient Details  Name: [name] MRN: [id number] Date of Birth: 1966/01/18  SATURATION QUALIFICATIONS: (This note is used to comply with regulatory documentation for home oxygen)  Patient Saturations on Room Air at Rest = 92%  Patient Saturations on Room Air while Ambulating = 85%  Patient Saturations on 3 Liters 28% trach collar oxygen while Ambulating = 91-93%  Please briefly explain why patient needs home oxygen:  Pt continues to demonstrate decreased oxygen sats on room air with mobility.  Feel he still requires supplemental O2 via trach to maintain sats greater than 90%.      Elle Vezina OTR/L 12/22/2014, 2:30 PM

## 2014-12-22 NOTE — Progress Notes (Addendum)
Pt seen at this time for trach education.  No family at bedside.  CSW would like teaching to start with patient today and will start with sister 12/23/14 at 0930.  Mirror used for teaching.  Pt seemed involved and asked many questions.  He has already been educated on PMV and is able to place on and off.  Janina Mayorach book given to pt and we went over it together in room. Pt was able to perform trach care and change his inner cannula.  Will go over suction and trach tie change tomorrow when sister is present.  Pt has strong cough, he removes PMV, holds towel in front of trach & coughs to remove secretions. Will follow back up in the am.

## 2014-12-22 NOTE — Progress Notes (Signed)
Social Work Patient ID: Andrew Sparks, male   DOB: [birth year], 49 y.o.   MRN: [id number] Met with pt to discuss plan for tomorrow, his sister and niece are coming at 55;30 to learn his trach care and attend therapies.  His supplies have been ordered via [facility] to be delivered to home and His hospital room.  He has an appointment at the [facility] in [place] 1/26 for medical follow.  [facility] to provide [facility] for reinforced teaching and follow up.  See how tomorrow goes.

## 2014-12-23 ENCOUNTER — Encounter (HOSPITAL_COMMUNITY): Payer: Self-pay | Admitting: Occupational Therapy

## 2014-12-23 ENCOUNTER — Ambulatory Visit (HOSPITAL_COMMUNITY): Payer: Self-pay | Admitting: Rehabilitation

## 2014-12-23 ENCOUNTER — Inpatient Hospital Stay (HOSPITAL_COMMUNITY): Payer: Medicaid Other | Admitting: Speech Pathology

## 2014-12-23 LAB — PROTIME-INR
INR: 2.48 — ABNORMAL HIGH (ref 0.00–1.49)
Prothrombin Time: 27 seconds — ABNORMAL HIGH (ref 11.6–15.2)

## 2014-12-23 LAB — GLUCOSE, CAPILLARY
GLUCOSE-CAPILLARY: 97 mg/dL (ref 70–99)
Glucose-Capillary: 109 mg/dL — ABNORMAL HIGH (ref 70–99)

## 2014-12-23 MED ORDER — FAMOTIDINE 20 MG PO TABS: 20.0000 mg | ORAL_TABLET | Freq: Two times a day (BID) | ORAL | Status: DC

## 2014-12-23 MED ORDER — METOPROLOL TARTRATE 50 MG PO TABS: 50.0000 mg | ORAL_TABLET | Freq: Three times a day (TID) | ORAL | Status: DC

## 2014-12-23 MED ORDER — POTASSIUM CHLORIDE CRYS ER 20 MEQ PO TBCR: 20.0000 meq | EXTENDED_RELEASE_TABLET | Freq: Two times a day (BID) | ORAL | Status: DC

## 2014-12-23 MED ORDER — CITALOPRAM HYDROBROMIDE 20 MG PO TABS: 20.0000 mg | ORAL_TABLET | Freq: Every day | ORAL | Status: DC

## 2014-12-23 MED ORDER — FREE WATER: 100.0000 mL | Freq: Two times a day (BID) | Status: DC

## 2014-12-23 MED ORDER — NITROGLYCERIN 0.2 MG/HR TD PT24
0.2000 mg | MEDICATED_PATCH | Freq: Every day | TRANSDERMAL | Status: DC
  Administered 2014-12-23: 0.2 mg via TRANSDERMAL
  Filled 2014-12-23 (×2): qty 1

## 2014-12-23 MED ORDER — HYDRALAZINE HCL 100 MG PO TABS: 100.0000 mg | ORAL_TABLET | Freq: Three times a day (TID) | ORAL | Status: DC

## 2014-12-23 MED ORDER — ALPRAZOLAM 0.25 MG PO TABS: 0.2500 mg | ORAL_TABLET | Freq: Two times a day (BID) | ORAL | Status: DC

## 2014-12-23 MED ORDER — WARFARIN SODIUM 5 MG PO TABS: ORAL_TABLET | ORAL | Status: DC

## 2014-12-23 MED ORDER — HYDROCORTISONE ACETATE 25 MG RE SUPP: 25.0000 mg | Freq: Two times a day (BID) | RECTAL | Status: DC

## 2014-12-23 MED ORDER — MAGNESIUM OXIDE 400 (241.3 MG) MG PO TABS: 200.0000 mg | ORAL_TABLET | Freq: Two times a day (BID) | ORAL | Status: DC

## 2014-12-23 MED ORDER — FUROSEMIDE 40 MG PO TABS: 40.0000 mg | ORAL_TABLET | Freq: Every day | ORAL | Status: DC

## 2014-12-23 MED ORDER — LISINOPRIL 40 MG PO TABS: 40.0000 mg | ORAL_TABLET | Freq: Two times a day (BID) | ORAL | Status: DC

## 2014-12-23 MED ORDER — NITROGLYCERIN 0.2 MG/HR TD PT24: 0.2000 mg | MEDICATED_PATCH | Freq: Every day | TRANSDERMAL | Status: DC

## 2014-12-23 MED ORDER — CLONIDINE HCL 0.1 MG PO TABS: 0.1000 mg | ORAL_TABLET | Freq: Three times a day (TID) | ORAL | Status: DC

## 2014-12-23 NOTE — Progress Notes (Signed)
Subjective/Complaints: No issues overnight. discussed G-tube   Review of Systems - Negative except some blood per rectum with BM Objective: Vital Signs: Blood pressure 130/72, pulse 86, temperature 98.4 F (36.9 C), temperature source Oral, resp. rate 17, height  (1.778 m), weight 128.2 kg (282 lb 10.1 oz), SpO2 96 %. Dg Chest 2 View  12/21/2014   CLINICAL DATA:  Shortness of breath, tracheostomy  EXAM: CHEST  2 VIEW  COMPARISON:  12/18/2014  FINDINGS: Tract colostomy tube is unchanged in position. Cardiomegaly. No infiltrate or pulmonary edema. Mild basilar atelectasis. No pneumothorax.  IMPRESSION: Stable tracheostomy tube position. Cardiomegaly. Mild basilar atelectasis. No infiltrate or pulmonary edema.   Electronically Signed   By: Natasha Mead M.D.   On: 12/21/2014 10:39   Results for orders placed or performed during the hospital encounter of 12/15/14 (from the past 72 hour(s))  Glucose, capillary     Status: None   Collection Time: 12/20/14 12:04 PM  Result Value Ref Range   Glucose-Capillary 90 70 - 99 mg/dL   Comment 1 Notify RN   Glucose, capillary     Status: Abnormal   Collection Time: 12/20/14  4:26 PM  Result Value Ref Range   Glucose-Capillary 113 (H) 70 - 99 mg/dL   Comment 1 Notify RN   Glucose, capillary     Status: None   Collection Time: 12/20/14  8:53 PM  Result Value Ref Range   Glucose-Capillary 96 70 - 99 mg/dL  Protime-INR     Status: Abnormal   Collection Time: 12/21/14  6:22 AM  Result Value Ref Range   Prothrombin Time 27.2 (H) 11.6 - 15.2 seconds   INR 2.49 (H) 0.00 - 1.49  Glucose, capillary     Status: Abnormal   Collection Time: 12/21/14  6:54 AM  Result Value Ref Range   Glucose-Capillary 106 (H) 70 - 99 mg/dL  Glucose, capillary     Status: None   Collection Time: 12/21/14  7:25 AM  Result Value Ref Range   Glucose-Capillary 91 70 - 99 mg/dL  Glucose, capillary     Status: Abnormal   Collection Time: 12/21/14 11:32 AM  Result Value  Ref Range   Glucose-Capillary 126 (H) 70 - 99 mg/dL  Glucose, capillary     Status: Abnormal   Collection Time: 12/21/14  4:38 PM  Result Value Ref Range   Glucose-Capillary 111 (H) 70 - 99 mg/dL  Glucose, capillary     Status: Abnormal   Collection Time: 12/21/14  8:51 PM  Result Value Ref Range   Glucose-Capillary 113 (H) 70 - 99 mg/dL  Protime-INR     Status: Abnormal   Collection Time: 12/22/14  6:26 AM  Result Value Ref Range   Prothrombin Time 28.8 (H) 11.6 - 15.2 seconds   INR 2.69 (H) 0.00 - 1.49  Glucose, capillary     Status: Abnormal   Collection Time: 12/22/14  6:30 AM  Result Value Ref Range   Glucose-Capillary 102 (H) 70 - 99 mg/dL  Glucose, capillary     Status: Abnormal   Collection Time: 12/22/14 12:27 PM  Result Value Ref Range   Glucose-Capillary 119 (H) 70 - 99 mg/dL  Glucose, capillary     Status: None   Collection Time: 12/22/14  4:26 PM  Result Value Ref Range   Glucose-Capillary 99 70 - 99 mg/dL  Glucose, capillary     Status: Abnormal   Collection Time: 12/22/14  8:52 PM  Result Value Ref Range  Glucose-Capillary 100 (H) 70 - 99 mg/dL   Comment 1 Notify RN   Glucose, capillary     Status: None   Collection Time: 12/23/14  6:55 AM  Result Value Ref Range   Glucose-Capillary 97 70 - 99 mg/dL   Comment 1 Notify RN   Protime-INR     Status: Abnormal   Collection Time: 12/23/14  7:27 AM  Result Value Ref Range   Prothrombin Time 27.0 (H) 11.6 - 15.2 seconds   INR 2.48 (H) 0.00 - 1.49     HEENT: trach site ok #6 Cardio: RRR and no murmur Resp: CTA B/L and unlabored GI: BS positive and NT, ND Extremity:  Pulses positive and Edema BLE pretib and pedal 2+ Skin:   Other stasis dermatitis bilateral pretibial Neuro: Alert/Oriented, Cranial Nerve II-XII normal, Normal Sensory and Abnormal Motor 4/5 bilateral deltoids, biceps, triceps, grip, hip flexors, knee extensor, ankle dorsiflexor and plantar flexor Musc/Skel:  Normal Gen. no acute distress  Mood  and affect appropriate   Assessment/Plan: 1. Functional deficits secondary to encephalopathy and deconditioning  Stable for D/C today F/u PCP in 1-2 weeks F/u PM&R 3 weeks See D/C summary See D/C instructions PEG removal can occur around February 15, not using for nutrition or medications at this point. FIM: FIM - Bathing Bathing Steps Patient Completed: Chest, Right Arm, Left Arm, Abdomen, Front perineal area, Buttocks, Right upper leg, Left upper leg, Right lower leg (including foot), Left lower leg (including foot) Bathing: 6: More than reasonable amount of time  FIM - Upper Body Dressing/Undressing Upper body dressing/undressing steps patient completed: Thread/unthread right sleeve of pullover shirt/dresss, Thread/unthread left sleeve of pullover shirt/dress, Put head through opening of pull over shirt/dress, Pull shirt over trunk Upper body dressing/undressing: 6: More than reasonable amount of time FIM - Lower Body Dressing/Undressing Lower body dressing/undressing steps patient completed: Thread/unthread right pants leg, Thread/unthread left pants leg, Pull pants up/down, Don/Doff right sock, Don/Doff left sock Lower body dressing/undressing: 6: More than reasonable amount of time  FIM - Toileting Toileting steps completed by patient: Adjust clothing prior to toileting, Performs perineal hygiene, Adjust clothing after toileting Toileting Assistive Devices: Grab bar or rail for support Toileting: 7: Independent: No helper, no device  FIM - Diplomatic Services operational officer Devices: Art gallery manager Transfers: 7-Independent: No helper  FIM - Banker Devices:  (none, oxygen) Bed/Chair Transfer: 7: Independent: No helper  FIM - Locomotion: Wheelchair Distance:  (pt ambulatory) Locomotion: Wheelchair: 0: Activity did not occur (pt ambulatory) FIM - Locomotion: Ambulation Locomotion: Ambulation Assistive Devices: Other (comment)  (O2 tank, no other AD) Ambulation/Gait Assistance: 6: Modified independent (Device/Increase time) Locomotion: Ambulation: 6: Travels 150 ft or more independently/takes more than reasonable amount of time  Comprehension Comprehension Mode: Auditory Comprehension: 7-Follows complex conversation/direction: With no assist  Expression Expression Mode: Verbal Expression Assistive Devices: 6-Talk trach valve Expression: 5-Expresses basic 90% of the time/requires cueing < 10% of the time.  Social Interaction Social Interaction: 6-Interacts appropriately with others with medication or extra time (anti-anxiety, antidepressant).  Problem Solving Problem Solving: 5-Solves basic 90% of the time/requires cueing < 10% of the time  Memory Memory: 5-Recognizes or recalls 90% of the time/requires cueing < 10% of the time   Medical Problem List and Plan: 1. Functional deficits secondary to Encephalopathy past respiratory failure 2. RUE DVT treatment/ Anticoagulation: Pharmaceutical: Coumadin, ? If this is needed given brachial, ? At former PICC site  3. Pain Management: Will change oxycodone to  prn as denies pain at this moment.  4. Anxiety disorder/Mood: Continue celexa daily with xanax bid. LCSW to follow with patient and family for evaluation and support.  5. Neuropsych: This patient is capable of making decisions on her own behalf. 6. Skin/Wound Care: Routine pressure relief measures. Monitor PEG and trach site every shift.  7. Fluids/Electrolytes/Nutrition: Check daily weights. Monitor for signs of overload. Encourage appropriate diet. Add protein supplement to help with edema. reduce H20 Flush as po increases 8. OSA/OHS: Reports chronic SOB as well as non-compliance. Will need to lose weight prior to decannulation. PMSV with supervision and keep trach open at nights.  9. Diastolic CHF: Monitor daily weights. Low salt diet. Continue lasix, lisinopril and metoprolol.  10. COPD: Educated on  tobacco cessation. Will add nebs qid for now.  LOS (Days) 8 A FACE TO FACE EVALUATION WAS PERFORMED  KIRSTEINS,ANDREW E 12/23/2014, 9:35 AM

## 2014-12-23 NOTE — Progress Notes (Signed)
Trach education performed with sister. Educated on suctioning, trach care, changing trach ties. Went over the trach care book that Lauren give them yesterday. Pt has good strong cough & removes PMV to cough secretions into wash cloth.  Jacqulynn CadetHopper, Maddyn Lieurance David RRT

## 2014-12-23 NOTE — Progress Notes (Signed)
Speech Language Pathology Discharge Summary  Patient Details  Name: Andrew Sparks MRN: [id number] Date of Birth: 11-29-1966  Today's Date: 12/23/2014 SLP Individual Time: 0945-1000 SLP Individual Time Calculation (min): 15 min   Skilled Therapeutic Interventions:  Skilled SLP services focused on addressing education with patient and sister regarding PMSV and swallow function.  Patient currently Mod I for PMSV donning and doffing, was able to demonstrate this and required Supervision verbal cues to utilize external aid for recall of procedures to clean PMSV.  Patient also tolerating regular textures and thin liquids with overall Mod I.  Patient and sister asked appropriate questions regarding home management and were provided with handouts to assist with recall, both verbalized understanding of information.  Patient has met 5 of 5 long term goals.  Patient to discharge at overall Modified Independent;Supervision level.  Reasons goals not met:  N/A  Clinical Impression/Discharge Summary: Patient met 5 out of 5 long term goals during this admission due to functional gains in PMSV toleration and management as well as swallow function and recall of new information related to his medical management. Patient Mod I with regular textures and thin liquids.  Family education completed with sister due to Intermittent assist with complex medical management of PMSV and trach care due to decreased recall of new information.  Patient reports anxiety contributes to his deceased recall and if SLP sets-up external aids patient is able to recall information/steps Mod I; sister verbalized understanding of information. Patient would benefit from follow-up trach care as well as SLP follow up to maximize functional independence.   Care Partner:  Caregiver Able to Provide Assistance: Yes  Type of Caregiver Assistance:  (Intermittent assist wtih complex medical management of PMSV and trach care)  Recommendation:   Outpatient SLP;Home Health SLP;Other (comment) (Intermittent supervision )  Rationale for SLP Follow Up: Maximize functional communication;Reduce caregiver burden;Maximize cognitive function and independence   Equipment: suctioning   Reasons for discharge: Treatment goals met;Discharged from hospital   Patient/Family Agrees with Progress Made and Goals Achieved: Yes   See FIM for current functional status  Carmelia Roller., CCC-SLP [phone]  [place] 12/23/2014, 3:47 PM

## 2014-12-23 NOTE — Progress Notes (Signed)
Social Work Discharge Note Discharge Note  The overall goal for the admission was met for:   Discharge location: Yes-HOME WITH SISTER AND NIECE WHO CAN PROVIDE 24 HR SUPERVISION  Length of Stay: Yes-8 DAYS  Discharge activity level: Yes-SUPERVISION LEVEL  Home/community participation: Yes  Services provided included: MD, RD, PT, OT, SLP, RN, CM, Pharmacy and SW  Financial Services: Private Insurance: PENDING MEDICAID  Follow-up services arranged: Home Health: [facility] RN ONLY DUE TO MEDICAID , DME: [facility]IDE ROLL[facility], PORTABLE O2, TRACH SUPPLIES, CONTINUOUS O2 and Patient/Family has no preference for HH/DME agencies  Comments (or additional information):SISTER AND NIECE [place] APPT 1/26 6;30 PM FOR PCP [facility]. PENDING SSD AND MEDICAID SISTER TO FOLLOW UP WITH. MATCH PROGRAM PAPERS GIVEN TO PT AND SISTER  Patient/Family verbalized understanding of follow-up arrangements: Yes  Individual responsible for coordination of the follow-up plan: RENEE-SISTER  Confirmed correct DME delivered: Elease Hashimoto 12/23/2014    Elease Hashimoto

## 2014-12-23 NOTE — Progress Notes (Signed)
Pt. And family Got trach care education,Pt. Got education and instructions about PEG tube maintenance,pt. Got discharge orders and follow up appointments instructions.Pt. Ready to go home with his family.

## 2014-12-24 MED ORDER — WARFARIN SODIUM 5 MG PO TABS: ORAL_TABLET | ORAL | Status: DC

## 2015-01-12 DIAGNOSIS — J441 Chronic obstructive pulmonary disease with (acute) exacerbation: Secondary | ICD-10-CM

## 2015-01-12 DIAGNOSIS — Z930 Tracheostomy status: Secondary | ICD-10-CM

## 2015-01-12 DIAGNOSIS — F17210 Nicotine dependence, cigarettes, uncomplicated: Secondary | ICD-10-CM

## 2015-01-12 DIAGNOSIS — Z9981 Dependence on supplemental oxygen: Secondary | ICD-10-CM

## 2015-01-12 DIAGNOSIS — E669 Obesity, unspecified: Secondary | ICD-10-CM

## 2015-01-12 DIAGNOSIS — I5031 Acute diastolic (congestive) heart failure: Secondary | ICD-10-CM

## 2015-03-27 NOTE — Consult Note (Signed)
   Comments   I met with pt's ex-wife, Rodena Piety [phone]). She was visiting in the room and is the mother of pt's son, Jahmad Petrich [phone]). She says her son does not want to make medical decisions and would prefer to defer to pt's siblings. However, will also clarify that with him.  Electronic Signatures: Borders, Kirt Boys (NP)  (Signed 17-Dec-15 13:15)  Authored: Palliative Care Phifer, Izora Gala (MD)  (Signed 17-Dec-15 16:39)  Authored: Palliative Care   Last Updated: 17-Dec-15 16:39 by Phifer, Izora Gala (MD)

## 2015-03-27 NOTE — Consult Note (Signed)
Chief Complaint:  Subjective/Chief Complaint S/P G tube placement. On vent. Abdomen distended like before. G site looks fine.   VITAL SIGNS/ANCILLARY NOTES: **Vital Signs.:   30-Dec-15 06:00  Temperature Temperature (F) 98.3  Celsius 36.8  Temperature Source oral  Pulse Pulse 75  Respirations Respirations 18  Systolic BP Systolic BP 681  Diastolic BP (mmHg) Diastolic BP (mmHg) 74  Mean BP 89  BP Source  if not from Vital Sign Device non-invasive  Pulse Ox % Pulse Ox % 97  Pulse Ox Activity Level  At rest  Oxygen Delivery Ventilator Assisted  CO2 Monitor CO2 Monitor 47   Brief Assessment:  GEN no acute distress   Cardiac Regular   Respiratory clear BS  no use of accessory muscles  On vent   Gastrointestinal G site looks fine.   Gastrointestinal details normal Distended with bowel sounds. G site looks intact.   Lab Results: Routine Chem:  30-Dec-15 04:21   Result Comment LABS - This specimen was collected through an   - indwelling catheter or arterial line.  - A minimum of 6mls of blood was wasted prior    - to collecting the sample.  Interpret  - results with caution.  Result(s) reported on 02 Dec 2014 at 04:49AM.  Magnesium, Serum 1.8 (1.8-2.4 THERAPEUTIC RANGE: 4-7 mg/dL TOXIC: > 10 mg/dL  -----------------------)  Phosphorus, Serum 3.8 (Result(s) reported on 02 Dec 2014 at 04:52AM.)  Glucose, Serum 94  BUN  24  Creatinine (comp) 0.76  Sodium, Serum 140  Potassium, Serum 3.8  Chloride, Serum  96  CO2, Serum  38  Calcium (Total), Serum 9.3  Anion Gap  6  Osmolality (calc) 283  eGFR (African American) >60  eGFR (Non-African American) >60 (eGFR values <64mL/min/1.73 m2 may be an indication of chronic kidney disease (CKD). Calculated eGFR, using the MRDR Study equation, is useful in  patients with stable renal function. The eGFR calculation will not be reliable in acutely ill patients when serum creatinine is changing rapidly. It is not useful in patients  on dialysis. The eGFR calculation may not be applicable to patients at the low and high extremes of body sizes, pregnant women, and vegetarians.)  Triglycerides, Serum 118 (Result(s) reported on 02 Dec 2014 at 04:52AM.)   Assessment/Plan:  Assessment/Plan:  Assessment S/P G tube placement.   Plan Ok to start TF. Was able to tolerate TF via NG before. Will sign off. Call back if there are issues. Thanks   Electronic Signatures: Verdie Shire (MD)  (Signed 30-Dec-15 08:14)  Authored: Chief Complaint, VITAL SIGNS/ANCILLARY NOTES, Brief Assessment, Lab Results, Assessment/Plan   Last Updated: 30-Dec-15 08:14 by Verdie Shire (MD)

## 2015-03-27 NOTE — Consult Note (Signed)
   Present Illness Patient is a 49 year old male with history of hypertension who was admitted after presenting to the emergency room with worsening shortness of breath.  He developed worsening hypoxia and reduced sensorium.  He was intubated.  He currently remains intubated.  He is not being compliant with his medications.  Echocardiogram revealed severely hypertrophied ventricle with an ejection fraction of 70-75% with severe LVH.  He continues to be sedated and intubated unable to give history.  He has on mild serum troponin elevation.  Chest x-ray reveals mild pulmonary edema.   Physical Exam:  GEN obese, critically ill appearing   NECK No masses   RESP Breathing with ventilator   CARD Regular rate and rhythm  Tachycardic   ABD distended   EXTR positive edema   SKIN normal to palpation   PSYCH sedated   Review of Systems:  Subjective/Chief Complaint intubated and unable to give history   ROS Pt not able to provide ROS   Medications/Allergies Reviewed Medications/Allergies reviewed   EKG:  EKG NSR   Abnormal NSSTTW changes   Interpretation with left ventricular hypertrophy    No Known Allergies:    Impression 49 year old with history of hypertension who presents to the emergency room with progressive shortness of breath and altered mental status.  He was noted to be significantly hypercapnic.  He was electively intubated in the emergency room for this.  He had been noncompliant with his medications.  He has evidence of pulmonary edema.  He has a miles term troponin elevation with no injury current on electrocardiogram.  He is currently intubated unable to give a history.  His elevated serum troponin is likely secondary to respiratory failure in severe LVH his LVH is likely secondary to poorly treated hypertension.  Will continue to support Ventolin tori status with the ventilator in wean as tolerated.  Would continue to rule out from myocardia infarction.  Will continue to  maintain blood pressure control as possible.  After extubation, consideration for further evaluation of the patient's cardiac risk could be carried out either invasively or noninvasively.  Patient has acute on chronic diastolic heart failure.  New [place] heart Association class 4.   Plan 1. Continue to support respiratory failure with ventilator 2. Wean vent as tolerated 3. control blood pressure 4. Gently diurese 5. Elevated serum troponin his Likely secondary to his diastolic heart failure. 6. Compliance with medications will need to be discussed when extubated 7. Weight loss 8. Further recommendations pending course   Electronic Signatures: Dalia HeadingFath, Kenneth A (MD)  (Signed 15-Dec-15 20:28)  Authored: General Aspect/Present Illness, History and Physical Exam, Review of System, EKG , Allergies, Impression/Plan   Last Updated: 15-Dec-15 20:28 by Dalia HeadingFath, Kenneth A (MD)

## 2015-03-27 NOTE — Consult Note (Signed)
   Comments   I called and spoke with pt's brother who is in [place] and will be visiting this weekend. I updated him. He relayed the same concerns as pt's sister regarding pt's previous lifestyle choices. Brother does not seem to feel patient would want a trach or continued aggressive care. However, family is trying to reach out to pt's son who may ultimately be the decision maker (although his age is unclear, ie. [id number]).  follow.   Electronic Signatures: Borders, Daryl EasternJoshua R (NP)  (Signed 17-Dec-15 10:08)  Authored: Palliative Care Phifer, Harriett SineNancy (MD)  (Signed 17-Dec-15 12:51)  Authored: Palliative Care   Last Updated: 17-Dec-15 12:51 by Phifer, Harriett SineNancy (MD)

## 2015-03-27 NOTE — Consult Note (Signed)
   Comments   Josh Borders, NP, and I met with pt's sister. We once again discussed the options of trach/PEG vs extubation to comfort care. Sister knows that her brother does not agree with trach. Sister is struggling with the decision. We discussed at lenght and sister says she know that pt, if given the chance, would not want a trach. She is now in agreement with extubation to comfort care.  will be here on Monday, 12/28. At that time, Palliative Care team will meet with brother and sister for probable extubation. Continue current care for now. However, pt is DNR so will not do CPR/defibrillation in the event of cardiac arrest.    Electronic Signatures: Jaeson Molstad, Izora Gala (MD)  (Signed 23-Dec-15 15:05)  Authored: Palliative Care   Last Updated: 23-Dec-15 15:05 by Nailani Full, Izora Gala (MD)

## 2015-03-27 NOTE — Discharge Summary (Signed)
PATIENT NAME:  Andrew MungoSHERS, [name] MR#:  [id number] DATE OF BIRTH:  03-28-1966  DATE OF ADMISSION:  11/16/2014 DATE OF DISCHARGE:   PRIMARY CARE PHYSICIAN:  None local.  REASON FOR ADMISSION: Brought in via the shortness of breath.   CONSULTATIONS: Cardiology, Dr. Lady GaryFath, pulmonary physician, Dr. Belia HemanKasa, primary care physician Dr. Harvie JuniorPhifer.   HOSPITAL COURSE: The patient is a 49 year old African American male with a history of hypertension, obesity, tobacco abuse who was brought to ED due to shortness of breath. The patient was in respiratory distress needing to be put on BiPAP, felt to be in congestive heart failure and then patient was intubated.  For detailed history and physical examination please refer to the admission note dictated by Dr. Renae GlossWieting.  Laboratory data on admission date showed BNP 1530, a pH 7.2, pCO2 of 99, pO2 86, bicarbonate 7. Chest x-ray showed moderate bilateral central lung perihilar  pacification suggesting interstitial edema.   1.  Acute hypercapnic hypoxic respiratory failure with acidosis and acute encephalopathy due to acute diastolic CHD and COPD exacerbation. The patient was intubated on 11/16/2014 and has been on ventilation and nebulizer treatment. In addition, the patient has been treated with Lasix and Solu-Medrol.  The patient self extubated one week ago, but was reintubated due to poor respiratory failure. The patient has been treated with ventilation since admission. His blood pressure is stable. Urine output is good. Dr. Belia HemanKasa tried to wean off ventilation but failed several times. Dr. Harvie JuniorPhifer discussed with the patient's family members, including patient's sister. The patient's sister wanted PEG and trach placement but has not decided yet.  2.  Accelerated hypertension. The patient has been treated with Nitropaste, Lopressor, enalapril and the Lasix. Blood pressure has been under control.  3.  Obesity.  4.  Elevated troponin is secondary to acute respiratory failure.    CURRENT DIAGNOSES:   1.  Acute respiratory failure with acidosis.  2.  Acute encephalopathy.  3.  Acute diastolic congestive heart failure.  4.  Chronic obstructive pulmonary disease exacerbation.  5.  Obesity.  6.  Accelerated hypertension.  7.  Elevated troponin  ____________________________ Shaune PollackQing Lusine Corlett, MD qc:at D: 11/26/2014 08:12:00 ET T: 11/26/2014 09:49:20 ET JOB#: [id number]  cc: Shaune PollackQing Dajia Gunnels, MD, <Dictator> Shaune PollackQING Mitsue Peery MD ELECTRONICALLY SIGNED 11/26/2014 11:52

## 2015-03-27 NOTE — Consult Note (Signed)
PATIENT NAME:  Andrew Sparks, Andrew Sparks MR#:  [id number] DATE OF BIRTH:  1966-06-10  DATE OF CONSULTATION:  11/30/2014  REFERRING PHYSICIAN:   CONSULTING PHYSICIAN:  Andrew Sparks, Andrew Sparks (Adult Nurse Practitioner)  REFERRING PHYSICIAN; Dr. Debe Coder PHYSICIAN: Andrew Sparks, Andrew Sparks, Andrew Sparks (Adult Nurse Practitioner).  REASON FOR CONSULTATION: PEG tube placement.   HISTORY OF PRESENT ILLNESS: This 49 year old patient with history of hypertension, severe obstructive sleep apnea, obesity, tobacco abuse was brought to the Emergency Room for shortness of breath on 11/16/2014. The patient did not tolerate a BiPAP and did require intubation. The patient self-extubated 11/19/2014 and had to be reintubated the following day. The patient has shown that he is ventilator dependent. His sister and palliative care team have communicated multiple times regarding the patient's wishes and the decision was made by the sister to proceed with tracheostomy, PEG tube placement, and skilled nursing facility placement. GI has been asked to see the patient for the PEG tube placement.   The patient is currently intubated and on propofol drip. He does arouse some and squeeze hands when is more awake. He has had control of supraventricular tachycardia with metoprolol. He is not on pressors. He has maintained adequate blood pressure and heart rate. He has had a fever of 101 to 99.2. He has a chest x-ray ordered for tomorrow morning. He does have known diastolic dysfunction and most recent chest x-ray showed cardiomegaly with vascular congestion/edema. He has had no overt problems with congestive heart failure during this admission. He has had constipation and has required laxative with fair results. He does have obesity, protuberant abdomen with small umbilical hernia. The patient has not complained of abdominal pain. He has been tolerating tube feedings without difficulty.   PAST MEDICAL HISTORY: 1.  Hypertension.   2.  Medical noncompliance.  3.  History of cerebrovascular accident.  4. Hyperlipidemia.  5.  Obesity. 6.  Severe obstructive sleep apnea to the point where the patient was falling asleep while cooking, dropping cigarettes, only able to sleep sitting up in a chair.  SOCIAL: Positive tobacco, alcohol on weekends. He was reportedly kicked out of his sister's home and he has been living in a homeless shelter. His brother and sister are actively involved in his care. He does have an 18 year old son.   LABORATORY AND DIAGNOSTICS: 1.  Laboratory studies today with BUN 27, creatinine 0.79, potassium 3.4. His hemoglobin is 13.2. WBC 10.1. ABG today with pH 7.560, PCO2 is 47, PO2 is 57, HCO3 is 18. Oxygen saturation 93.9% on ventilator.  2.  Recent abdominal film AP only 11/28/2014 showed nonobstructive bowel gas pattern. Enlargement of cardiac silhouette with streaky atelectasis of left lower lobe. 3.  Chest x-ray, single view, 11/28/2015 with cardiomegaly with vascular congestion or edema. Densities at the lung bases could be related to volume loss and/or pleural effusion. Nasogastric tip is in the expected location of the gastric fundus. Endotracheal tube is present.   PHYSICAL EXAMINATION: VITAL SIGNS: Temperature 99.2, pulse 85, respiratory rate 21, blood pressure  125/75. Temperature was 101 during the night.  GENERAL: Obese, critically ill-appearing, large framed, African American male, sedated. He is not responding at this time.  HEENT: Shows pale conjunctivae. He has mild blepharitis in the left eye.  NECK: Supple. Trachea is midline. Respiratory effort is regular, on ventilator, no use of accessory muscles.  CARDIAC: S1, S2 without murmur.  ABDOMEN: Large size, protuberant, soft, but distended. Bowel sounds are present. Umbilical hernia is present.  No abdominal surgical scars are obvious.  GENITOURINARY: With Foley catheter in place.  EXTREMITIES: With edema, especially on the right, entire  right arm and hand.  SKIN: Warm and dry. Turgor is good.  NEUROLOGIC: On ventilation, on propofol.  PSYCHIATRIC: Sedated.   IMPRESSION: 1.  The patient admitted 11/16/2014 for shortness of breath with diagnosis of acute respiratory failure with acidosis, acute encephalopathy, acute diastolic congestive heart failure, chronic obstructive pulmonary disease, obesity, accelerated hypertension, and elevated troponin without admission diagnosis of myocardial infarction. Etiology thought to be secondary to acute respiratory failure. He does present with inability to be extubated. There is request by the sister to have trach tube and PEG tube placement.  2.  Diastolic congestive heart failure secondary to medical noncompliance and accelerated hypertension. Chest x-ray is ordered for the morning.  3.  Chronic obstructive pulmonary disease exacerbation with elevated CO2 on admission.  4.  Accelerated hypertension, improved.  5.  Obesity, and Dr. Bluford Sparks to make decision on PEG tube placement regarding abdominal girth size.  6.  Right brachial deep vein thrombosis, he is on Lovenox b.i.d. There is right arm swelling consistent with this history.  7.  Abdominal distention, mild constipation, on laxatives. Recent abdominal film unremarkable.   PLAN: 1.  We will obtain PT/INR in the morning.  2.  Evaluate chest x-ray findings in the morning to make sure the patient does not have contraindication to upper endoscopy procedure.  3.  Dr. Bluford Sparks in to evaluate the patient and make decision on timing of PEG tube. 4.  This case was discussed in collaboration of care with Dr. Bluford Sparks. Further gastroenterology recommendations pending. Thank you for this consultation.  These services provided by Andrew Sparks, Andrew Sparks under collaborative agreement with Andrew StandingPaul Y. Bluford Kaufmannh, Andrew Sparks.   ____________________________ Andrew PlumberKimberly A. Arvilla Sparks, Andrew Sparks (Adult Nurse Practitioner) kam:sw D: 11/30/2014 17:28:11 ET T: 11/30/2014 18:06:34  ET JOB#: [id number]  cc: Andrew Sparks, Andrew Sparks (Adult Nurse Practitioner), <Dictator> Andrew PlumberKimberly A. Suzette BattiestMills RN, MSN, Andrew Sparks-BC Adult Nurse Practitioner ELECTRONICALLY SIGNED 12/01/2014 8:32

## 2015-03-27 NOTE — Consult Note (Signed)
20 Fr G tube successfully placed. Prophylactic Abx given. Insertion site at 4.5 cm. Can start using g tube for flushings and meds today. If remains stable, can start TF tomorrow. thanks.  Electronic Signatures: Lutricia Feilh, Rachael Ferrie (MD)  (Signed on 29-Dec-15 10:25)  Authored  Last Updated: 29-Dec-15 10:25 by Lutricia Feilh, Luian Schumpert (MD)

## 2015-03-27 NOTE — H&P (Signed)
PATIENT NAME:  Andrew Sparks, Andrew Sparks MR#:  [id number] DATE OF BIRTH:  02/07/1966  DATE OF ADMISSION:  11/16/2014  PRIMARY CARE PHYSICIAN: Unknown.   CHIEF COMPLAINT: Brought in with shortness of breath.   HISTORY OF PRESENT ILLNESS: This is a 49 year old man brought in with respiratory distress needing to be put on BiPAP, initially thought to be congestive heart failure. When I walked in the room he was on BiPAP and unresponsive to sternal rub. I spoke with Dr. Mayford KnifeWilliams the ER physician about potential intubation, Dr. Mayford KnifeWilliams wanted to watch him a little bit further and get a further ABG and determine what to do from there.  All history obtained from old chart.   PAST MEDICAL HISTORY: Hypertension, obesity, anxiety, tobacco abuse, hyperlipidemia.   PAST SURGICAL HISTORY: Unknown.   MEDICATIONS: Unknown.   ALLERGIES: No known drug allergies.   SOCIAL HISTORY: As per old chart. Smoking 1 pack per day. Occasional alcohol. No drug use.   FAMILY HISTORY: Father's side had diabetes and hypertension.    REVIEW OF SYSTEMS: Unable to obtain secondary to altered mental status.   PHYSICAL EXAMINATION:  VITAL SIGNS: Temperature 98.5, pulse 58, respirations 18, blood pressure 154/117, pulse oximetry 98% on BiPAP. When he presented to the ER pulse oximetry was 84% on room air, blood pressure 184/134, respirations 20.  GENERAL: Positive respiratory distress, using accessory muscles.  EYES: Conjunctivae and lids normal. Pupils equal, round, and reactive to light. Unable to test extraocular muscles.  EARS, NOSE, MOUTH, AND THROAT: Tympanic membranes, no erythema. Nasal mucosa, no erythema. Unable to examine mouth secondary to altered mental status.  NECK: Positive for JVD. No bruits. No lymphadenopathy. No thyromegaly. No thyroid nodules palpated.  RESPIRATORY: Decreased breath sounds bilaterally. Positive rales halfway up lung field. Positive use of accessory muscles to breathe.  CARDIOVASCULAR SYSTEM: S1,  S2 normal. No gallops, rubs, or murmurs heard. Carotid upstroke 2 + bilaterally. No bruits. Dorsalis pedis pulses 1 + bilaterally. 2 + edema bilateral lower extremity.  ABDOMEN: Soft, nontender. No organomegaly/splenomegaly. Normoactive bowel sounds. No masses felt.  LYMPHATIC: No lymph nodes in the neck.  MUSCULOSKELETAL: No clubbing. 2 + edema. No cyanosis on oxygen. NEUROLOGIC: Unresponsive to sternal rub. Unable to test cranial nerves.  PSYCHIATRIC: Unable to test secondary to unresponsive to sternal rub.   LABORATORY AND RADIOLOGICAL DATA: BNP 1530. PT, INR, and PTT normal range. Glucose 111, BUN 15, creatinine 1.0, sodium 136, potassium 4.4, chloride 99, CO2 of 35, calcium 8.6. Troponin borderline at 0.09. White blood cell count 6.0, H and H 13.9 and 43.9, platelet count of 194,000. Chest x-ray showed moderate bilateral central lung perihilar opacification with ill-defined heart border suggesting interstitial edema, more focal consolidation of the right perihilar region, cannot exclude infection. First ABG showed a pH of 7.20, pCO2 of 99, pO2 of 86, bicarbonate 7, that was on 32% oxygen, O2 saturation 95.6. Next ABG showed a pH of 7.32, pCO2 of 75, pO2 of 60, that was on 24% oxygen, bicarbonate 38.6, O2 saturation 88.4.  EKG, sinus rhythm, first-degree AV block, left atrial enlargement, right axis deviation, right ventricular hypertrophy.   ASSESSMENT AND PLAN:  1.  Acute respiratory failure with acidosis and acute encephalopathy. I spoke with Dr. Mayford KnifeWilliams, ER physician, to evaluate for intubation and he wanted to wait for another ABG. Even though the second ABG improved we were thinking about intubation since he was still unresponsive. When they were getting ready to intubate him he sat up and then decided he  needed to go to the bathroom. He was able to answer some yes and no questions at that time and it was thought that we would hold off on intubation at this point. We will continue BiPAP at this  point since pCO2 and pH are better and mental status better than when I saw him.  2.  Acute congestive heart failure, likely diastolic in nature with noncompliance with medications and accelerated hypertension. We will start nitro paste, metoprolol and enalapril, IV Lasix 60 mg IV b.i.d.  3.  Likely chronic obstructive pulmonary disease exacerbation with pCO2 very elevated, could also be obesity hyperventilation syndrome. We will start on steroids and nebulizer treatments.  4.  Accelerated hypertension. We will start on nitro paste, metoprolol, and enalapril and continue to monitor.  5.  Obesity with a body mass index of 48.7. This is morbid obesity, likely sleep apnea also, would benefit from Trilogy machine at home. We will have the care manager look into this.  6.  Elevated troponin likely secondary to acute respiratory failure. We will get serial troponins.  7.  The patient is high risk for intubation, high risk for cardiopulmonary arrest. We will watch closely in the ICU at this point in time.    TIME SPENT ON ADMISSION: 60 minutes in coordination of care with the ER physician.     ____________________________ Herschell Dimes. Renae Gloss, MD rjw:bu D: 11/16/2014 20:14:31 ET T: 11/16/2014 20:52:44 ET JOB#: [id number]  cc: Herschell Dimes. Renae Gloss, MD, <Dictator> Salley Scarlet MD ELECTRONICALLY SIGNED 11/25/2014 15:54

## 2015-03-27 NOTE — Consult Note (Signed)
Comments   I met with pt's sister. She says that RN's have told her that pt understands everything that is being said to him and that he is capable of making his own decisions. Sister has decided on trach and PEG. I called pt's brother in [place]. He says that sister told him that pt wants  trach and PEG so brother now agrees since this is what pt wants.  Kasa notified and will make appropriate consults.     Electronic Signatures: Glendora Clouatre, Izora Gala (MD)  (Signed 28-Dec-15 11:44)  Authored: Palliative Care   Last Updated: 28-Dec-15 11:44 by Beatriz Quintela, Izora Gala (MD)

## 2015-03-27 NOTE — Consult Note (Signed)
Pt seen and examined. Please see K. Arvilla MarketMills' notes. Pt on vent. Obese. Big abdomen, which could make G placement difficult. CXR pending in AM. Will consider PEG placement tomorrow or Wed AM? Will need to hold tube feedings and lovenox before then. Will follow. Thanks.  Electronic Signatures: Lutricia Feilh, Crosley Stejskal (MD)  (Signed on 28-Dec-15 17:23)  Authored  Last Updated: 28-Dec-15 17:23 by Lutricia Feilh, Osiris Charles (MD)

## 2015-03-28 NOTE — Discharge Summary (Signed)
PATIENT NAME:  Andrew Sparks, Andrew Sparks MR#:  [id number] DATE OF BIRTH:  May 24, 1966  DATE OF ADMISSION:  05/16/2012 DATE OF DISCHARGE:  05/17/2012  ADMITTING PHYSICIAN: Katha HammingSnehalatha Konidena, MD  DISCHARGING PHYSICIAN: Enid Baasadhika Andreia Gandolfi, MD  PRIMARY [place]IC[place].    CONSULTATIONS IN THE HOSPITAL: None.   DISCHARGE DIAGNOSES:  1. 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  2. Anxiety.  3. Tobacco use disorder.  4. Elevated troponin, likely demand ischemia.  5. Hyperlipidemia.   DISCHARGE MEDICATIONS:  1. Clonidine 0.1 mg p.o. b.i.d.  2. Celexa 20 mg p.o. daily.  3. Lisinopril/HCTZ 10/12.5 mg, 1 tablet p.o. daily.  4. Aspirin 81 mg p.o. daily.  5. Metoprolol 50 mg p.o. b.i.d.  6. Hydralazine 50 mg p.o. t.i.d.   DISCHARGE DIET: Low-sodium diet.   DISCHARGE ACTIVITY: As tolerated.    FOLLOW-UP INSTRUCTIONS: Primary care physician follow-up in 1 to 2 weeks.   LABORATORY, DIAGNOSTIC AND RADIOLOGICAL DATA:  White blood cell count 4.8, hemoglobin 13.8, hematocrit 41.8, platelet count 197.  [id number], potassium 3.5, chloride 101, bicarbonate 29, BUN 18, creatinine 0.83, glucose 97, calcium 8.8.  ALT 21, AST 24, alkaline phosphatase 64, total bilirubin 0.8, and albumin of 3.6.  Magnesium 1.9. Hemoglobin A1c 5.6. LDL cholesterol 99, HDL 38, total cholesterol [id number], serum triglycerides of 85.0.  Admission troponin 0.09. Last troponin is 0.06. INR is 1.1.  Chest x-ray done showing clear lung fields and no evidence of acute cardiopulmonary disease. Echocardiogram is pending at this time.   BRIEF HOSPITAL COURSE: Andrew Sparks is a 49 year old African American male with no significant past medical history other than hypertension, who is noncompliant with his followups and also medications, presented to his primary care physician for a routine visit and has been having some headaches as well. Blood pressure was pretty elevated, so he was sent to the ER. His blood pressure was  204/70 with a heart rate of 70 when he came in, with elevated troponin.   1. Malignant hypertension without any chest pain, headache or further hypertensive urgency changes: His blood pressure was controlled with starting back on a regimen of lisinopril, HCTZ, clonidine, hydralazine, and metoprolol. He was advised to stay compliant with his medications, otherwise the risks were explained with high blood pressure. He was asymptomatic and is being discharged home on the above-mentioned medications. He was also advised to take a baby aspirin every day if no problems with reflux or gastric ulcers. His LDL is within normal limits, less than 100, so I advised dietary management at this time. Too many prescription medications could cause noncompliance, so I will avoid statin at this time.  2. Anxiety: He complains of anxiety and panic attacks, so  Xanax p.r.n., started him on Celexa to see if that would help him with the generalized anxiety.  3. Elevated troponin: Likely from demand ischemia from hypertension, not NSTEMI, and they trended down and the patient is asymptomatic at this time.   His course has been otherwise uneventful in the hospital.   DISCHARGE CONDITION: Stable.   DISCHARGE DISPOSITION: Home.   TIME SPENT ON DISCHARGE:  45 minutes.  ____________________________ Enid Baasadhika Kirby Argueta, MD rk:cbb D: 05/17/2012 15:13:56 ET T: 05/18/2012 16:20:14 ET JOB#: [id number] cc: Enid Baasadhika Zeah Germano, MD, <Dictator> Enid BaasADHIKA Damontay Alred MD ELECTRONICALLY SIGNED 06/04/2012 11:06

## 2015-03-28 NOTE — H&P (Signed)
PATIENT NAME:  Andrew Sparks, Andrew Sparks MR#:  [id number] DATE OF BIRTH:  [birth year]  DATE OF ADMISSION:  05/16/2012  PRIMARY CARE PHYSICIAN: None.   ER PHYSICIAN: Joseph ArtAlice Finnell, MD  CHIEF COMPLAINT: Chest pain.   HISTORY OF PRESENT ILLNESS: The patient is a 49 year old male with history of hypertension and also history of hyperlipidemia and noncompliance who came in because the patient went to see his primary doctor in [place] and was sent here because his blood pressure was high. The patient actually is in the process of [facility]. The patient went to see a doctor in [place], and he was sent in here because the patient's blood pressure was elevated. In the ER, when I saw the patient, his blood pressure was 204/70 and heart rate 70.  The patient denies chest pain, no trouble breathing, no nausea, and no vomiting. He is supposed to be on medications for blood pressure and also cholesterol but not taking it. The last time he was here was in 2009. At that time, he was discharged with multiple medications, but he says he never took them. The patient has some chest tightness for two days, but denies any chest pain now. We are asked to admit for chest pain, but he says I do not have any chest pain and no other complaints and he is very eager to go home, but his blood pressure is high.   ALLERGIES: No known drug allergies.   SOCIAL HISTORY: He still smokes one pack per day. Occasional alcohol. No drugs.   FAMILY HISTORY: Father's side has diabetes and hypertension.   SOCIAL HISTORY: He is still working. The patient still smokes. He does drink alcohol only on weekends. No drugs.   MEDICATIONS: He is supposed to be on lisinopril, simvastatin, metoprolol, HCTZ, and aspirin. The patient is not taking them.   REVIEW OF SYSTEMS: No fever, no chills, no nausea, no vomiting, no chest pain, no tinnitus, no ear pain, no difficulty swallowing, and no trouble breathing. The patient does  have some cough. No nausea, no vomiting, no abdominal pain, no dysuria, no diabetes, no easy bruising, no skin rashes, no joint pain, and no history of stroke.   PHYSICAL EXAMINATION:   GENERAL: A 49 year old obese male, not in distress, answering questions appropriately.   HEAD: Head atraumatic, normocephalic.   EYES: Pupils equally reacting to light. Extraocular movements are intact.   ENT: No tympanic membrane congestion. No turbinate hypertrophy. No oropharyngeal erythema.   NECK: Normal range of motion. No JVD. No pursue.   CARDIOVASCULAR: S1 and S2 regular. No murmurs.   PULMONARY: Lungs are clear to auscultation. No wheeze. No rales.   ABDOMEN: Soft, nontender, and nondistended. Bowel sounds present.   EXTREMITIES: No extremity edema. No cyanosis. No clubbing.   MUSCULOSKELETAL: As I mentioned.   SKIN: No skin rashes.   NEUROLOGIC: Cranial nerves II through XII intact. Power 5/5 in upper and lower extremities. Sensation intact. Deep tendon reflexes 2+ bilaterally.  LABORATORY AND DIAGNOSTICS: WBC 4.9, hemoglobin 13.9, hematocrit 41.4, and platelets 195.   Electrolytes: Sodium 139, potassium 3.9, chloride 102, bicarbonate 28, BUN 17, creatinine 0.94, and glucose 124. Troponin 0.09. CK total 371. CPK-MB 5.1. INR 1.1.   EKG showed normal sinus rhythm with no ST-T changes, some PVCs are present, rate 85 beats per minute.   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 The patient is going to be admitted to observation status. We are  going to give him metoprolol 50 mg every 12 hours, lisinopril 10 mg daily, and HCTZ 25 mg daily along with aspirin and also nitroglycerin. The patient has slightly elevated troponin, CK and CPK-MB likely secondary to uncontrolled hypertension. Continue to monitor cardiac markers and also continue aspirin and beta blockers and statins and check fasting lipase also and use p.r.n. hydralazine for blood  pressure and also start him on statins. The patient is advised to quit smoking. I counseled for 3 minutes. Lung sounds are clear at this time. No need for antibiotics. The patient will be followed for blood pressure and readjust the medications.   TIME SPENT ON HISTORY AND PHYSICAL: About 60 minutes. ____________________________ Katha Hamming, MD sk:slb D: 05/16/2012 16:05:42 ET T: 05/16/2012 17:33:21 ET JOB#: [id number]  cc: Katha Hamming, MD, <Dictator> Katha Hamming MD ELECTRONICALLY SIGNED 05/28/2012 0:03

## 2015-03-31 NOTE — Consult Note (Signed)
PATIENT NAME:  Andrew Sparks, Andrew Sparks MR#:  [id number] DATE OF BIRTH:  07/05/1966  DATE OF CONSULTATION:  11/30/2014  CONSULTING PHYSICIAN:  Cammy CopaPaul H. Emma Birchler, MD  REASON FOR CONSULTATION: Possible tracheostomy.   HISTORY OF PRESENT ILLNESS: The patient is a 49 year old, African-American male who was admitted to the hospital 14 days ago with acute respiratory distress. He was put on BiPAP, but failed BiPAP and eventually needed intubation. He has several medical issues including morbid obesity and obstructive sleep apnea that is not being controlled. He has some congestive heart failure as well and COPD from long-term smoking. He self-extubated himself 4 days later, but failed that and had to be reintubated on December 17. He is now 11 days post intubation. He has failed weaning from the vent and will need a tracheostomy for further care. The family has been very undecided what to do, but had a family meeting today and apparently, decided to proceed with a tracheostomy tube and PEG tube. Assessment is made now for assessing the possibilities of timing to get that done.   PAST MEDICAL HISTORY: Significant for hypertension, obesity, anxiety, tobacco abuse, hyperlipidemia and obstructive sleep apnea not well controlled.   PAST SURGICAL HISTORY: Unknown.   CURRENT MEDICATIONS: Are as listed in the chart and reviewed.   DRUG ALLERGIES: None.   SOCIAL HISTORY: He smokes a pack a day and has for many years. Occasional alcohol. No known drug use.   FAMILY HISTORY: There is diabetes and hypertension on the father's side.  PHYSICAL EXAMINATION:  GENERAL: The patient is ICU Room 6. He is not responsive other than to noxious stimuli, withdraws slightly. He is sedated. He has an orotracheal tube in place. He also has a orogastric feeding tube in place as well.   NECK: Shows no scars or signs of previous surgery. His neck is not as obese as the rest of his body. He has reasonable landmarks on his neck. There is no  swelling or bruising here or signs of thyroid enlargement.   IMPRESSION AND PLAN: The patient has prolonged intubation and failure to wean. He needs a tracheostomy tube placed to sustain life long-term. He is a good candidate for this and should not have any major challenges with the surgery itself. He is still medically unstable and needs to be followed in the intensive care unit. I will check the surgical schedule and see when we can get him on the schedule. It most likely will be done on Monday morning next week because of holiday and difficulty getting cases done prior to that. We will obtain written consent from the family and make sure that are still wanting to proceed with tracheostomy.    ____________________________ Cammy CopaPaul H. Aiden Rao, MD phj:TT D: 11/30/2014 17:54:01 ET T: 11/30/2014 18:35:58 ET JOB#: [id number]  cc: Cammy CopaPaul H. Marybella Ethier, MD, <Dictator> Cammy CopaPAUL H Garnette Greb MD ELECTRONICALLY SIGNED 12/21/2014 9:01

## 2015-04-01 ENCOUNTER — Telehealth (HOSPITAL_COMMUNITY): Payer: Self-pay | Admitting: Diagnostic Radiology

## 2015-04-01 NOTE — Telephone Encounter (Signed)
Called pt's sister to schedule PEG removal for patient. She states that she is "[place] [place]." She says she will call the [facility] who ordered the removal and tell them it will have to be [facility]. JM

## 2015-04-04 NOTE — Consult Note (Signed)
PATIENT NAME:  Andrew Sparks, Andrew Sparks MR#:  [id number] DATE OF BIRTH:  04-14-1966  DATE OF CONSULTATION:  12/14/2014  REFERRING PHYSICIAN:   CONSULTING PHYSICIAN:  Cammy Copa, MD  REASON FOR CONSULTATION: To evaluate the tracheostomy tube for downsizing and possible speaking valve.   HISTORY OF PRESENT ILLNESS: The patient is a 49 year old African-American male with a history of hypertension, severe obstructive sleep apnea, obesity, tobacco abuse, who was brought to the Emergency Room with shortness of breath, hypercarbic respiratory distress. He required intubation.  He self extubated the next day, had to be reintubated again.  He ended up with a tracheostomy tube and PEG tube. He is scheduled to be sent to a skilled nursing facility tomorrow for rehabilitation and assessment is made of his airway today because of inability to vocalize with the current tracheostomy tube in place. He has a number 8 DCT tube right now.   PAST MEDICAL HISTORY: Significant for hypertension, medical noncompliance, history of CVA, hyperlipidemia, obesity, severe obstructive sleep noncompliant with BiPAP at home.   SOCIAL HISTORY: Positive for tobacco and alcohol. He has recently lived in a homeless shelter.   PHYSICAL EXAMINATION:  The patient is alert and cooperative. He is mouthing words. He cannot get much air flow out his mouth.  His nose is open and clear. Oropharynx clear. His neck is negative for nodes or masses.  His tracheostomy tube is number 8 DCT, is sitting in a good position in his neck.  There are still some sutures attached to the skin.   The sutures were removed. A fresh number 6 Shiley DCT tube was readied. The cuff was checked, deflated, and lubricated. The trach tube was changed out without difficulty.  He is breathing well through this without difficulty.  It was secured around the neck. Tracheostomy dressing was applied. The cuff was left deflated. He is able to put his finger over the hole and now  vocalize and is able to communicate some now.   The flexible scope was passed through his left nostril to visualize the hypopharynx and larynx. This is dictated in detail elsewhere. This shows his nose is open, the nasopharynx is clear, there is redundant tissue in the hypopharynx.  He has got minimal swelling around the epiglottis and laryngeal inlet. False cords look normal. True cords close to the midline. The right cord moves briskly, the left cord is a little more sluggish.  I do not see any lesions on the cords themselves.  They do not open wide enough right now for me to see the subglottic area well. There are no lesions noted in the larynx or hypopharynx. He has got some saliva that sits in the post arytenoid area and tends to run down. He has decreased sensation of his larynx currently.   IMPRESSION: The patient has had a tracheostomy because of obstructive sleep apnea and vent dependence. He will probably need to keep this tracheostomy for a while because of his noncompliance with CPAP.  The number 6 is probably a good size for him and it leaves enough room to speak around this. Speech therapy is to see him tomorrow morning and get him a speaking valve that he can use during the daytime so he can vocalize well, but then he needs to make sure he always takes that off at night time to be able to breathing through the tracheostomy tube at night. Once he loses more weight and is more compliant then he likely would be a candidate to  consider downsizing further and capping, but he would probably have to go on to BiPAP again if he is going to be capped or decannulated. He is scheduled to be discharged to a rehabilitation center tomorrow and can go with the speaking valve to be used during the daytime and taken off at night time.     ____________________________ Cammy CopaPaul H. Berenice Oehlert, MD phj:bu D: 12/14/2014 19:55:00 ET T: 12/14/2014 20:08:02 ET JOB#: [id number]  cc: Cammy CopaPaul H. Deshawna Mcneece, MD, <Dictator> Cammy CopaPAUL H  Zoltan Genest MD ELECTRONICALLY SIGNED 12/21/2014 9:01

## 2015-04-04 NOTE — Discharge Summary (Signed)
PATIENT NAME:  Andrew Sparks, Andrew Sparks MR#:  [id number] DATE OF BIRTH:  01-02-1966  DATE OF ADMISSION:  11/16/2014 DATE OF DISCHARGE:  12/15/2014  DISPOSITION: Acute inpatient rehab.  PRESENTING COMPLAINT: Unresponsiveness.   This is the final discharge summary. Please look at previous interim discharge summaries done by Dr. Amado CoeGouru and Dr. Winona LegatoVaickute. My hospital course will cover dates January 9th through January 12th.   PROCEDURES: On 11th January, flexible laryngoscope and tracheostomy tube changed to Shiley #6. This was done by Dr. Elenore RotaJuengel, ENT.  FINAL DISCHARGE DIAGNOSES: 1.  Acute hypercarbic hypoxic respiratory failure with acidosis and acute encephalopathy due to acute diastolic congestive heart failure with chronic obstructive pulmonary disease exacerbation and severe sleep apnea, intubated/extubated, and now on trach collar, Doing well.  2.  Ileus. Resolved.  3.  Acute strep pneumonia. Finished course of amoxicillin.  4.  Acute diastolic congestive heart failure, resolved.  5.  Severe sleep apnea.  6.  Accelerated hypertension.  7.  Morbid obesity.  8.  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  9.  PEG tube feeding per dietician recommendations.   DISCHARGE MEDICATIONS: 1.  Warfarin 7.5 mg q. 11:00 a.m. 2.  Alprazolam 0.25 mg b.i.d.  3.  Potassium chloride 20 mEq p.o. daily.  4.  Celexa 20 mg daily.  5.  Metoprolol 50 mg t.i.d.  6.  Oxycodone 5 mg q. 6 hours.  7.  Scopolamine patch 1 topical q. 3 days. 8.  Zestril 40 mg b.i.d.  9.  Hydralazine 100 mg t.i.d.  10.  Catapres 0.1 mg t.i.d. q. 8 hours. 11.  Tylenol 650 q. 4 p.r.n.  12.  Nitro-Bid 2% ointment 1 inch topical b.i.d.  13.  Pepcid 20 mg b.i.d.  14.  Lasix 40 mg daily.   DISCHARGE INSTRUCTIONS: The patient is currently going to [facility], acute inpatient rehab. He will need outpatient follow up with primary care physician, which will be [facility] once he is ready for discharge.   CODE STATUS:  FULL code.   BRIEF SUMMARY OF HOSPITAL COURSE: Andrew Sparks is a 49 year old obese, African American gentleman who was admitted on 14th December with:  1.  Acute hypercarbic hypoxic respiratory failure with acidosis, acute encephalopathy due to acute diastolic congestive heart failure, COPD exacerbation, and severe obstructive sleep apnea. The patient was intubated 11/16/2014. He self-extubated. He was re-intubated and underwent trach by Dr. Elenore RotaJuengel on 12/07/2014. His trache[facility] was [facility] on January 11th. The patient is doing well. He worked with speech therapy.  2.  Ileus, resolved, non-obstructed. Reglan was given. Electrolytes are stable.  3.  Chronic dysphagia secondary to status post tracheostomy. The patient is working with speech therapy. He will need speaking valve down the road. He is getting PEG tube feeding, which are bolus feedings. Follow dietary recommendations.  4.  Acute strep pneumonia. Sputum was positive. Completed a course of Augmentin recently. No fever. Leukocytosis is resolved.  5.  Acute diastolic congestive heart failure due to noncompliance of medications, accelerated hypertension and severe obstructive sleep apnea. Blood pressure is still a bit on the higher side; however, he is on nitro paste, clonidine, hydralazine, metoprolol, lisinopril and Lasix.  6.  Severe obstructive sleep apnea. Will need long-term trach for now.  7.  Accelerated hypertension. Continue above BP meds.  8.  Right brachial deep venous thrombosis. The patient was initially on Lovenox, was discontinued, and he is currently on Coumadin.   The patient did have  a prolonged hospital stay. However, he is recovered. He will go to acute inpatient at [facility].   DIAGNOSTIC DATA: His most recent labs: PT/INR is 21.3 and 1.9. His white count is 6.7, H and H 12.8 and 40.7, and platelet count 290,000. UA is negative for UTI. His basic metabolic panel is within normal limits.   TIME SPENT: 40  minutes.   ____________________________ Wylie Hail Allena Katz, MD sap:sb D: 12/15/2014 09:26:49 ET T: 12/15/2014 09:46:35 ET JOB#: [id number]  cc: Lourdes Kucharski A. Allena Katz, MD, <Dictator> Willow Ora MD ELECTRONICALLY SIGNED 12/17/2014 11:22

## 2015-04-04 NOTE — Op Note (Signed)
PATIENT NAME:  Andrew Sparks, Andrew Sparks MR#:  [id number] DATE OF BIRTH:  07-07-1966  DATE OF PROCEDURE:  12/07/2014  PREOPERATIVE DIAGNOSIS: Prolonged intubation with failure to wean.  POSTOPERATIVE DIAGNOSIS: Prolonged intubation with failure to wean.    PROCEDURE: Tracheostomy.   SURGEON: Cammy CopaPaul H. Thierry Dobosz, MD   ANESTHESIA: General.   COMPLICATIONS: None.   DESCRIPTION OF PROCEDURE: The patient was given general anesthesia through the oroendotracheal tube that has been there long term. Once the patient was asleep, shoulder roll was placed, his neck was extended some. The area was prepped with alcohol and cleaned and then a mark was made in the low anterior skin crease about 2 fingerbreadths above the sternal notch. 4 mL of 1% Xylocaine with epinephrine 1:100,000 were infiltrated. He was prepped and draped in a sterile fashion.   An incision was created in the skin and carried down through subcutaneous tissue. Some of the extra subcutaneous fat was removed right from the area. The strap muscles were divided in the midline and there was a very large external jugular vein that went right down the middle of the neck. This was cut and tied with 2-0 silks on both sides superiorly and inferiorly. The thyroid tissue was clearly evident, as it had an isthmus just below the cricoid ring. Blunt dissection was carried beneath this and the Harmonic scalpel was used to cut through the thyroid in the midline and divide it to show the trachea. The pretracheal fascia was freed up to see several rings of the trachea. Trach hook was placed under the cricoid ring to pull it up into view easier.   The inspired oxygen level was at 30%. An incision was made between the second and third tracheal rings and then, using curved Mayo scissors, a lateral incision was made in the third tracheal ring to create an upside down U-shaped flap. A 4-0 Vicryl was used to suture this to the anterior tissues. A tracheal dilator was used to widen  this. The trach cuff was right underneath it and was left intact. The oroendotracheal tube was freed up from its attachments and then the cuff was deflated and it was pulled back under direct vision in the trachea. Once it was above the tracheal hole, I placed a #8 Shiley DCT tube. This went in. The cuff was inflated. Oxygenation was good. The orotracheal tube was removed completely.   The trach tube was secured to the skin with 2-0 nylon sutures and then trach ties were placed around the neck. Trach dressing was placed.   TOTAL ESTIMATED BLOOD LOSS: Minimal.   The patient tolerated the procedure well. There were no operative complications.    ____________________________ Cammy CopaPaul H. Annalynne Ibanez, MD phj:ah D: 12/07/2014 09:31:52 ET T: 12/07/2014 10:09:16 ET JOB#: [id number]  cc: Cammy CopaPaul H. Prairie Stenberg, MD, <Dictator> Cammy CopaPAUL H Ilo Beamon MD ELECTRONICALLY SIGNED 12/21/2014 9:01

## 2015-04-04 NOTE — Op Note (Signed)
PATIENT NAME:  Andrew Sparks, Andrew Sparks MR#:  [id number] DATE OF BIRTH:  26-Nov-1966  DATE OF PROCEDURE:  12/14/2014  This is a reconsultation.  PREOPERATIVE DIAGNOSES:  1.  Status post tracheostomy.  2.  Difficulty with air flow.   POSTOPERATIVE DIAGNOSES:  1.  Status post tracheostomy.  2.  Good vocal cord mobility in an open larynx.   OPERATIVE PROCEDURE: 1.  Flexible laryngoscopy.  2.  Tracheostomy tube change.   SURGEON: Cammy CopaPaul H. Eriana Suliman, MD   ANESTHESIA: Topical.   COMPLICATIONS: None.   TOTAL ESTIMATED BLOOD LOSS: None.   DESCRIPTION OF PROCEDURE: The patient was given topical anesthesia by spraying the nose with 4% Xylocaine mixed with Afrin. The flexible scope was passed through the left nostril to visualize the hypopharynx and larynx. The nose is clear. The nasopharynx is clear as well. Hypopharynx has lots of redundant tissue. I can see some saliva around the laryngeal inlet. There is minimal swelling around the larynx and the laryngeal inlet. The false cords are clear. There is no sign of lesions here. No exudate. The true cords move well. The right side seems to be moving very rapidly. The left side is a little more sluggish but it closes, seems close to the midline, closes off completely. He does not open up enough for me to see his subglottic space. I do not see any lesions in the larynx or hypopharynx, but he does show some saliva that tends to slide through the interarytenoid area and down into the larynx. He can put his finger over his tracheostomy hole after changing the tracheostomy tube and vocalize.   The patient had a #8 DCT tube in. This was still sutured to the neck. Sutures were removed. A fresh #6 Shiley DCT tube was readied. The cuff was checked, deflated, and lubricated. The tracheostomy tube was changed out without difficulty. It was secured around the neck. The cuff was left deflated. The patient was able to take a breath in, put his finger over the hole, and vocalize.  He is having just a lot of clear secretions. He has decreased sensation in his larynx and aspirating some saliva.   The patient tolerated the procedure well. This was done at the bedside. There were no operative complications.    ____________________________ Cammy CopaPaul H. Alvon Nygaard, MD phj:ST D: 12/14/2014 19:48:41 ET T: 12/14/2014 23:06:35 ET JOB#: [id number]  cc: Cammy CopaPaul H. Sua Spadafora, MD, <Dictator> Cammy CopaPAUL H Christos Mixson MD ELECTRONICALLY SIGNED 12/21/2014 9:01

## 2015-04-21 ENCOUNTER — Ambulatory Visit: Payer: Self-pay

## 2015-06-23 ENCOUNTER — Other Ambulatory Visit: Payer: Self-pay

## 2015-06-30 ENCOUNTER — Ambulatory Visit: Payer: Self-pay | Admitting: Internal Medicine

## 2015-06-30 ENCOUNTER — Ambulatory Visit: Payer: Self-pay

## 2015-07-07 ENCOUNTER — Ambulatory Visit: Payer: Self-pay | Admitting: Internal Medicine

## 2015-07-27 ENCOUNTER — Ambulatory Visit: Payer: Self-pay

## 2015-08-12 ENCOUNTER — Other Ambulatory Visit: Payer: Self-pay | Admitting: Pediatrics

## 2015-08-25 ENCOUNTER — Other Ambulatory Visit: Payer: Self-pay | Admitting: Pediatrics

## 2015-08-25 DIAGNOSIS — I509 Heart failure, unspecified: Secondary | ICD-10-CM

## 2015-09-09 ENCOUNTER — Ambulatory Visit: Payer: Disability Insurance | Attending: Pediatrics

## 2015-09-09 DIAGNOSIS — I509 Heart failure, unspecified: Secondary | ICD-10-CM | POA: Diagnosis not present

## 2015-09-09 DIAGNOSIS — G4730 Sleep apnea, unspecified: Secondary | ICD-10-CM | POA: Insufficient documentation

## 2015-09-09 DIAGNOSIS — F329 Major depressive disorder, single episode, unspecified: Secondary | ICD-10-CM | POA: Insufficient documentation

## 2015-09-09 DIAGNOSIS — F319 Bipolar disorder, unspecified: Secondary | ICD-10-CM | POA: Insufficient documentation

## 2015-10-15 IMAGING — CR DG CHEST 1V PORT
1 series · 1 of 1 positions shown · non-contrast
Comparison: 11/18/2014

CLINICAL DATA: Evaluate ET tube placement

EXAM:
PORTABLE CHEST - 1 VIEW

[ap]
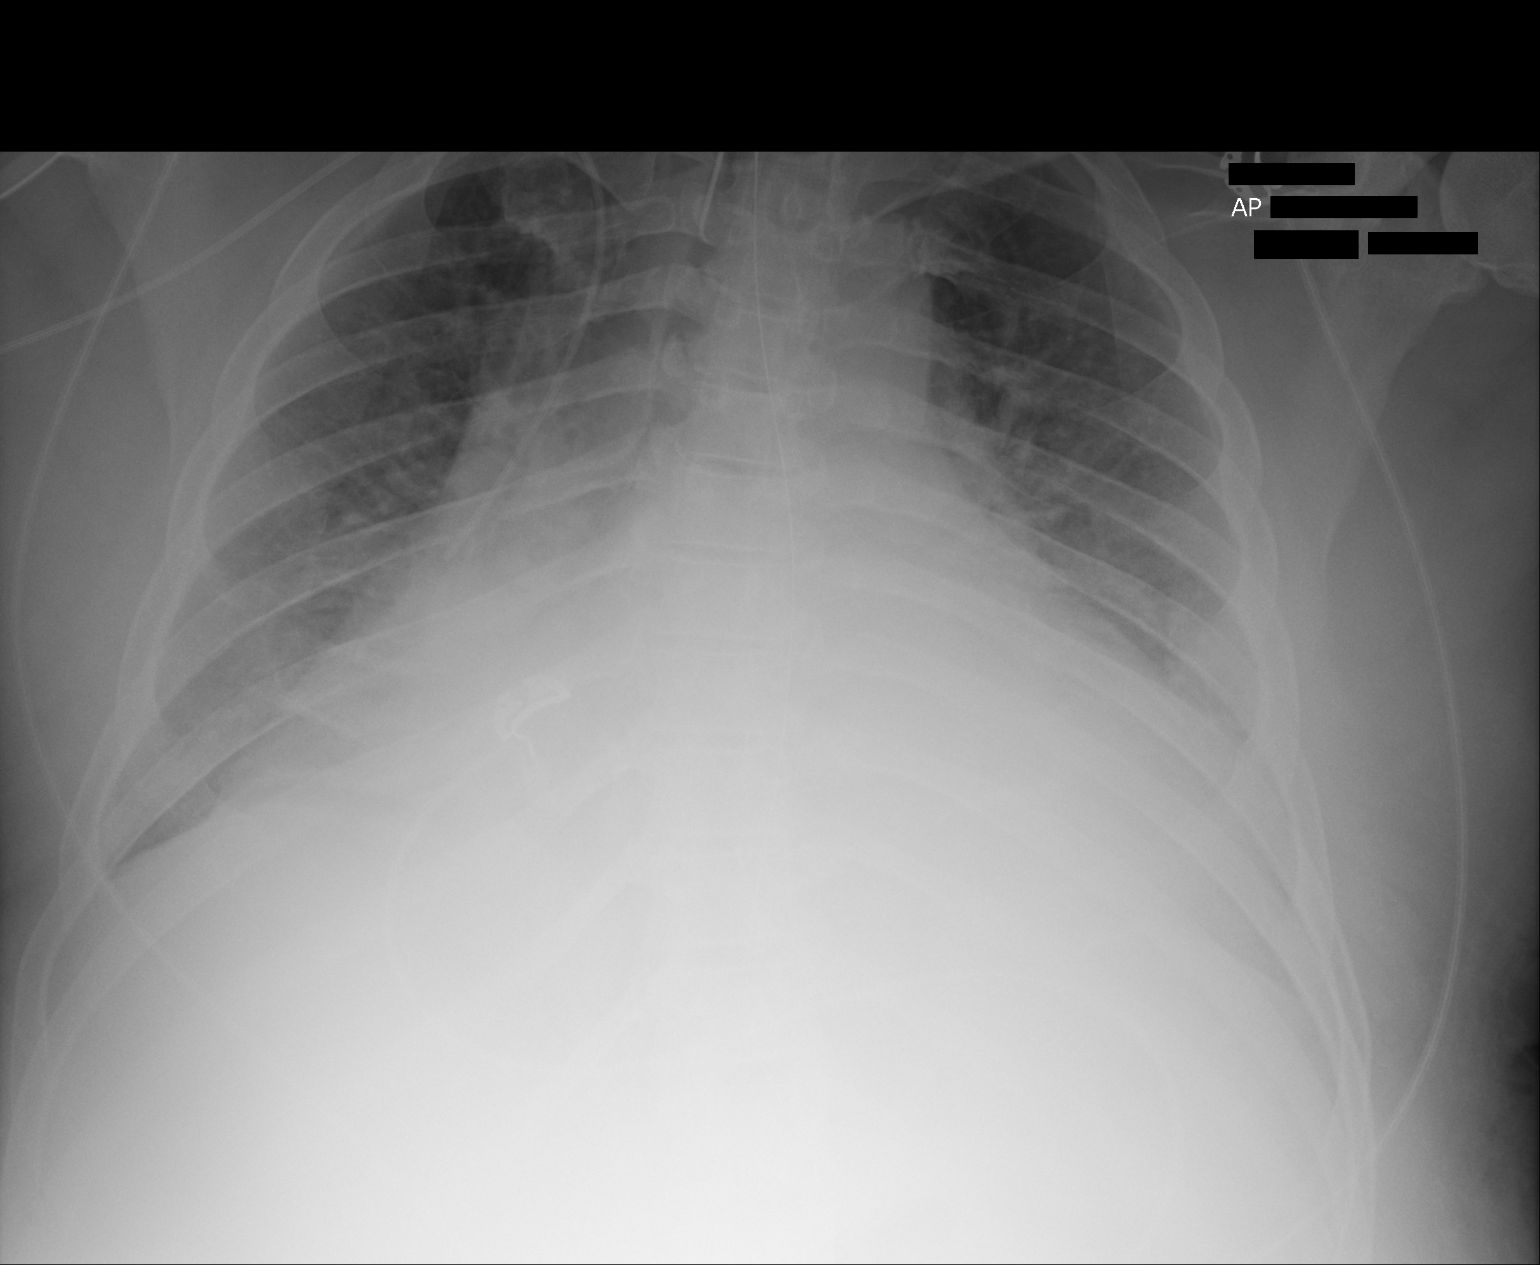

[1 of 1 positions shown; findings below may reference images not displayed]

FINDINGS: The endotracheal tube tip is above the carina. There is a right arm
PICC line with tip in the cavoatrial junction. Nasogastric tube is
identified. Similar appearance of cardiac enlargement, pleural
effusions and interstitial edema. No airspace consolidation.
IMPRESSION: 1. Satisfactory position of ET tube with tip above the carina.
2. No change in CHF pattern.

## 2015-10-27 IMAGING — CR DG CHEST 1V PORT
1 series · 1 of 1 positions shown · non-contrast
Comparison: 11/27/2014

CLINICAL DATA: CHF and respiratory failure

EXAM:
PORTABLE CHEST - 1 VIEW

[ap]
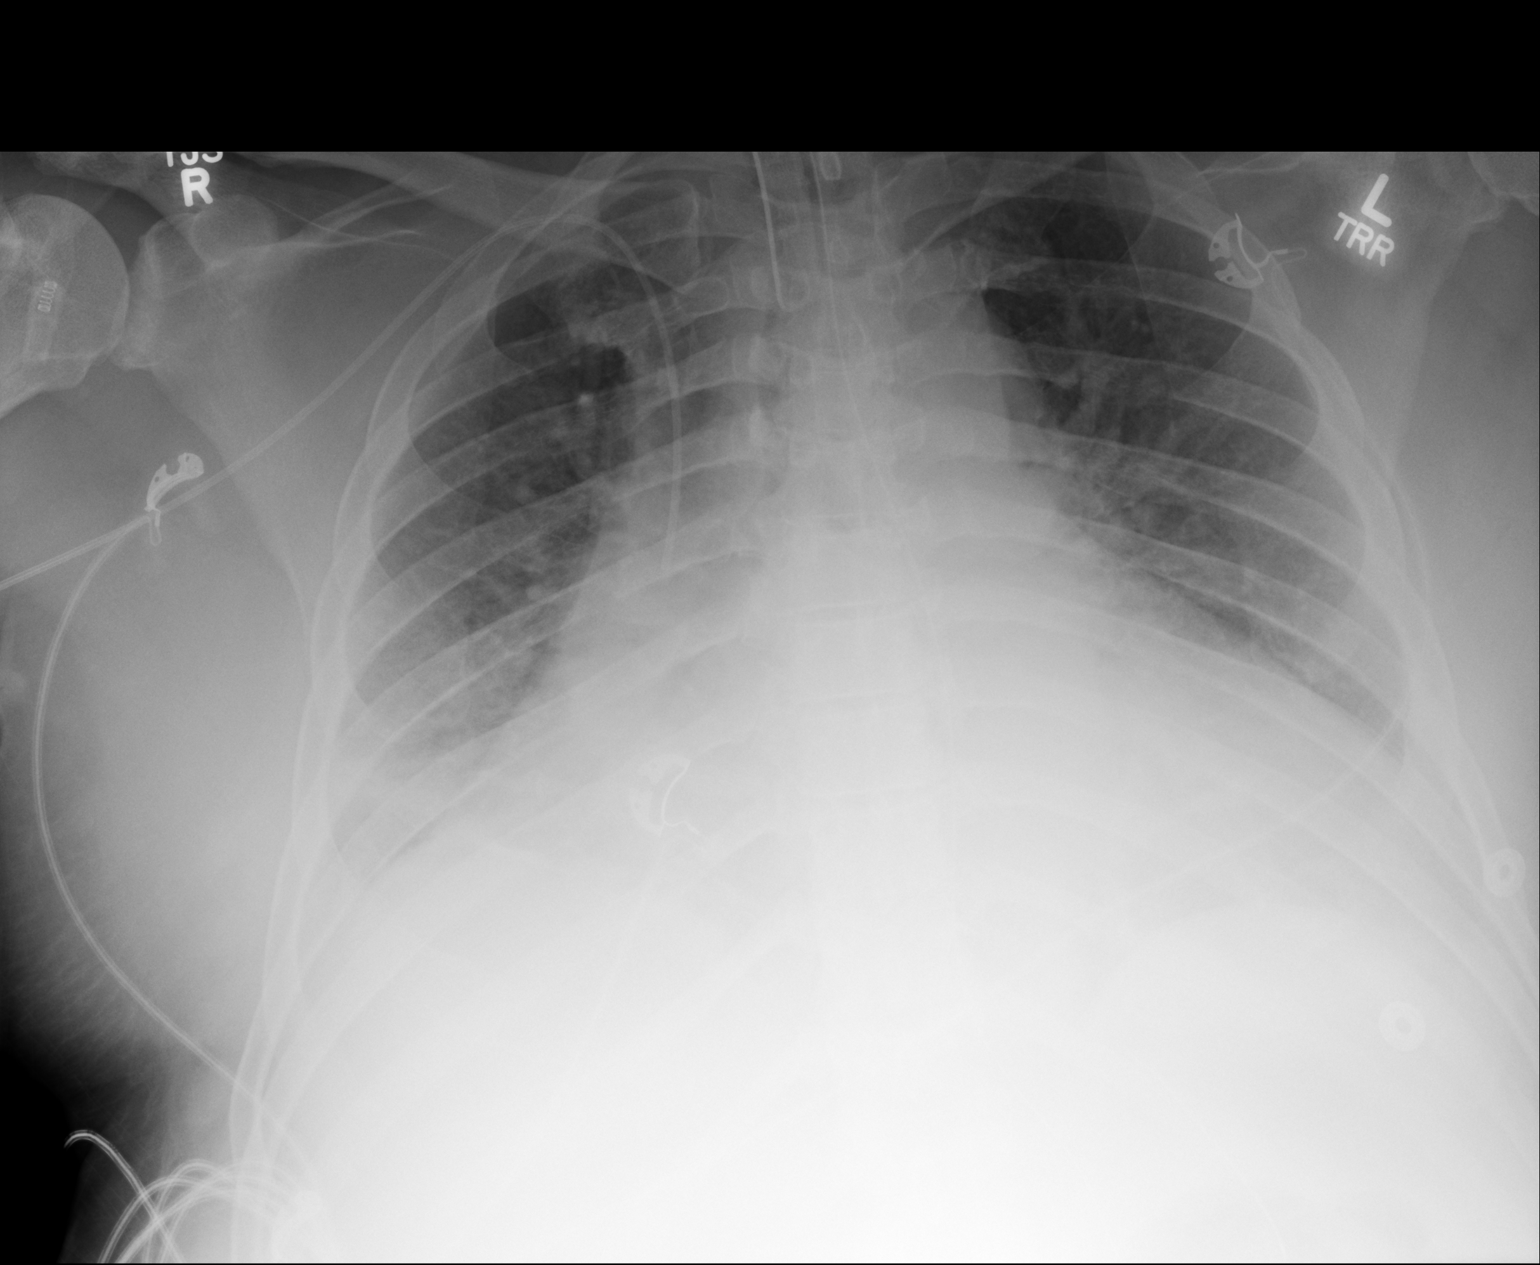

[1 of 1 positions shown; findings below may reference images not displayed]

FINDINGS: Endotracheal tube tip approximately 4.5 cm above the carina. PICC
line positioning is stable. Lungs show slightly improved aeration
bilaterally with less prominent edema. Bibasilar atelectasis
remains. Significant cardiomegaly appears stable. No pneumothorax or
significant pleural fluid.
IMPRESSION: Improved aeration bilaterally with decrease in edema pattern.

## 2016-02-01 DIAGNOSIS — G4733 Obstructive sleep apnea (adult) (pediatric): Secondary | ICD-10-CM | POA: Insufficient documentation

## 2016-10-19 ENCOUNTER — Telehealth: Payer: Self-pay

## 2016-10-19 NOTE — Telephone Encounter (Signed)
Lmov for patient to call back and schedule sleep consults with Pulmonary  Notes in pink files.

## 2016-10-24 NOTE — Telephone Encounter (Signed)
Attempted to schedule appt.  No ans no vm.   

## 2016-10-25 NOTE — Telephone Encounter (Signed)
lmov to call office.  Faxed back referral to [facility] with Unable to schedule cannot contact patient msg.

## 2016-10-25 NOTE — Telephone Encounter (Signed)
Correction :   Referral from [facility].  Mailed letter to patient and routed to pcp .

## 2016-11-09 ENCOUNTER — Institutional Professional Consult (permissible substitution): Payer: Disability Insurance | Admitting: Pulmonary Disease

## 2016-11-13 ENCOUNTER — Institutional Professional Consult (permissible substitution): Payer: Disability Insurance | Admitting: Internal Medicine

## 2016-12-07 ENCOUNTER — Institutional Professional Consult (permissible substitution): Payer: Disability Insurance | Admitting: Internal Medicine

## 2017-01-04 ENCOUNTER — Institutional Professional Consult (permissible substitution): Payer: Disability Insurance | Admitting: Internal Medicine

## 2017-01-16 ENCOUNTER — Emergency Department: Payer: Medicaid Other

## 2017-01-16 ENCOUNTER — Observation Stay
Admission: EM | Admit: 2017-01-16 | Discharge: 2017-01-18 | Disposition: A | Discharge status: Home / Self Care | Payer: Medicaid Other | Attending: Internal Medicine | Admitting: Internal Medicine

## 2017-01-16 ENCOUNTER — Encounter: Payer: Self-pay | Admitting: Emergency Medicine

## 2017-01-16 DIAGNOSIS — Z6841 Body Mass Index (BMI) 40.0 and over, adult: Secondary | ICD-10-CM | POA: Diagnosis not present

## 2017-01-16 DIAGNOSIS — F17210 Nicotine dependence, cigarettes, uncomplicated: Secondary | ICD-10-CM | POA: Diagnosis not present

## 2017-01-16 DIAGNOSIS — I10 Essential (primary) hypertension: Secondary | ICD-10-CM | POA: Insufficient documentation

## 2017-01-16 DIAGNOSIS — Z7901 Long term (current) use of anticoagulants: Secondary | ICD-10-CM | POA: Diagnosis not present

## 2017-01-16 DIAGNOSIS — E662 Morbid (severe) obesity with alveolar hypoventilation: Secondary | ICD-10-CM | POA: Insufficient documentation

## 2017-01-16 DIAGNOSIS — Z7982 Long term (current) use of aspirin: Secondary | ICD-10-CM | POA: Insufficient documentation

## 2017-01-16 DIAGNOSIS — Z930 Tracheostomy status: Secondary | ICD-10-CM | POA: Diagnosis not present

## 2017-01-16 DIAGNOSIS — Z8673 Personal history of transient ischemic attack (TIA), and cerebral infarction without residual deficits: Secondary | ICD-10-CM | POA: Insufficient documentation

## 2017-01-16 DIAGNOSIS — Z79899 Other long term (current) drug therapy: Secondary | ICD-10-CM | POA: Insufficient documentation

## 2017-01-16 DIAGNOSIS — R079 Chest pain, unspecified: Secondary | ICD-10-CM | POA: Diagnosis present

## 2017-01-16 DIAGNOSIS — I472 Ventricular tachycardia, unspecified: Secondary | ICD-10-CM

## 2017-01-16 DIAGNOSIS — F410 Panic disorder [episodic paroxysmal anxiety] without agoraphobia: Secondary | ICD-10-CM | POA: Diagnosis not present

## 2017-01-16 DIAGNOSIS — I252 Old myocardial infarction: Secondary | ICD-10-CM | POA: Insufficient documentation

## 2017-01-16 DIAGNOSIS — R0789 Other chest pain: Secondary | ICD-10-CM | POA: Insufficient documentation

## 2017-01-16 DIAGNOSIS — F331 Major depressive disorder, recurrent, moderate: Secondary | ICD-10-CM | POA: Diagnosis not present

## 2017-01-16 HISTORY — DX: Non-ST elevation (NSTEMI) myocardial infarction: I21.4

## 2017-01-16 LAB — CBC
HCT: 33.7 % — ABNORMAL LOW (ref 40.0–52.0)
Hemoglobin: 11.6 g/dL — ABNORMAL LOW (ref 13.0–18.0)
MCH: 31.2 pg (ref 26.0–34.0)
MCHC: 34.5 g/dL (ref 32.0–36.0)
MCV: 90.3 fL (ref 80.0–100.0)
Platelets: 187 10*3/uL (ref 150–440)
RBC: 3.73 MIL/uL — ABNORMAL LOW (ref 4.40–5.90)
RDW: 14.2 % (ref 11.5–14.5)
WBC: 6.1 10*3/uL (ref 3.8–10.6)

## 2017-01-16 LAB — BASIC METABOLIC PANEL
Anion gap: 8 (ref 5–15)
BUN: 18 mg/dL (ref 6–20)
CALCIUM: 8.9 mg/dL (ref 8.9–10.3)
CHLORIDE: 100 mmol/L — AB (ref 101–111)
CO2: 29 mmol/L (ref 22–32)
CREATININE: 1.05 mg/dL (ref 0.61–1.24)
GFR calc Af Amer: >60 mL/min (ref >60)
GFR calc non Af Amer: >60 mL/min (ref >60)
Glucose, Bld: 126 mg/dL — ABNORMAL HIGH (ref 65–99)
Potassium: 3.5 mmol/L (ref 3.5–5.1)
SODIUM: 137 mmol/L (ref 135–145)

## 2017-01-16 LAB — TROPONIN I: Troponin I: <0.03 ng/mL (ref <0.03)

## 2017-01-16 NOTE — ED Triage Notes (Signed)
Pt brought to triage in wheelchair due to chest pain. Pt c/o left sided chest pain x2 days. Pt sts "I just feel real anxious, I start to sweat." Pt denies SOB, N/V. Pt has trach in place due to sever sleep apnea, per pt.

## 2017-01-17 ENCOUNTER — Observation Stay
Admit: 2017-01-17 | Discharge: 2017-01-17 | Disposition: A | Discharge status: Home / Self Care | Payer: Medicaid Other | Attending: Registered Nurse | Admitting: Registered Nurse

## 2017-01-17 ENCOUNTER — Encounter: Payer: Self-pay | Admitting: Internal Medicine

## 2017-01-17 DIAGNOSIS — R079 Chest pain, unspecified: Secondary | ICD-10-CM | POA: Diagnosis present

## 2017-01-17 LAB — TSH: TSH: 2.251 u[IU]/mL (ref 0.350–4.500)

## 2017-01-17 LAB — TROPONIN I
Troponin I: 0.03 ng/mL (ref <0.03)
Troponin I: <0.03 ng/mL (ref <0.03)
Troponin I: <0.03 ng/mL (ref <0.03)
Troponin I: <0.03 ng/mL (ref <0.03)

## 2017-01-17 LAB — MAGNESIUM: MAGNESIUM: 1.6 mg/dL — AB (ref 1.7–2.4)

## 2017-01-17 MED ORDER — HYDRALAZINE HCL 100 MG PO TABS
100.0000 mg | ORAL_TABLET | Freq: Three times a day (TID) | ORAL | Status: DC
  Filled 2017-01-17: qty 1

## 2017-01-17 MED ORDER — ACETAMINOPHEN 325 MG PO TABS: 650.0000 mg | ORAL_TABLET | Freq: Four times a day (QID) | ORAL | Status: DC | PRN

## 2017-01-17 MED ORDER — HYDRALAZINE HCL 50 MG PO TABS
100.0000 mg | ORAL_TABLET | Freq: Three times a day (TID) | ORAL | Status: DC
  Administered 2017-01-17 (×3): 100 mg via ORAL
  Filled 2017-01-17 (×4): qty 2

## 2017-01-17 MED ORDER — AMIODARONE IV BOLUS ONLY 150 MG/100ML
INTRAVENOUS | Status: AC
  Administered 2017-01-17: 150 mg
  Filled 2017-01-17: qty 100

## 2017-01-17 MED ORDER — DOCUSATE SODIUM 100 MG PO CAPS
100.0000 mg | ORAL_CAPSULE | Freq: Two times a day (BID) | ORAL | Status: DC
  Administered 2017-01-17: 100 mg via ORAL
  Filled 2017-01-17: qty 1

## 2017-01-17 MED ORDER — ONDANSETRON HCL 4 MG/2ML IJ SOLN: 4.0000 mg | Freq: Four times a day (QID) | INTRAMUSCULAR | Status: DC | PRN

## 2017-01-17 MED ORDER — ONDANSETRON HCL 4 MG PO TABS: 4.0000 mg | ORAL_TABLET | Freq: Four times a day (QID) | ORAL | Status: DC | PRN

## 2017-01-17 MED ORDER — LISINOPRIL 20 MG PO TABS
40.0000 mg | ORAL_TABLET | Freq: Two times a day (BID) | ORAL | Status: DC
  Administered 2017-01-17 – 2017-01-18 (×3): 40 mg via ORAL
  Filled 2017-01-17 (×3): qty 2

## 2017-01-17 MED ORDER — CHLORHEXIDINE GLUCONATE 0.12 % MT SOLN
15.0000 mL | Freq: Two times a day (BID) | OROMUCOSAL | Status: DC
  Administered 2017-01-17 – 2017-01-18 (×2): 15 mL via OROMUCOSAL
  Filled 2017-01-17 (×2): qty 15

## 2017-01-17 MED ORDER — LORAZEPAM 1 MG PO TABS
ORAL_TABLET | ORAL | Status: AC
  Administered 2017-01-17: 1 mg via ORAL
  Filled 2017-01-17: qty 1

## 2017-01-17 MED ORDER — AMIODARONE IV BOLUS ONLY 150 MG/100ML
150.0000 mg | Freq: Once | INTRAVENOUS | Status: AC
  Administered 2017-01-17: 150 mg via INTRAVENOUS

## 2017-01-17 MED ORDER — SODIUM CHLORIDE 0.9% FLUSH
3.0000 mL | Freq: Two times a day (BID) | INTRAVENOUS | Status: DC
  Administered 2017-01-17 – 2017-01-18 (×3): 3 mL via INTRAVENOUS

## 2017-01-17 MED ORDER — ORAL CARE MOUTH RINSE
15.0000 mL | Freq: Two times a day (BID) | OROMUCOSAL | Status: DC
  Administered 2017-01-17: 15 mL via OROMUCOSAL

## 2017-01-17 MED ORDER — ENOXAPARIN SODIUM 40 MG/0.4ML ~~LOC~~ SOLN
40.0000 mg | Freq: Two times a day (BID) | SUBCUTANEOUS | Status: DC
  Administered 2017-01-17: 40 mg via SUBCUTANEOUS
  Filled 2017-01-17 (×2): qty 0.4

## 2017-01-17 MED ORDER — AMIODARONE LOAD VIA INFUSION
150.0000 mg | Freq: Once | INTRAVENOUS | Status: DC
  Filled 2017-01-17: qty 83.34

## 2017-01-17 MED ORDER — CLONIDINE HCL 0.1 MG PO TABS
0.1000 mg | ORAL_TABLET | Freq: Three times a day (TID) | ORAL | Status: DC
  Administered 2017-01-17 – 2017-01-18 (×4): 0.1 mg via ORAL
  Filled 2017-01-17 (×4): qty 1

## 2017-01-17 MED ORDER — ACETAMINOPHEN 650 MG RE SUPP: 650.0000 mg | Freq: Four times a day (QID) | RECTAL | Status: DC | PRN

## 2017-01-17 MED ORDER — ASPIRIN EC 81 MG PO TBEC
81.0000 mg | DELAYED_RELEASE_TABLET | Freq: Every day | ORAL | Status: DC
  Administered 2017-01-17 – 2017-01-18 (×2): 81 mg via ORAL
  Filled 2017-01-17 (×2): qty 1

## 2017-01-17 MED ORDER — LORAZEPAM 1 MG PO TABS
1.0000 mg | ORAL_TABLET | Freq: Once | ORAL | Status: AC
  Administered 2017-01-17: 1 mg via ORAL

## 2017-01-17 MED ORDER — FUROSEMIDE 40 MG PO TABS
40.0000 mg | ORAL_TABLET | Freq: Every day | ORAL | Status: DC
  Administered 2017-01-17 – 2017-01-18 (×2): 40 mg via ORAL
  Filled 2017-01-17 (×2): qty 1

## 2017-01-17 MED ORDER — LORAZEPAM 2 MG/ML IJ SOLN
1.0000 mg | INTRAMUSCULAR | Status: DC | PRN
  Administered 2017-01-17 (×2): 1 mg via INTRAVENOUS
  Filled 2017-01-17 (×2): qty 1

## 2017-01-17 MED ORDER — METOPROLOL TARTRATE 50 MG PO TABS
50.0000 mg | ORAL_TABLET | Freq: Three times a day (TID) | ORAL | Status: DC
  Administered 2017-01-17 (×3): 50 mg via ORAL
  Filled 2017-01-17 (×4): qty 1

## 2017-01-17 NOTE — Consult Note (Signed)
Andrew Sparks is a 51 y.o. male  [id number]  Primary Cardiologist: Adrian Blackwater Reason for Consultation:  Chest pain, short run of Vtach  HPI: 51yo black male with history of stroke, NSTEMI, HTN, SOB, and anxiety presented to the ER with chest pain and racing heart with marginally elevated troponin (=0.03). Received 2 amiodarone boluses for symptomatic ventricular tachycardia.   Review of Systems: Pt is resting in bed, alert and oriented x3. Tommi Rumps denies chest pain. He reports some baseline SOB due to tracheostomy.  Past Medical History:  Diagnosis Date  . Anxiety   . Depression   . Hypersomnia with sleep apnea   . Hypertension   . Morbid obesity (HCC)   . NSTEMI (non-ST elevated myocardial infarction) (HCC) 2005  . Obesity hypoventilation syndrome (HCC)   . Shortness of breath dyspnea   . Sleep apnea   . Stroke [facility])     Medications Prior to Admission  Medication Sig Dispense Refill  . acetaminophen (TYLENOL) 325 MG tablet Take 650 mg by mouth every 4 (four) hours as needed (pain/fever).    Marland Kitchen aspirin EC 81 MG tablet Take 81 mg by mouth daily.    . cloNIDine (CATAPRES) 0.1 MG tablet Take 1 tablet (0.1 mg total) by mouth 3 (three) times daily. 90 tablet 1  . furosemide (LASIX) 40 MG tablet Take 1 tablet (40 mg total) by mouth daily. 30 tablet 1  . hydrALAZINE (APRESOLINE) 100 MG tablet Take 1 tablet (100 mg total) by mouth 3 (three) times daily. 90 tablet 1  . lisinopril (PRINIVIL,ZESTRIL) 40 MG tablet Take 1 tablet (40 mg total) by mouth 2 (two) times daily. 60 tablet 1  . metoprolol (LOPRESSOR) 50 MG tablet Take 1 tablet (50 mg total) by mouth 3 (three) times daily. 90 tablet 1     . aspirin EC  81 mg Oral Daily  . chlorhexidine  15 mL Mouth Rinse BID  . cloNIDine  0.1 mg Oral TID  . docusate sodium  100 mg Oral BID  . enoxaparin (LOVENOX) injection  40 mg Subcutaneous Q12H  . furosemide  40 mg Oral Daily  . hydrALAZINE  100 mg Oral TID  . lisinopril  40 mg Oral BID   . mouth rinse  15 mL Mouth Rinse q12n4p  . metoprolol  50 mg Oral TID  . sodium chloride flush  3 mL Intravenous Q12H    Infusions:   No Known Allergies  Social History   Social History  . Marital status: Single    Spouse name: N/A  . Number of children: N/A  . Years of education: N/A   Occupational History  . Not on file.   Social History Main Topics  . Smoking status: Current Every Day Smoker    Packs/day: 0.50    Years: 28.00    Types: Cigarettes  . Smokeless tobacco: Never Used  . Alcohol use 4.8 oz/week    8 Cans of beer per week  . Drug use: No  . Sexual activity: Yes   Other Topics Concern  . Not on file   Social History Narrative  . No narrative on file    Family History  Problem Relation Age of Onset  . Heart disease Father     PHYSICAL EXAM: Vitals:   01/17/17 1008 01/17/17 1151  BP: (!) 152/98 (!) 144/81  Pulse: 63 63  Resp:  12  Temp:  98.2 F (36.8 C)    No intake or output data in the 24 hours  ending 01/17/17 1205  General:  Well appearing. No respiratory difficulty HEENT: normal Neck: supple. no JVD. Carotids 2+ bilat; no bruits. No lymphadenopathy or thryomegaly appreciated. Cor: PMI nondisplaced. Regular rate & rhythm. No rubs, gallops or murmurs. Lungs: clear Abdomen: soft, nontender, nondistended. No hepatosplenomegaly. No bruits or masses. Good bowel sounds. Extremities: no cyanosis, clubbing, rash, edema Neuro: alert & oriented x 3, cranial nerves grossly intact. moves all 4 extremities w/o difficulty. Affect pleasant.  ECG: Sinus rhythm with 1st degree AV block.  Results for orders placed or performed during the hospital encounter of 01/16/17 (from the past 24 hour(s))  Basic metabolic panel     Status: Abnormal   Collection Time: 01/16/17  9:06 PM  Result Value Ref Range   Sodium 137 135 - 145 mmol/L   Potassium 3.5 3.5 - 5.1 mmol/L   Chloride 100 (L) 101 - 111 mmol/L   CO2 29 22 - 32 mmol/L   Glucose, Bld 126 (H) 65  - 99 mg/dL   BUN 18 6 - 20 mg/dL   Creatinine, Ser 2.131.05 0.61 - 1.24 mg/dL   Calcium 8.9 8.9 - 08.610.3 mg/dL   GFR calc non Af Amer >60 >60 mL/min   GFR calc Af Amer >60 >60 mL/min   Anion gap 8 5 - 15  CBC     Status: Abnormal   Collection Time: 01/16/17  9:06 PM  Result Value Ref Range   WBC 6.1 3.8 - 10.6 K/uL   RBC 3.73 (L) 4.40 - 5.90 MIL/uL   Hemoglobin 11.6 (L) 13.0 - 18.0 g/dL   HCT 57.833.7 (L) 46.940.0 - 62.952.0 %   MCV 90.3 80.0 - 100.0 fL   MCH 31.2 26.0 - 34.0 pg   MCHC 34.5 32.0 - 36.0 g/dL   RDW 52.814.2 41.311.5 - 24.414.5 %   Platelets 187 150 - 440 K/uL  Troponin I     Status: None   Collection Time: 01/16/17  9:06 PM  Result Value Ref Range   Troponin I <0.03 <0.03 ng/mL  Troponin I     Status: Abnormal   Collection Time: 01/17/17  1:21 AM  Result Value Ref Range   Troponin I 0.03 (HH) <0.03 ng/mL  TSH     Status: None   Collection Time: 01/17/17  8:30 AM  Result Value Ref Range   TSH 2.251 0.350 - 4.500 uIU/mL  Troponin I     Status: None   Collection Time: 01/17/17  8:30 AM  Result Value Ref Range   Troponin I <0.03 <0.03 ng/mL   Dg Chest 2 View  Result Date: 01/16/2017 CLINICAL DATA:  Two day history of chest pain EXAM: CHEST  2 VIEW COMPARISON:  December 21, 2014 FINDINGS: Tracheostomy catheter tip is 5.3 cm above the carina. No pneumothorax. There is no edema or consolidation. Heart is slightly enlarged with pulmonary vascular within normal limits. No adenopathy. No bone lesions. IMPRESSION: Tracheostomy as described without pneumothorax. Mild cardiomegaly. No edema or consolidation. Electronically Signed   By: Bretta BangWilliam  Woodruff III M.D.   On: 01/16/2017 21:25     ASSESSMENT AND PLAN:  Remains on amiodarone drip. Continue amniodarone infusion. Continue telemetry monitoring. Blood pressure is responding to multi-drug therapy. Echo ordered. Will re-evaluate tomorrow.  Caroleen HammanKristin Faustine Tates, NP-C

## 2017-01-17 NOTE — Progress Notes (Signed)
Pt has #6 trach secured and in place with PMV. 2L O2 inline with trach collar. Ambu bag, suction and extra trach at bedside. Pt denies need for suctioning. Pt encouraged to call should he need anything.

## 2017-01-17 NOTE — ED Notes (Signed)
Pt shows run of V-tach on monitor, MD made aware, EKG printed and placed on chart. See MAR for interventions.

## 2017-01-17 NOTE — Progress Notes (Signed)
Anticoagulation monitoring(Lovenox):  50yo  M ordered Lovenox 40 mg Q24h  Filed Weights   01/16/17 2109  Weight: 275 lb (124.7 kg)   BMI 43.2   Lab Results  Component Value Date   CREATININE 1.05 01/16/2017   CREATININE 0.79 12/18/2014   CREATININE 0.85 12/16/2014   Estimated Creatinine Clearance: 106.5 mL/min (by C-G formula based on SCr of 1.05 mg/dL). Hemoglobin & Hematocrit     Component Value Date/Time   HGB 11.6 (L) 01/16/2017 2106   HGB 12.8 (L) 12/12/2014 0551   HCT 33.7 (L) 01/16/2017 2106   HCT 40.7 12/12/2014 0551     Per Protocol for Patient with estCrcl <>30 ml/min and BMI > 40, will transition to Lovenox 40 mg Q12h.      Bari MantisKristin Shiya Fogelman PharmD Clinical Pharmacist 01/17/2017

## 2017-01-17 NOTE — ED Notes (Signed)
Pt resting in bed, eyes closed, resp even and unlabored

## 2017-01-17 NOTE — Progress Notes (Signed)
Confirmed with Dr. Welton FlakesKhan, patient is not to be on amio drip.

## 2017-01-17 NOTE — Progress Notes (Signed)
Patient briefly seen this morning and examined. Patient presents with chest pain and found to have him to manic nonsustained run of V. Tach.  Continue to monitor troponins. Cardiology consultation Check magnesium

## 2017-01-17 NOTE — H&P (Signed)
Andrew Sparks is an 51 y.o. male.   Chief Complaint: Chest pain HPI: The patient with past medical history of stroke, MI, and obstructive sleep apnea status post tracheostomy placement presents emergency department complaining of chest pain. The patient routinely has chest pains but was more alarmed today when he felt his heart racing in addition to chest pain and shortness of breath. He has a history of panic attacks but the sensation he sprays today was different. He repeatedly had sharp very brief left-sided chest pains that did not radiate. He denies any nausea, vomiting, or diaphoresis. I'll in the emergency department the patient had a nonsustained run of ventricular tachycardia. Repeat troponin in the emergency department was marginally elevated as well. Thus the emergency department staff called the hospitalist service for admission.  Past Medical History:  Diagnosis Date  . Anxiety   . Depression   . Hypersomnia with sleep apnea   . Hypertension   . Morbid obesity ([place])   . NSTEMI (non-ST elevated myocardial infarction) ([place]) 2005  . Obesity hypoventilation syndrome ([place])   . Shortness of breath dyspnea   . Sleep apnea   . Stroke [facility])     Past Surgical History:  Procedure Laterality Date  . TRACHEOSTOMY      Family History  Problem Relation Age of Onset  . Heart disease Father    Social History:  reports that he has been smoking Cigarettes.  He has a 14.00 pack-year smoking history. He has never used smokeless tobacco. He reports that he drinks about 4.8 oz of alcohol per week . He reports that he does not use drugs.  Allergies: No Known Allergies  Prior to Admission medications   Medication Sig Start Date End Date Taking? Authorizing Provider  acetaminophen (TYLENOL) 325 MG tablet Take 650 mg by mouth every 4 (four) hours as needed (pain/fever).   Yes Historical Provider, MD  aspirin EC 81 MG tablet Take 81 mg by mouth daily.   Yes Historical Provider, MD   cloNIDine (CATAPRES) 0.1 MG tablet Take 1 tablet (0.1 mg total) by mouth 3 (three) times daily. 12/23/14  Yes Ivan Anchors Love, PA-C  furosemide (LASIX) 40 MG tablet Take 1 tablet (40 mg total) by mouth daily. 12/23/14  Yes Ivan Anchors Love, PA-C  hydrALAZINE (APRESOLINE) 100 MG tablet Take 1 tablet (100 mg total) by mouth 3 (three) times daily. 12/23/14  Yes Ivan Anchors Love, PA-C  lisinopril (PRINIVIL,ZESTRIL) 40 MG tablet Take 1 tablet (40 mg total) by mouth 2 (two) times daily. 12/23/14  Yes Ivan Anchors Love, PA-C  metoprolol (LOPRESSOR) 50 MG tablet Take 1 tablet (50 mg total) by mouth 3 (three) times daily. 12/23/14  Yes Bary Leriche, PA-C     Results for orders placed or performed during the hospital encounter of 01/16/17 (from the past 48 hour(s))  Basic metabolic panel     Status: Abnormal   Collection Time: 01/16/17  9:06 PM  Result Value Ref Range   Sodium 137 135 - 145 mmol/L   Potassium 3.5 3.5 - 5.1 mmol/L   Chloride 100 (L) 101 - 111 mmol/L   CO2 29 22 - 32 mmol/L   Glucose, Bld 126 (H) 65 - 99 mg/dL   BUN 18 6 - 20 mg/dL   Creatinine, Ser 1.05 0.61 - 1.24 mg/dL   Calcium 8.9 8.9 - 10.3 mg/dL   GFR calc non Af Amer >60 >60 mL/min   GFR calc Af Amer >60 >60 mL/min  Comment: (NOTE) The eGFR has been calculated using the CKD EPI equation. This calculation has not been validated in all clinical situations. eGFR's persistently <60 mL/min signify possible Chronic Kidney Disease.    Anion gap 8 5 - 15  CBC     Status: Abnormal   Collection Time: 01/16/17  9:06 PM  Result Value Ref Range   WBC 6.1 3.8 - 10.6 K/uL   RBC 3.73 (L) 4.40 - 5.90 MIL/uL   Hemoglobin 11.6 (L) 13.0 - 18.0 g/dL   HCT 33.7 (L) 40.0 - 52.0 %   MCV 90.3 80.0 - 100.0 fL   MCH 31.2 26.0 - 34.0 pg   MCHC 34.5 32.0 - 36.0 g/dL   RDW 14.2 11.5 - 14.5 %   Platelets 187 150 - 440 K/uL  Troponin I     Status: None   Collection Time: 01/16/17  9:06 PM  Result Value Ref Range   Troponin I <0.03 <0.03 ng/mL  Troponin  I     Status: Abnormal   Collection Time: 01/17/17  1:21 AM  Result Value Ref Range   Troponin I 0.03 (HH) <0.03 ng/mL    Comment: CRITICAL RESULT CALLED TO, READ BACK BY AND VERIFIED WITH SAMANTHIA ASHLEY @ 0400 ON 01/17/2017 BY CAF    Dg Chest 2 View  Result Date: 01/16/2017 CLINICAL DATA:  Two day history of chest pain EXAM: CHEST  2 VIEW COMPARISON:  December 21, 2014 FINDINGS: Tracheostomy catheter tip is 5.3 cm above the carina. No pneumothorax. There is no edema or consolidation. Heart is slightly enlarged with pulmonary vascular within normal limits. No adenopathy. No bone lesions. IMPRESSION: Tracheostomy as described without pneumothorax. Mild cardiomegaly. No edema or consolidation. Electronically Signed   By: Lowella Grip III M.D.   On: 01/16/2017 21:25    Review of Systems  Constitutional: Negative for chills and fever.  HENT: Negative for sore throat and tinnitus.   Eyes: Negative for blurred vision and redness.  Respiratory: Negative for cough and shortness of breath.   Cardiovascular: Positive for chest pain and palpitations. Negative for orthopnea and PND.  Gastrointestinal: Negative for abdominal pain, diarrhea, nausea and vomiting.  Genitourinary: Negative for dysuria, frequency and urgency.  Musculoskeletal: Negative for joint pain and myalgias.  Skin: Negative for rash.       No lesions  Neurological: Negative for speech change, focal weakness and weakness.  Endo/Heme/Allergies: Does not bruise/bleed easily.       No temperature intolerance  Psychiatric/Behavioral: Negative for depression and suicidal ideas. The patient is nervous/anxious.     Blood pressure (!) 145/96, pulse 66, temperature 98.5 F (36.9 C), temperature source Oral, resp. rate (!) 24, height '5\' 7"'$  (1.702 m), weight 124.7 kg (275 lb), SpO2 96 %. Physical Exam  Vitals reviewed. Constitutional: He is oriented to person, place, and time. He appears well-developed and well-nourished. No distress.   HENT:  Head: Normocephalic and atraumatic.  Mouth/Throat: Oropharynx is clear and moist.  Eyes: EOM are normal. Pupils are equal, round, and reactive to light. Right conjunctiva is injected. Left conjunctiva is injected. No scleral icterus.  Mild exophthalmos   Neck: Normal range of motion. Neck supple. No JVD present. No tracheal deviation present. No thyromegaly present.  Tracheostomy in place  Cardiovascular: Normal rate, regular rhythm and normal heart sounds.  Exam reveals no gallop and no friction rub.   No murmur heard. Respiratory: Effort normal and breath sounds normal. No respiratory distress.  Extensive transmitted upper airway sounds  GI:  Soft. Bowel sounds are normal. He exhibits no distension. There is no tenderness.  Genitourinary:  Genitourinary Comments: Deferred  Musculoskeletal: Normal range of motion. He exhibits no edema.  Lymphadenopathy:    He has no cervical adenopathy.  Neurological: He is alert and oriented to person, place, and time. No cranial nerve deficit.  Skin: Skin is warm and dry. No rash noted. No erythema.  Psychiatric: He has a normal mood and affect. His behavior is normal. Judgment and thought content normal.     Assessment/Plan This is a 51 year old male admitted for chest pain and symptomatic arrhythmia. 1. Chest pain: Atypical cardiac pain. Also difficult to distinguish from his chronic pain associated with baseline work of breathing due to tracheostomy and his panic attacks which she admits are associated with chest wall pain at times. Continue to follow cardiac biomarkers. Monitor telemetry. Cardiology has been consulted. 2. Ventricular tachycardia: Nonsustained but symptomatic. I have placed the defibrillator pads on the patient's chest relatively. He has received a bolus of amiodarone in the emergency department. Continue aspirin 3. Hypertension: Uncontrolled; Continue clonidine, hydralazine, lisinopril and metoprolol. Labetalol as needed  4.  DT prophylaxis: Lovenox 5. GI prophylaxis: None The patient is a full code. Time spent on admission orders and patient care approximately 45 minutes  Harrie Foreman, MD 01/17/2017, 7:27 AM

## 2017-01-17 NOTE — Progress Notes (Signed)
Patient admitted to unit. Oriented to room, call bell, and staff. Bed in lowest position. Fall safety plan reviewed. Full assessment to Epic. Skin assessment verified with Paulino RilyAshley RN. Telemetry box verification with tele clerk and Jamie NT- Box#: [id number]. Will continue to monitor.

## 2017-01-17 NOTE — ED Provider Notes (Signed)
[facility] Emergency Department Provider Note   First MD Initiated Contact with Patient 01/17/17 0007     (approximate)  I have reviewed the triage vital signs and the nursing notes.   HISTORY  Chief Complaint Chest Pain    HPI Nefi Musich is a 51 y.o. male with below of chronic medical conditions including anxiety and bipolar disorder presents to the emergency department with feeling anxious chest tightness. Patient states that he believes that his symptoms are consistent with anxiety attack which she's had in the past. Patient states that he's been noncompliant with his anxiety medication times "3 months secondary to not liking the way to the made him feel. Patient denies any dyspnea no nausea vomiting or diaphoresis. Patient denies   Past Medical History:  Diagnosis Date  . Anxiety   . Depression   . Hypersomnia with sleep apnea   . Hypertension   . Morbid obesity (HCC)   . Obesity hypoventilation syndrome (HCC)   . Shortness of breath dyspnea   . Sleep apnea   . Stroke [facility])     Patient Active Problem List   Diagnosis Date Noted  . Tracheostomy care (HCC)   . 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 Debility 12/16/2014  . Obesity hypoventilation syndrome (HCC) 12/16/2014  . Encephalopathy pulmonary 12/15/2014    History reviewed. No pertinent surgical history.  Prior to Admission medications   Medication Sig Start Date End Date Taking? Authorizing Provider  acetaminophen (TYLENOL) 325 MG tablet Take 650 mg by mouth every 4 (four) hours as needed (pain/fever).    Historical Provider, MD  ALPRAZolam Prudy Feeler) 0.25 MG tablet Take 1 tablet (0.25 mg total) by mouth 2 (two) times daily. 12/23/14   Jacquelynn Cree, PA-C  citalopram (CELEXA) 20 MG tablet Take 1 tablet (20 mg total) by mouth daily. 12/23/14   Jacquelynn Cree, PA-C  cloNIDine (CATAPRES) 0.1 MG tablet Take 1 tablet (0.1 mg total)  by mouth 3 (three) times daily. 12/23/14   Jacquelynn Cree, PA-C  famotidine (PEPCID) 20 MG tablet Take 1 tablet (20 mg total) by mouth 2 (two) times daily. 12/23/14   Jacquelynn Cree, PA-C  furosemide (LASIX) 40 MG tablet Take 1 tablet (40 mg total) by mouth daily. 12/23/14   Jacquelynn Cree, PA-C  hydrALAZINE (APRESOLINE) 100 MG tablet Take 1 tablet (100 mg total) by mouth 3 (three) times daily. 12/23/14   Jacquelynn Cree, PA-C  hydrocortisone (ANUSOL-HC) 25 MG suppository Place 1 suppository (25 mg total) rectally 2 (two) times daily. For hemorroids 12/23/14   Evlyn Kanner Love, PA-C  lisinopril (PRINIVIL,ZESTRIL) 40 MG tablet Take 1 tablet (40 mg total) by mouth 2 (two) times daily. 12/23/14   Jacquelynn Cree, PA-C  magnesium oxide (MAG-OX) 400 (241.3 MG) MG tablet Take 0.5 tablets (200 mg total) by mouth 2 (two) times daily. 12/23/14   Jacquelynn Cree, PA-C  metoprolol (LOPRESSOR) 50 MG tablet Take 1 tablet (50 mg total) by mouth 3 (three) times daily. 12/23/14   Jacquelynn Cree, PA-C  nitroGLYCERIN (NITRO-DUR) 0.2 mg/hr patch Place 1 patch (0.2 mg total) onto the skin daily. 12/23/14   Evlyn Kanner Love, PA-C  potassium chloride SA (K-DUR,KLOR-CON) 20 MEQ tablet Take 1 tablet (20 mEq total) by mouth 2 (two) times daily. 12/23/14   Jacquelynn Cree, PA-C  warfarin (COUMADIN) 5 MG tablet Take a whole pill on Mon, Wed, Friday, Sat, Sun  with supper. Take 1/2 a pill Tues and Thurs with supper 12/24/14   Jacquelynn CreePamela S Love, PA-C  Water For Irrigation, Sterile (FREE WATER) SOLN Place 100 mLs into feeding tube every 12 (twelve) hours. Use filtered or bottled water 12/23/14   Jacquelynn CreePamela S Love, PA-C    Allergies Patient has no known allergies.  History reviewed. No pertinent family history.  Social History Social History  Substance Use Topics  . Smoking status: Current Every Day Smoker    Packs/day: 0.50    Years: 28.00    Types: Cigarettes  . Smokeless tobacco: Never Used  . Alcohol use 4.8 oz/week    8 Cans of beer per week     Review of Systems Constitutional: No fever/chills Eyes: No visual changes. ENT: No sore throat. Cardiovascular: Denies chest pain. Respiratory: Denies shortness of breath. Gastrointestinal: No abdominal pain.  No nausea, no vomiting.  No diarrhea.  No constipation. Genitourinary: Negative for dysuria. Musculoskeletal: Negative for back pain. Skin: Negative for rash. Neurological: Negative for headaches, focal weakness or numbness.  10-point ROS otherwise negative.  ____________________________________________   PHYSICAL EXAM:  VITAL SIGNS: ED Triage Vitals  Enc Vitals Group     BP 01/16/17 2100 (!) 163/92     Pulse Rate 01/16/17 2100 66     Resp --      Temp 01/16/17 2100 98.5 F (36.9 C)     Temp Source 01/16/17 2100 Oral     SpO2 01/16/17 2100 96 %     Weight 01/16/17 2109 275 lb (124.7 kg)     Height 01/16/17 2109 5\' 7"  (1.702 m)     Head Circumference --      Peak Flow --      Pain Score --      Pain Loc --      Pain Edu? --      Excl. in GC? --     Constitutional: Alert and oriented. Well appearing and in no acute distress. Eyes: Conjunctivae are normal. PERRL. EOMI. Head: Atraumatic. Nose: No congestion/rhinnorhea. Mouth/Throat: Mucous membranes are moist. Neck: No stridor.  Cardiovascular: Normal rate, regular rhythm. Good peripheral circulation. Grossly normal heart sounds. Respiratory: Normal respiratory effort.  No retractions. Lungs CTAB. Gastrointestinal: Soft and nontender. No distention.  Musculoskeletal: No lower extremity tenderness nor edema. No gross deformities of extremities. Neurologic:  Normal speech and language. No gross focal neurologic deficits are appreciated.  Skin:  Skin is warm, dry and intact. No rash noted. Psychiatric: Mood and affect are normal. Speech and behavior are normal.  ____________________________________________   LABS (all labs ordered are listed, but only abnormal results are displayed)  Labs Reviewed   BASIC METABOLIC PANEL - Abnormal; Notable for the following:       Result Value   Chloride 100 (*)    Glucose, Bld 126 (*)    All other components within normal limits  CBC - Abnormal; Notable for the following:    RBC 3.73 (*)    Hemoglobin 11.6 (*)    HCT 33.7 (*)    All other components within normal limits  TROPONIN I   ____________________________________________  EKG  ED ECG REPORT I, [place] N Rivaan Kendall, the attending physician, personally viewed and interpreted this ECG.   Date: 01/17/2017  EKG Time: 9:07 PM  Rate: 68  Rhythm: Normal Sinus rhythm  Axis: Normal  Intervals: Normal  ST&T Change: None  ____________________________________________  RADIOLOGY I, [employer] N Floraine Buechler, personally viewed and evaluated these images (plain radiographs) as part  of my medical decision making, as well as reviewing the written report by the radiologist.  Dg Chest 2 View  Result Date: 01/16/2017 CLINICAL DATA:  Two day history of chest pain EXAM: CHEST  2 VIEW COMPARISON:  December 21, 2014 FINDINGS: Tracheostomy catheter tip is 5.3 cm above the carina. No pneumothorax. There is no edema or consolidation. Heart is slightly enlarged with pulmonary vascular within normal limits. No adenopathy. No bone lesions. IMPRESSION: Tracheostomy as described without pneumothorax. Mild cardiomegaly. No edema or consolidation. Electronically Signed   By: Bretta Bang III M.D.   On: 01/16/2017 21:25     Procedures  Critical care:CRITICAL CARE Performed by: Darci Current   Total critical care time: 30 minutes  Critical care time was exclusive of separately billable procedures and treating other patients.  Critical care was necessary to treat or prevent imminent or life-threatening deterioration.  Critical care was time spent personally by me on the following activities: development of treatment plan with patient and/or surrogate as well as nursing, discussions with consultants, evaluation  of patient's response to treatment, examination of patient, obtaining history from patient or surrogate, ordering and performing treatments and interventions, ordering and review of laboratory studies, ordering and review of radiographic studies, pulse oximetry and re-evaluation of patient's condition.  INITIAL IMPRESSION / ASSESSMENT AND PLAN / ED COURSE  Pertinent labs & imaging results that were available during my care of the patient were reviewed by me and considered in my medical decision making (see chart for details).  Patient noted to have a 18 be run of ventricular tachycardia while in the emergency department. As such patient was given amiodarone bolus immediately followed by infusion. Patient discussed with Dr. Sheryle Hail for hospital admission for further evaluation and management.      ____________________________________________  FINAL CLINICAL IMPRESSION(S) / ED DIAGNOSES  Final diagnoses:  Ventricular tachycardia (HCC)     MEDICATIONS GIVEN DURING THIS VISIT:  Medications  LORazepam (ATIVAN) tablet 1 mg (not administered)  LORazepam (ATIVAN) 1 MG tablet (not administered)     NEW OUTPATIENT MEDICATIONS STARTED DURING THIS VISIT:  New Prescriptions   No medications on file    Modified Medications   No medications on file    Discontinued Medications   No medications on file     Note:  This document was prepared using Dragon voice recognition software and may include unintentional dictation errors.    Darci Current, MD 01/17/17 [phone]

## 2017-01-18 DIAGNOSIS — F331 Major depressive disorder, recurrent, moderate: Secondary | ICD-10-CM

## 2017-01-18 DIAGNOSIS — F410 Panic disorder [episodic paroxysmal anxiety] without agoraphobia: Secondary | ICD-10-CM

## 2017-01-18 LAB — BASIC METABOLIC PANEL
ANION GAP: 6 (ref 5–15)
BUN: 15 mg/dL (ref 6–20)
CALCIUM: 8.4 mg/dL — AB (ref 8.9–10.3)
CO2: 33 mmol/L — AB (ref 22–32)
Chloride: 98 mmol/L — ABNORMAL LOW (ref 101–111)
Creatinine, Ser: 0.89 mg/dL (ref 0.61–1.24)
Glucose, Bld: 101 mg/dL — ABNORMAL HIGH (ref 65–99)
Potassium: 3.3 mmol/L — ABNORMAL LOW (ref 3.5–5.1)
Sodium: 137 mmol/L (ref 135–145)

## 2017-01-18 LAB — HEMOGLOBIN A1C
Hgb A1c MFr Bld: 4.7 % — ABNORMAL LOW (ref 4.8–5.6)
Mean Plasma Glucose: 88 mg/dL

## 2017-01-18 LAB — ECHOCARDIOGRAM COMPLETE
Height: 67 in
WEIGHTICAEL: 4400 [oz_av]

## 2017-01-18 MED ORDER — AMIODARONE HCL 200 MG PO TABS: 400.0000 mg | ORAL_TABLET | Freq: Every day | ORAL | 0 refills | Status: DC

## 2017-01-18 MED ORDER — ALPRAZOLAM 0.25 MG PO TABS: 0.2500 mg | ORAL_TABLET | Freq: Every day | ORAL | 0 refills | Status: DC | PRN

## 2017-01-18 MED ORDER — POTASSIUM CHLORIDE CRYS ER 20 MEQ PO TBCR
40.0000 meq | EXTENDED_RELEASE_TABLET | Freq: Once | ORAL | Status: AC
  Administered 2017-01-18: 40 meq via ORAL
  Filled 2017-01-18: qty 2

## 2017-01-18 MED ORDER — MAGNESIUM SULFATE 2 GM/50ML IV SOLN
2.0000 g | Freq: Once | INTRAVENOUS | Status: AC
  Administered 2017-01-18: 2 g via INTRAVENOUS
  Filled 2017-01-18: qty 50

## 2017-01-18 MED ORDER — FLUOXETINE HCL 20 MG PO CAPS: 20.0000 mg | ORAL_CAPSULE | Freq: Every day | ORAL | 1 refills | Status: DC

## 2017-01-18 NOTE — Consult Note (Signed)
[facility] Face-to-Face Psychiatry Consult   Reason for Consult:  Consult for 51 year old man with symptoms of anxiety and depression Referring Physician:  Mody Patient Identification: Andrew Sparks MRN:  [id number] Principal Diagnosis: Panic disorder Diagnosis:   Patient Active Problem List   Diagnosis Date Noted  . 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 Panic disorder [F41.0] 01/18/2017  . Chest pain [R07.9] 01/17/2017  . Tracheostomy care ([place]) [Z43.0]   . Status post tracheostomy ([place]) [Z93.0] 12/16/2014  . Status post gastrostomy ([place]) [Z93.1] 12/16/2014  . Debility [R53.81] 12/16/2014  . Obesity hypoventilation syndrome ([place]) [E66.2] 12/16/2014  . Encephalopathy pulmonary [G93.49] 12/15/2014    Total Time spent with patient: 1 hour  Subjective:   Andrew Sparks is a 51 y.o. male patient admitted with "the panic attack was really bad".  HPI:  Patient interviewed. Chart reviewed. Labs reviewed. Case discussed with hospitalist. 51 year old man presented to the hospital with chest discomfort and shortness of breath. After workup appears to of been an anxiety attack which is the patient's interpretation as well. He says that he gets the sudden attacks that will cause him to feel very fearful and feel like he is dying. He gets chest tightness he feels confused and mentally upset. He starts pacing around and can't get rid of the anxiety. This is what brought him into the hospital. He is been going on for many years possibly 20 years or more and they happen about 3 times a week. He can't pinpoint any specific trigger for them. He is not currently receiving any treatment for them. Additionally he has chronic low mood and a feeling of hopelessness. Low energy. Impaired sleep pattern. Passive thoughts of being better off dead but no thought of actually killing himself. Patient is also not receiving any treatment for depression. Major stresses are his chronic  medical issues which have kept him out of work for years and made him dependent on his family.  Social history: Currently living with his sister. Has no income. Has applied for disability. Spends most of his time up all night watching television. Indicates he has a pretty good relationship with his extended family.  Substance abuse history: He tells me that he used to drink but has cut back on it and indicates that he does not drink much at all. He says his last drink was probably around new years. There is no indication that he's been drinking heavily and no documentation of substance abuse concerns in the chart. Denies any drug use.  Medical history: Patient has a tracheostomy for the last couple years because of an episode of respiratory failure. Multiple chronic medical problems overweight high blood pressure.  Past Psychiatric History: No history of psychiatric hospitalization or suicide attempt. He went to [facility] for a while and took medication. Can't remember what it was. Felt dissatisfied. Has not been on medication for many months.  Risk to Self: Is patient at risk for suicide?: No Risk to Others:   Prior Inpatient Therapy:   Prior Outpatient Therapy:    Past Medical History:  Past Medical History:  Diagnosis Date  . Anxiety   . Depression   . Hypersomnia with sleep apnea   . Hypertension   . Morbid obesity ([place])   . NSTEMI (non-ST elevated myocardial infarction) ([place]) 2005  . Obesity hypoventilation syndrome ([place])   . Shortness of breath dyspnea   . Sleep apnea   . Stroke [facility])     Past Surgical History:  Procedure  Laterality Date  . TRACHEOSTOMY     Family History:  Family History  Problem Relation Age of Onset  . Heart disease Father    Family Psychiatric  History: He thinks his parents may have had some mental health issues but he is very vague about it nothing specific. No known family history of suicide Social History:  History  Alcohol Use  . 4.8 oz/week  .  8 Cans of beer per week     History  Drug Use No    Social History   Social History  . Marital status: Single    Spouse name: N/A  . Number of children: N/A  . Years of education: N/A   Social History Main Topics  . Smoking status: Current Every Day Smoker    Packs/day: 0.50    Years: 28.00    Types: Cigarettes  . Smokeless tobacco: Never Used  . Alcohol use 4.8 oz/week    8 Cans of beer per week  . Drug use: No  . Sexual activity: Yes   Other Topics Concern  . None   Social History Narrative  . None   Additional Social History:    Allergies:  No Known Allergies  Labs:  Results for orders placed or performed during the hospital encounter of 01/16/17 (from the past 48 hour(s))  Basic metabolic panel     Status: Abnormal   Collection Time: 01/16/17  9:06 PM  Result Value Ref Range   Sodium 137 135 - 145 mmol/L   Potassium 3.5 3.5 - 5.1 mmol/L   Chloride 100 (L) 101 - 111 mmol/L   CO2 29 22 - 32 mmol/L   Glucose, Bld 126 (H) 65 - 99 mg/dL   BUN 18 6 - 20 mg/dL   Creatinine, Ser 1.05 0.61 - 1.24 mg/dL   Calcium 8.9 8.9 - 10.3 mg/dL   GFR calc non Af Amer >60 >60 mL/min   GFR calc Af Amer >60 >60 mL/min    Comment: (NOTE) The eGFR has been calculated using the CKD EPI equation. This calculation has not been validated in all clinical situations. eGFR's persistently <60 mL/min signify possible Chronic Kidney Disease.    Anion gap 8 5 - 15  CBC     Status: Abnormal   Collection Time: 01/16/17  9:06 PM  Result Value Ref Range   WBC 6.1 3.8 - 10.6 K/uL   RBC 3.73 (L) 4.40 - 5.90 MIL/uL   Hemoglobin 11.6 (L) 13.0 - 18.0 g/dL   HCT 33.7 (L) 40.0 - 52.0 %   MCV 90.3 80.0 - 100.0 fL   MCH 31.2 26.0 - 34.0 pg   MCHC 34.5 32.0 - 36.0 g/dL   RDW 14.2 11.5 - 14.5 %   Platelets 187 150 - 440 K/uL  Troponin I     Status: None   Collection Time: 01/16/17  9:06 PM  Result Value Ref Range   Troponin I <0.03 <0.03 ng/mL  Troponin I     Status: Abnormal   Collection  Time: 01/17/17  1:21 AM  Result Value Ref Range   Troponin I 0.03 (HH) <0.03 ng/mL    Comment: CRITICAL RESULT CALLED TO, READ BACK BY AND VERIFIED WITH SAMANTHIA ASHLEY @ 0400 ON 01/17/2017 BY CAF   TSH     Status: None   Collection Time: 01/17/17  8:30 AM  Result Value Ref Range   TSH 2.251 0.350 - 4.500 uIU/mL    Comment: Performed by a 3rd Generation assay with  a functional sensitivity of <=0.01 uIU/mL.  Hemoglobin A1c     Status: Abnormal   Collection Time: 01/17/17  8:30 AM  Result Value Ref Range   Hgb A1c MFr Bld 4.7 (L) 4.8 - 5.6 %    Comment: (NOTE)         Pre-diabetes: 5.7 - 6.4         Diabetes: >6.4         Glycemic control for adults with diabetes: <7.0    Mean Plasma Glucose 88 mg/dL    Comment: (NOTE) Performed At: [facility] [address] [place], [place] [id number] Lindon Romp MD [id number]   Troponin I     Status: None   Collection Time: 01/17/17  8:30 AM  Result Value Ref Range   Troponin I <0.03 <0.03 ng/mL  Troponin I     Status: None   Collection Time: 01/17/17  2:33 PM  Result Value Ref Range   Troponin I <0.03 <0.03 ng/mL  Magnesium     Status: Abnormal   Collection Time: 01/17/17  2:33 PM  Result Value Ref Range   Magnesium 1.6 (L) 1.7 - 2.4 mg/dL  Troponin I     Status: None   Collection Time: 01/17/17  8:23 PM  Result Value Ref Range   Troponin I <0.03 <0.03 ng/mL  Basic metabolic panel     Status: Abnormal   Collection Time: 01/18/17  3:53 AM  Result Value Ref Range   Sodium 137 135 - 145 mmol/L   Potassium 3.3 (L) 3.5 - 5.1 mmol/L   Chloride 98 (L) 101 - 111 mmol/L   CO2 33 (H) 22 - 32 mmol/L   Glucose, Bld 101 (H) 65 - 99 mg/dL   BUN 15 6 - 20 mg/dL   Creatinine, Ser 0.89 0.61 - 1.24 mg/dL   Calcium 8.4 (L) 8.9 - 10.3 mg/dL   GFR calc non Af Amer >60 >60 mL/min   GFR calc Af Amer >60 >60 mL/min    Comment: (NOTE) The eGFR has been calculated using the CKD EPI equation. This calculation has not been validated in  all clinical situations. eGFR's persistently <60 mL/min signify possible Chronic Kidney Disease.    Anion gap 6 5 - 15    Current Facility-Administered Medications  Medication Dose Route Frequency Provider Last Rate Last Dose  . acetaminophen (TYLENOL) tablet 650 mg  650 mg Oral Q6H PRN Harrie Foreman, MD       Or  . acetaminophen (TYLENOL) suppository 650 mg  650 mg Rectal Q6H PRN Harrie Foreman, MD      . aspirin EC tablet 81 mg  81 mg Oral Daily Harrie Foreman, MD   81 mg at 01/18/17 1006  . chlorhexidine (PERIDEX) 0.12 % solution 15 mL  15 mL Mouth Rinse BID Bettey Costa, MD   15 mL at 01/18/17 1006  . cloNIDine (CATAPRES) tablet 0.1 mg  0.1 mg Oral TID Harrie Foreman, MD   0.1 mg at 01/18/17 1006  . docusate sodium (COLACE) capsule 100 mg  100 mg Oral BID Harrie Foreman, MD   100 mg at 01/17/17 2134  . enoxaparin (LOVENOX) injection 40 mg  40 mg Subcutaneous Q12H Harrie Foreman, MD   40 mg at 01/17/17 2134  . furosemide (LASIX) tablet 40 mg  40 mg Oral Daily Harrie Foreman, MD   40 mg at 01/18/17 1006  . hydrALAZINE (APRESOLINE) tablet 100 mg  100 mg Oral TID  Bettey Costa, MD   100 mg at 01/17/17 2134  . lisinopril (PRINIVIL,ZESTRIL) tablet 40 mg  40 mg Oral BID Harrie Foreman, MD   40 mg at 01/18/17 1006  . LORazepam (ATIVAN) injection 1 mg  1 mg Intravenous Q4H PRN Harrie Foreman, MD   1 mg at 01/17/17 2134  . magnesium sulfate IVPB 2 g 50 mL  2 g Intravenous Once Bettey Costa, MD      . MEDLINE mouth rinse  15 mL Mouth Rinse q12n4p Sital Mody, MD   15 mL at 01/17/17 1705  . metoprolol (LOPRESSOR) tablet 50 mg  50 mg Oral TID Harrie Foreman, MD   Stopped at 01/18/17 1005  . ondansetron (ZOFRAN) tablet 4 mg  4 mg Oral Q6H PRN Harrie Foreman, MD       Or  . ondansetron [facility]) injection 4 mg  4 mg Intravenous Q6H PRN Harrie Foreman, MD      . potassium chloride SA (K-DUR,KLOR-CON) CR tablet 40 mEq  40 mEq Oral Once Bettey Costa, MD      . sodium chloride  flush (NS) 0.9 % injection 3 mL  3 mL Intravenous Q12H Harrie Foreman, MD   3 mL at 01/18/17 1000    Musculoskeletal: Strength & Muscle Tone: within normal limits Gait & Station: normal Patient leans: N/A  Psychiatric Specialty Exam: Physical Exam  Nursing note and vitals reviewed. Constitutional: He appears well-developed and well-nourished.  HENT:  Head: Normocephalic and atraumatic.  Eyes: Conjunctivae are normal. Pupils are equal, round, and reactive to light.  Neck: Normal range of motion.  Cardiovascular: Regular rhythm and normal heart sounds.   Respiratory: Effort normal. No respiratory distress.  GI: Soft.  Musculoskeletal: Normal range of motion.  Neurological: He is alert.  Skin: Skin is warm and dry.  Psychiatric: His speech is normal and behavior is normal. Judgment and thought content normal. His mood appears anxious. Cognition and memory are normal. He exhibits a depressed mood.    Review of Systems  Constitutional: Negative.   HENT: Negative.   Eyes: Negative.   Respiratory: Negative.   Cardiovascular: Negative.   Gastrointestinal: Negative.   Musculoskeletal: Negative.   Skin: Negative.   Neurological: Negative.   Psychiatric/Behavioral: Positive for depression and hallucinations. Negative for memory loss, substance abuse and suicidal ideas. The patient is not nervous/anxious and does not have insomnia.     Blood pressure 137/73, pulse (!) 59, temperature 98.6 F (37 C), temperature source Oral, resp. rate 20, height _0  (1.702 m), weight 126.2 kg (278 lb 3.5 oz), SpO2 100 %.Body mass index is 43.58 kg/m.  General Appearance: Casual  Eye Contact:  Good  Speech:  Normal Rate  Volume:  Normal  Mood:  Dysphoric  Affect:  Congruent  Thought Process:  Goal Directed  Orientation:  Full (Time, Place, and Person)  Thought Content:  Logical  Suicidal Thoughts:  Yes.  without intent/plan  Homicidal Thoughts:  No  Memory:  Immediate;   Good Recent;    Fair Remote;   Fair  Judgement:  Fair  Insight:  Fair  Psychomotor Activity:  Decreased  Concentration:  Concentration: Fair  Recall:  [employer] of Knowledge:  Fair  Language:  Fair  Akathisia:  No  Handed:  Right  AIMS (if indicated):     Assets:  Communication Skills Desire for Improvement Housing Social Support  ADL's:  Intact  Cognition:  WNL  Sleep:  Treatment Plan Summary: Medication management and Plan 51 year old man with symptoms of panic attacks and depression. Passive suicidal thoughts with no intent or plan. Vague visual hallucinations which do not count of psychosis. Cooperative with treatment. Safe place to live. Patient was educated about panic and depression. I suggest that he follow-up with [facility] and get outpatient treatment and try to find a therapist as well. In terms of medication I suggest starting fluoxetine 20 mg a day and will write a prescription for 30 pills and a refill. I have also added a prescription for alprazolam 0.25 mg with 20 pills dispensed and no refill. Patient was warned about the risk of dependency with this medicine and advised to take it only as needed when a panic attack is starting. Case reviewed with hospitalist. Patient can be discharged from the hospital. He expresses an understanding and agreement to the plan.  Disposition: Patient does not meet criteria for psychiatric inpatient admission. Supportive therapy provided about ongoing stressors.  Alethia Berthold, MD 01/18/2017 11:58 AM

## 2017-01-18 NOTE — Care Management (Signed)
no needs identified by members of the care team

## 2017-01-18 NOTE — Progress Notes (Signed)
SUBJECTIVE: Patient denies any chest pain or shortness of breath   Vitals:   01/17/17 1151 01/17/17 1704 01/17/17 2007 01/18/17 0526  BP: (!) 144/81 127/71 (!) 146/79 126/73  Pulse: 63 60 67 (!) 51  Resp: 12  18 18   Temp: 98.2 F (36.8 C)  98.1 F (36.7 C) 98.1 F (36.7 C)  TempSrc: Oral  Oral Oral  SpO2: 97%  94% 96%  Weight:    278 lb 3.5 oz (126.2 kg)  Height:        Intake/Output Summary (Last 24 hours) at 01/18/17 0814 Last data filed at 01/18/17 0017  Gross per 24 hour  Intake              600 ml  Output             1150 ml  Net             -550 ml    LABS: Basic Metabolic Panel:  Recent Labs  96/03/5401/13/18 2106 01/17/17 1433 01/18/17 0353  NA 137  --  137  K 3.5  --  3.3*  CL 100*  --  98*  CO2 29  --  33*  GLUCOSE 126*  --  101*  BUN 18  --  15  CREATININE 1.05  --  0.89  CALCIUM 8.9  --  8.4*  MG  --  1.6*  --    Liver Function Tests: No results for input(s): AST, ALT, ALKPHOS, BILITOT, PROT, ALBUMIN in the last 72 hours. No results for input(s): LIPASE, AMYLASE in the last 72 hours. CBC:  Recent Labs  01/16/17 2106  WBC 6.1  HGB 11.6*  HCT 33.7*  MCV 90.3  PLT 187   Cardiac Enzymes:  Recent Labs  01/17/17 0830 01/17/17 1433 01/17/17 2023  TROPONINI <0.03 <0.03 <0.03   BNP: Invalid input(s): POCBNP D-Dimer: No results for input(s): DDIMER in the last 72 hours. Hemoglobin A1C:  Recent Labs  01/17/17 0830  HGBA1C 4.7*   Fasting Lipid Panel: No results for input(s): CHOL, HDL, LDLCALC, TRIG, CHOLHDL, LDLDIRECT in the last 72 hours. Thyroid Function Tests:  Recent Labs  01/17/17 0830  TSH 2.251   Anemia Panel: No results for input(s): VITAMINB12, FOLATE, FERRITIN, TIBC, IRON, RETICCTPCT in the last 72 hours.   PHYSICAL EXAM General: Well developed, well nourished, in no acute distress HEENT:  Normocephalic and atramatic Neck:  No JVD.  Lungs: Clear bilaterally to auscultation and percussion. Heart: HRRR . Normal S1 and S2  without gallops or murmurs.  Abdomen: Bowel sounds are positive, abdomen soft and non-tender  Msk:  Back normal, normal gait. Normal strength and tone for age. Extremities: No clubbing, cyanosis or edema.   Neuro: Alert and oriented X 3. Psych:  Good affect, responds appropriately  TELEMETRY:Sinus rhythm  ASSESSMENT AND PLAN: Atypical chest pain with normal ejection fraction normal wall motion and cardiac enzymes are negative. Patient can go home with follow-up on the office on Tuesday at 10:00.  Active Problems:   Chest pain    Iliyah Bui A, MD, [facility] 01/18/2017 8:14 AM

## 2017-01-18 NOTE — Discharge Summary (Signed)
[facility] [place] at [facility]   PATIENT NAME: Andrew Sparks    MR#:  [id number]  DATE OF BIRTH:  1966-07-22  DATE OF ADMISSION:  01/16/2017 ADMITTING PHYSICIAN: Arnaldo Natal, MD  DATE OF DISCHARGE: 01/18/2017  PRIMARY CARE PHYSICIAN: Pasty Spillers McLean-Scocuzza, MD    ADMISSION DIAGNOSIS:  Ventricular tachycardia (HCC) [I47.2]  DISCHARGE DIAGNOSIS:  Active Problems:   Chest pain   SECONDARY DIAGNOSIS:   Past Medical History:  Diagnosis Date  . Anxiety   . Depression   . Hypersomnia with sleep apnea   . Hypertension   . Morbid obesity (HCC)   . NSTEMI (non-ST elevated myocardial infarction) (HCC) 2005  . Obesity hypoventilation syndrome (HCC)   . Shortness of breath dyspnea   . Sleep apnea   . Stroke [facility])     HOSPITAL COURSE:   51 year old male with a history of obesity hypoventilation syndrome status post tracheostomy and anxiety who presented with chest pain and found to have symptomatic ventricular tachycardia.  1. Symptomatic ventricular tachycardia: Patient was initiated on amiodarone drip while in the emergency room. He had converted to normal sinus rhythm. He had no acute events on telemetry. Echocardiogram showed normal ejection fraction without valvular abnormalities and no wall motion abnormality. Patient was evaluated by cardiology. Recommendations were to start oral amiodarone 400 mg daily with outpatient cardiology follow-up.  2. Atypical chest pain in the setting of panic attack/panic disorder: Cardiac enzymes were normal. His chest pain was felt to be anxiety/panic attack. He was evaluated by psychiatry  3. Essential hypertension: Patient will continue clonidine, hydralazine, lisinopril and metoprolol 4. OSA/obesity hypoventilation syndrome status post tracheostomy  5. Panic disorder and Depression: Patient was evaluated by Cardiology and he was given a Rx for Prozac and Xanax by Dr Toni Amend. He should be [facility] to [facility] DISCHARGE  CONDITIONS AND DIET:   Stable for discharge on heart healthy diet  CONSULTS OBTAINED:  Treatment Team:  Laurier Nancy, MD Audery Amel, MD  DRUG ALLERGIES:  No Known Allergies  DISCHARGE MEDICATIONS:   Current Discharge Medication List    START taking these medications   Details  ALPRAZolam (XANAX) 0.25 MG tablet Take 1 tablet (0.25 mg total) by mouth daily as needed for anxiety (panic attack). Qty: 20 tablet, Refills: 0    amiodarone (PACERONE) 200 MG tablet Take 2 tablets (400 mg total) by mouth daily. Qty: 30 tablet, Refills: 0    FLUoxetine (PROZAC) 20 MG capsule Take 1 capsule (20 mg total) by mouth daily. Qty: 30 capsule, Refills: 1      CONTINUE these medications which have NOT CHANGED   Details  acetaminophen (TYLENOL) 325 MG tablet Take 650 mg by mouth every 4 (four) hours as needed (pain/fever).    aspirin EC 81 MG tablet Take 81 mg by mouth daily.    cloNIDine (CATAPRES) 0.1 MG tablet Take 1 tablet (0.1 mg total) by mouth 3 (three) times daily. Qty: 90 tablet, Refills: 1    furosemide (LASIX) 40 MG tablet Take 1 tablet (40 mg total) by mouth daily. Qty: 30 tablet, Refills: 1    hydrALAZINE (APRESOLINE) 100 MG tablet Take 1 tablet (100 mg total) by mouth 3 (three) times daily. Qty: 90 tablet, Refills: 1    lisinopril (PRINIVIL,ZESTRIL) 40 MG tablet Take 1 tablet (40 mg total) by mouth 2 (two) times daily. Qty: 60 tablet, Refills: 1    metoprolol (LOPRESSOR) 50 MG tablet Take 1 tablet (50 mg total) by mouth 3 (  three) times daily. Qty: 90 tablet, Refills: 1              Today   CHIEF COMPLAINT:   Doing well without chest pain this morning. Feels that the chest pain was due to anxiety attack    VITAL SIGNS:  Blood pressure 137/73, pulse (!) 59, temperature 98.6 F (37 C), temperature source Oral, resp. rate 20, height 5\' 7"  (1.702 m), weight 126.2 kg (278 lb 3.5 oz), SpO2 100 %.   REVIEW OF SYSTEMS:  Review of Systems  Constitutional:  Negative.  Negative for chills, fever and malaise/fatigue.  HENT: Negative.  Negative for ear discharge, ear pain, hearing loss, nosebleeds and sore throat.   Eyes: Negative.  Negative for blurred vision and pain.  Respiratory: Negative.  Negative for cough, hemoptysis, shortness of breath and wheezing.   Cardiovascular: Negative.  Negative for chest pain, palpitations and leg swelling.  Gastrointestinal: Negative.  Negative for abdominal pain, blood in stool, diarrhea, nausea and vomiting.  Genitourinary: Negative.  Negative for dysuria.  Musculoskeletal: Negative.  Negative for back pain.  Skin: Negative.   Neurological: Negative for dizziness, tremors, speech change, focal weakness, seizures and headaches.  Endo/Heme/Allergies: Negative.  Does not bruise/bleed easily.  Psychiatric/Behavioral: Negative for depression, hallucinations and suicidal ideas. The patient is nervous/anxious.      PHYSICAL EXAMINATION:  GENERAL:  51 y.o.-year-old patient lying in the bed with no acute distress.  NECK:  Supple, no jugular venous distention. No thyroid enlargement, no tenderness. Has tracheostomy  LUNGS: Normal breath sounds bilaterally, no wheezing, rales,rhonchi  No use of accessory muscles of respiration.  CARDIOVASCULAR: S1, S2 normal. No murmurs, rubs, or gallops.  ABDOMEN: Soft, non-tender, non-distended. Bowel sounds present. No organomegaly or mass.  EXTREMITIES: No pedal edema, cyanosis, or clubbing.  PSYCHIATRIC: The patient is alert and oriented x 3.  SKIN: No obvious rash, lesion, or ulcer.   DATA REVIEW:   CBC  Recent Labs Lab 01/16/17 2106  WBC 6.1  HGB 11.6*  HCT 33.7*  PLT 187    Chemistries   Recent Labs Lab 01/17/17 1433 01/18/17 0353  NA  --  137  K  --  3.3*  CL  --  98*  CO2  --  33*  GLUCOSE  --  101*  BUN  --  15  CREATININE  --  0.89  CALCIUM  --  8.4*  MG 1.6*  --     Cardiac Enzymes  Recent Labs Lab 01/17/17 0830 01/17/17 1433  01/17/17 2023  TROPONINI <0.03 <0.03 <0.03    Microbiology Results  @[email or web address] @  RADIOLOGY:  Dg Chest 2 View  Result Date: 01/16/2017 CLINICAL DATA:  Two day history of chest pain EXAM: CHEST  2 VIEW COMPARISON:  December 21, 2014 FINDINGS: Tracheostomy catheter tip is 5.3 cm above the carina. No pneumothorax. There is no edema or consolidation. Heart is slightly enlarged with pulmonary vascular within normal limits. No adenopathy. No bone lesions. IMPRESSION: Tracheostomy as described without pneumothorax. Mild cardiomegaly. No edema or consolidation. Electronically Signed   By: Bretta BangWilliam  Woodruff III M.D.   On: 01/16/2017 21:25      Management plans discussed with the patient and he is in agreement. Stable for discharge   Patient should follow up with cardiology Tuesday @ 10:00a.m.  CODE STATUS:     Code Status Orders        Start     Ordered   01/17/17 0827  Full code  Continuous  01/17/17 0827    Code Status History    Date Active Date Inactive Code Status Order ID Comments User Context   12/15/2014  3:47 PM 12/23/2014  2:43 PM Full Code [id number]  Jacquelynn Cree, PA-C Inpatient      TOTAL TIME TAKING CARE OF THIS PATIENT: 37 minutes.    Note: This dictation was prepared with Dragon dictation along with smaller phrase technology. Any transcriptional errors that result from this process are unintentional.  Wandalene Abrams M.D on 01/18/2017 at 11:53 AM  Between 7am to 6pm - Pager - [phone] After 6pm go to [email or web address] - Social research officer, government  [facility]  Office  [phone]  CC: Primary care physician; Pasty Spillers McLean-Scocuzza, MD

## 2017-01-18 NOTE — Progress Notes (Signed)
Discharge paperwork reviewed with patient. Information regarding follow-up appointments, medications and education for all newly prescribed meds was provided, all questions answered. Peripheral IV removed, catheter intact. Heart monitor removed, returned to the nurse station. Transport requested via wheelchair to the lobby for discharge.    

## 2017-02-20 ENCOUNTER — Institutional Professional Consult (permissible substitution): Payer: Disability Insurance | Admitting: Internal Medicine

## 2017-04-16 ENCOUNTER — Telehealth: Payer: Self-pay

## 2017-04-20 NOTE — Telephone Encounter (Signed)
error 

## 2017-07-17 ENCOUNTER — Emergency Department: Payer: Medicare Other

## 2017-07-17 ENCOUNTER — Inpatient Hospital Stay
Admit: 2017-07-17 | Discharge: 2017-07-17 | Disposition: A | Discharge status: Home / Self Care | Payer: Medicare Other | Attending: Internal Medicine | Admitting: Internal Medicine

## 2017-07-17 ENCOUNTER — Inpatient Hospital Stay
Admission: EM | Admit: 2017-07-17 | Discharge: 2017-07-19 | DRG: 291 | Disposition: A | Discharge status: Home / Self Care | Payer: Medicare Other | Attending: Specialist | Admitting: Specialist

## 2017-07-17 ENCOUNTER — Encounter: Payer: Self-pay | Admitting: Emergency Medicine

## 2017-07-17 DIAGNOSIS — Z8249 Family history of ischemic heart disease and other diseases of the circulatory system: Secondary | ICD-10-CM | POA: Diagnosis not present

## 2017-07-17 DIAGNOSIS — F17210 Nicotine dependence, cigarettes, uncomplicated: Secondary | ICD-10-CM | POA: Diagnosis present

## 2017-07-17 DIAGNOSIS — E785 Hyperlipidemia, unspecified: Secondary | ICD-10-CM | POA: Diagnosis present

## 2017-07-17 DIAGNOSIS — E662 Morbid (severe) obesity with alveolar hypoventilation: Secondary | ICD-10-CM | POA: Diagnosis present

## 2017-07-17 DIAGNOSIS — J441 Chronic obstructive pulmonary disease with (acute) exacerbation: Secondary | ICD-10-CM | POA: Diagnosis present

## 2017-07-17 DIAGNOSIS — I5023 Acute on chronic systolic (congestive) heart failure: Secondary | ICD-10-CM | POA: Diagnosis present

## 2017-07-17 DIAGNOSIS — Z6837 Body mass index (BMI) 37.0-37.9, adult: Secondary | ICD-10-CM | POA: Diagnosis not present

## 2017-07-17 DIAGNOSIS — Z833 Family history of diabetes mellitus: Secondary | ICD-10-CM | POA: Diagnosis not present

## 2017-07-17 DIAGNOSIS — I110 Hypertensive heart disease with heart failure: Principal | ICD-10-CM | POA: Diagnosis present

## 2017-07-17 DIAGNOSIS — Z9981 Dependence on supplemental oxygen: Secondary | ICD-10-CM

## 2017-07-17 DIAGNOSIS — Z931 Gastrostomy status: Secondary | ICD-10-CM

## 2017-07-17 DIAGNOSIS — I252 Old myocardial infarction: Secondary | ICD-10-CM

## 2017-07-17 DIAGNOSIS — I2510 Atherosclerotic heart disease of native coronary artery without angina pectoris: Secondary | ICD-10-CM | POA: Diagnosis present

## 2017-07-17 DIAGNOSIS — Z930 Tracheostomy status: Secondary | ICD-10-CM

## 2017-07-17 DIAGNOSIS — Z7982 Long term (current) use of aspirin: Secondary | ICD-10-CM

## 2017-07-17 DIAGNOSIS — J189 Pneumonia, unspecified organism: Secondary | ICD-10-CM | POA: Diagnosis present

## 2017-07-17 DIAGNOSIS — J9621 Acute and chronic respiratory failure with hypoxia: Secondary | ICD-10-CM | POA: Diagnosis present

## 2017-07-17 DIAGNOSIS — F329 Major depressive disorder, single episode, unspecified: Secondary | ICD-10-CM | POA: Diagnosis present

## 2017-07-17 DIAGNOSIS — Z8673 Personal history of transient ischemic attack (TIA), and cerebral infarction without residual deficits: Secondary | ICD-10-CM | POA: Diagnosis not present

## 2017-07-17 DIAGNOSIS — J440 Chronic obstructive pulmonary disease with acute lower respiratory infection: Secondary | ICD-10-CM | POA: Diagnosis present

## 2017-07-17 DIAGNOSIS — R531 Weakness: Secondary | ICD-10-CM | POA: Diagnosis present

## 2017-07-17 DIAGNOSIS — I509 Heart failure, unspecified: Secondary | ICD-10-CM

## 2017-07-17 LAB — TROPONIN I
TROPONIN I: 0.03 ng/mL — AB (ref <0.03)
TROPONIN I: 0.03 ng/mL — AB (ref <0.03)
Troponin I: 0.03 ng/mL (ref <0.03)

## 2017-07-17 LAB — CBC
HEMATOCRIT: 34.8 % — AB (ref 40.0–52.0)
HEMOGLOBIN: 11.4 g/dL — AB (ref 13.0–18.0)
MCH: 29.5 pg (ref 26.0–34.0)
MCHC: 32.7 g/dL (ref 32.0–36.0)
MCV: 90.4 fL (ref 80.0–100.0)
Platelets: 169 10*3/uL (ref 150–440)
RBC: 3.85 MIL/uL — ABNORMAL LOW (ref 4.40–5.90)
RDW: 14.1 % (ref 11.5–14.5)
WBC: 4.8 10*3/uL (ref 3.8–10.6)

## 2017-07-17 LAB — BASIC METABOLIC PANEL
ANION GAP: 10 (ref 5–15)
BUN: 9 mg/dL (ref 6–20)
CO2: 31 mmol/L (ref 22–32)
Calcium: 8.9 mg/dL (ref 8.9–10.3)
Chloride: 97 mmol/L — ABNORMAL LOW (ref 101–111)
Creatinine, Ser: 0.85 mg/dL (ref 0.61–1.24)
GFR calc Af Amer: >60 mL/min (ref >60)
Glucose, Bld: 94 mg/dL (ref 65–99)
POTASSIUM: 3.4 mmol/L — AB (ref 3.5–5.1)
SODIUM: 138 mmol/L (ref 135–145)

## 2017-07-17 LAB — URINALYSIS, COMPLETE (UACMP) WITH MICROSCOPIC
BACTERIA UA: NONE SEEN
BILIRUBIN URINE: NEGATIVE
GLUCOSE, UA: NEGATIVE mg/dL
HGB URINE DIPSTICK: NEGATIVE
Ketones, ur: 5 mg/dL — AB
LEUKOCYTES UA: NEGATIVE
NITRITE: NEGATIVE
Protein, ur: NEGATIVE mg/dL
SPECIFIC GRAVITY, URINE: 1.013 (ref 1.005–1.030)
pH: 8 (ref 5.0–8.0)

## 2017-07-17 LAB — BRAIN NATRIURETIC PEPTIDE: B Natriuretic Peptide: 397 pg/mL — ABNORMAL HIGH (ref 0.0–100.0)

## 2017-07-17 LAB — GLUCOSE, CAPILLARY: Glucose-Capillary: 124 mg/dL — ABNORMAL HIGH (ref 65–99)

## 2017-07-17 LAB — MAGNESIUM: Magnesium: 1.5 mg/dL — ABNORMAL LOW (ref 1.7–2.4)

## 2017-07-17 MED ORDER — IPRATROPIUM-ALBUTEROL 0.5-2.5 (3) MG/3ML IN SOLN
3.0000 mL | Freq: Once | RESPIRATORY_TRACT | Status: AC
  Administered 2017-07-17: 3 mL via RESPIRATORY_TRACT
  Filled 2017-07-17: qty 3

## 2017-07-17 MED ORDER — HEPARIN SODIUM (PORCINE) 5000 UNIT/ML IJ SOLN
5000.0000 [IU] | Freq: Three times a day (TID) | INTRAMUSCULAR | Status: DC
  Administered 2017-07-17 – 2017-07-19 (×6): 5000 [IU] via SUBCUTANEOUS
  Filled 2017-07-17 (×6): qty 1

## 2017-07-17 MED ORDER — DEXTROSE 5 % IV SOLN
1.0000 g | Freq: Once | INTRAVENOUS | Status: AC
  Administered 2017-07-17: 1 g via INTRAVENOUS
  Filled 2017-07-17: qty 10

## 2017-07-17 MED ORDER — ACETAMINOPHEN 325 MG PO TABS: 650.0000 mg | ORAL_TABLET | ORAL | Status: DC | PRN

## 2017-07-17 MED ORDER — MAGNESIUM SULFATE 2 GM/50ML IV SOLN
2.0000 g | Freq: Once | INTRAVENOUS | Status: AC
  Administered 2017-07-17: 2 g via INTRAVENOUS
  Filled 2017-07-17: qty 50

## 2017-07-17 MED ORDER — HYDRALAZINE HCL 50 MG PO TABS
100.0000 mg | ORAL_TABLET | Freq: Three times a day (TID) | ORAL | Status: DC
  Administered 2017-07-17 – 2017-07-19 (×6): 100 mg via ORAL
  Filled 2017-07-17 (×6): qty 2

## 2017-07-17 MED ORDER — ALPRAZOLAM 0.25 MG PO TABS
0.2500 mg | ORAL_TABLET | Freq: Every day | ORAL | Status: DC | PRN
  Administered 2017-07-18 – 2017-07-19 (×2): 0.25 mg via ORAL
  Filled 2017-07-17 (×2): qty 1

## 2017-07-17 MED ORDER — DEXTROSE 5 % IV SOLN
500.0000 mg | INTRAVENOUS | Status: DC
  Administered 2017-07-17: 500 mg via INTRAVENOUS
  Filled 2017-07-17 (×3): qty 500

## 2017-07-17 MED ORDER — ORAL CARE MOUTH RINSE
15.0000 mL | Freq: Two times a day (BID) | OROMUCOSAL | Status: DC
  Administered 2017-07-17 – 2017-07-19 (×3): 15 mL via OROMUCOSAL

## 2017-07-17 MED ORDER — METHYLPREDNISOLONE SODIUM SUCC 125 MG IJ SOLR
125.0000 mg | Freq: Once | INTRAMUSCULAR | Status: AC
  Administered 2017-07-17: 125 mg via INTRAVENOUS
  Filled 2017-07-17: qty 2

## 2017-07-17 MED ORDER — FUROSEMIDE 10 MG/ML IJ SOLN
40.0000 mg | Freq: Once | INTRAMUSCULAR | Status: AC
  Administered 2017-07-17: 40 mg via INTRAVENOUS
  Filled 2017-07-17: qty 4

## 2017-07-17 MED ORDER — DEXTROSE 5 % IV SOLN
1.0000 g | INTRAVENOUS | Status: DC
  Administered 2017-07-18 – 2017-07-19 (×2): 1 g via INTRAVENOUS
  Filled 2017-07-17 (×2): qty 10

## 2017-07-17 MED ORDER — AMIODARONE HCL 200 MG PO TABS
400.0000 mg | ORAL_TABLET | Freq: Every day | ORAL | Status: DC
  Administered 2017-07-17 – 2017-07-18 (×2): 400 mg via ORAL
  Filled 2017-07-17 (×3): qty 2

## 2017-07-17 MED ORDER — FUROSEMIDE 10 MG/ML IJ SOLN
40.0000 mg | Freq: Two times a day (BID) | INTRAMUSCULAR | Status: DC
  Administered 2017-07-17 – 2017-07-18 (×3): 40 mg via INTRAVENOUS
  Filled 2017-07-17 (×3): qty 4

## 2017-07-17 MED ORDER — ASPIRIN EC 81 MG PO TBEC
81.0000 mg | DELAYED_RELEASE_TABLET | Freq: Every day | ORAL | Status: DC
  Administered 2017-07-17 – 2017-07-19 (×3): 81 mg via ORAL
  Filled 2017-07-17 (×3): qty 1

## 2017-07-17 MED ORDER — LISINOPRIL 10 MG PO TABS
10.0000 mg | ORAL_TABLET | Freq: Every day | ORAL | Status: DC
  Administered 2017-07-17 – 2017-07-19 (×3): 10 mg via ORAL
  Filled 2017-07-17 (×3): qty 1

## 2017-07-17 MED ORDER — SODIUM CHLORIDE 0.9 % IV SOLN: 250.0000 mL | INTRAVENOUS | Status: DC | PRN

## 2017-07-17 MED ORDER — DOXYCYCLINE HYCLATE 100 MG PO TABS
100.0000 mg | ORAL_TABLET | Freq: Once | ORAL | Status: AC
  Administered 2017-07-17: 100 mg via ORAL
  Filled 2017-07-17: qty 1

## 2017-07-17 MED ORDER — ASPIRIN EC 81 MG PO TBEC: 81.0000 mg | DELAYED_RELEASE_TABLET | Freq: Every day | ORAL | Status: DC

## 2017-07-17 MED ORDER — SODIUM CHLORIDE 0.9% FLUSH
3.0000 mL | Freq: Two times a day (BID) | INTRAVENOUS | Status: DC
  Administered 2017-07-17 – 2017-07-19 (×5): 3 mL via INTRAVENOUS

## 2017-07-17 MED ORDER — ONDANSETRON HCL 4 MG/2ML IJ SOLN: 4.0000 mg | Freq: Four times a day (QID) | INTRAMUSCULAR | Status: DC | PRN

## 2017-07-17 MED ORDER — SODIUM CHLORIDE 0.9% FLUSH: 3.0000 mL | INTRAVENOUS | Status: DC | PRN

## 2017-07-17 MED ORDER — CLONIDINE HCL 0.1 MG PO TABS
0.1000 mg | ORAL_TABLET | Freq: Three times a day (TID) | ORAL | Status: DC
  Administered 2017-07-17 – 2017-07-19 (×6): 0.1 mg via ORAL
  Filled 2017-07-17 (×6): qty 1

## 2017-07-17 MED ORDER — FLUOXETINE HCL 20 MG PO CAPS
20.0000 mg | ORAL_CAPSULE | Freq: Every day | ORAL | Status: DC
  Administered 2017-07-17 – 2017-07-19 (×3): 20 mg via ORAL
  Filled 2017-07-17 (×3): qty 1

## 2017-07-17 MED ORDER — POTASSIUM CHLORIDE CRYS ER 20 MEQ PO TBCR
20.0000 meq | EXTENDED_RELEASE_TABLET | Freq: Once | ORAL | Status: AC
  Administered 2017-07-17: 20 meq via ORAL
  Filled 2017-07-17: qty 1

## 2017-07-17 NOTE — H&P (Signed)
[facility]  at [facility]   PATIENT NAME: Andrew Sparks    MR#:  [id number]  DATE OF BIRTH:  04/03/1966  DATE OF ADMISSION:  07/17/2017  PRIMARY CARE PHYSICIAN: McLean-Scocuzza, Pasty Spillersracy N, MD   REQUESTING/REFERRING PHYSICIAN:   CHIEF COMPLAINT:   Chief Complaint  Patient presents with  . Weakness  . Chest Pain    HISTORY OF PRESENT ILLNESS: Andrew Sparks  is a 51 y.o. male with a known history of anxiety disorder, hypertension, CVA, sleep apnea, obesity hypoventilation presented to the emergency room with cough and chills for 1 week. patient also had shortness of breath. Patient currently has a tracheostomy and on oxygen at home. Patient was evaluated in the emergency room, was found to have congestion in both lungs.He also had increased secretions via tracheostomy. He was given antibiotics in the emergency room. His chest x-ray showed pulmonary congestion and heart failure changes. Hospitalist service was consulted.  PAST MEDICAL HISTORY:   Past Medical History:  Diagnosis Date  . Anxiety   . Depression   . Hypersomnia with sleep apnea   . Hypertension   . Morbid obesity (HCC)   . NSTEMI (non-ST elevated myocardial infarction) (HCC) 2005  . Obesity hypoventilation syndrome (HCC)   . Shortness of breath dyspnea   . Sleep apnea   . Stroke [facility](HCC)     PAST SURGICAL HISTORY: Past Surgical History:  Procedure Laterality Date  . TRACHEOSTOMY      SOCIAL HISTORY:  Social History  Substance Use Topics  . Smoking status: Current Every Day Smoker    Packs/day: 0.50    Years: 28.00    Types: Cigarettes  . Smokeless tobacco: Never Used  . Alcohol use 4.8 oz/week    8 Cans of beer per week    FAMILY HISTORY:  Family History  Problem Relation Age of Onset  . Heart disease Father   . Diabetes type II Mother   . Breast cancer Mother     DRUG ALLERGIES: No Known Allergies  REVIEW OF SYSTEMS:   CONSTITUTIONAL: Has fever,chills No  fatigue or weakness.  EYES: No blurred or double vision.  EARS, NOSE, AND THROAT: No tinnitus or ear pain.  RESPIRATORY: Has cough, shortness of breath,  No wheezing or hemoptysis.  CARDIOVASCULAR: No chest pain, orthopnea, edema.  GASTROINTESTINAL: No nausea, vomiting, diarrhea or abdominal pain.  GENITOURINARY: No dysuria, hematuria.  ENDOCRINE: No polyuria, nocturia,  HEMATOLOGY: No anemia, easy bruising or bleeding SKIN: No rash or lesion. MUSCULOSKELETAL: No joint pain or arthritis.   NEUROLOGIC: No tingling, numbness, weakness.  PSYCHIATRY: No anxiety or depression.   MEDICATIONS AT HOME:  Prior to Admission medications   Medication Sig Start Date End Date Taking? Authorizing Provider  ALPRAZolam (XANAX) 0.25 MG tablet Take 1 tablet (0.25 mg total) by mouth daily as needed for anxiety (panic attack). 01/18/17  Yes Clapacs, Jackquline DenmarkJohn T, MD  amiodarone (PACERONE) 200 MG tablet Take 2 tablets (400 mg total) by mouth daily. 01/18/17  Yes Adrian SaranMody, Sital, MD  aspirin EC 81 MG tablet Take 81 mg by mouth daily.   Yes [provider]  FLUoxetine (PROZAC) 20 MG capsule Take 1 capsule (20 mg total) by mouth daily. 01/18/17  Yes Clapacs, Jackquline DenmarkJohn T, MD  furosemide (LASIX) 40 MG tablet Take 1 tablet (40 mg total) by mouth daily. 12/23/14  Yes Love, Evlyn KannerPamela S, PA-C  hydrALAZINE (APRESOLINE) 100 MG tablet Take 1 tablet (100 mg total) by mouth 3 (three) times daily. 12/23/14  Yes Love, Evlyn Kanner, PA-C  hydrochlorothiazide (HYDRODIURIL) 25 MG tablet Take 25 mg by mouth daily. 08/30/15  Yes [provider]  lisinopril (PRINIVIL,ZESTRIL) 40 MG tablet Take 1 tablet (40 mg total) by mouth 2 (two) times daily. Patient taking differently: Take 40 mg by mouth daily.  12/23/14  Yes Love, Evlyn Kanner, PA-C  metoprolol (LOPRESSOR) 50 MG tablet Take 1 tablet (50 mg total) by mouth 3 (three) times daily. 12/23/14  Yes Love, Evlyn Kanner, PA-C  cloNIDine (CATAPRES) 0.1 MG tablet Take 1 tablet (0.1 mg total) by mouth 3  (three) times daily. Patient not taking: Reported on 07/17/2017 12/23/14   Love, Evlyn Kanner, PA-C      PHYSICAL EXAMINATION:   VITAL SIGNS: Blood pressure (!) 170/101, pulse 76, temperature 98.6 F (37 C), temperature source Oral, resp. rate 14, height 5\' 11"  (1.803 m), weight 117.9 kg (260 lb), SpO2 97 %.  GENERAL:  51 y.o.-year-old patient lying in the bed with no acute distress.  EYES: Pupils equal, round, reactive to light and accommodation. No scleral icterus. Extraocular muscles intact.  HEENT: Head atraumatic, normocephalic. Oropharynx and nasopharynx clear.  Tracheostomy noted. NECK:  Supple, no jugular venous distention. No thyroid enlargement, no tenderness.  LUNGS: Decreased breath sounds bilaterally, bibasilar crepitations heard. No use of accessory muscles of respiration.  CARDIOVASCULAR: S1, S2 normal. No murmurs, rubs, or gallops.  ABDOMEN: Soft, nontender, nondistended. Bowel sounds present. No organomegaly or mass.  EXTREMITIES: No pedal edema, cyanosis, or clubbing.  NEUROLOGIC: Cranial nerves II through XII are intact. Muscle strength 5/5 in all extremities. Sensation intact. Gait not checked.  PSYCHIATRIC: The patient is alert and oriented x 3.  SKIN: No obvious rash, lesion, or ulcer.   LABORATORY PANEL:   CBC  Recent Labs Lab 07/17/17 1233  WBC 4.8  HGB 11.4*  HCT 34.8*  PLT 169  MCV 90.4  MCH 29.5  MCHC 32.7  RDW 14.1   ------------------------------------------------------------------------------------------------------------------  Chemistries   Recent Labs Lab 07/17/17 1233  NA 138  K 3.4*  CL 97*  CO2 31  GLUCOSE 94  BUN 9  CREATININE 0.85  CALCIUM 8.9  MG 1.5*   ------------------------------------------------------------------------------------------------------------------ estimated creatinine clearance is 134.2 mL/min (by C-G formula based on SCr of 0.85  mg/dL). ------------------------------------------------------------------------------------------------------------------ No results for input(s): TSH, T4TOTAL, T3FREE, THYROIDAB in the last 72 hours.  Invalid input(s): FREET3   Coagulation profile No results for input(s): INR, PROTIME in the last 168 hours. ------------------------------------------------------------------------------------------------------------------- No results for input(s): DDIMER in the last 72 hours. -------------------------------------------------------------------------------------------------------------------  Cardiac Enzymes  Recent Labs Lab 07/17/17 1233  TROPONINI 0.03*   ------------------------------------------------------------------------------------------------------------------ Invalid input(s): POCBNP  ---------------------------------------------------------------------------------------------------------------  Urinalysis    Component Value Date/Time   COLORURINE YELLOW (A) 07/17/2017 1233   APPEARANCEUR CLEAR (A) 07/17/2017 1233   APPEARANCEUR Clear 12/11/2014 1147   LABSPEC 1.013 07/17/2017 1233   LABSPEC 1.020 12/11/2014 1147   PHURINE 8.0 07/17/2017 1233   GLUCOSEU NEGATIVE 07/17/2017 1233   GLUCOSEU Negative 12/11/2014 1147   HGBUR NEGATIVE 07/17/2017 1233   BILIRUBINUR NEGATIVE 07/17/2017 1233   BILIRUBINUR Negative 12/11/2014 1147   KETONESUR 5 (A) 07/17/2017 1233   PROTEINUR NEGATIVE 07/17/2017 1233   NITRITE NEGATIVE 07/17/2017 1233   LEUKOCYTESUR NEGATIVE 07/17/2017 1233   LEUKOCYTESUR Negative 12/11/2014 1147     RADIOLOGY: Dg Chest 2 View  Result Date: 07/17/2017 CLINICAL DATA:  Weakness and chest pain for the past 2-3 days. History of coronary artery disease, N STEMI, CVA, morbid obesity, current smoker. EXAM: CHEST  2 VIEW COMPARISON:  Chest x-ray of January 16, 2017 FINDINGS: The lungs are well-expanded. The interstitial markings are increased. There are  bilateral pleural effusions which are new. The cardiac silhouette is enlarged the pulmonary vascularity is engorged. The tracheostomy appliance tip projects approximately 1.5 cm below the inferior margin of the clavicular heads. IMPRESSION: CHF with pulmonary interstitial edema and bilateral pleural effusions. There has been considerable deterioration in the appearance of the chest since the previous study. Electronically Signed   By: David  [place] M.D.   On: 07/17/2017 13:12    EKG: Orders placed or performed during the hospital encounter of 07/17/17  . ED EKG  . ED EKG  . EKG 12-Lead  . EKG 12-Lead    IMPRESSION AND PLAN: 51 year old male patient with history of tracheostomy,CVA,sleep apnea, obesity hypoventilation presented to the emergency room with shortness of breath, fever, chills and cough. Admitting diagnosis 1. Acute congestive heart failure 2. Dyspnea 3. Community-acquired pneumonia 4. Sleep apnea 5. Obesity hypoventilation Treatment plan Admit patient to telemetry Diurese patient with IV Lasix 40 MG every 12 hourly Start patient on IV Rocephin and IV Zithromax antibiotic Oxygen via tracheostomy Replace potassium Cycle troponin Check echocardiogram Cardiology consultation  All the records are reviewed and case discussed with ED provider. Management plans discussed with the patient, family and they are in agreement.  CODE STATUS:FULL CODE Code Status History    Date Active Date Inactive Code Status Order ID Comments User Context   01/17/2017  8:27 AM 01/18/2017  7:09 PM Full Code [id number]  Arnaldo Natal, MD Inpatient   12/15/2014  3:47 PM 12/23/2014  2:43 PM Full Code [id number]  Love, Evlyn Kanner, PA-C Inpatient       TOTAL TIME TAKING CARE OF THIS PATIENT: 50 minutes.    Ihor Austin M.D on 07/17/2017 at 3:21 PM  Between 7am to 6pm - Pager - [phone]  After 6pm go to [email or web address] - password [email or web address] [facility]  [facility]  Office   [phone]  CC: Primary care physician; McLean-Scocuzza, Pasty Spillers, MD

## 2017-07-17 NOTE — ED Provider Notes (Signed)
[facility] Emergency Department Provider Note  ____________________________________________  Time seen: Approximately 1:26 PM  I have reviewed the triage vital signs and the nursing notes.   HISTORY  Chief Complaint Weakness and Chest Pain   HPI Andrew Sparks is a 51 y.o. male with a history of OSA status post a tracheostomy, hypertension, obesity, CAD status post an STEMI, stroke who presents for evaluation of generalized weakness and chest pain. Patient reports one week of generalized weakness, cough productive of clear sputum, body aches, nausea, chills, and intermittent episodes of chest pain. Patient reports that the chest pain is sharp, located in the left side of his chest, intermittent lasting minutes at a time. Currently 5 out of 10.Patient continues to smoke. He also endorses mild shortness of breath that is worse with exertion and lying flat. Patient has been out of his Lasix for several months. Has had mild edema of bilateral lower extremities. Denies weight gain.  Past Medical History:  Diagnosis Date  . Anxiety   . Depression   . Hypersomnia with sleep apnea   . Hypertension   . Morbid obesity (HCC)   . NSTEMI (non-ST elevated myocardial infarction) (HCC) 2005  . Obesity hypoventilation syndrome (HCC)   . Shortness of breath dyspnea   . Sleep apnea   . Stroke [facility](HCC)     Patient Active Problem List   Diagnosis Date Noted  . 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 Panic disorder 01/18/2017  . Chest pain 01/17/2017  . Tracheostomy care (HCC)   . 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 Debility 12/16/2014  . Obesity hypoventilation syndrome (HCC) 12/16/2014  . Encephalopathy pulmonary 12/15/2014    Past Surgical History:  Procedure Laterality Date  . TRACHEOSTOMY      Prior to Admission medications   Medication Sig Start Date End Date Taking? Authorizing  Provider  ALPRAZolam (XANAX) 0.25 MG tablet Take 1 tablet (0.25 mg total) by mouth daily as needed for anxiety (panic attack). 01/18/17  Yes Clapacs, Jackquline DenmarkJohn T, MD  amiodarone (PACERONE) 200 MG tablet Take 2 tablets (400 mg total) by mouth daily. 01/18/17  Yes Adrian SaranMody, Sital, MD  aspirin EC 81 MG tablet Take 81 mg by mouth daily.   Yes [provider]  FLUoxetine (PROZAC) 20 MG capsule Take 1 capsule (20 mg total) by mouth daily. 01/18/17  Yes Clapacs, Jackquline DenmarkJohn T, MD  furosemide (LASIX) 40 MG tablet Take 1 tablet (40 mg total) by mouth daily. 12/23/14  Yes Love, Evlyn KannerPamela S, PA-C  hydrALAZINE (APRESOLINE) 100 MG tablet Take 1 tablet (100 mg total) by mouth 3 (three) times daily. 12/23/14  Yes Love, Evlyn KannerPamela S, PA-C  hydrochlorothiazide (HYDRODIURIL) 25 MG tablet Take 25 mg by mouth daily. 08/30/15  Yes [provider]  lisinopril (PRINIVIL,ZESTRIL) 40 MG tablet Take 1 tablet (40 mg total) by mouth 2 (two) times daily. Patient taking differently: Take 40 mg by mouth daily.  12/23/14  Yes Love, Evlyn KannerPamela S, PA-C  metoprolol (LOPRESSOR) 50 MG tablet Take 1 tablet (50 mg total) by mouth 3 (three) times daily. 12/23/14  Yes Love, Evlyn KannerPamela S, PA-C  cloNIDine (CATAPRES) 0.1 MG tablet Take 1 tablet (0.1 mg total) by mouth 3 (three) times daily. Patient not taking: Reported on 07/17/2017 12/23/14   Jacquelynn CreeLove, Pamela S, PA-C    Allergies Patient has no known allergies.  Family History  Problem Relation Age of Onset  . Heart disease Father  Social History Social History  Substance Use Topics  . Smoking status: Current Every Day Smoker    Packs/day: 0.50    Years: 28.00    Types: Cigarettes  . Smokeless tobacco: Never Used  . Alcohol use 4.8 oz/week    8 Cans of beer per week    Review of Systems  Constitutional: Negative for fever. + Chills, generalized weakness Eyes: Negative for visual changes. ENT: Negative for sore throat. Neck: No neck pain  Cardiovascular: + chest pain. Respiratory: +  shortness of breath, cough Gastrointestinal: Negative for abdominal pain, vomiting or diarrhea. Genitourinary: Negative for dysuria. Musculoskeletal: Negative for back pain. Skin: Negative for rash. Neurological: Negative for headaches, weakness or numbness. Psych: No SI or HI  ____________________________________________   PHYSICAL EXAM:  VITAL SIGNS: ED Triage Vitals [07/17/17 1234]  Enc Vitals Group     BP (!) 167/107     Pulse Rate 86     Resp 16     Temp 98.6 F (37 C)     Temp Source Oral     SpO2 94 %     Weight      Height      Head Circumference      Peak Flow      Pain Score 5     Pain Loc      Pain Edu?      Excl. in GC?     Constitutional: Alert and oriented. Well appearing and in no apparent distress. HEENT:      Head: Normocephalic and atraumatic.         Eyes: Conjunctivae are normal. Sclera is non-icteric.       Mouth/Throat: Mucous membranes are moist.       Neck: Supple with no signs of meningismus. Cardiovascular: Regular rate and rhythm. No murmurs, gallops, or rubs. 2+ symmetrical distal pulses are present in all extremities. No JVD. Respiratory: Normal respiratory effort. Normal sats on room air, coarse rhonchi bilaterally Gastrointestinal: Soft, non tender, and non distended with positive bowel sounds. No rebound or guarding. Musculoskeletal: 1+ pitting edema b/l Neurologic: Normal speech and language. Face is symmetric. Moving all extremities. No gross focal neurologic deficits are appreciated. Skin: Skin is warm, dry and intact. No rash noted. Psychiatric: Mood and affect are normal. Speech and behavior are normal.  ____________________________________________   LABS (all labs ordered are listed, but only abnormal results are displayed)  Labs Reviewed  BASIC METABOLIC PANEL - Abnormal; Notable for the following:       Result Value   Potassium 3.4 (*)    Chloride 97 (*)    All other components within normal limits  CBC - Abnormal;  Notable for the following:    RBC 3.85 (*)    Hemoglobin 11.4 (*)    HCT 34.8 (*)    All other components within normal limits  URINALYSIS, COMPLETE (UACMP) WITH MICROSCOPIC - Abnormal; Notable for the following:    Color, Urine YELLOW (*)    APPearance CLEAR (*)    Ketones, ur 5 (*)    Squamous Epithelial / LPF 0-5 (*)    All other components within normal limits  TROPONIN I - Abnormal; Notable for the following:    Troponin I 0.03 (*)    All other components within normal limits  MAGNESIUM - Abnormal; Notable for the following:    Magnesium 1.5 (*)    All other components within normal limits  BRAIN NATRIURETIC PEPTIDE - Abnormal; Notable for the following:  B Natriuretic Peptide 397.0 (*)    All other components within normal limits  CBG MONITORING, ED   ____________________________________________  EKG  ED ECG REPORT I, Nita Sickle, the attending physician, personally viewed and interpreted this ECG.  Normal sinus rhythm, rate of 83, first-degree AV block, prolonged QTC, right axis deviation, incomplete right bundle branch block, LAFB, no ST elevations or depressions.  ____________________________________________  RADIOLOGY  CXR: CHF with pulmonary interstitial edema and bilateral pleural effusions. There has been considerable deterioration in the appearance of the chest since the previous study.  ____________________________________________   PROCEDURES  Procedure(s) performed: None Procedures Critical Care performed: yes  CRITICAL CARE Performed by: Nita Sickle  ?  Total critical care time: 35 min  Critical care time was exclusive of separately billable procedures and treating other patients.  Critical care was necessary to treat or prevent imminent or life-threatening deterioration.  Critical care was time spent personally by me on the following activities: development of treatment plan with patient and/or surrogate as well as nursing,  discussions with consultants, evaluation of patient's response to treatment, examination of patient, obtaining history from patient or surrogate, ordering and performing treatments and interventions, ordering and review of laboratory studies, ordering and review of radiographic studies, pulse oximetry and re-evaluation of patient's condition.  ____________________________________________   INITIAL IMPRESSION / ASSESSMENT AND PLAN / ED COURSE  51 y.o. male with a history of OSA status post a tracheostomy, hypertension, obesity, CAD status post an STEMI, stroke who presents for evaluation of generalized weakness, chills, cough, SOB and chest pain. Patient has normal vital signs, slightly increased work of breathing with coarse rhonchi bilaterally, patient looks slightly volume overloaded. Presentation concerning for pneumonia, COPD, CHF especially in the setting of being without Lasix for several months. We'll start patient on DuoNeb's, antibiotics, Solu-Medrol, Lasix    _________________________ 2:12 PM on 07/17/2017 -----------------------------------------  Chest x-ray concerning for significant pulmonary edema and bilateral pleural effusions. CHF vest read 46.BNP elevated at 397. Low mag of 1.5 which was supplemented IV. Patient was given Rocephin and azithromycin, duo nebs, and Solu-Medrol. He is to be admitted to the hospitalist service for CHF exacerbation and pneumonia.  Pertinent labs & imaging results that were available during my care of the patient were reviewed by me and considered in my medical decision making (see chart for details).    ____________________________________________   FINAL CLINICAL IMPRESSION(S) / ED DIAGNOSES  Final diagnoses:  Acute on chronic congestive heart failure, unspecified heart failure type (HCC)  COPD exacerbation (HCC)  Hypomagnesemia      NEW MEDICATIONS STARTED DURING THIS VISIT:  New Prescriptions   No medications on file     Note:   This document was prepared using Dragon voice recognition software and may include unintentional dictation errors.    Nita Sickle, MD 07/17/17 [phone]

## 2017-07-17 NOTE — ED Triage Notes (Addendum)
Pt to ED via EMS from home with c/o weakness and CP x2-3days, pt has hx of stroke. cbg 116 per ems.  Pt wears trach collar. PT A&Ox4, VS stable.

## 2017-07-17 NOTE — ED Notes (Signed)
REDS VEST READING= 46 CHEST RULER=5  VEST FITTING TASKS: POSTURE=sitting HEIGHT MARKER=T CENTER STRIP=alligned to right  COMMENTS:approved by [employer]

## 2017-07-17 NOTE — ED Notes (Signed)
RT called to give duoneb through trach collar

## 2017-07-17 NOTE — Progress Notes (Signed)
A new #6 cuffed Shiley proximal XLT placed in room along with other Tracheostomy supplies. Obturator in box with new trach at bedside. Suction set up for patient use as well.

## 2017-07-17 NOTE — Progress Notes (Signed)
Pharmacy Antibiotic Note  Kirke Corinrnesto Lealdore Ashers is a 51 y.o. male admitted on 07/17/2017 with pneumonia (CAP).  Pharmacy has been consulted for ceftraixone dosing.  Plan: Pt received ceftriaxone 1g IV x1 in the ED. Will order Ceftriaxone 1 g IV q24h to start tomorrow  Height: 5\' 11"  (180.3 cm) Weight: 260 lb (117.9 kg) IBW/kg (Calculated) : 75.3  Temp (24hrs), Avg:98.4 F (36.9 C), Min:98.2 F (36.8 C), Max:98.6 F (37 C)   Recent Labs Lab 07/17/17 1233  WBC 4.8  CREATININE 0.85    Estimated Creatinine Clearance: 134.2 mL/min (by C-G formula based on SCr of 0.85 mg/dL).    No Known Allergies  Antimicrobials this admission: Doxycycline x1 8/14 Ceftriaxone  8/14 >>  Azithromycin 8/14 >>  Dose adjustments this admission:   Microbiology results:  Thank you for allowing pharmacy to be a part of this patient's care.  Marty HeckWang, Auri Jahnke L 07/17/2017 4:31 PM

## 2017-07-18 LAB — ECHOCARDIOGRAM COMPLETE
Height: 71 in
Weight: 4160 oz

## 2017-07-18 LAB — BASIC METABOLIC PANEL
Anion gap: 9 (ref 5–15)
BUN: 16 mg/dL (ref 6–20)
CALCIUM: 9 mg/dL (ref 8.9–10.3)
CO2: 34 mmol/L — ABNORMAL HIGH (ref 22–32)
CREATININE: 1.08 mg/dL (ref 0.61–1.24)
Chloride: 94 mmol/L — ABNORMAL LOW (ref 101–111)
GFR calc Af Amer: >60 mL/min (ref >60)
Glucose, Bld: 105 mg/dL — ABNORMAL HIGH (ref 65–99)
POTASSIUM: 3.9 mmol/L (ref 3.5–5.1)
SODIUM: 137 mmol/L (ref 135–145)

## 2017-07-18 LAB — TROPONIN I

## 2017-07-18 LAB — HIV ANTIBODY (ROUTINE TESTING W REFLEX): HIV Screen 4th Generation wRfx: NONREACTIVE

## 2017-07-18 MED ORDER — ATORVASTATIN CALCIUM 20 MG PO TABS
80.0000 mg | ORAL_TABLET | Freq: Every day | ORAL | Status: DC
  Administered 2017-07-18: 80 mg via ORAL
  Filled 2017-07-18: qty 4

## 2017-07-18 MED ORDER — DOXYCYCLINE HYCLATE 100 MG IV SOLR
100.0000 mg | Freq: Two times a day (BID) | INTRAVENOUS | Status: DC
  Administered 2017-07-18 – 2017-07-19 (×2): 100 mg via INTRAVENOUS
  Filled 2017-07-18 (×3): qty 100

## 2017-07-18 MED ORDER — METOPROLOL TARTRATE 50 MG PO TABS
50.0000 mg | ORAL_TABLET | Freq: Three times a day (TID) | ORAL | Status: DC
  Administered 2017-07-18 (×2): 50 mg via ORAL
  Filled 2017-07-18 (×3): qty 1

## 2017-07-18 NOTE — Progress Notes (Signed)
[employer] [facility] [place] [facility]   PATIENT NAME: Andrew Sparks    MR#:  [id number]  DATE OF BIRTH:  1966-11-06  SUBJECTIVE:   Patient here due to shortness of breath and noted to have decompensated congestive heart failure, and also suspected pneumonia. Feels better this morning. No cough, fever. Shortness of breath has improved.  REVIEW OF SYSTEMS:    Review of Systems  Constitutional: Negative for chills and fever.  HENT: Negative for congestion and tinnitus.   Eyes: Negative for blurred vision and double vision.  Respiratory: Positive for shortness of breath. Negative for cough and wheezing.   Cardiovascular: Negative for chest pain, orthopnea and PND.  Gastrointestinal: Negative for abdominal pain, diarrhea, nausea and vomiting.  Genitourinary: Negative for dysuria and hematuria.  Neurological: Negative for dizziness, sensory change and focal weakness.  All other systems reviewed and are negative.   Nutrition: Heart healthy Tolerating Diet: Yes Tolerating PT: Ambulatory  DRUG ALLERGIES:  No Known Allergies  VITALS:  Blood pressure (!) 156/102, pulse 66, temperature 98.4 F (36.9 C), temperature source Oral, resp. rate 20, height 5\' 11"  (1.803 m), weight 117.9 kg (260 lb), SpO2 96 %.  PHYSICAL EXAMINATION:   Physical Exam  GENERAL:  51 y.o.-year-old patient lying in bed in no acute distress.  EYES: Pupils equal, round, reactive to light and accommodation. No scleral icterus. Extraocular muscles intact.  HEENT: Head atraumatic, normocephalic. Oropharynx and nasopharynx clear. Status post tracheostomy with trach collar in place. NECK:  Supple, no jugular venous distention. No thyroid enlargement, no tenderness.  LUNGS: Good A/E b/l, no wheezing, bibasilar rales, No rhonchi. No use of accessory muscles of respiration.  CARDIOVASCULAR: S1, S2 normal. No murmurs, rubs, or gallops.  ABDOMEN: Soft, nontender, nondistended. Bowel sounds present. No  organomegaly or mass.  EXTREMITIES: No cyanosis, clubbing or edema b/l.    NEUROLOGIC: Cranial nerves II through XII are intact. No focal Motor or sensory deficits b/l.   PSYCHIATRIC: The patient is alert and oriented x 3.  SKIN: No obvious rash, lesion, or ulcer.    LABORATORY PANEL:   CBC  Recent Labs Lab 07/17/17 1233  WBC 4.8  HGB 11.4*  HCT 34.8*  PLT 169   ------------------------------------------------------------------------------------------------------------------  Chemistries   Recent Labs Lab 07/17/17 1233 07/18/17 0328  NA 138 137  K 3.4* 3.9  CL 97* 94*  CO2 31 34*  GLUCOSE 94 105*  BUN 9 16  CREATININE 0.85 1.08  CALCIUM 8.9 9.0  MG 1.5*  --    ------------------------------------------------------------------------------------------------------------------  Cardiac Enzymes  Recent Labs Lab 07/18/17 0328  TROPONINI <0.03   ------------------------------------------------------------------------------------------------------------------  RADIOLOGY:  Dg Chest 2 View  Result Date: 07/17/2017 CLINICAL DATA:  Weakness and chest pain for the past 2-3 days. History of coronary artery disease, N STEMI, CVA, morbid obesity, current smoker. EXAM: CHEST  2 VIEW COMPARISON:  Chest x-ray of January 16, 2017 FINDINGS: The lungs are well-expanded. The interstitial markings are increased. There are bilateral pleural effusions which are new. The cardiac silhouette is enlarged the pulmonary vascularity is engorged. The tracheostomy appliance tip projects approximately 1.5 cm below the inferior margin of the clavicular heads. IMPRESSION: CHF with pulmonary interstitial edema and bilateral pleural effusions. There has been considerable deterioration in the appearance of the chest since the previous study. Electronically Signed   By: David  SwazilandJordan M.D.   On: 07/17/2017 13:12     ASSESSMENT AND PLAN:   51 year old male with past history of obstructive sleep apnea,  chronic respiratory failure status post tracheostomy, history of previous CVA, depression/anxiety, who presented to the hospital due to shortness of breath.  1. Acute on chronic respiratory failure with hypoxia-secondary to CHF/pneumonia. -Continue O2 supplementation through trach collar. Continue diuresis with IV Lasix. Clinically improving. -Continue IV antibiotics for pneumonia.  2. CHF-acute on chronic systolic dysfunction.  -continue diuresis with IV Lasix, follow I's and O's and daily weights. Clinically improved since yesterday. -Continue clonidine, hydralazine, lisinopril, Metoprolol.  3. Pneumonia - CAP.  - cont. Ceftriaxone, Doxycycline.  - improving. Afebrile, hemodynamically stable.   4. HTN - cont. Clonidine, Hydralazine, Lisinopril.   5. Hyperlipidemia - cont. Atorvastatin.   6. Depression - cont. Prozac.    All the records are reviewed and case discussed with Care Management/Social Worker. Management plans discussed with the patient, family and they are in agreement.  CODE STATUS: Full code  DVT Prophylaxis: Hep. SQ  TOTAL TIME TAKING CARE OF THIS PATIENT: 30 minutes.   POSSIBLE D/C IN 1-2 DAYS, DEPENDING ON CLINICAL CONDITION.   Houston Siren M.D on 07/18/2017 at 2:58 PM  Between 7am to 6pm - Pager - [phone]  After 6pm go to [email or web address] - Social research officer, government  [employer] [place] [facility]  Office  [phone]  CC: Primary care physician; McLean-Scocuzza, Pasty Spillers, MD

## 2017-07-18 NOTE — Plan of Care (Signed)
Problem: Skin Integrity: Goal: Risk for impaired skin integrity will decrease Outcome: Progressing Verbalized need to get OOB and walk around the unit today, prior to possible d/c. Will continue to monitor. Jari FavreSteven M [facility]

## 2017-07-18 NOTE — Progress Notes (Signed)
Patient manages own tracheostomy device/collar. Only wants tracheostomy management kit provided and states he will manage / clean device PRN. Told patient if he needs any assistance from either RN or RT to let staff know. Will continue to monitor. Wenda Low [facility]

## 2017-07-19 LAB — BASIC METABOLIC PANEL
ANION GAP: 9 (ref 5–15)
BUN: 21 mg/dL — ABNORMAL HIGH (ref 6–20)
CO2: 35 mmol/L — ABNORMAL HIGH (ref 22–32)
Calcium: 8.8 mg/dL — ABNORMAL LOW (ref 8.9–10.3)
Chloride: 94 mmol/L — ABNORMAL LOW (ref 101–111)
Creatinine, Ser: 1.08 mg/dL (ref 0.61–1.24)
GFR calc Af Amer: >60 mL/min (ref >60)
GLUCOSE: 96 mg/dL (ref 65–99)
POTASSIUM: 3.5 mmol/L (ref 3.5–5.1)
Sodium: 138 mmol/L (ref 135–145)

## 2017-07-19 MED ORDER — METOPROLOL TARTRATE 25 MG PO TABS
25.0000 mg | ORAL_TABLET | Freq: Two times a day (BID) | ORAL | Status: DC
  Administered 2017-07-19: 25 mg via ORAL

## 2017-07-19 MED ORDER — METOPROLOL TARTRATE 25 MG PO TABS: 25.0000 mg | ORAL_TABLET | Freq: Two times a day (BID) | ORAL | 0 refills | Status: DC

## 2017-07-19 MED ORDER — LISINOPRIL 10 MG PO TABS: 10.0000 mg | ORAL_TABLET | Freq: Every day | ORAL | 0 refills | Status: DC

## 2017-07-19 MED ORDER — CLONIDINE HCL 0.1 MG PO TABS: 0.1000 mg | ORAL_TABLET | Freq: Three times a day (TID) | ORAL | 0 refills | Status: DC

## 2017-07-19 MED ORDER — LEVOFLOXACIN 500 MG PO TABS: 500.0000 mg | ORAL_TABLET | Freq: Every day | ORAL | 0 refills | Status: DC

## 2017-07-19 MED ORDER — AMIODARONE HCL 200 MG PO TABS
200.0000 mg | ORAL_TABLET | Freq: Every day | ORAL | Status: DC
  Administered 2017-07-19: 200 mg via ORAL

## 2017-07-19 MED ORDER — FLUOXETINE HCL 20 MG PO CAPS: 20.0000 mg | ORAL_CAPSULE | Freq: Every day | ORAL | 0 refills | Status: DC

## 2017-07-19 MED ORDER — FUROSEMIDE 40 MG PO TABS: 40.0000 mg | ORAL_TABLET | Freq: Every day | ORAL | 0 refills | Status: DC

## 2017-07-19 MED ORDER — AMIODARONE HCL 200 MG PO TABS: 200.0000 mg | ORAL_TABLET | Freq: Every day | ORAL | 0 refills | Status: DC

## 2017-07-19 NOTE — Care Management (Signed)
Patient reports that he can obtain his meds- but can not get an appointment with his pcp due to issues with his medicaid card.Patient has chronic oxygen through [facility].  His portable self fill portable tank that is in the room is empty and his transportation home will not be able to go by the residence to obtain another tank.Informed by attending that patient "is not able to obtain his medications".   Patient says he is not able to follow up with his PCP at [facility] (Dr Desma Maximracy McLean) due to "error on his card." "They told me they fixed it but my doctor will not schedule an appointment because the card is wrong."  Patient has medicaid cards from 2016 an 2017 and PCP listed is [facility]- [address] - which is correct.  Patient does not have the copy of his 2018 card. Contact [place] [employer] and informed the [facility] which [facility].  [facility] [place] [facility] "the change had not been competed yet in the system " but was done while CM on the phone.  Patient will receive his new card Sept 1 2018.  He should be able to schedule appointment and be accepted now. Updated patient.  He has not been given Living with Heart Failure Education Booklet. updated primary nurse.  CM did not have any scales to give patient

## 2017-07-19 NOTE — Discharge Instructions (Signed)
Heart Failure Clinic appointment on July 27 2017 at 8:20am with Andrew Kindredina Elston Aldape, FNP. Please call [phone] to reschedule.

## 2017-07-19 NOTE — Discharge Summary (Signed)
[employer] - [employer] at [facility]   PATIENT NAME: Andrew Sparks    MR#:  [id number]  DATE OF BIRTH:  22-Oct-1966  DATE OF ADMISSION:  07/17/2017 ADMITTING PHYSICIAN: Ihor Austin, MD  DATE OF DISCHARGE: 07/19/2017  2:37 PM  PRIMARY CARE PHYSICIAN: McLean-Scocuzza, Pasty Spillers, MD    ADMISSION DIAGNOSIS:  Hypomagnesemia [E83.42] COPD exacerbation (HCC) [J44.1] Acute on chronic congestive heart failure, unspecified heart failure type (HCC) [I50.9]  DISCHARGE DIAGNOSIS:  Active Problems:   CHF (congestive heart failure) (HCC)   SECONDARY DIAGNOSIS:   Past Medical History:  Diagnosis Date  . Anxiety   . Depression   . Hypersomnia with sleep apnea   . Hypertension   . Morbid obesity (HCC)   . NSTEMI (non-ST elevated myocardial infarction) (HCC) 2005  . Obesity hypoventilation syndrome (HCC)   . Shortness of breath dyspnea   . Sleep apnea   . Stroke [facility])     HOSPITAL COURSE:   51 year old male with past history of obstructive sleep apnea, chronic respiratory failure status post tracheostomy, history of previous CVA, depression/anxiety, who presented to the hospital due to shortness of breath.  1. Acute on chronic respiratory failure with hypoxia- this was secondary to CHF/pneumonia. -Patient was diuresed with IV Lasix, and also given IV antibiotics for the pneumonia. He has clinically improved. He is now being discharged on his home dose of Lasix, oral Levaquin for his pneumonia. He will follow-up with his primary care physician. -he will continue local tracheostomy care  2. CHF-acute on chronic systolic dysfunction.  -patient was diuresed with IV Lasix and has clinically improved.he is about 2-3 L negative since admission. He feels better. He'll be discharged on his home dose of Lasix. He will continue his clonidine, metoprolol, hydralazine and Lisinopril  3. Pneumonia - CAP.  - while in the hospital pt. Was treated with IV Ceftriaxone, Doxycycline but  now discharged on Oral Levaquin. .  - he remained Afebrile, hemodynamically stable.   4. HTN - patient's blood pressure regimen was adjusted. He will continue his clonidine, hydralazine, his metoprolol dose was lowered and so was his lisinopril.  5. Hyperlipidemia - he will cont. Atorvastatin.   6. Depression - he will cont. Prozac.   DISCHARGE CONDITIONS:   Stable  CONSULTS OBTAINED:    DRUG ALLERGIES:  No Known Allergies  DISCHARGE MEDICATIONS:   Allergies as of 07/19/2017   No Known Allergies     Medication List    STOP taking these medications   hydrochlorothiazide 25 MG tablet Commonly known as:  HYDRODIURIL     TAKE these medications   ALPRAZolam 0.25 MG tablet Commonly known as:  XANAX Take 1 tablet (0.25 mg total) by mouth daily as needed for anxiety (panic attack).   amiodarone 200 MG tablet Commonly known as:  PACERONE Take 1 tablet (200 mg total) by mouth daily. What changed:  how much to take   aspirin EC 81 MG tablet Take 81 mg by mouth daily.   cloNIDine 0.1 MG tablet Commonly known as:  CATAPRES Take 1 tablet (0.1 mg total) by mouth 3 (three) times daily.   FLUoxetine 20 MG capsule Commonly known as:  PROZAC Take 1 capsule (20 mg total) by mouth daily.   furosemide 40 MG tablet Commonly known as:  LASIX Take 1 tablet (40 mg total) by mouth daily.   hydrALAZINE 100 MG tablet Commonly known as:  APRESOLINE Take 1 tablet (100 mg total) by mouth 3 (three) times daily.  levofloxacin 500 MG tablet Commonly known as:  LEVAQUIN Take 1 tablet (500 mg total) by mouth daily.   lisinopril 10 MG tablet Commonly known as:  PRINIVIL,ZESTRIL Take 1 tablet (10 mg total) by mouth daily. What changed:  medication strength  how much to take  when to take this   metoprolol tartrate 25 MG tablet Commonly known as:  LOPRESSOR Take 1 tablet (25 mg total) by mouth 2 (two) times daily. What changed:  medication strength  how much to  take  when to take this         DISCHARGE INSTRUCTIONS:   DIET:  Cardiac diet  DISCHARGE CONDITION:  Stable  ACTIVITY:  Activity as tolerated  OXYGEN:  Home Oxygen: Yes.     Oxygen Delivery: 2 liters/min via Trach Collar  DISCHARGE LOCATION:  home   If you experience worsening of your admission symptoms, develop shortness of breath, life threatening emergency, suicidal or homicidal thoughts you must seek medical attention immediately by calling 911 or calling your MD immediately  if symptoms less severe.  You Must read complete instructions/literature along with all the possible adverse reactions/side effects for all the Medicines you take and that have been prescribed to you. Take any new Medicines after you have completely understood and accpet all the possible adverse reactions/side effects.   Please note  You were cared for by a hospitalist during your hospital stay. If you have any questions about your discharge medications or the care you received while you were in the hospital after you are discharged, you can call the unit and asked to speak with the hospitalist on call if the hospitalist that took care of you is not available. Once you are discharged, your primary care physician will handle any further medical issues. Please note that NO REFILLS for any discharge medications will be authorized once you are discharged, as it is imperative that you return to your primary care physician (or establish a relationship with a primary care physician if you do not have one) for your aftercare needs so that they can reassess your need for medications and monitor your lab values.     Today   Shortness of breath much improved.  NO chest pain, N/V or fever. Will d/c home today.   VITAL SIGNS:  Blood pressure (!) 149/100, pulse (!) 56, temperature (!) 97.5 F (36.4 C), resp. rate 18, height 5\' 11"  (1.803 m), weight 123.2 kg (271 lb 8 oz), SpO2 100 %.  I/O:   Intake/Output  Summary (Last 24 hours) at 07/19/17 1514 Last data filed at 07/19/17 0954  Gross per 24 hour  Intake              730 ml  Output              900 ml  Net             -170 ml    PHYSICAL EXAMINATION:   GENERAL:  51 y.o.-year-old patient lying in bed in no acute distress.  EYES: Pupils equal, round, reactive to light and accommodation. No scleral icterus. Extraocular muscles intact.  HEENT: Head atraumatic, normocephalic. Oropharynx and nasopharynx clear. Status post tracheostomy with trach collar in place. NECK:  Supple, no jugular venous distention. No thyroid enlargement, no tenderness.  LUNGS: Good A/E b/l, no wheezing, bibasilar rales, No rhonchi. No use of accessory muscles of respiration.  CARDIOVASCULAR: S1, S2 normal. No murmurs, rubs, or gallops.  ABDOMEN: Soft, nontender, nondistended. Bowel sounds present. No  organomegaly or mass.  EXTREMITIES: No cyanosis, clubbing or edema b/l.    NEUROLOGIC: Cranial nerves II through XII are intact. No focal Motor or sensory deficits b/l.   PSYCHIATRIC: The patient is alert and oriented x 3.  SKIN: No obvious rash, lesion, or ulcer.    DATA REVIEW:   CBC  Recent Labs Lab 07/17/17 1233  WBC 4.8  HGB 11.4*  HCT 34.8*  PLT 169    Chemistries   Recent Labs Lab 07/17/17 1233  07/19/17 0542  NA 138  < > 138  K 3.4*  < > 3.5  CL 97*  < > 94*  CO2 31  < > 35*  GLUCOSE 94  < > 96  BUN 9  < > 21*  CREATININE 0.85  < > 1.08  CALCIUM 8.9  < > 8.8*  MG 1.5*  --   --   < > = values in this interval not displayed.  Cardiac Enzymes  Recent Labs Lab 07/18/17 0328  TROPONINI <0.03    RADIOLOGY:  No results found.    Management plans discussed with the patient, family and they are in agreement.  CODE STATUS:     Code Status Orders        Start     Ordered   07/17/17 1524  Full code  Continuous     07/17/17 1524      TOTAL TIME TAKING CARE OF THIS PATIENT: 40 minutes.    Houston Siren M.D on 07/19/2017 at  3:14 PM  Between 7am to 6pm - Pager - [phone]  After 6pm go to [email or web address] - Social research officer, government  [employer] [place] [facility]  Office  [phone]  CC: Primary care physician; McLean-Scocuzza, Pasty Spillers, MD

## 2017-07-19 NOTE — Progress Notes (Signed)
Iv and tele removed from patient. Discharge instructions given to patient along with hard copy prescriptions.  No distress at this time. Patient to call [employer] to transport home.

## 2017-07-27 ENCOUNTER — Ambulatory Visit: Payer: Medicaid Other | Admitting: Family

## 2017-07-27 ENCOUNTER — Telehealth: Payer: Self-pay | Admitting: Family

## 2017-07-27 NOTE — Telephone Encounter (Signed)
Patient missed his initial appointment [facility] on 07/27/17. Will attempt to reschedule.

## 2017-12-17 ENCOUNTER — Emergency Department
Admission: EM | Admit: 2017-12-17 | Discharge: 2017-12-17 | Disposition: A | Discharge status: Home / Self Care | Payer: Medicare Other | Attending: Emergency Medicine | Admitting: Emergency Medicine

## 2017-12-17 ENCOUNTER — Emergency Department: Payer: Medicare Other

## 2017-12-17 ENCOUNTER — Other Ambulatory Visit: Payer: Self-pay

## 2017-12-17 ENCOUNTER — Encounter: Payer: Self-pay | Admitting: Emergency Medicine

## 2017-12-17 DIAGNOSIS — I110 Hypertensive heart disease with heart failure: Secondary | ICD-10-CM | POA: Insufficient documentation

## 2017-12-17 DIAGNOSIS — I252 Old myocardial infarction: Secondary | ICD-10-CM | POA: Diagnosis not present

## 2017-12-17 DIAGNOSIS — Z79899 Other long term (current) drug therapy: Secondary | ICD-10-CM | POA: Diagnosis not present

## 2017-12-17 DIAGNOSIS — I4891 Unspecified atrial fibrillation: Secondary | ICD-10-CM | POA: Diagnosis not present

## 2017-12-17 DIAGNOSIS — Z8673 Personal history of transient ischemic attack (TIA), and cerebral infarction without residual deficits: Secondary | ICD-10-CM | POA: Insufficient documentation

## 2017-12-17 DIAGNOSIS — Z7982 Long term (current) use of aspirin: Secondary | ICD-10-CM | POA: Diagnosis not present

## 2017-12-17 DIAGNOSIS — F17210 Nicotine dependence, cigarettes, uncomplicated: Secondary | ICD-10-CM | POA: Diagnosis not present

## 2017-12-17 DIAGNOSIS — J40 Bronchitis, not specified as acute or chronic: Secondary | ICD-10-CM | POA: Diagnosis not present

## 2017-12-17 DIAGNOSIS — I509 Heart failure, unspecified: Secondary | ICD-10-CM | POA: Diagnosis not present

## 2017-12-17 DIAGNOSIS — R0602 Shortness of breath: Secondary | ICD-10-CM | POA: Diagnosis present

## 2017-12-17 HISTORY — DX: Heart failure, unspecified: I50.9

## 2017-12-17 LAB — CBC
HCT: 43.3 % (ref 40.0–52.0)
Hemoglobin: 14.7 g/dL (ref 13.0–18.0)
MCH: 33 pg (ref 26.0–34.0)
MCHC: 33.9 g/dL (ref 32.0–36.0)
MCV: 97.3 fL (ref 80.0–100.0)
Platelets: 163 10*3/uL (ref 150–440)
RBC: 4.45 MIL/uL (ref 4.40–5.90)
RDW: 18.1 % — ABNORMAL HIGH (ref 11.5–14.5)
WBC: 4.1 10*3/uL (ref 3.8–10.6)

## 2017-12-17 LAB — BASIC METABOLIC PANEL
Anion gap: 15 (ref 5–15)
BUN: 14 mg/dL (ref 6–20)
CO2: 30 mmol/L (ref 22–32)
Calcium: 9.6 mg/dL (ref 8.9–10.3)
Chloride: 90 mmol/L — ABNORMAL LOW (ref 101–111)
Creatinine, Ser: 1.02 mg/dL (ref 0.61–1.24)
GFR calc Af Amer: >60 mL/min (ref >60)
GLUCOSE: 112 mg/dL — AB (ref 65–99)
POTASSIUM: 3.7 mmol/L (ref 3.5–5.1)
SODIUM: 135 mmol/L (ref 135–145)

## 2017-12-17 LAB — TROPONIN I
TROPONIN I: 0.04 ng/mL — AB (ref <0.03)
TROPONIN I: 0.04 ng/mL — AB (ref <0.03)

## 2017-12-17 MED ORDER — IPRATROPIUM-ALBUTEROL 0.5-2.5 (3) MG/3ML IN SOLN
3.0000 mL | Freq: Once | RESPIRATORY_TRACT | Status: AC
  Administered 2017-12-17: 3 mL via RESPIRATORY_TRACT
  Filled 2017-12-17: qty 3

## 2017-12-17 MED ORDER — ALPRAZOLAM 0.5 MG PO TABS
ORAL_TABLET | ORAL | Status: AC
  Filled 2017-12-17: qty 1

## 2017-12-17 MED ORDER — IPRATROPIUM-ALBUTEROL 0.5-2.5 (3) MG/3ML IN SOLN
3.0000 mL | Freq: Once | RESPIRATORY_TRACT | Status: AC
  Administered 2017-12-17: 3 mL via RESPIRATORY_TRACT

## 2017-12-17 MED ORDER — ALBUTEROL SULFATE HFA 108 (90 BASE) MCG/ACT IN AERS: 2.0000 | INHALATION_SPRAY | Freq: Four times a day (QID) | RESPIRATORY_TRACT | 0 refills | Status: DC | PRN

## 2017-12-17 MED ORDER — IPRATROPIUM-ALBUTEROL 0.5-2.5 (3) MG/3ML IN SOLN
RESPIRATORY_TRACT | Status: AC
  Filled 2017-12-17: qty 3

## 2017-12-17 MED ORDER — AZITHROMYCIN 500 MG PO TABS
500.0000 mg | ORAL_TABLET | Freq: Once | ORAL | Status: AC
  Administered 2017-12-17: 500 mg via ORAL
  Filled 2017-12-17: qty 1

## 2017-12-17 MED ORDER — PREDNISONE 10 MG PO TABS: ORAL_TABLET | ORAL | 0 refills | Status: DC

## 2017-12-17 MED ORDER — PREDNISONE 20 MG PO TABS
40.0000 mg | ORAL_TABLET | Freq: Once | ORAL | Status: AC
  Administered 2017-12-17: 40 mg via ORAL
  Filled 2017-12-17: qty 2

## 2017-12-17 MED ORDER — AZITHROMYCIN 250 MG PO TABS: ORAL_TABLET | ORAL | 0 refills | Status: DC

## 2017-12-17 MED ORDER — ALPRAZOLAM 0.5 MG PO TABS
0.5000 mg | ORAL_TABLET | Freq: Once | ORAL | Status: AC
  Administered 2017-12-17: 0.5 mg via ORAL

## 2017-12-17 NOTE — ED Notes (Signed)
Per Dr. Shaune PollackLord, d/c if 2nd troponin negative. No IV needed at this time.

## 2017-12-17 NOTE — Discharge Instructions (Signed)
You are evaluated for trouble breathing and are being treated for bronchitis.  You are also found to have irregular heartbeat called atrial fibrillation.  Please follow-up with your primary care doctor as well as her cardiologist this week.  Return to the emergency department immediately for any worsening condition including trouble breathing, fever, chest pain, dizziness passing out, altered mental status, or any other symptoms concerning to you.

## 2017-12-17 NOTE — ED Triage Notes (Signed)
Arrives with C/O SOB x 2-3 days.   Denies fever.  Patient has #6 Shiley trach in place and wears 2L/ TC home o2.

## 2017-12-17 NOTE — ED Notes (Signed)
Pt denies any chest pain at this time. Requesting duoneb and "low dose of xanax" prior to discharge.

## 2017-12-17 NOTE — Progress Notes (Signed)
Pt. Suctioned for small amt. Thin clear secretions.Inner cannula was replaced in trach.

## 2017-12-17 NOTE — ED Provider Notes (Signed)
[facility] Emergency Department Provider Note ____________________________________________   I have reviewed the triage vital signs and the triage nursing note.  HISTORY  Chief Complaint Shortness of Breath   Historian Patient  HPI Andrew Sparks is a 52 y.o. male with CHF, questionable whether or not he has a diagnosis of COPD or not, but looks like he had a hospitalization recently and was treated for COPD exacerbation, in any case he does have obesity hypoventilation syndrome with sleep apnea and has a trach for that, presents with trouble breathing for a day or 2.  He has had increased congestion.  No fever.  No vomiting.  No chest pain.  He wears home O2 2 L across his trach.  History of CHF although he states no known history of irregular heartbeat or A. fib.  He does take Xarelto.  Follows with Dr. Welton FlakesKhan with cardiology.   Past Medical History:  Diagnosis Date  . Anxiety   . CHF (congestive heart failure) (HCC)   . Depression   . Hypersomnia with sleep apnea   . Hypertension   . Morbid obesity (HCC)   . NSTEMI (non-ST elevated myocardial infarction) (HCC) 2005  . Obesity hypoventilation syndrome (HCC)   . Shortness of breath dyspnea   . Sleep apnea   . Stroke [facility](HCC)     Patient Active Problem List   Diagnosis Date Noted  . CHF (congestive heart failure) (HCC) 07/17/2017  . 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 Panic disorder 01/18/2017  . Chest pain 01/17/2017  . Tracheostomy care (HCC)   . 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 Debility 12/16/2014  . Obesity hypoventilation syndrome (HCC) 12/16/2014  . Encephalopathy pulmonary 12/15/2014    Past Surgical History:  Procedure Laterality Date  . TRACHEOSTOMY      Prior to Admission medications   Medication Sig Start Date End Date Taking? Authorizing Provider  albuterol (PROVENTIL HFA;VENTOLIN HFA) 108 (90  Base) MCG/ACT inhaler Inhale 2 puffs into the lungs every 6 (six) hours as needed for wheezing or shortness of breath. 12/17/17   Governor RooksLord, Gwendolen Hewlett, MD  ALPRAZolam Prudy Feeler(XANAX) 0.25 MG tablet Take 1 tablet (0.25 mg total) by mouth daily as needed for anxiety (panic attack). 01/18/17   Clapacs, Jackquline DenmarkJohn T, MD  amiodarone (PACERONE) 200 MG tablet Take 1 tablet (200 mg total) by mouth daily. 07/19/17   Houston SirenSainani, Vivek J, MD  aspirin EC 81 MG tablet Take 81 mg by mouth daily.    [provider]  azithromycin (ZITHROMAX) 250 MG tablet One tab daily for 4 more days 12/17/17   Governor RooksLord, Raiana Pharris, MD  cloNIDine (CATAPRES) 0.1 MG tablet Take 1 tablet (0.1 mg total) by mouth 3 (three) times daily. 07/19/17   Houston SirenSainani, Vivek J, MD  FLUoxetine (PROZAC) 20 MG capsule Take 1 capsule (20 mg total) by mouth daily. 07/19/17   Houston SirenSainani, Vivek J, MD  furosemide (LASIX) 40 MG tablet Take 1 tablet (40 mg total) by mouth daily. 07/19/17   Houston SirenSainani, Vivek J, MD  hydrALAZINE (APRESOLINE) 100 MG tablet Take 1 tablet (100 mg total) by mouth 3 (three) times daily. 12/23/14   Love, Evlyn KannerPamela S, PA-C  levofloxacin (LEVAQUIN) 500 MG tablet Take 1 tablet (500 mg total) by mouth daily. 07/19/17   Houston SirenSainani, Vivek J, MD  lisinopril (PRINIVIL,ZESTRIL) 10 MG tablet Take 1 tablet (10 mg total) by mouth daily. 07/19/17   Houston SirenSainani, Vivek J, MD  metoprolol tartrate (LOPRESSOR) 25  MG tablet Take 1 tablet (25 mg total) by mouth 2 (two) times daily. 07/19/17   Houston Siren, MD  predniSONE (DELTASONE) 10 MG tablet 40mg  daily for 4 more days 12/17/17   Governor Rooks, MD    No Known Allergies  Family History  Problem Relation Age of Onset  . Heart disease Father   . Diabetes type II Mother   . Breast cancer Mother     Social History Social History   Tobacco Use  . Smoking status: Current Every Day Smoker    Packs/day: 0.50    Years: 28.00    Pack years: 14.00    Types: Cigarettes  . Smokeless tobacco: Never Used  Substance Use Topics  . Alcohol use:  Yes    Alcohol/week: 4.8 oz    Types: 8 Cans of beer per week  . Drug use: No    Review of Systems  Constitutional: Negative for fever. Eyes: Negative for visual changes. ENT: Negative for sore throat. Cardiovascular: Negative for chest pain. Respiratory: Positive for mild cough with mild to moderate shortness of breath. Gastrointestinal: Negative for abdominal pain, vomiting and diarrhea. Genitourinary: Negative for dysuria. Musculoskeletal: Negative for back pain. Skin: Negative for rash. Neurological: Negative for headache.  ____________________________________________   PHYSICAL EXAM:  VITAL SIGNS: ED Triage Vitals  Enc Vitals Group     BP 12/17/17 0756 (!) 197/123     Pulse Rate 12/17/17 0756 78     Resp 12/17/17 0756 18     Temp 12/17/17 0756 98.5 F (36.9 C)     Temp Source 12/17/17 0756 Oral     SpO2 12/17/17 0756 93 %     Weight 12/17/17 0754 270 lb (122.5 kg)     Height 12/17/17 0754 5\' 10"  (1.778 m)     Head Circumference --      Peak Flow --      Pain Score 12/17/17 0754 0     Pain Loc --      Pain Edu? --      Excl. in GC? --      Constitutional: Alert and oriented. Well appearing overall and in no distress. HEENT   Head: Normocephalic and atraumatic.      Eyes: Conjunctivae are normal. Pupils equal and round.       Ears:         Nose: No congestion/rhinnorhea.   Mouth/Throat: Mucous membranes are moist.   Neck: No stridor.  Trach in place with some light yellow mucus Cardiovascular/Chest: Normal rate, irregularly irregular rhythm.  No murmurs, rubs, or gallops. Respiratory: Normal respiratory effort without tachypnea nor retractions.  Mild decreased air movement throughout, moderate rhonchi, mild end expiratory wheeze. Gastrointestinal: Soft. No distention, no guarding, no rebound. Nontender.  Obese Genitourinary/rectal:Deferred Musculoskeletal: Nontender with normal range of motion in all extremities. No joint effusions.  No lower  extremity tenderness.  No edema. Neurologic:  Normal speech and language. No gross or focal neurologic deficits are appreciated. Skin:  Skin is warm, dry and intact. No rash noted. Psychiatric: Mood and affect are normal. Speech and behavior are normal. Patient exhibits appropriate insight and judgment.   ____________________________________________  LABS (pertinent positives/negatives) I, Governor Rooks, MD the attending physician have reviewed the labs noted below.  Labs Reviewed  BASIC METABOLIC PANEL - Abnormal; Notable for the following components:      Result Value   Chloride 90 (*)    Glucose, Bld 112 (*)    All other components within normal limits  CBC - Abnormal; Notable for the following components:   RDW 18.1 (*)    All other components within normal limits  TROPONIN I - Abnormal; Notable for the following components:   Troponin I 0.04 (*)    All other components within normal limits  TROPONIN I - Abnormal; Notable for the following components:   Troponin I 0.04 (*)    All other components within normal limits    ____________________________________________    EKG I, Governor Rooks, MD, the attending physician have personally viewed and interpreted all ECGs.  68 bpm.  Atrial fibrillation.  Left axis deviation.  Nonspecific T wave ____________________________________________  RADIOLOGY All Xrays were viewed by me.  Imaging interpreted by Radiologist, and I, Governor Rooks, MD the attending physician have reviewed the radiologist interpretation noted below.  Chest x-ray:  IMPRESSION: Cardiomegaly.  Mild central pulmonary vascular prominence without pulmonary edema.  Tracheostomy tube tip 6 cm above the carina.  Slightly tortuous aorta. __________________________________________  PROCEDURES  Procedure(s) performed: None  Critical Care performed: None   ____________________________________________  ED COURSE / ASSESSMENT AND PLAN  Pertinent labs &  imaging results that were available during my care of the patient were reviewed by me and considered in my medical decision making (see chart for details).   Patient is overall well-appearing, had been placed up to 4 L due to subjective shortness of breath and patient was able to place back to his home 2 L nasal cannula and holding his oxygenation.  He has some decreased air movement and tightness which I suspect is probably some bronchospasm.  Unclear to me and he states that he has not been formally diagnosed with COPD, so this may be bronchospasm associate with bronchitis.  He is not reporting increased fluid on his legs or weight gain, and chest x-ray does not show significant new fluid, is not hypoxic to baseline, I do not have a high suspicion of C. difficile exacerbation.  There is no pneumonia chest x-ray.  He is not reporting fevers or flulike symptoms.   Patient had an episode where he started coughing a little diaphoretic.  I suspect mucus plugging, patient was able to clear and feel better.  He states he got a little anxious.  I will given as needed Xanax here, states he is in the past.  I recommend prescribed Xanax.  Patient okay for discharge home.  We discussed close follow-up with primary care doctor, treatment for bronchitis, and also to follow-up with his cardiologist given the new paroxysmal A. fib found today.  He is rate controlled here, and he is already on Xarelto.  Symptoms do not seem consistent with PE or ACS.  Repeat troponin was stable and unchanged at 0.04.  DIFFERENTIAL DIAGNOSIS: Differential includes, but is not limited to, viral syndrome, bronchitis including COPD exacerbation, pneumonia, reactive airway disease including asthma, CHF including exacerbation with or without pulmonary/interstitial edema, pneumothorax, ACS, thoracic trauma, and pulmonary embolism.  CONSULTATIONS:  None  Patient / Family / Caregiver informed of clinical course, medical decision-making  process, and agree with plan.   I discussed return precautions, follow-up instructions, and discharge instructions with patient and/or family.  Discharge Instructions :  You are evaluated for trouble breathing and are being treated for bronchitis.  You are also found to have irregular heartbeat called atrial fibrillation.  Please follow-up with your primary care doctor as well as her cardiologist this week.  Return to the emergency department immediately for any worsening condition including trouble breathing, fever, chest  pain, dizziness passing out, altered mental status, or any other symptoms concerning to you.    ___________________________________________   FINAL CLINICAL IMPRESSION(S) / ED DIAGNOSES   Final diagnoses:  Atrial fibrillation, unspecified type (HCC)  Bronchitis      ___________________________________________        Note: This dictation was prepared with Dragon dictation. Any transcriptional errors that result from this process are unintentional    Governor Rooks, MD 12/17/17 1228

## 2017-12-17 NOTE — Progress Notes (Signed)
Pt. Suctioned for small amt. Thin clear secretions.

## 2018-03-26 ENCOUNTER — Inpatient Hospital Stay
Admission: EM | Admit: 2018-03-26 | Discharge: 2018-03-29 | DRG: 683 | Disposition: A | Discharge status: Home / Self Care | Payer: Medicare Other | Attending: Specialist | Admitting: Specialist

## 2018-03-26 ENCOUNTER — Encounter: Payer: Self-pay | Admitting: Emergency Medicine

## 2018-03-26 ENCOUNTER — Other Ambulatory Visit: Payer: Self-pay

## 2018-03-26 ENCOUNTER — Emergency Department: Payer: Medicare Other

## 2018-03-26 ENCOUNTER — Inpatient Hospital Stay: Payer: Medicare Other

## 2018-03-26 DIAGNOSIS — K420 Umbilical hernia with obstruction, without gangrene: Secondary | ICD-10-CM | POA: Diagnosis not present

## 2018-03-26 DIAGNOSIS — Z8673 Personal history of transient ischemic attack (TIA), and cerebral infarction without residual deficits: Secondary | ICD-10-CM | POA: Diagnosis not present

## 2018-03-26 DIAGNOSIS — E860 Dehydration: Secondary | ICD-10-CM | POA: Diagnosis present

## 2018-03-26 DIAGNOSIS — N19 Unspecified kidney failure: Secondary | ICD-10-CM

## 2018-03-26 DIAGNOSIS — I252 Old myocardial infarction: Secondary | ICD-10-CM

## 2018-03-26 DIAGNOSIS — K429 Umbilical hernia without obstruction or gangrene: Secondary | ICD-10-CM | POA: Diagnosis present

## 2018-03-26 DIAGNOSIS — E662 Morbid (severe) obesity with alveolar hypoventilation: Secondary | ICD-10-CM | POA: Diagnosis present

## 2018-03-26 DIAGNOSIS — I4891 Unspecified atrial fibrillation: Secondary | ICD-10-CM | POA: Diagnosis present

## 2018-03-26 DIAGNOSIS — E876 Hypokalemia: Secondary | ICD-10-CM | POA: Diagnosis present

## 2018-03-26 DIAGNOSIS — Z7982 Long term (current) use of aspirin: Secondary | ICD-10-CM | POA: Diagnosis not present

## 2018-03-26 DIAGNOSIS — N179 Acute kidney failure, unspecified: Secondary | ICD-10-CM | POA: Diagnosis present

## 2018-03-26 DIAGNOSIS — I110 Hypertensive heart disease with heart failure: Secondary | ICD-10-CM | POA: Diagnosis present

## 2018-03-26 DIAGNOSIS — F1010 Alcohol abuse, uncomplicated: Secondary | ICD-10-CM | POA: Diagnosis present

## 2018-03-26 DIAGNOSIS — Z79899 Other long term (current) drug therapy: Secondary | ICD-10-CM | POA: Diagnosis not present

## 2018-03-26 DIAGNOSIS — F17210 Nicotine dependence, cigarettes, uncomplicated: Secondary | ICD-10-CM | POA: Diagnosis present

## 2018-03-26 DIAGNOSIS — Z930 Tracheostomy status: Secondary | ICD-10-CM

## 2018-03-26 DIAGNOSIS — I5032 Chronic diastolic (congestive) heart failure: Secondary | ICD-10-CM | POA: Diagnosis present

## 2018-03-26 DIAGNOSIS — N170 Acute kidney failure with tubular necrosis: Secondary | ICD-10-CM | POA: Diagnosis present

## 2018-03-26 DIAGNOSIS — E871 Hypo-osmolality and hyponatremia: Secondary | ICD-10-CM | POA: Diagnosis present

## 2018-03-26 DIAGNOSIS — R112 Nausea with vomiting, unspecified: Secondary | ICD-10-CM

## 2018-03-26 DIAGNOSIS — Z6841 Body Mass Index (BMI) 40.0 and over, adult: Secondary | ICD-10-CM | POA: Diagnosis not present

## 2018-03-26 DIAGNOSIS — J9610 Chronic respiratory failure, unspecified whether with hypoxia or hypercapnia: Secondary | ICD-10-CM | POA: Diagnosis present

## 2018-03-26 HISTORY — DX: Ventricular tachycardia, unspecified: I47.20

## 2018-03-26 HISTORY — DX: Ventricular tachycardia: I47.2

## 2018-03-26 LAB — CBC
HCT: 46 % (ref 40.0–52.0)
HEMOGLOBIN: 15.9 g/dL (ref 13.0–18.0)
MCH: 33.1 pg (ref 26.0–34.0)
MCHC: 34.5 g/dL (ref 32.0–36.0)
MCV: 96 fL (ref 80.0–100.0)
Platelets: 314 10*3/uL (ref 150–440)
RBC: 4.79 MIL/uL (ref 4.40–5.90)
RDW: 14.5 % (ref 11.5–14.5)
WBC: 4.5 10*3/uL (ref 3.8–10.6)

## 2018-03-26 LAB — COMPREHENSIVE METABOLIC PANEL
ALT: 31 U/L (ref 17–63)
ANION GAP: 16 — AB (ref 5–15)
AST: 49 U/L — ABNORMAL HIGH (ref 15–41)
Albumin: 4.3 g/dL (ref 3.5–5.0)
Alkaline Phosphatase: 85 U/L (ref 38–126)
BUN: 97 mg/dL — ABNORMAL HIGH (ref 6–20)
CHLORIDE: 86 mmol/L — AB (ref 101–111)
CO2: 30 mmol/L (ref 22–32)
CREATININE: 5.72 mg/dL — AB (ref 0.61–1.24)
Calcium: 10 mg/dL (ref 8.9–10.3)
GFR calc non Af Amer: 10 mL/min — ABNORMAL LOW (ref >60)
GFR, EST AFRICAN AMERICAN: 12 mL/min — AB (ref >60)
Glucose, Bld: 111 mg/dL — ABNORMAL HIGH (ref 65–99)
Potassium: 3.9 mmol/L (ref 3.5–5.1)
SODIUM: 132 mmol/L — AB (ref 135–145)
Total Bilirubin: 1.3 mg/dL — ABNORMAL HIGH (ref 0.3–1.2)
Total Protein: 8.4 g/dL — ABNORMAL HIGH (ref 6.5–8.1)

## 2018-03-26 LAB — LIPASE, BLOOD: LIPASE: 65 U/L — AB (ref 11–51)

## 2018-03-26 LAB — TROPONIN I: Troponin I: 0.05 ng/mL (ref <0.03)

## 2018-03-26 LAB — CREATININE, SERUM
Creatinine, Ser: 5.67 mg/dL — ABNORMAL HIGH (ref 0.61–1.24)
GFR calc Af Amer: 12 mL/min — ABNORMAL LOW
GFR calc non Af Amer: 10 mL/min — ABNORMAL LOW

## 2018-03-26 MED ORDER — SODIUM CHLORIDE 0.9 % IV BOLUS
500.0000 mL | Freq: Once | INTRAVENOUS | Status: AC
  Administered 2018-03-26: 500 mL via INTRAVENOUS

## 2018-03-26 MED ORDER — ALBUTEROL SULFATE (2.5 MG/3ML) 0.083% IN NEBU: 2.5000 mg | INHALATION_SOLUTION | RESPIRATORY_TRACT | Status: DC | PRN

## 2018-03-26 MED ORDER — ALBUTEROL SULFATE (2.5 MG/3ML) 0.083% IN NEBU
2.5000 mg | INHALATION_SOLUTION | Freq: Once | RESPIRATORY_TRACT | Status: AC
  Administered 2018-03-26: 2.5 mg via RESPIRATORY_TRACT
  Filled 2018-03-26: qty 3

## 2018-03-26 MED ORDER — FOLIC ACID 1 MG PO TABS
1.0000 mg | ORAL_TABLET | Freq: Every day | ORAL | Status: DC
  Administered 2018-03-26 – 2018-03-29 (×4): 1 mg via ORAL
  Filled 2018-03-26 (×4): qty 1

## 2018-03-26 MED ORDER — HYDROCODONE-ACETAMINOPHEN 5-325 MG PO TABS
1.0000 | ORAL_TABLET | ORAL | Status: DC | PRN
  Administered 2018-03-27: 1 via ORAL
  Administered 2018-03-27 – 2018-03-28 (×2): 2 via ORAL
  Administered 2018-03-28: 1 via ORAL
  Filled 2018-03-26: qty 2
  Filled 2018-03-26: qty 1
  Filled 2018-03-26: qty 2
  Filled 2018-03-26: qty 1

## 2018-03-26 MED ORDER — VITAMIN B-1 100 MG PO TABS
100.0000 mg | ORAL_TABLET | Freq: Every day | ORAL | Status: DC
  Administered 2018-03-26 – 2018-03-29 (×4): 100 mg via ORAL
  Filled 2018-03-26 (×4): qty 1

## 2018-03-26 MED ORDER — NICOTINE 14 MG/24HR TD PT24
14.0000 mg | MEDICATED_PATCH | Freq: Every day | TRANSDERMAL | Status: DC
  Administered 2018-03-26 – 2018-03-29 (×4): 14 mg via TRANSDERMAL
  Filled 2018-03-26 (×5): qty 1

## 2018-03-26 MED ORDER — BISACODYL 5 MG PO TBEC: 5.0000 mg | DELAYED_RELEASE_TABLET | Freq: Every day | ORAL | Status: DC | PRN

## 2018-03-26 MED ORDER — ACETAMINOPHEN 650 MG RE SUPP: 650.0000 mg | Freq: Four times a day (QID) | RECTAL | Status: DC | PRN

## 2018-03-26 MED ORDER — LORAZEPAM 1 MG PO TABS: 1.0000 mg | ORAL_TABLET | Freq: Four times a day (QID) | ORAL | Status: DC | PRN

## 2018-03-26 MED ORDER — HEPARIN SODIUM (PORCINE) 5000 UNIT/ML IJ SOLN
5000.0000 [IU] | Freq: Three times a day (TID) | INTRAMUSCULAR | Status: DC
  Administered 2018-03-26 – 2018-03-29 (×8): 5000 [IU] via SUBCUTANEOUS
  Filled 2018-03-26 (×8): qty 1

## 2018-03-26 MED ORDER — ONDANSETRON HCL 4 MG/2ML IJ SOLN
4.0000 mg | Freq: Once | INTRAMUSCULAR | Status: AC
  Administered 2018-03-26: 4 mg via INTRAVENOUS
  Filled 2018-03-26: qty 2

## 2018-03-26 MED ORDER — LORAZEPAM 2 MG/ML IJ SOLN: 1.0000 mg | Freq: Four times a day (QID) | INTRAMUSCULAR | Status: DC | PRN

## 2018-03-26 MED ORDER — ONDANSETRON HCL 4 MG PO TABS: 4.0000 mg | ORAL_TABLET | Freq: Four times a day (QID) | ORAL | Status: DC | PRN

## 2018-03-26 MED ORDER — SODIUM CHLORIDE 0.9 % IV SOLN
INTRAVENOUS | Status: DC
  Administered 2018-03-26 – 2018-03-29 (×6): via INTRAVENOUS

## 2018-03-26 MED ORDER — THIAMINE HCL 100 MG/ML IJ SOLN
100.0000 mg | Freq: Every day | INTRAMUSCULAR | Status: DC
  Filled 2018-03-26: qty 2

## 2018-03-26 MED ORDER — ONDANSETRON HCL 4 MG/2ML IJ SOLN: 4.0000 mg | Freq: Four times a day (QID) | INTRAMUSCULAR | Status: DC | PRN

## 2018-03-26 MED ORDER — ADULT MULTIVITAMIN W/MINERALS CH
1.0000 | ORAL_TABLET | Freq: Every day | ORAL | Status: DC
  Administered 2018-03-26 – 2018-03-29 (×4): 1 via ORAL
  Filled 2018-03-26 (×4): qty 1

## 2018-03-26 MED ORDER — SENNOSIDES-DOCUSATE SODIUM 8.6-50 MG PO TABS: 1.0000 | ORAL_TABLET | Freq: Every evening | ORAL | Status: DC | PRN

## 2018-03-26 MED ORDER — ACETAMINOPHEN 325 MG PO TABS: 650.0000 mg | ORAL_TABLET | Freq: Four times a day (QID) | ORAL | Status: DC | PRN

## 2018-03-26 MED ORDER — SODIUM CHLORIDE 0.9 % IV BOLUS
1000.0000 mL | Freq: Once | INTRAVENOUS | Status: AC
  Administered 2018-03-26: 1000 mL via INTRAVENOUS

## 2018-03-26 NOTE — Progress Notes (Signed)
Pt has $1440 dollars in his wallet that is at the bedside. Pt refuses to have security lock up money at this time. Pt verbalizes understanding that [place] is not responsible from keeping up with his money.  [place] Sparks [place]

## 2018-03-26 NOTE — ED Notes (Signed)
RRT at bedside to suction pt's trach.

## 2018-03-26 NOTE — ED Notes (Signed)
Admitting MD at bedside.

## 2018-03-26 NOTE — ED Notes (Signed)
Patient transported to CT 

## 2018-03-26 NOTE — ED Notes (Signed)
Genelle BalBrett EDT to transport pt from CT to 2C-214. Lauren, RN aware that we are bringing him up at this time and she is waiting for him.

## 2018-03-26 NOTE — H&P (Addendum)
[employer] [facility] [place] [facility]   PATIENT NAME: Andrew Sparks    MR#:  [id number]  DATE OF BIRTH:  09-09-1966  DATE OF ADMISSION:  03/26/2018  PRIMARY CARE PHYSICIAN: [name], MD   REQUESTING/REFERRING PHYSICIAN:Schaevitz, Myra Rude, MD  CHIEF COMPLAINT:   Chief Complaint  Patient presents with  . Emesis   Emesis for 3 months. HISTORY OF PRESENT ILLNESS:  Andrew Sparks  is a 52 y.o. male with a known history of chronic diastolic CHF, sleep apnea, hypertension, morbid obesity, stroke, MI, anxiety and depression.  The patient presents the ED with above chief complaints.  He has had intermittent nausea and vomiting for the past 3 months.  He only complains of mild abdominal pain, but denies any diarrhea or constipation, no melena or bloody stool.  He denies any urination problem.  He was found acute renal failure with creatinine increased to 5.72 which was normal 3 months ago.  PAST MEDICAL HISTORY:   Past Medical History:  Diagnosis Date  . Anxiety   . CHF (congestive heart failure) (HCC)   . Depression   . Hypersomnia with sleep apnea   . Hypertension   . Morbid obesity (HCC)   . NSTEMI (non-ST elevated myocardial infarction) (HCC) 2005  . Obesity hypoventilation syndrome (HCC)   . Shortness of breath dyspnea   . Sleep apnea   . Stroke (HCC)   . V-tach [facility])     PAST SURGICAL HISTORY:   Past Surgical History:  Procedure Laterality Date  . TRACHEOSTOMY      SOCIAL HISTORY:   Social History   Tobacco Use  . Smoking status: Current Every Day Smoker    Packs/day: 0.50    Years: 28.00    Pack years: 14.00    Types: Cigarettes  . Smokeless tobacco: Never Used  Substance Use Topics  . Alcohol use: Yes    Alcohol/week: 4.8 oz    Types: 8 Cans of beer per week    FAMILY HISTORY:   Family History  Problem Relation Age of Onset  . Heart disease Father   . Diabetes type II Mother   . Breast cancer Mother     DRUG  ALLERGIES:  No Known Allergies  REVIEW OF SYSTEMS:   Review of Systems  Constitutional: Positive for malaise/fatigue. Negative for chills and fever.  HENT: Negative for sore throat.   Eyes: Negative for blurred vision and double vision.  Respiratory: Negative for cough, hemoptysis, shortness of breath, wheezing and stridor.   Cardiovascular: Negative for chest pain, palpitations, orthopnea and leg swelling.  Gastrointestinal: Positive for nausea and vomiting. Negative for abdominal pain, blood in stool, diarrhea and melena.  Genitourinary: Negative for dysuria, flank pain and hematuria.  Musculoskeletal: Negative for back pain and joint pain.  Skin: Negative for rash.  Neurological: Negative for dizziness, sensory change, focal weakness, seizures, loss of consciousness, weakness and headaches.  Endo/Heme/Allergies: Negative for polydipsia.  Psychiatric/Behavioral: Negative for depression. The patient is not nervous/anxious.     MEDICATIONS AT HOME:   Prior to Admission medications   Medication Sig Start Date End Date Taking? Authorizing Provider  albuterol (PROVENTIL HFA;VENTOLIN HFA) 108 (90 Base) MCG/ACT inhaler Inhale 2 puffs into the lungs every 6 (six) hours as needed for wheezing or shortness of breath. 12/17/17   Governor Rooks, MD  ALPRAZolam Prudy Feeler) 0.25 MG tablet Take 1 tablet (0.25 mg total) by mouth daily as needed for anxiety (panic attack). 01/18/17   Clapacs, Jackquline Denmark,  MD  amiodarone (PACERONE) 200 MG tablet Take 1 tablet (200 mg total) by mouth daily. 07/19/17   Houston SirenSainani, Vivek J, MD  aspirin EC 81 MG tablet Take 81 mg by mouth daily.    [provider]  azithromycin (ZITHROMAX) 250 MG tablet One tab daily for 4 more days 12/17/17   Governor RooksLord, Rebecca, MD  cloNIDine (CATAPRES) 0.1 MG tablet Take 1 tablet (0.1 mg total) by mouth 3 (three) times daily. 07/19/17   Houston SirenSainani, Vivek J, MD  FLUoxetine (PROZAC) 20 MG capsule Take 1 capsule (20 mg total) by mouth daily. 07/19/17    Houston SirenSainani, Vivek J, MD  furosemide (LASIX) 40 MG tablet Take 1 tablet (40 mg total) by mouth daily. 07/19/17   Houston SirenSainani, Vivek J, MD  hydrALAZINE (APRESOLINE) 100 MG tablet Take 1 tablet (100 mg total) by mouth 3 (three) times daily. 12/23/14   Love, Evlyn KannerPamela S, PA-C  levofloxacin (LEVAQUIN) 500 MG tablet Take 1 tablet (500 mg total) by mouth daily. 07/19/17   Houston SirenSainani, Vivek J, MD  lisinopril (PRINIVIL,ZESTRIL) 10 MG tablet Take 1 tablet (10 mg total) by mouth daily. 07/19/17   Houston SirenSainani, Vivek J, MD  metoprolol tartrate (LOPRESSOR) 25 MG tablet Take 1 tablet (25 mg total) by mouth 2 (two) times daily. 07/19/17   Houston SirenSainani, Vivek J, MD  predniSONE (DELTASONE) 10 MG tablet 40mg  daily for 4 more days 12/17/17   Governor RooksLord, Rebecca, MD      VITAL SIGNS:  Blood pressure 94/68, pulse 82, temperature 98.2 F (36.8 C), temperature source Oral, resp. rate 17, height 5\' 6"  (1.676 m), weight 260 lb (117.9 kg), SpO2 97 %.  PHYSICAL EXAMINATION:  Physical Exam  GENERAL:  52 y.o.-year-old patient lying in the bed with no acute distress.  EYES: Pupils equal, round, reactive to light and accommodation. No scleral icterus. Extraocular muscles intact.  HEENT: Head atraumatic, normocephalic. Oropharynx and nasopharynx clear.  NECK:  Supple, no jugular venous distention. No thyroid enlargement, no tenderness.  Trach collar in situ. LUNGS: Normal breath sounds bilaterally, no wheezing, rales,rhonchi or crepitation. No use of accessory muscles of respiration.  CARDIOVASCULAR: S1, S2 normal. No murmurs, rubs, or gallops.  ABDOMEN: Soft, nontender, nondistended. Bowel sounds present. No organomegaly or mass.  Umbilical hernia. EXTREMITIES: No pedal edema, cyanosis, or clubbing.  NEUROLOGIC: Cranial nerves II through XII are intact. Muscle strength 5/5 in all extremities. Sensation intact. Gait not checked.  PSYCHIATRIC: The patient is alert and oriented x 3.  SKIN: No obvious rash, lesion, or ulcer.   LABORATORY PANEL:    CBC Recent Labs  Lab 03/26/18 1548  WBC 4.5  HGB 15.9  HCT 46.0  PLT 314   ------------------------------------------------------------------------------------------------------------------  Chemistries  Recent Labs  Lab 03/26/18 1548  NA 132*  K 3.9  CL 86*  CO2 30  GLUCOSE 111*  BUN 97*  CREATININE 5.72*  CALCIUM 10.0  AST 49*  ALT 31  ALKPHOS 85  BILITOT 1.3*   ------------------------------------------------------------------------------------------------------------------  Cardiac Enzymes Recent Labs  Lab 03/26/18 1548  TROPONINI 0.05*   ------------------------------------------------------------------------------------------------------------------  RADIOLOGY:  Dg Chest 2 View  Result Date: 03/26/2018 CLINICAL DATA:  Postprandial vomiting. Tracheostomy patient. Previous CVA. Current smoker. History of CHF EXAM: CHEST - 2 VIEW COMPARISON:  Chest x-ray of December 17, 2017 FINDINGS: The lungs are adequately inflated. The lung markings are mildly increased at the left base. There is no discrete infiltrate and no pleural effusion. The cardiac silhouette is enlarged. The central pulmonary vascularity is prominent. The tracheostomy tube tip  projects at the inferior margin of the clavicular head. IMPRESSION: Chronic bronchitic-smoking related changes. Possible subsegmental atelectasis at the left lung base but there is overlap of costal cartilages and posterior ribs which accentuates density here. Enlargement of the cardiac silhouette without pulmonary vascular congestion or pulmonary edema. Electronically Signed   By: David  [place] M.D.   On: 03/26/2018 16:21      IMPRESSION AND PLAN:   Acute renal failure, due to ATN secondary to longtime vomiting. The patient will be admitted to medical floor. Hold Lasix, lisinopril, start normal saline IV, follow-up BMP and nephrology consult.\  Hypotension.  Give normal saline bolus and continue IV infusion.  Hold  hypertension medication.  Intermittent vomiting, unclear etiology. Follow-up CAT scan of the abdomen.  Hyponatremia.  Normal saline IV and follow-up BMP.  A. fib with RVR.  Continue amiodarone and ASA. Cardiology consult for anticoagulation.  Unable to give beta-blocker due to hypotension.  Elevated troponin due to above.  Continue aspirin.  Follow-up cardiology consult.  Sleep apnea, status post tracheostomy 3 years. On trach collar.  Alcohol abuse. CIWA protocol. Tobacco abuse.  Smoking cessation was counseled for 4 minutes, nicotine patch.  All the records are reviewed and case discussed with ED provider. Management plans discussed with the patient, pleasant and they are in agreement.  CODE STATUS: Full code  TOTAL TIME TAKING CARE OF THIS PATIENT: 60 minutes.    Shaune Pollack M.D on 03/26/2018 at 6:03 PM  Between 7am to 6pm - Pager - [phone]  After 6pm go to [email or web address] - Social research officer, government  [facility]  Office  [phone]  CC: Primary care physician; [name], MD   Note: This dictation was prepared with Dragon dictation along with smaller phrase technology. Any transcriptional errors that result from this process are unin

## 2018-03-26 NOTE — ED Provider Notes (Signed)
[facility] Emergency Department Provider Note  ___________________________________________   First MD Initiated Contact with Patient 03/26/18 1555     (approximate)  I have reviewed the triage vital signs and the nursing notes.   HISTORY  Chief Complaint Emesis   HPI Andrew Sparks is a 52 y.o. male with a history of atrial fibrillation on Xarelto as well as amiodarone, CHF and alcoholism who is presenting to the emergency department today with 3 months of vomiting after eating as well as worsening lower abdominal pain.  He says that he came in today because his family was concerned about him, mainly because of his drinking.  He says that he drinks "a couple 40s" every day.  He says that his abdominal pain comes and goes and is not experiencing any pain at this time.  Says that he is also been increasingly short of breath over the past 3 months but says that he has a normal amount of secretions from his trach.  Says that he took all of his medications today.  Is not reporting fever.  Past Medical History:  Diagnosis Date  . Anxiety   . CHF (congestive heart failure) (HCC)   . Depression   . Hypersomnia with sleep apnea   . Hypertension   . Morbid obesity (HCC)   . NSTEMI (non-ST elevated myocardial infarction) (HCC) 2005  . Obesity hypoventilation syndrome (HCC)   . Shortness of breath dyspnea   . Sleep apnea   . Stroke (HCC)   . V-tach [facility])     Patient Active Problem List   Diagnosis Date Noted  . CHF (congestive heart failure) (HCC) 07/17/2017  . 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 Panic disorder 01/18/2017  . Chest pain 01/17/2017  . Tracheostomy care (HCC)   . 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 Debility 12/16/2014  . Obesity hypoventilation syndrome (HCC) 12/16/2014  . Encephalopathy pulmonary 12/15/2014    Past Surgical History:  Procedure Laterality  Date  . TRACHEOSTOMY      Prior to Admission medications   Medication Sig Start Date End Date Taking? Authorizing Provider  albuterol (PROVENTIL HFA;VENTOLIN HFA) 108 (90 Base) MCG/ACT inhaler Inhale 2 puffs into the lungs every 6 (six) hours as needed for wheezing or shortness of breath. 12/17/17   Governor Rooks, MD  ALPRAZolam Prudy Feeler) 0.25 MG tablet Take 1 tablet (0.25 mg total) by mouth daily as needed for anxiety (panic attack). 01/18/17   Clapacs, Jackquline Denmark, MD  amiodarone (PACERONE) 200 MG tablet Take 1 tablet (200 mg total) by mouth daily. 07/19/17   Houston Siren, MD  aspirin EC 81 MG tablet Take 81 mg by mouth daily.    [provider]  azithromycin (ZITHROMAX) 250 MG tablet One tab daily for 4 more days 12/17/17   Governor Rooks, MD  cloNIDine (CATAPRES) 0.1 MG tablet Take 1 tablet (0.1 mg total) by mouth 3 (three) times daily. 07/19/17   Houston Siren, MD  FLUoxetine (PROZAC) 20 MG capsule Take 1 capsule (20 mg total) by mouth daily. 07/19/17   Houston Siren, MD  furosemide (LASIX) 40 MG tablet Take 1 tablet (40 mg total) by mouth daily. 07/19/17   Houston Siren, MD  hydrALAZINE (APRESOLINE) 100 MG tablet Take 1 tablet (100 mg total) by mouth 3 (three) times daily. 12/23/14   Love, Evlyn Kanner, PA-C  levofloxacin (LEVAQUIN) 500 MG tablet Take 1 tablet (500 mg total)  by mouth daily. 07/19/17   Houston SirenSainani, Vivek J, MD  lisinopril (PRINIVIL,ZESTRIL) 10 MG tablet Take 1 tablet (10 mg total) by mouth daily. 07/19/17   Houston SirenSainani, Vivek J, MD  metoprolol tartrate (LOPRESSOR) 25 MG tablet Take 1 tablet (25 mg total) by mouth 2 (two) times daily. 07/19/17   Houston SirenSainani, Vivek J, MD  predniSONE (DELTASONE) 10 MG tablet 40mg  daily for 4 more days 12/17/17   Governor RooksLord, Rebecca, MD    Allergies Patient has no known allergies.  Family History  Problem Relation Age of Onset  . Heart disease Father   . Diabetes type II Mother   . Breast cancer Mother     Social History Social History   Tobacco Use    . Smoking status: Current Every Day Smoker    Packs/day: 0.50    Years: 28.00    Pack years: 14.00    Types: Cigarettes  . Smokeless tobacco: Never Used  Substance Use Topics  . Alcohol use: Yes    Alcohol/week: 4.8 oz    Types: 8 Cans of beer per week  . Drug use: No    Review of Systems  Constitutional: No fever/chills Eyes: No visual changes. ENT: No sore throat. Cardiovascular: Denies chest pain. Respiratory: As above Gastrointestinal:  No diarrhea.  No constipation. Genitourinary: Negative for dysuria. Musculoskeletal: Negative for back pain. Skin: Negative for rash. Neurological: Negative for headaches, focal weakness or numbness.   ____________________________________________   PHYSICAL EXAM:  VITAL SIGNS: ED Triage Vitals  Enc Vitals Group     BP 03/26/18 1638 91/64     Pulse Rate 03/26/18 1638 100     Resp 03/26/18 1638 18     Temp 03/26/18 1638 98.2 F (36.8 C)     Temp Source 03/26/18 1638 Oral     SpO2 03/26/18 1638 99 %     Weight 03/26/18 1539 260 lb (117.9 kg)     Height 03/26/18 1539 5\' 6"  (1.676 m)     Head Circumference --      Peak Flow --      Pain Score 03/26/18 1539 0     Pain Loc --      Pain Edu? --      Excl. in GC? --     Constitutional: Alert and oriented. Well appearing and in no acute distress. Eyes: Conjunctivae are normal.  Head: Atraumatic. Nose: No congestion/rhinnorhea. Mouth/Throat: Mucous membranes are moist.  Neck: No stridor.  Trach collar in place.  Tracheostomy with small amount of opaque secretions.  No surrounding erythema, induration or exudates. Cardiovascular: Heart rate between the 90s and 1 teens with an irregularly irregular rhythm. Grossly normal heart sounds.   Respiratory: Normal respiratory effort.  No retractions.  Mild coarse wheezing throughout. Gastrointestinal: Soft with tense and irreducible umbilical hernia which is tender to palpation. No distention.  Musculoskeletal: No lower extremity  tenderness nor edema.  No joint effusions. Neurologic:  Normal speech and language. No gross focal neurologic deficits are appreciated. Skin:  Skin is warm, dry and intact. No rash noted. Psychiatric: Mood and affect are normal. Speech and behavior are normal.  ____________________________________________   LABS (all labs ordered are listed, but only abnormal results are displayed)  Labs Reviewed  LIPASE, BLOOD - Abnormal; Notable for the following components:      Result Value   Lipase 65 (*)    All other components within normal limits  COMPREHENSIVE METABOLIC PANEL - Abnormal; Notable for the following components:   Sodium 132 (*)  Chloride 86 (*)    Glucose, Bld 111 (*)    BUN 97 (*)    Creatinine, Ser 5.72 (*)    Total Protein 8.4 (*)    AST 49 (*)    Total Bilirubin 1.3 (*)    GFR calc non Af Amer 10 (*)    GFR calc Af Amer 12 (*)    Anion gap 16 (*)    All other components within normal limits  TROPONIN I - Abnormal; Notable for the following components:   Troponin I 0.05 (*)    All other components within normal limits  CBC  URINALYSIS, COMPLETE (UACMP) WITH MICROSCOPIC   ____________________________________________  EKG  ED ECG REPORT I, Arelia Longest, the attending physician, personally viewed and interpreted this ECG.   Date: 03/26/2018  EKG Time: 1544  Rate: 122  Rhythm: atrial fibrillation, rate 122  Axis: Right superior axis  Intervals:none  ST&T Change: No ST segment elevation or depression.  No abnormal T wave inversion.  ____________________________________________  RADIOLOGY  Chronic bronchitic changes.  Pending CT of the abdomen ____________________________________________   PROCEDURES  Procedure(s) performed:   Procedures  Critical Care performed:   ____________________________________________   INITIAL IMPRESSION / ASSESSMENT AND PLAN / ED COURSE  Pertinent labs & imaging results that were available during my care of the  patient were reviewed by me and considered in my medical decision making (see chart for details).  Differential diagnosis includes, but is not limited to, acute appendicitis, renal colic, testicular torsion, urinary tract infection/pyelonephritis, prostatitis,  epididymitis, diverticulitis, small bowel obstruction or ileus, colitis, abdominal aortic aneurysm, gastroenteritis, hernia, etc. Differential includes, but is not limited to, viral syndrome, bronchitis including COPD exacerbation, pneumonia, reactive airway disease including asthma, CHF including exacerbation with or without pulmonary/interstitial edema, pneumothorax, ACS, thoracic trauma, and pulmonary embolism. As part of my medical decision making, I reviewed the following data within the electronic MEDICAL RECORD NUMBER Notes from prior ED visits  ----------------------------------------- 4:51 PM on 03/26/2018 -----------------------------------------  Concern for incarcerated hernia.  Called surgery, Dr. Earlene Plater, who says that he will evaluate the patient at the bedside.  ----------------------------------------- 5:41 PM on 03/26/2018 -----------------------------------------  Labs concerning for creatinine which is now 5.7 up from 1 in January.  BUN now of 97.  Potassium of 3.9.  Patient likely with symptomatic uremia causing the nausea and vomiting.  I discussed the case with Dr. Earlene Plater who evaluated the patient at the bedside was able to mostly reduce the hernia but then the hernia returned.  He is recommending a CAT scan of the abdomen and pelvis.  I discussed the case with the patient, including his lab results and the need for admission to the hospital.  Signed out to Dr. Johny Drilling of the medicine service. ____________________________________________   FINAL CLINICAL IMPRESSION(S) / ED DIAGNOSES  Acute kidney failure.  Uremia.  Nausea and vomiting.  Umbilical hernia.  Atrial fibrillation with RVR.    NEW MEDICATIONS STARTED DURING  THIS VISIT:  New Prescriptions   No medications on file     Note:  This document was prepared using Dragon voice recognition software and may include unintentional dictation errors.     Myrna Blazer, MD 03/26/18 [id number]

## 2018-03-26 NOTE — ED Notes (Signed)
Pt unable to produce urine specimen at this time.

## 2018-03-26 NOTE — ED Triage Notes (Signed)
Here for vomiting with eating for last 3 months.  Has not been evaluated because "I don't like going to the doctors".  Pain to lower abdomen.  Has had similar problems in past r/t ulcer.  Last couple weeks c/o generalized weakness and fatigue.  Does have trach r/t OSA.  Has had SHOB since vomiting.

## 2018-03-26 NOTE — Progress Notes (Signed)
Patient has 6 xlt proximal trach cuffless. He states he does not use humidification at home. He has a concentrator that is set on 2 liters and it is connected to trach via tubing and an adapter. Patient is on a 30% venturi trach mask at 4 liters with saturation noted at 92-94% RR 18 BBS diminished. Patient states he does not require a lot of suctioning. He prefers to cough out his secretions. I have changed inner cannula. Extra inner cannula, trach ties, 6 xlt cuffed trach and ambu bag as well as suctioned catheters all have been placed at patient bedside. RN aware. Patient in no distress at this time.

## 2018-03-26 NOTE — Consult Note (Signed)
Subjective:   HPI:  Andrew Sparks is a 52 y.o. male who presents for evaluation of an incarcerated umbilical hernia. The pain is described as stabbing and throbbing and is 4/10 in intensity.  Pain pattern is is intermittent Onset was 3 months ago. Symptoms have been gradually worsening since. Aggravating factors: bending over and coughing.  Alleviating factors: relaxation and lying down. Associated symptoms: Other symptoms include nausea and vomiting frequently although this could be related to his alcohol consumrtion, early satiety after eating in the am.  Patient states that this pain has gotten worse over the last several months as well as the size.  He has not sought surgical consultation.  He states that he can eat small amounts but then is full the rest of the day and then can attmept to eat or take his medication and vomit a few moments later... The patient denies melena or hematochezia.  Patient History:  The following portions of the patient's history were reviewed and updated as appropriate: allergies, current medications, past family history, past medical history, past social history, past surgical history and problem list.  Review of Systems Pertinent items noted in HPI and remainder of comprehensive ROS otherwise negative.    Objective:    BP 91/70   Pulse (!) 101   Temp 98.2 F (36.8 C) (Oral)   Resp 18   Ht 5\' 6"  (1.676 m)   Wt 260 lb (117.9 kg)   SpO2 100%   BMI 41.97 kg/m   General:  alert, cooperative and appears stated age  Skin:  normal and no rash or abnormalities  Eyes: conjunctivae/corneas clear. PERRL, EOM's intact. Fundi benign.  Mouth: MMM no lesions  Lymph Nodes:  Cervical, supraclavicular, and axillary nodes normal.  Lungs:  course breath sounds with tracheostomy in tact  Heart:  regular rate and rhythm, S1, S2 normal, no murmur, click, rub or gallop  Abdomen: soft, no peritonitis, nearly reducible umbilical hernia, minor overlying erythema, TTP  during reduction but not firm.  Easily returns after patient coughs  CVA:  absent  Genitourinary: defer exam  Extremities:  extremities normal, atraumatic, no cyanosis or edema  Neurologic:  Alert and oriented x3. Gait normal. Reflexes and motor strength normal and symmetric. Cranial nerves 2-12 and sensation grossly intact.  Psychiatric:  normal mood, behavior, speech, dress, and thought processes     Assessment:     Diff Dx:  Incarcerated hernia, gastric outlet obstruction, PUD antral inflammation, Esophagitis from excessive ETOH consumption, cirrhosis Plan:   Patient is a very high surgical risk.  [facility].  If this is negative, he may need further workup by GI for upper endoscopy or an UGI.  If his hernia is incarcerated, he will need surgical intervention promptly.

## 2018-03-26 NOTE — ED Notes (Signed)
Surgeon at bedside.  

## 2018-03-27 ENCOUNTER — Inpatient Hospital Stay: Payer: Medicare Other

## 2018-03-27 LAB — BASIC METABOLIC PANEL
ANION GAP: 10 (ref 5–15)
BUN: 93 mg/dL — ABNORMAL HIGH (ref 6–20)
CALCIUM: 9.3 mg/dL (ref 8.9–10.3)
CO2: 31 mmol/L (ref 22–32)
CREATININE: 5.08 mg/dL — AB (ref 0.61–1.24)
Chloride: 93 mmol/L — ABNORMAL LOW (ref 101–111)
GFR calc non Af Amer: 12 mL/min — ABNORMAL LOW (ref >60)
GFR, EST AFRICAN AMERICAN: 14 mL/min — AB (ref >60)
Glucose, Bld: 122 mg/dL — ABNORMAL HIGH (ref 65–99)
Potassium: 3.4 mmol/L — ABNORMAL LOW (ref 3.5–5.1)
SODIUM: 134 mmol/L — AB (ref 135–145)

## 2018-03-27 LAB — URINALYSIS, COMPLETE (UACMP) WITH MICROSCOPIC
BACTERIA UA: NONE SEEN
Bilirubin Urine: NEGATIVE
GLUCOSE, UA: NEGATIVE mg/dL
Hgb urine dipstick: NEGATIVE
KETONES UR: NEGATIVE mg/dL
Leukocytes, UA: NEGATIVE
NITRITE: NEGATIVE
PROTEIN: NEGATIVE mg/dL
Specific Gravity, Urine: 1.011 (ref 1.005–1.030)
pH: 5 (ref 5.0–8.0)

## 2018-03-27 LAB — CBC
HCT: 40.8 % (ref 40.0–52.0)
HEMOGLOBIN: 14 g/dL (ref 13.0–18.0)
MCH: 33.5 pg (ref 26.0–34.0)
MCHC: 34.5 g/dL (ref 32.0–36.0)
MCV: 97.2 fL (ref 80.0–100.0)
PLATELETS: 252 10*3/uL (ref 150–440)
RBC: 4.19 MIL/uL — AB (ref 4.40–5.90)
RDW: 14.7 % — ABNORMAL HIGH (ref 11.5–14.5)
WBC: 3.8 10*3/uL (ref 3.8–10.6)

## 2018-03-27 LAB — HEMOGLOBIN A1C
Hgb A1c MFr Bld: 6.6 % — ABNORMAL HIGH (ref 4.8–5.6)
MEAN PLASMA GLUCOSE: 142.72 mg/dL

## 2018-03-27 MED ORDER — CITALOPRAM HYDROBROMIDE 20 MG PO TABS
20.0000 mg | ORAL_TABLET | Freq: Every day | ORAL | Status: DC
  Administered 2018-03-27 – 2018-03-29 (×3): 20 mg via ORAL
  Filled 2018-03-27 (×3): qty 1

## 2018-03-27 MED ORDER — FLUOXETINE HCL 20 MG PO CAPS
20.0000 mg | ORAL_CAPSULE | Freq: Every day | ORAL | Status: DC
  Administered 2018-03-27 – 2018-03-29 (×3): 20 mg via ORAL
  Filled 2018-03-27 (×3): qty 1

## 2018-03-27 MED ORDER — ASPIRIN EC 81 MG PO TBEC
81.0000 mg | DELAYED_RELEASE_TABLET | Freq: Every day | ORAL | Status: DC
  Administered 2018-03-27 – 2018-03-29 (×3): 81 mg via ORAL
  Filled 2018-03-27 (×3): qty 1

## 2018-03-27 MED ORDER — AMIODARONE HCL 200 MG PO TABS
200.0000 mg | ORAL_TABLET | Freq: Every day | ORAL | Status: DC
  Administered 2018-03-27 – 2018-03-29 (×3): 200 mg via ORAL
  Filled 2018-03-27 (×3): qty 1

## 2018-03-27 MED ORDER — ALPRAZOLAM 0.25 MG PO TABS: 0.2500 mg | ORAL_TABLET | Freq: Every day | ORAL | Status: DC | PRN

## 2018-03-27 NOTE — Progress Notes (Signed)
[facility] - [place] [facility]   PATIENT NAME: Andrew Sparks    MR#:  [id number]  DATE OF BIRTH:  1966-11-03  SUBJECTIVE:   Pt. Here due to vague abdominal pain which has resolved now.  Noted to be in acute Kidney injury.  Cr. Slightly improved since yesterday.    REVIEW OF SYSTEMS:    Review of Systems  Constitutional: Negative for chills and fever.  HENT: Negative for congestion and tinnitus.   Eyes: Negative for blurred vision and double vision.  Respiratory: Negative for cough, shortness of breath and wheezing.   Cardiovascular: Negative for chest pain, orthopnea and PND.  Gastrointestinal: Positive for abdominal pain. Negative for diarrhea, nausea and vomiting.  Genitourinary: Negative for dysuria and hematuria.  Neurological: Negative for dizziness, sensory change and focal weakness.  All other systems reviewed and are negative.   Nutrition: Heart healthy Tolerating Diet: Yes Tolerating PT: Await Eval.   DRUG ALLERGIES:  No Known Allergies  VITALS:  Blood pressure 113/79, pulse 61, temperature 97.8 F (36.6 C), temperature source Oral, resp. rate 18, height 5\' 6"  (1.676 m), weight 117.9 kg (260 lb), SpO2 99 %.  PHYSICAL EXAMINATION:   Physical Exam  GENERAL:  52 y.o.-year-old obese patient lying in bed in no acute distress.  EYES: Pupils equal, round, reactive to light and accommodation. No scleral icterus. Extraocular muscles intact.  HEENT: Head atraumatic, normocephalic. Oropharynx and nasopharynx clear. + Trach in place NECK:  Supple, no jugular venous distention. No thyroid enlargement, no tenderness.  LUNGS: Normal breath sounds bilaterally, no wheezing, rales, rhonchi. No use of accessory muscles of respiration.  CARDIOVASCULAR: S1, S2 normal. No murmurs, rubs, or gallops.  ABDOMEN: Soft, nontender, nondistended. Bowel sounds present. No organomegaly or mass.  peri-umbilical hernia but no incarcerated.  EXTREMITIES: No cyanosis,  clubbing or edema b/l.    NEUROLOGIC: Cranial nerves II through XII are intact. No focal Motor or sensory deficits b/l. Globally weak.    PSYCHIATRIC: The patient is alert and oriented x 3.  SKIN: No obvious rash, lesion, or ulcer.    LABORATORY PANEL:   CBC Recent Labs  Lab 03/27/18 0453  WBC 3.8  HGB 14.0  HCT 40.8  PLT 252   ------------------------------------------------------------------------------------------------------------------  Chemistries  Recent Labs  Lab 03/26/18 1548  03/27/18 0453  NA 132*  --  134*  K 3.9  --  3.4*  CL 86*  --  93*  CO2 30  --  31  GLUCOSE 111*  --  122*  BUN 97*  --  93*  CREATININE 5.72*   < > 5.08*  CALCIUM 10.0  --  9.3  AST 49*  --   --   ALT 31  --   --   ALKPHOS 85  --   --   BILITOT 1.3*  --   --    < > = values in this interval not displayed.   ------------------------------------------------------------------------------------------------------------------  Cardiac Enzymes Recent Labs  Lab 03/26/18 1548  TROPONINI 0.05*   ------------------------------------------------------------------------------------------------------------------  RADIOLOGY:  Ct Abdomen Pelvis Wo Contrast  Result Date: 03/26/2018 CLINICAL DATA:  Nausea and vomiting EXAM: CT ABDOMEN AND PELVIS WITHOUT CONTRAST TECHNIQUE: Multidetector CT imaging of the abdomen and pelvis was performed following the standard protocol without IV contrast. COMPARISON:  None. FINDINGS: Lower chest: Lung bases demonstrate of 5 mm nodule in the right lower lobe, series 3, image number 7. Cardiomegaly. No acute consolidation or effusion. Coronary vascular calcification. Hepatobiliary: No focal hepatic abnormality or  biliary dilatation. High density within the gallbladder lumen which may reflect sludge and or stones. Pancreas: Unremarkable. No pancreatic ductal dilatation or surrounding inflammatory changes. Spleen: Normal in size without focal abnormality. Adrenals/Urinary  Tract: Adrenal glands are within normal limits. No hydronephrosis. Probable cyst mid left kidney. Bladder nearly empty Stomach/Bowel: Stomach is within normal limits. Appendix not well seen but no right lower quadrant inflammatory process. No evidence of bowel wall thickening, distention, or inflammatory changes. Vascular/Lymphatic: Moderate aortic atherosclerosis. No aneurysmal dilatation. No significantly enlarged lymph nodes. Reproductive: Prostate is unremarkable. Other: Fat in the inguinal canals. No free air or free fluid. Moderate fat containing umbilical and periumbilical hernia. Musculoskeletal: Degenerative changes. No acute or suspicious abnormality IMPRESSION: 1. No CT evidence for acute intra-abdominal or pelvic abnormality. 2. High density material in the gallbladder suggesting sludge and or stones. No surrounding wall thickening or right upper quadrant inflammation 3. Cardiomegaly 4. 5 mm pulmonary nodule in the right lower lobe. No follow-up needed if patient is low-risk. Non-contrast chest CT can be considered in 12 months if patient is high-risk. This recommendation follows the consensus statement: Guidelines for Management of Incidental Pulmonary Nodules Detected on CT Images: From the Fleischner Society 2017; Radiology 2017; 284:228-243. 5. Moderate fat containing umbilical and periumbilical hernia. Electronically Signed   By: Jasmine PangKim  Fujinaga M.D.   On: 03/26/2018 18:41   Dg Chest 2 View  Result Date: 03/26/2018 CLINICAL DATA:  Postprandial vomiting. Tracheostomy patient. Previous CVA. Current smoker. History of CHF EXAM: CHEST - 2 VIEW COMPARISON:  Chest x-ray of December 17, 2017 FINDINGS: The lungs are adequately inflated. The lung markings are mildly increased at the left base. There is no discrete infiltrate and no pleural effusion. The cardiac silhouette is enlarged. The central pulmonary vascularity is prominent. The tracheostomy tube tip projects at the inferior margin of the clavicular  head. IMPRESSION: Chronic bronchitic-smoking related changes. Possible subsegmental atelectasis at the left lung base but there is overlap of costal cartilages and posterior ribs which accentuates density here. Enlargement of the cardiac silhouette without pulmonary vascular congestion or pulmonary edema. Electronically Signed   By: David  SwazilandJordan M.D.   On: 03/26/2018 16:21   [place] Renal  Result Date: 03/27/2018 CLINICAL DATA:  Acute renal failure EXAM: RENAL / URINARY TRACT ULTRASOUND COMPLETE COMPARISON:  CT abdomen pelvis 03/26/2018 FINDINGS: Right Kidney: Length: 12.5 cm. 1 cm upper pole cyst. Negative for hydronephrosis. Normal cortical echogenicity. No mass lesion. Left Kidney: Length: 11.5 cm. 17 mm left lower pole cyst. Echogenicity within normal limits. No mass or hydronephrosis visualized. Bladder: Appears normal for degree of bladder distention. IMPRESSION: Negative renal ultrasound. Electronically Signed   By: Marlan Palauharles  Clark M.D.   On: 03/27/2018 11:07     ASSESSMENT AND PLAN:   52 history of hypertension, previous CVA, obesity pickwickian syndrome, CHF, anxiety who presented to the hospital due to vague abdominal pain also noted to be in acute kidney injury.  1.  Acute renal failure-suspected to be prerenal in nature secondary to ongoing nausea vomiting. -Patient's renal ultrasound is negative for hydronephrosis or obstruction.  Patient is voiding. -Continue to hold diuretics, continue IV fluids.  Follow BUN and creatinine urine output.  Appreciate nephrology consult.  2.  Abdominal pain- improved since yesterday.  Thought to be secondary to incarcerated umbilical hernia but CT scan showing no evidence of incarceration.  Patient's hernia is easily reducible.  Seen by general surgery and no plans for acute surgical intervention.  Continue supportive care for now.  Patient is tolerating p.o. well and had a bowel movement yesterday.  3.  Chronic respiratory failure-secondary to obesity  pickwickian syndrome.  Patient is status post tracheostomy. -Continue trach care per protocol.  No acute pathology presently.  Next  4.  History of alcohol abuse-continue CIWA protocol.  No evidence of alcohol withdrawal.  5.  Anxiety/depression-continue Xanax, Prozac.  Continue Celexa.  6.  Tobacco abuse-continue nicotine patch.     All the records are reviewed and case discussed with Care Management/Social Worker. Management plans discussed with the patient, family and they are in agreement.  CODE STATUS: Full code  DVT Prophylaxis: Heparin subcutaneous  TOTAL TIME TAKING CARE OF THIS PATIENT: 30 minutes.   POSSIBLE D/C IN 1-2 DAYS, DEPENDING ON CLINICAL CONDITION.   Houston Siren M.D on 03/27/2018 at 2:26 PM  Between 7am to 6pm - Pager - [phone]  After 6pm go to [email or web address] - Social research officer, government  [facility] [place] [facility]  Office  [phone]  CC: Primary care physician; [name], MD

## 2018-03-27 NOTE — Progress Notes (Signed)
Subjective/Chief Complaint: CT scav overnight did not demonstrate any bowel issues or herniated abdominal contents   Objective: Vital signs in last 24 hours: Temp:  [97.5 F (36.4 C)-98.2 F (36.8 C)] 97.8 F (36.6 C) (04/24 0238) Pulse Rate:  [61-118] 61 (04/24 1140) Resp:  [11-19] 18 (04/24 1140) BP: (80-128)/(64-85) 113/79 (04/24 1140) SpO2:  [96 %-100 %] 99 % (04/24 1140) Weight:  [260 lb (117.9 kg)] 260 lb (117.9 kg) (04/23 1539) Last BM Date: 03/26/18  Intake/Output from previous day: 04/23 0701 - 04/24 0700 In: 1361 [I.V.:861; IV Piggyback:500] Out: 575 [Urine:575] Intake/Output this shift: Total I/O In: 360 [P.O.:360] Out: 400 [Urine:400]  General appearance: alert, cooperative and no distress Head: Normocephalic, without obvious abnormality, atraumatic Eyes: conjunctivae/corneas clear. PERRL, EOM's intact. Fundi benign. Neck: no adenopathy, supple, symmetrical, trachea midline and thyroid not enlarged, symmetric, no tenderness/mass/nodules Resp: trach in place but patient speaks over the tracheostomy GI: soft, mildly TTP at incarcerated [facility]; bowel sounds normal; no masses,  no organomegaly Skin: Skin color, texture, turgor normal. No rashes or lesions Lymph nodes: Cervical, supraclavicular, and axillary nodes normal. Neurologic: Alert and oriented X 3, normal strength and tone. Normal symmetric reflexes. Normal coordination and gait  Lab Results:  Recent Labs    03/26/18 1548 03/27/18 0453  WBC 4.5 3.8  HGB 15.9 14.0  HCT 46.0 40.8  PLT 314 252   BMET Recent Labs    03/26/18 1548 03/26/18 1947 03/27/18 0453  NA 132*  --  134*  K 3.9  --  3.4*  CL 86*  --  93*  CO2 30  --  31  GLUCOSE 111*  --  122*  BUN 97*  --  93*  CREATININE 5.72* 5.67* 5.08*  CALCIUM 10.0  --  9.3   PT/INR No results for input(s): LABPROT, INR in the last 72 hours. ABG No results for input(s): PHART, HCO3 in the last 72 hours.  Invalid input(s): PCO2,  PO2  Studies/Results: Ct Abdomen Pelvis Wo Contrast  Result Date: 03/26/2018 CLINICAL DATA:  Nausea and vomiting EXAM: CT ABDOMEN AND PELVIS WITHOUT CONTRAST TECHNIQUE: Multidetector CT imaging of the abdomen and pelvis was performed following the standard protocol without IV contrast. COMPARISON:  None. FINDINGS: Lower chest: Lung bases demonstrate of 5 mm nodule in the right lower lobe, series 3, image number 7. Cardiomegaly. No acute consolidation or effusion. Coronary vascular calcification. Hepatobiliary: No focal hepatic abnormality or biliary dilatation. High density within the gallbladder lumen which may reflect sludge and or stones. Pancreas: Unremarkable. No pancreatic ductal dilatation or surrounding inflammatory changes. Spleen: Normal in size without focal abnormality. Adrenals/Urinary Tract: Adrenal glands are within normal limits. No hydronephrosis. Probable cyst mid left kidney. Bladder nearly empty Stomach/Bowel: Stomach is within normal limits. Appendix not well seen but no right lower quadrant inflammatory process. No evidence of bowel wall thickening, distention, or inflammatory changes. Vascular/Lymphatic: Moderate aortic atherosclerosis. No aneurysmal dilatation. No significantly enlarged lymph nodes. Reproductive: Prostate is unremarkable. Other: Fat in the inguinal canals. No free air or free fluid. Moderate fat containing umbilical and periumbilical hernia. Musculoskeletal: Degenerative changes. No acute or suspicious abnormality IMPRESSION: 1. No CT evidence for acute intra-abdominal or pelvic abnormality. 2. High density material in the gallbladder suggesting sludge and or stones. No surrounding wall thickening or right upper quadrant inflammation 3. Cardiomegaly 4. 5 mm pulmonary nodule in the right lower lobe. No follow-up needed if patient is low-risk. Non-contrast chest CT can be considered in 12 months if patient is  high-risk. This recommendation follows the consensus statement:  Guidelines for Management of Incidental Pulmonary Nodules Detected on CT Images: From the Fleischner Society 2017; Radiology 2017; 284:228-243. 5. Moderate fat containing umbilical and periumbilical hernia. Electronically Signed   By: Jasmine PangKim  Fujinaga M.D.   On: 03/26/2018 18:41   Dg Chest 2 View  Result Date: 03/26/2018 CLINICAL DATA:  Postprandial vomiting. Tracheostomy patient. Previous CVA. Current smoker. History of CHF EXAM: CHEST - 2 VIEW COMPARISON:  Chest x-ray of December 17, 2017 FINDINGS: The lungs are adequately inflated. The lung markings are mildly increased at the left base. There is no discrete infiltrate and no pleural effusion. The cardiac silhouette is enlarged. The central pulmonary vascularity is prominent. The tracheostomy tube tip projects at the inferior margin of the clavicular head. IMPRESSION: Chronic bronchitic-smoking related changes. Possible subsegmental atelectasis at the left lung base but there is overlap of costal cartilages and posterior ribs which accentuates density here. Enlargement of the cardiac silhouette without pulmonary vascular congestion or pulmonary edema. Electronically Signed   By: David  [place]il[place]Jordan M.D.   On: 03/26/2018 16:21   [place] Renal  Result Date: 03/27/2018 CLINICAL DATA:  Acute renal failure EXAM: RENAL / URINARY TRACT ULTRASOUND COMPLETE COMPARISON:  CT abdomen pelvis 03/26/2018 FINDINGS: Right Kidney: Length: 12.5 cm. 1 cm upper pole cyst. Negative for hydronephrosis. Normal cortical echogenicity. No mass lesion. Left Kidney: Length: 11.5 cm. 17 mm left lower pole cyst. Echogenicity within normal limits. No mass or hydronephrosis visualized. Bladder: Appears normal for degree of bladder distention. IMPRESSION: Negative renal ultrasound. Electronically Signed   By: Marlan Palauharles  Clark M.D.   On: 03/27/2018 11:07    Anti-infectives: Anti-infectives (From admission, onward)   None      Assessment/Plan: s/p * No surgery found * No surgical plans at this  time.  Hernia would be more amenable to surgery as an outpatient when patient has decreased smoking and alcohol.  FU in outpatient  LOS: 1 day    Jerold CoombeBarry D Dezirae Service 03/27/2018

## 2018-03-27 NOTE — Consult Note (Signed)
[facility]  CONSULT NOTE    Date: 03/27/2018                  Patient Name:  Andrew Sparks  MRN: [id number]  DOB: 05/20/1966  Age / Sex: 52 y.o., male         PCP: [name], MD                 Service Requesting Consult: Dr. Cherlynn Kaiser                 Reason for Consult: Acute Renal Failure            History of Present Illness: Andrew Sparks is a 52 y.o. black male with hypertension, atrial fibrillation, obstructive sleep apnea status post tracheostomy, congestive heart failure, coronary artery disease, cerebrovascular accident, depression, tobacco use, who was admitted to [facility] on 03/26/2018 for Uremia [N19] Atrial fibrillation with RVR (HCC) [I48.91] Acute renal failure, unspecified acute renal failure type (HCC) [N17.9] Non-intractable vomiting with nausea, unspecified vomiting type [R11.2]  Patient states his nausea and vomiting for approximately 3 months. Endorses poor appetite. Denies diarrhea or constipation. Does have an umbilical hernia that surgery is evaluating.   Creatinine of 5.72, started on IV fluids. Creatinine 5.08.    Medications: Outpatient medications: Medications Prior to Admission  Medication Sig Dispense Refill Last Dose  . albuterol (PROVENTIL HFA;VENTOLIN HFA) 108 (90 Base) MCG/ACT inhaler Inhale 2 puffs into the lungs every 6 (six) hours as needed for wheezing or shortness of breath. 1 Inhaler 0   . ALPRAZolam (XANAX) 0.25 MG tablet Take 1 tablet (0.25 mg total) by mouth daily as needed for anxiety (panic attack). 20 tablet 0 Past Week at Unknown time  . amiodarone (PACERONE) 200 MG tablet Take 1 tablet (200 mg total) by mouth daily. 30 tablet 0   . aspirin EC 81 MG tablet Take 81 mg by mouth daily.   Past Month at Unknown time  . azithromycin (ZITHROMAX) 250 MG tablet One tab daily for 4 more days 4 each 0   . citalopram (CELEXA) 20 MG tablet Take 1 tablet by mouth daily.     . cloNIDine (CATAPRES) 0.1  MG tablet Take 1 tablet (0.1 mg total) by mouth 3 (three) times daily. 90 tablet 0   . cloNIDine (CATAPRES) 0.3 MG tablet Take 1 tablet by mouth 3 (three) times daily.     Marland Kitchen FLUoxetine (PROZAC) 20 MG capsule Take 1 capsule (20 mg total) by mouth daily. 30 capsule 0   . furosemide (LASIX) 40 MG tablet Take 1 tablet (40 mg total) by mouth daily. 30 tablet 0   . hydrALAZINE (APRESOLINE) 100 MG tablet Take 1 tablet (100 mg total) by mouth 3 (three) times daily. 90 tablet 1 07/17/2017 at 0800  . hydrochlorothiazide (HYDRODIURIL) 25 MG tablet Take 1 tablet by mouth daily.     Marland Kitchen levofloxacin (LEVAQUIN) 500 MG tablet Take 1 tablet (500 mg total) by mouth daily. 5 tablet 0   . lisinopril (PRINIVIL,ZESTRIL) 10 MG tablet Take 1 tablet (10 mg total) by mouth daily. 30 tablet 0   . lisinopril (PRINIVIL,ZESTRIL) 20 MG tablet Take 1 tablet by mouth daily.     . metoprolol tartrate (LOPRESSOR) 25 MG tablet Take 1 tablet (25 mg total) by mouth 2 (two) times daily. 60 tablet 0   . nitroGLYCERIN (NITROSTAT) 0.4 MG SL tablet Place 1 tablet under the tongue every 5 (five) minutes as needed.     Marland Kitchen  predniSONE (DELTASONE) 10 MG tablet 40mg  daily for 4 more days 16 tablet 0     Current medications: Current Facility-Administered Medications  Medication Dose Route Frequency Provider Last Rate Last Dose  . 0.9 %  sodium chloride infusion   Intravenous Continuous Shaune Pollack, MD 100 mL/hr at 03/27/18 [phone]    . acetaminophen (TYLENOL) tablet 650 mg  650 mg Oral Q6H PRN Shaune Pollack, MD       Or  . acetaminophen (TYLENOL) suppository 650 mg  650 mg Rectal Q6H PRN Shaune Pollack, MD      . albuterol (PROVENTIL) (2.5 MG/3ML) 0.083% nebulizer solution 2.5 mg  2.5 mg Nebulization Q2H PRN Shaune Pollack, MD      . bisacodyl (DULCOLAX) EC tablet 5 mg  5 mg Oral Daily PRN Shaune Pollack, MD      . folic acid (FOLVITE) tablet 1 mg  1 mg Oral Daily Shaune Pollack, MD   1 mg at 03/27/18 0923  . heparin injection 5,000 Units  5,000 Units Subcutaneous  Q8H Shaune Pollack, MD   5,000 Units at 03/27/18 0530  . HYDROcodone-acetaminophen (NORCO/VICODIN) 5-325 MG per tablet 1-2 tablet  1-2 tablet Oral Q4H PRN Shaune Pollack, MD   2 tablet at 03/27/18 0607  . LORazepam (ATIVAN) tablet 1 mg  1 mg Oral Q6H PRN Shaune Pollack, MD       Or  . LORazepam (ATIVAN) injection 1 mg  1 mg Intravenous Q6H PRN Shaune Pollack, MD      . multivitamin with minerals tablet 1 tablet  1 tablet Oral Daily Shaune Pollack, MD   1 tablet at 03/27/18 [id number]  . nicotine (NICODERM CQ - dosed in mg/24 hours) patch 14 mg  14 mg Transdermal Daily Shaune Pollack, MD   14 mg at 03/27/18 5409  . ondansetron (ZOFRAN) tablet 4 mg  4 mg Oral Q6H PRN Shaune Pollack, MD       Or  . ondansetron [facility]) injection 4 mg  4 mg Intravenous Q6H PRN Shaune Pollack, MD      . senna-docusate (Senokot-S) tablet 1 tablet  1 tablet Oral QHS PRN Shaune Pollack, MD      . thiamine (VITAMIN B-1) tablet 100 mg  100 mg Oral Daily Shaune Pollack, MD   100 mg at 03/27/18 8119   Or  . thiamine (B-1) injection 100 mg  100 mg Intravenous Daily Shaune Pollack, MD          Allergies: No Known Allergies    Past Medical History: Past Medical History:  Diagnosis Date  . Anxiety   . CHF (congestive heart failure) (HCC)   . Depression   . Hypersomnia with sleep apnea   . Hypertension   . Morbid obesity (HCC)   . NSTEMI (non-ST elevated myocardial infarction) (HCC) 2005  . Obesity hypoventilation syndrome (HCC)   . Shortness of breath dyspnea   . Sleep apnea   . Stroke (HCC)   . V-tach [facility])      Past Surgical History: Past Surgical History:  Procedure Laterality Date  . TRACHEOSTOMY       Family History: Family History  Problem Relation Age of Onset  . Heart disease Father   . Diabetes type II Mother   . Breast cancer Mother      Social History: Social History   Socioeconomic History  . Marital status: Single    Spouse name: Not on file  . Number of children: Not on file  . Years of education: Not on  file  . Highest  education level: Not on file  Occupational History  . Occupation: disabled  Social Needs  . Financial resource strain: Not on file  . Food insecurity:    Worry: Not on file    Inability: Not on file  . Transportation needs:    Medical: Not on file    Non-medical: Not on file  Tobacco Use  . Smoking status: Current Every Day Smoker    Packs/day: 0.50    Years: 28.00    Pack years: 14.00    Types: Cigarettes  . Smokeless tobacco: Never Used  Substance and Sexual Activity  . Alcohol use: Yes    Alcohol/week: 4.8 oz    Types: 8 Cans of beer per week  . Drug use: No  . Sexual activity: Yes  Lifestyle  . Physical activity:    Days per week: Not on file    Minutes per session: Not on file  . Stress: Not on file  Relationships  . Social connections:    Talks on phone: Not on file    Gets together: Not on file    Attends religious service: Not on file    Active member of club or organization: Not on file    Attends meetings of clubs or organizations: Not on file    Relationship status: Not on file  . Intimate partner violence:    Fear of current or ex partner: Not on file    Emotionally abused: Not on file    Physically abused: Not on file    Forced sexual activity: Not on file  Other Topics Concern  . Not on file  Social History Narrative  . Not on file     Review of Systems: Review of Systems  Constitutional: Negative.  Negative for chills, diaphoresis, fever, malaise/fatigue and weight loss.  HENT: Negative.  Negative for congestion, ear discharge, ear pain, hearing loss, nosebleeds, sinus pain, sore throat and tinnitus.   Eyes: Negative.  Negative for blurred vision, double vision, photophobia, pain, discharge and redness.  Respiratory: Negative.  Negative for cough, hemoptysis, sputum production, shortness of breath, wheezing and stridor.   Cardiovascular: Negative.  Negative for chest pain, palpitations, orthopnea, claudication, leg swelling and PND.   Gastrointestinal: Positive for abdominal pain, nausea and vomiting. Negative for blood in stool, constipation, diarrhea, heartburn and melena.  Genitourinary: Negative.  Negative for dysuria, flank pain, frequency, hematuria and urgency.  Musculoskeletal: Negative.  Negative for back pain, falls, joint pain, myalgias and neck pain.  Skin: Negative.  Negative for itching and rash.  Neurological: Negative.  Negative for dizziness, tingling, tremors, sensory change, speech change, focal weakness, seizures, loss of consciousness, weakness and headaches.  Endo/Heme/Allergies: Negative.  Negative for environmental allergies and polydipsia. Does not bruise/bleed easily.  Psychiatric/Behavioral: Negative.  Negative for depression, hallucinations, memory loss, substance abuse and suicidal ideas. The patient is not nervous/anxious and does not have insomnia.     Vital Signs: Blood pressure 128/85, pulse 99, temperature 97.8 F (36.6 C), temperature source Oral, resp. rate 18, height 5\' 6"  (1.676 m), weight 117.9 kg (260 lb), SpO2 96 %.  Weight trends: Filed Weights   03/26/18 1539  Weight: 117.9 kg (260 lb)    Physical Exam: General: NAD,   Head: Normocephalic, atraumatic. Moist oral mucosal membranes  Eyes: Anicteric, PERRL  Neck: Tracheostomy  Lungs:  Clear to auscultation  Heart: Regular rate and rhythm  Abdomen:  Soft, nontender,   Extremities: no peripheral edema.  Neurologic: Nonfocal,  moving all four extremities  Skin: No lesions         Lab results: Basic Metabolic Panel: Recent Labs  Lab 03/26/18 1548 03/26/18 1947 03/27/18 0453  NA 132*  --  134*  K 3.9  --  3.4*  CL 86*  --  93*  CO2 30  --  31  GLUCOSE 111*  --  122*  BUN 97*  --  93*  CREATININE 5.72* 5.67* 5.08*  CALCIUM 10.0  --  9.3    Liver Function Tests: Recent Labs  Lab 03/26/18 1548  AST 49*  ALT 31  ALKPHOS 85  BILITOT 1.3*  PROT 8.4*  ALBUMIN 4.3   Recent Labs  Lab 03/26/18 1548  LIPASE  65*   No results for input(s): AMMONIA in the last 168 hours.  CBC: Recent Labs  Lab 03/26/18 1548 03/27/18 0453  WBC 4.5 3.8  HGB 15.9 14.0  HCT 46.0 40.8  MCV 96.0 97.2  PLT 314 252    Cardiac Enzymes: Recent Labs  Lab 03/26/18 1548  TROPONINI 0.05*    BNP: Invalid input(s): POCBNP  CBG: No results for input(s): GLUCAP in the last 168 hours.  Microbiology: No results found for this or any previous visit.  Coagulation Studies: No results for input(s): LABPROT, INR in the last 72 hours.  Urinalysis: Recent Labs    03/27/18 0615  COLORURINE YELLOW*  LABSPEC 1.011  PHURINE 5.0  GLUCOSEU NEGATIVE  HGBUR NEGATIVE  BILIRUBINUR NEGATIVE  KETONESUR NEGATIVE  PROTEINUR NEGATIVE  NITRITE NEGATIVE  LEUKOCYTESUR NEGATIVE      Imaging: Ct Abdomen Pelvis Wo Contrast  Result Date: 03/26/2018 CLINICAL DATA:  Nausea and vomiting EXAM: CT ABDOMEN AND PELVIS WITHOUT CONTRAST TECHNIQUE: Multidetector CT imaging of the abdomen and pelvis was performed following the standard protocol without IV contrast. COMPARISON:  None. FINDINGS: Lower chest: Lung bases demonstrate of 5 mm nodule in the right lower lobe, series 3, image number 7. Cardiomegaly. No acute consolidation or effusion. Coronary vascular calcification. Hepatobiliary: No focal hepatic abnormality or biliary dilatation. High density within the gallbladder lumen which may reflect sludge and or stones. Pancreas: Unremarkable. No pancreatic ductal dilatation or surrounding inflammatory changes. Spleen: Normal in size without focal abnormality. Adrenals/Urinary Tract: Adrenal glands are within normal limits. No hydronephrosis. Probable cyst mid left kidney. Bladder nearly empty Stomach/Bowel: Stomach is within normal limits. Appendix not well seen but no right lower quadrant inflammatory process. No evidence of bowel wall thickening, distention, or inflammatory changes. Vascular/Lymphatic: Moderate aortic atherosclerosis. No  aneurysmal dilatation. No significantly enlarged lymph nodes. Reproductive: Prostate is unremarkable. Other: Fat in the inguinal canals. No free air or free fluid. Moderate fat containing umbilical and periumbilical hernia. Musculoskeletal: Degenerative changes. No acute or suspicious abnormality IMPRESSION: 1. No CT evidence for acute intra-abdominal or pelvic abnormality. 2. High density material in the gallbladder suggesting sludge and or stones. No surrounding wall thickening or right upper quadrant inflammation 3. Cardiomegaly 4. 5 mm pulmonary nodule in the right lower lobe. No follow-up needed if patient is low-risk. Non-contrast chest CT can be considered in 12 months if patient is high-risk. This recommendation follows the consensus statement: Guidelines for Management of Incidental Pulmonary Nodules Detected on CT Images: From the Fleischner Society 2017; Radiology 2017; 284:228-243. 5. Moderate fat containing umbilical and periumbilical hernia. Electronically Signed   By: Jasmine PangKim  Fujinaga M.D.   On: 03/26/2018 18:41   Dg Chest 2 View  Result Date: 03/26/2018 CLINICAL DATA:  Postprandial vomiting. Tracheostomy patient. Previous  CVA. Current smoker. History of CHF EXAM: CHEST - 2 VIEW COMPARISON:  Chest x-ray of December 17, 2017 FINDINGS: The lungs are adequately inflated. The lung markings are mildly increased at the left base. There is no discrete infiltrate and no pleural effusion. The cardiac silhouette is enlarged. The central pulmonary vascularity is prominent. The tracheostomy tube tip projects at the inferior margin of the clavicular head. IMPRESSION: Chronic bronchitic-smoking related changes. Possible subsegmental atelectasis at the left lung base but there is overlap of costal cartilages and posterior ribs which accentuates density here. Enlargement of the cardiac silhouette without pulmonary vascular congestion or pulmonary edema. Electronically Signed   By: David  [place] M.D.   On: 03/26/2018  16:21   US Renal  Result Date: 03/27/2018 CLINICAL DATA:  Acute renal failure EXAM: RENAL / URINARY TRACT ULTRASOUND COMPLETE COMPARISON:  CT abdomen pelvis 03/26/2018 FINDINGS: Right Kidney: Length: 12.5 cm. 1 cm upper pole cyst. Negative for hydronephrosis. Normal cortical echogenicity. No mass lesion. Left Kidney: Length: 11.5 cm. 17 mm left lower pole cyst. Echogenicity within normal limits. No mass or hydronephrosis visualized. Bladder: Appears normal for degree of bladder distention. IMPRESSION: Negative renal ultrasound. Electronically Signed   By: Marlan Palau M.D.   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 black male with hypertension, atrial fibrillation, obstructive sleep apnea status post tracheostomy, congestive heart failure, coronary artery disease, cerebrovascular accident, depression, tobacco use, who was admitted to [facility] on 03/26/2018 for Uremia [N19] Atrial fibrillation with RVR (HCC) [I48.91] Acute renal failure, unspecified acute renal failure type (HCC) [N17.9] Non-intractable vomiting with nausea, unspecified vomiting type [R11.2]  1. Acute renal failure: with baseline creatinine of 1.02, GFR within normal limits. 12/17/17.  Secondary to prerenal azotemia.  Ultrasound and CT reviewed.  - Continue IV fluids.   2. Hypertension: blood pressure at goal today. Currently off his home blood pressure regimen.  - holding lisinopril, hydrochlorothiazide and furosemide.   3. Hyponatremia and hypokalemia: secondary to hypovolemia. Replace as necessary.  LOS: 1 Ethyn Schetter 4/24/201911:10 AM

## 2018-03-28 LAB — RENAL FUNCTION PANEL
ALBUMIN: 3.7 g/dL (ref 3.5–5.0)
ANION GAP: 9 (ref 5–15)
BUN: 73 mg/dL — ABNORMAL HIGH (ref 6–20)
CALCIUM: 9.3 mg/dL (ref 8.9–10.3)
CO2: 29 mmol/L (ref 22–32)
Chloride: 97 mmol/L — ABNORMAL LOW (ref 101–111)
Creatinine, Ser: 2.56 mg/dL — ABNORMAL HIGH (ref 0.61–1.24)
GFR calc non Af Amer: 27 mL/min — ABNORMAL LOW (ref >60)
GFR, EST AFRICAN AMERICAN: 32 mL/min — AB (ref >60)
Glucose, Bld: 100 mg/dL — ABNORMAL HIGH (ref 65–99)
PHOSPHORUS: 3.8 mg/dL (ref 2.5–4.6)
POTASSIUM: 3.8 mmol/L (ref 3.5–5.1)
SODIUM: 135 mmol/L (ref 135–145)

## 2018-03-28 MED ORDER — HYDRALAZINE HCL 100 MG PO TABS: 100.0000 mg | ORAL_TABLET | Freq: Three times a day (TID) | ORAL | Status: DC

## 2018-03-28 MED ORDER — HYDRALAZINE HCL 50 MG PO TABS
100.0000 mg | ORAL_TABLET | Freq: Three times a day (TID) | ORAL | Status: DC
  Administered 2018-03-28 – 2018-03-29 (×5): 100 mg via ORAL
  Filled 2018-03-28 (×5): qty 2

## 2018-03-28 MED ORDER — CLONIDINE HCL 0.1 MG PO TABS
0.1000 mg | ORAL_TABLET | Freq: Three times a day (TID) | ORAL | Status: DC
  Administered 2018-03-28 – 2018-03-29 (×5): 0.1 mg via ORAL
  Filled 2018-03-28 (×5): qty 1

## 2018-03-28 NOTE — Progress Notes (Signed)
Notified dr.maier of pt b/p. Acknowledged and no new orders

## 2018-03-28 NOTE — Progress Notes (Signed)
[employer] [facility] [place] at [facility]   PATIENT NAME: Andrew Sparks    MR#:  [id number]  DATE OF BIRTH:  04-28-1966  SUBJECTIVE:   Pt. renal function is much improved today.  Patient has less abdominal pain, denies any nausea or vomiting.  Patient sitting up eating breakfast.  REVIEW OF SYSTEMS:    Review of Systems  Constitutional: Negative for chills and fever.  HENT: Negative for congestion and tinnitus.   Eyes: Negative for blurred vision and double vision.  Respiratory: Negative for cough, shortness of breath and wheezing.   Cardiovascular: Negative for chest pain, orthopnea and PND.  Gastrointestinal: Negative for abdominal pain, diarrhea, nausea and vomiting.  Genitourinary: Negative for dysuria and hematuria.  Neurological: Negative for dizziness, sensory change and focal weakness.  All other systems reviewed and are negative.   Nutrition: Heart healthy Tolerating Diet: Yes Tolerating PT: Await Eval.   DRUG ALLERGIES:  No Known Allergies  VITALS:  Blood pressure 134/88, pulse 88, temperature 98.5 F (36.9 C), temperature source Oral, resp. rate 20, height 5\' 6"  (1.676 m), weight 117.9 kg (260 lb), SpO2 100 %.  PHYSICAL EXAMINATION:   Physical Exam  GENERAL:  52 y.o.-year-old obese patient lying in bed in no acute distress.  EYES: Pupils equal, round, reactive to light and accommodation. No scleral icterus. Extraocular muscles intact.  HEENT: Head atraumatic, normocephalic. Oropharynx and nasopharynx clear. + Trach in place NECK:  Supple, no jugular venous distention. No thyroid enlargement, no tenderness.  LUNGS: Normal breath sounds bilaterally, no wheezing, rales, rhonchi. No use of accessory muscles of respiration.  CARDIOVASCULAR: S1, S2 normal. No murmurs, rubs, or gallops.  ABDOMEN: Soft, nontender, nondistended. Bowel sounds present. No organomegaly or mass.  peri-umbilical hernia but no incarcerated.  EXTREMITIES: No cyanosis, clubbing or  edema b/l.    NEUROLOGIC: Cranial nerves II through XII are intact. No focal Motor or sensory deficits b/l. Globally weak.    PSYCHIATRIC: The patient is alert and oriented x 3.  SKIN: No obvious rash, lesion, or ulcer.    LABORATORY PANEL:   CBC Recent Labs  Lab 03/27/18 0453  WBC 3.8  HGB 14.0  HCT 40.8  PLT 252   ------------------------------------------------------------------------------------------------------------------  Chemistries  Recent Labs  Lab 03/26/18 1548  03/28/18 0702  NA 132*   < > 135  K 3.9   < > 3.8  CL 86*   < > 97*  CO2 30   < > 29  GLUCOSE 111*   < > 100*  BUN 97*   < > 73*  CREATININE 5.72*   < > 2.56*  CALCIUM 10.0   < > 9.3  AST 49*  --   --   ALT 31  --   --   ALKPHOS 85  --   --   BILITOT 1.3*  --   --    < > = values in this interval not displayed.   ------------------------------------------------------------------------------------------------------------------  Cardiac Enzymes Recent Labs  Lab 03/26/18 1548  TROPONINI 0.05*   ------------------------------------------------------------------------------------------------------------------  RADIOLOGY:  Ct Abdomen Pelvis Wo Contrast  Result Date: 03/26/2018 CLINICAL DATA:  Nausea and vomiting EXAM: CT ABDOMEN AND PELVIS WITHOUT CONTRAST TECHNIQUE: Multidetector CT imaging of the abdomen and pelvis was performed following the standard protocol without IV contrast. COMPARISON:  None. FINDINGS: Lower chest: Lung bases demonstrate of 5 mm nodule in the right lower lobe, series 3, image number 7. Cardiomegaly. No acute consolidation or effusion. Coronary vascular calcification. Hepatobiliary: No focal hepatic  abnormality or biliary dilatation. High density within the gallbladder lumen which may reflect sludge and or stones. Pancreas: Unremarkable. No pancreatic ductal dilatation or surrounding inflammatory changes. Spleen: Normal in size without focal abnormality. Adrenals/Urinary Tract:  Adrenal glands are within normal limits. No hydronephrosis. Probable cyst mid left kidney. Bladder nearly empty Stomach/Bowel: Stomach is within normal limits. Appendix not well seen but no right lower quadrant inflammatory process. No evidence of bowel wall thickening, distention, or inflammatory changes. Vascular/Lymphatic: Moderate aortic atherosclerosis. No aneurysmal dilatation. No significantly enlarged lymph nodes. Reproductive: Prostate is unremarkable. Other: Fat in the inguinal canals. No free air or free fluid. Moderate fat containing umbilical and periumbilical hernia. Musculoskeletal: Degenerative changes. No acute or suspicious abnormality IMPRESSION: 1. No CT evidence for acute intra-abdominal or pelvic abnormality. 2. High density material in the gallbladder suggesting sludge and or stones. No surrounding wall thickening or right upper quadrant inflammation 3. Cardiomegaly 4. 5 mm pulmonary nodule in the right lower lobe. No follow-up needed if patient is low-risk. Non-contrast chest CT can be considered in 12 months if patient is high-risk. This recommendation follows the consensus statement: Guidelines for Management of Incidental Pulmonary Nodules Detected on CT Images: From the Fleischner Society 2017; Radiology 2017; 284:228-243. 5. Moderate fat containing umbilical and periumbilical hernia. Electronically Signed   By: Jasmine Pang M.D.   On: 03/26/2018 18:41   Dg Chest 2 View  Result Date: 03/26/2018 CLINICAL DATA:  Postprandial vomiting. Tracheostomy patient. Previous CVA. Current smoker. History of CHF EXAM: CHEST - 2 VIEW COMPARISON:  Chest x-ray of December 17, 2017 FINDINGS: The lungs are adequately inflated. The lung markings are mildly increased at the left base. There is no discrete infiltrate and no pleural effusion. The cardiac silhouette is enlarged. The central pulmonary vascularity is prominent. The tracheostomy tube tip projects at the inferior margin of the clavicular head.  IMPRESSION: Chronic bronchitic-smoking related changes. Possible subsegmental atelectasis at the left lung base but there is overlap of costal cartilages and posterior ribs which accentuates density here. Enlargement of the cardiac silhouette without pulmonary vascular congestion or pulmonary edema. Electronically Signed   By: David  Swaz[place] M.D.   On: 03/26/2018 16:21   US Renal  Result Date: 03/27/2018 CLINICAL DATA:  Acute renal failure EXAM: RENAL / URINARY TRACT ULTRASOUND COMPLETE COMPARISON:  CT abdomen pelvis 03/26/2018 FINDINGS: Right Kidney: Length: 12.5 cm. 1 cm upper pole cyst. Negative for hydronephrosis. Normal cortical echogenicity. No mass lesion. Left Kidney: Length: 11.5 cm. 17 mm left lower pole cyst. Echogenicity within normal limits. No mass or hydronephrosis visualized. Bladder: Appears normal for degree of bladder distention. IMPRESSION: Negative renal ultrasound. Electronically Signed   By: Marlan Palau M.D.   On: 03/27/2018 11:07     ASSESSMENT AND PLAN:   52 history of hypertension, previous CVA, obesity pickwickian syndrome, CHF, anxiety who presented to the hospital due to vague abdominal pain also noted to be in acute kidney injury.  1.  Acute renal failure-suspected to be prerenal in nature secondary to ongoing nausea vomiting. -Patient's renal ultrasound is negative for hydronephrosis or obstruction.  Patient is voiding. -Continue to hold diuretics, continue IV fluids.   -Creatinine improved since yesterday and will continue to monitor.  Appreciate nephrology input.  2.  Abdominal pain- improved since yesterday.  Thought to be secondary to incarcerated umbilical hernia but CT scan showing no evidence of incarceration.  Patient's hernia is easily reducible.  Seen by general surgery and no plans for acute surgical intervention.  Continue supportive care for now.  Patient is tolerating p.o. well and had a bowel movement yesterday.  3.  Chronic respiratory  failure-secondary to obesity pickwickian syndrome.  Patient is status post tracheostomy. -Continue trach care per protocol.  Will refer him to ENT as outpatient for getting his trach changed.    4.  History of alcohol abuse-continue CIWA protocol.  No evidence of alcohol withdrawal.  5.  Anxiety/depression-continue Xanax, Prozac.  Continue Celexa.  6.  Essential hypertension-continue clonidine, hydralazine.  7.  Tobacco abuse-continue nicotine patch.  Encourage patient to ambulate, possible discharge home tomorrow if renal function is improving.  All the records are reviewed and case discussed with Care Management/Social Worker. Management plans discussed with the patient, family and they are in agreement.  CODE STATUS: Full code  DVT Prophylaxis: Heparin subcutaneous  TOTAL TIME TAKING CARE OF THIS PATIENT: 25 minutes.   POSSIBLE D/C IN 1-2 DAYS, DEPENDING ON CLINICAL CONDITION.   Houston SirenSAINANI,VIVEK J M.D on 03/28/2018 at 1:31 PM  Between 7am to 6pm - Pager - [phone]  After 6pm go to [email or web address] - Social research officer, [email or web address] [email or web address]  [employer] [facility]  Office  [phone]  CC: Primary care physician; Margaretann LovelessKhan, Neelam S, MD

## 2018-03-28 NOTE — Progress Notes (Signed)
[facility]  ROUNDING NOTE   Subjective:   Did not finish breakfast  NS at 178mL/hr  Creatinine 2.56 (5.08)  Objective:  Vital signs in last 24 hours:  Temp:  [98 F (36.7 C)-98.5 F (36.9 C)] 98.5 F (36.9 C) (04/25 1012) Pulse Rate:  [61-88] 88 (04/25 1012) Resp:  [18-20] 20 (04/25 0538) BP: (113-177)/(79-133) 134/88 (04/25 1012) SpO2:  [99 %-100 %] 100 % (04/25 1012)  Weight change:  Filed Weights   03/26/18 1539  Weight: 117.9 kg (260 lb)    Intake/Output: I/O last 3 completed shifts: In: 4864 [P.O.:1200; I.V.:3664] Out: 1675 [Urine:1675]   Intake/Output this shift:  Total I/O In: 1184 [I.V.:1184] Out: 225 [Urine:225]  Physical Exam: General: NAD, laying in bed  Head: Normocephalic, atraumatic. Moist oral mucosal membranes  Eyes: Anicteric, PERRL  Neck: + tracheostomy  Lungs:  Clear to auscultation  Heart: Regular rate and rhythm  Abdomen:  Soft, nontender,   Extremities:  no peripheral edema.  Neurologic: Nonfocal, moving all four extremities  Skin: No lesions        Basic Metabolic Panel: Recent Labs  Lab 03/26/18 1548 03/26/18 1947 03/27/18 0453 03/28/18 0702  NA 132*  --  134* 135  K 3.9  --  3.4* 3.8  CL 86*  --  93* 97*  CO2 30  --  31 29  GLUCOSE 111*  --  122* 100*  BUN 97*  --  93* 73*  CREATININE 5.72* 5.67* 5.08* 2.56*  CALCIUM 10.0  --  9.3 9.3  PHOS  --   --   --  3.8    Liver Function Tests: Recent Labs  Lab 03/26/18 1548 03/28/18 0702  AST 49*  --   ALT 31  --   ALKPHOS 85  --   BILITOT 1.3*  --   PROT 8.4*  --   ALBUMIN 4.3 3.7   Recent Labs  Lab 03/26/18 1548  LIPASE 65*   No results for input(s): AMMONIA in the last 168 hours.  CBC: Recent Labs  Lab 03/26/18 1548 03/27/18 0453  WBC 4.5 3.8  HGB 15.9 14.0  HCT 46.0 40.8  MCV 96.0 97.2  PLT 314 252    Cardiac Enzymes: Recent Labs  Lab 03/26/18 1548  TROPONINI 0.05*    BNP: Invalid input(s): POCBNP  CBG: No results for  input(s): GLUCAP in the last 168 hours.  Microbiology: No results found for this or any previous visit.  Coagulation Studies: No results for input(s): LABPROT, INR in the last 72 hours.  Urinalysis: Recent Labs    03/27/18 0615  COLORURINE YELLOW*  LABSPEC 1.011  PHURINE 5.0  GLUCOSEU NEGATIVE  HGBUR NEGATIVE  BILIRUBINUR NEGATIVE  KETONESUR NEGATIVE  PROTEINUR NEGATIVE  NITRITE NEGATIVE  LEUKOCYTESUR NEGATIVE      Imaging: Ct Abdomen Pelvis Wo Contrast  Result Date: 03/26/2018 CLINICAL DATA:  Nausea and vomiting EXAM: CT ABDOMEN AND PELVIS WITHOUT CONTRAST TECHNIQUE: Multidetector CT imaging of the abdomen and pelvis was performed following the standard protocol without IV contrast. COMPARISON:  None. FINDINGS: Lower chest: Lung bases demonstrate of 5 mm nodule in the right lower lobe, series 3, image number 7. Cardiomegaly. No acute consolidation or effusion. Coronary vascular calcification. Hepatobiliary: No focal hepatic abnormality or biliary dilatation. High density within the gallbladder lumen which may reflect sludge and or stones. Pancreas: Unremarkable. No pancreatic ductal dilatation or surrounding inflammatory changes. Spleen: Normal in size without focal abnormality. Adrenals/Urinary Tract: Adrenal glands are within normal limits. No  hydronephrosis. Probable cyst mid left kidney. Bladder nearly empty Stomach/Bowel: Stomach is within normal limits. Appendix not well seen but no right lower quadrant inflammatory process. No evidence of bowel wall thickening, distention, or inflammatory changes. Vascular/Lymphatic: Moderate aortic atherosclerosis. No aneurysmal dilatation. No significantly enlarged lymph nodes. Reproductive: Prostate is unremarkable. Other: Fat in the inguinal canals. No free air or free fluid. Moderate fat containing umbilical and periumbilical hernia. Musculoskeletal: Degenerative changes. No acute or suspicious abnormality IMPRESSION: 1. No CT evidence for  acute intra-abdominal or pelvic abnormality. 2. High density material in the gallbladder suggesting sludge and or stones. No surrounding wall thickening or right upper quadrant inflammation 3. Cardiomegaly 4. 5 mm pulmonary nodule in the right lower lobe. No follow-up needed if patient is low-risk. Non-contrast chest CT can be considered in 12 months if patient is high-risk. This recommendation follows the consensus statement: Guidelines for Management of Incidental Pulmonary Nodules Detected on CT Images: From the Fleischner Society 2017; Radiology 2017; 284:228-243. 5. Moderate fat containing umbilical and periumbilical hernia. Electronically Signed   By: Jasmine Pang M.D.   On: 03/26/2018 18:41   Dg Chest 2 View  Result Date: 03/26/2018 CLINICAL DATA:  Postprandial vomiting. Tracheostomy patient. Previous CVA. Current smoker. History of CHF EXAM: CHEST - 2 VIEW COMPARISON:  Chest x-ray of December 17, 2017 FINDINGS: The lungs are adequately inflated. The lung markings are mildly increased at the left base. There is no discrete infiltrate and no pleural effusion. The cardiac silhouette is enlarged. The central pulmonary vascularity is prominent. The tracheostomy tube tip projects at the inferior margin of the clavicular head. IMPRESSION: Chronic bronchitic-smoking related changes. Possible subsegmental atelectasis at the left lung base but there is overlap of costal cartilages and posterior ribs which accentuates density here. Enlargement of the cardiac silhouette without pulmonary vascular congestion or pulmonary edema. Electronically Signed   By: David  [place] M.D.   On: 03/26/2018 16:21   US Renal  Result Date: 03/27/2018 CLINICAL DATA:  Acute renal failure EXAM: RENAL / URINARY TRACT ULTRASOUND COMPLETE COMPARISON:  CT abdomen pelvis 03/26/2018 FINDINGS: Right Kidney: Length: 12.5 cm. 1 cm upper pole cyst. Negative for hydronephrosis. Normal cortical echogenicity. No mass lesion. Left Kidney: Length:  11.5 cm. 17 mm left lower pole cyst. Echogenicity within normal limits. No mass or hydronephrosis visualized. Bladder: Appears normal for degree of bladder distention. IMPRESSION: Negative renal ultrasound. Electronically Signed   By: Marlan Palau M.D.   On: 03/27/2018 11:07     Medications:   . sodium chloride 100 mL/hr at 03/28/18 0916   . amiodarone  200 mg Oral Daily  . aspirin EC  81 mg Oral Daily  . citalopram  20 mg Oral Daily  . cloNIDine  0.1 mg Oral TID  . FLUoxetine  20 mg Oral Daily  . folic acid  1 mg Oral Daily  . heparin  5,000 Units Subcutaneous Q8H  . hydrALAZINE  100 mg Oral TID  . multivitamin with minerals  1 tablet Oral Daily  . nicotine  14 mg Transdermal Daily  . thiamine  100 mg Oral Daily   Or  . thiamine  100 mg Intravenous Daily   acetaminophen **OR** acetaminophen, albuterol, ALPRAZolam, bisacodyl, HYDROcodone-acetaminophen, LORazepam **OR** LORazepam, ondansetron **OR** ondansetron (ZOFRAN) IV, senna-docusate  Assessment/ Plan:  [name] is a 52 y.o. black male [name] is a 52 y.o. black male with hypertension, atrial fibrillation, obstructive sleep apnea status post tracheostomy, congestive heart failure, coronary artery  disease, cerebrovascular accident, depression, tobacco use, who [facility] for acute renal failure  1. Acute renal failure: with baseline creatinine of 1.02, GFR within normal limits. 12/17/17.  Secondary to prerenal azotemia from nausea and poor appetite.  - Creatinine and electrolytes improving.  - Continue IV fluids. NS at 17800mL/hr - Encourage symptom management and PO intake.   2. Hypertension: blood pressure at goal today. Currently off his home blood pressure regimen.  - Started on hydralazine - holding lisinopril, hydrochlorothiazide and furosemide.   3. Hyponatremia and hypokalemia: improved with replacement   LOS: 2 Mariana Wiederholt 4/25/201910:32 AM

## 2018-03-28 NOTE — Consult Note (Signed)
Reason for Consult: Atrial fibrillation rapid ventricular response Referring Physician: Dr. Bridgett Larsson hospitalist, Dr. Lamonte Sakai primary Dr. Rennis Harding cardiologist  Andrew Sparks is an 52 y.o. male.  HPI: 52 year old obese black male history of severe obstructive sleep apnea requiring a permanent trach hypertension diastolic heart failure obesity previous stroke reported myocardial infarction in the past anxiety depression presented with significant abdominal discomfort with nausea and vomiting over the last several days.  Patient was found to be in acute renal failure with creatinine of 5.7 when in fact it was normal less than 3 months ago.. Patient was thought to be dehydrated secondary to significant nausea vomiting denies any significant diarrhea.  Patient denies any chest pain has had significant palpitations or tachycardia EKG suggested atrial fibrillation.  Patient also has history of alcoholism and some noncompliance he found to have elevated troponin cardiology was consulted  Past Medical History:  Diagnosis Date  . Anxiety   . CHF (congestive heart failure) ([place])   . Depression   . Hypersomnia with sleep apnea   . Hypertension   . Morbid obesity ([place])   . NSTEMI (non-ST elevated myocardial infarction) ([place]) 2005  . Obesity hypoventilation syndrome ([place])   . Shortness of breath dyspnea   . Sleep apnea   . Stroke ([place])   . V-tach [facility])     Past Surgical History:  Procedure Laterality Date  . TRACHEOSTOMY      Family History  Problem Relation Age of Onset  . Heart disease Father   . Diabetes type II Mother   . Breast cancer Mother     Social History:  reports that he has been smoking cigarettes.  He has a 14.00 pack-year smoking history. He has never used smokeless tobacco. He reports that he drinks about 4.8 oz of alcohol per week. He reports that he does not use drugs.  Allergies: No Known Allergies  Medications: I have reviewed the patient's current  medications.  Results for orders placed or performed during the hospital encounter of 03/26/18 (from the past 48 hour(s))  Lipase, blood     Status: Abnormal   Collection Time: 03/26/18  3:48 PM  Result Value Ref Range   Lipase 65 (H) 11 - 51 U/L    Comment: Performed at [facility], [address] [place], [place] [address]  Comprehensive metabolic panel     Status: Abnormal   Collection Time: 03/26/18  3:48 PM  Result Value Ref Range   Sodium 132 (L) 135 - 145 mmol/L   Potassium 3.9 3.5 - 5.1 mmol/L   Chloride 86 (L) 101 - 111 mmol/L   CO2 30 22 - 32 mmol/L   Glucose, Bld 111 (H) 65 - 99 mg/dL   BUN 97 (H) 6 - 20 mg/dL   Creatinine, Ser 5.72 (H) 0.61 - 1.24 mg/dL   Calcium 10.0 8.9 - 10.3 mg/dL   Total Protein 8.4 (H) 6.5 - 8.1 g/dL   Albumin 4.3 3.5 - 5.0 g/dL   AST 49 (H) 15 - 41 U/L   ALT 31 17 - 63 U/L   Alkaline Phosphatase 85 38 - 126 U/L   Total Bilirubin 1.3 (H) 0.3 - 1.2 mg/dL   GFR calc non Af Amer 10 (L) >60 mL/min   GFR calc Af Amer 12 (L) >60 mL/min    Comment: (NOTE) The eGFR has been calculated using the CKD EPI equation. This calculation has not been validated in all clinical situations. eGFR's persistently <60 mL/min signify possible  Chronic Kidney Disease.    Anion gap 16 (H) 5 - 15    Comment: Performed at [facility], [address] [place], [place] [address]  CBC     Status: None   Collection Time: 03/26/18  3:48 PM  Result Value Ref Range   WBC 4.5 3.8 - 10.6 K/uL   RBC 4.79 4.40 - 5.90 MIL/uL   Hemoglobin 15.9 13.0 - 18.0 g/dL   HCT 46.0 40.0 - 52.0 %   MCV 96.0 80.0 - 100.0 fL   MCH 33.1 26.0 - 34.0 pg   MCHC 34.5 32.0 - 36.0 g/dL   RDW 14.5 11.5 - 14.5 %   Platelets 314 150 - 440 K/uL    Comment: Performed at [facility], [address] [place], [place] [address]  Troponin I     Status: Abnormal   Collection Time: 03/26/18  3:48 PM  Result Value Ref Range   Troponin I 0.05 (HH) <0.03 ng/mL    Comment:  CRITICAL RESULT CALLED TO, READ BACK BY AND VERIFIED WITH PAULETTE WYATT AT 7673 03/26/2018.  TFK Performed at [facility], [address] [place], [place] [address]   Creatinine, serum     Status: Abnormal   Collection Time: 03/26/18  7:47 PM  Result Value Ref Range   Creatinine, Ser 5.67 (H) 0.61 - 1.24 mg/dL   GFR calc non Af Amer 10 (L) >60 mL/min   GFR calc Af Amer 12 (L) >60 mL/min    Comment: (NOTE) The eGFR has been calculated using the CKD EPI equation. This calculation has not been validated in all clinical situations. eGFR's persistently <60 mL/min signify possible Chronic Kidney Disease. Performed at [facility], [address] [place], [place] [address]   Hemoglobin A1c     Status: Abnormal   Collection Time: 03/26/18  7:47 PM  Result Value Ref Range   Hgb A1c MFr Bld 6.6 (H) 4.8 - 5.6 %    Comment: (NOTE) Pre diabetes:          5.7%-6.4% Diabetes:              >6.4% Glycemic control for   <7.0% adults with diabetes    Mean Plasma Glucose 142.72 mg/dL    Comment: Performed at [facility] [address] [place], [place] [address]  Basic metabolic panel     Status: Abnormal   Collection Time: 03/27/18  4:53 AM  Result Value Ref Range   Sodium 134 (L) 135 - 145 mmol/L   Potassium 3.4 (L) 3.5 - 5.1 mmol/L   Chloride 93 (L) 101 - 111 mmol/L   CO2 31 22 - 32 mmol/L   Glucose, Bld 122 (H) 65 - 99 mg/dL   BUN 93 (H) 6 - 20 mg/dL    Comment: RESULT CONFIRMED BY MANUAL DILUTION SDR   Creatinine, Ser 5.08 (H) 0.61 - 1.24 mg/dL   Calcium 9.3 8.9 - 10.3 mg/dL   GFR calc non Af Amer 12 (L) >60 mL/min   GFR calc Af Amer 14 (L) >60 mL/min    Comment: (NOTE) The eGFR has been calculated using the CKD EPI equation. This calculation has not been validated in all clinical situations. eGFR's persistently <60 mL/min signify possible Chronic Kidney Disease.    Anion gap 10 5 - 15    Comment: Performed at [facility], [address] [place],  [address]  CBC     Status: Abnormal   Collection  Time: 03/27/18  4:53 AM  Result Value Ref Range   WBC 3.8 3.8 - 10.6 K/uL   RBC 4.19 (L) 4.40 - 5.90 MIL/uL   Hemoglobin 14.0 13.0 - 18.0 g/dL   HCT 40.8 40.0 - 52.0 %   MCV 97.2 80.0 - 100.0 fL   MCH 33.5 26.0 - 34.0 pg   MCHC 34.5 32.0 - 36.0 g/dL   RDW 14.7 (H) 11.5 - 14.5 %   Platelets 252 150 - 440 K/uL    Comment: Performed at [facility], [address] [place], [place] [address]  Urinalysis, Complete w Microscopic     Status: Abnormal   Collection Time: 03/27/18  6:15 AM  Result Value Ref Range   Color, Urine YELLOW (A) YELLOW   APPearance CLEAR (A) CLEAR   Specific Gravity, Urine 1.011 1.005 - 1.030   pH 5.0 5.0 - 8.0   Glucose, UA NEGATIVE NEGATIVE mg/dL   Hgb urine dipstick NEGATIVE NEGATIVE   Bilirubin Urine NEGATIVE NEGATIVE   Ketones, ur NEGATIVE NEGATIVE mg/dL   Protein, ur NEGATIVE NEGATIVE mg/dL   Nitrite NEGATIVE NEGATIVE   Leukocytes, UA NEGATIVE NEGATIVE   RBC / HPF 0-5 0 - 5 RBC/hpf   WBC, UA 0-5 0 - 5 WBC/hpf   Bacteria, UA NONE SEEN NONE SEEN   Squamous Epithelial / LPF 0-5 0 - 5    Comment: Please note change in reference range.   Mucus PRESENT    Hyaline Casts, UA PRESENT     Comment: Performed at [facility], [place], [place] [address]    Ct Abdomen Pelvis Wo Contrast  Result Date: 03/26/2018 CLINICAL DATA:  Nausea and vomiting EXAM: CT ABDOMEN AND PELVIS WITHOUT CONTRAST TECHNIQUE: Multidetector CT imaging of the abdomen and pelvis was performed following the standard protocol without IV contrast. COMPARISON:  None. FINDINGS: Lower chest: Lung bases demonstrate of 5 mm nodule in the right lower lobe, series 3, image number 7. Cardiomegaly. No acute consolidation or effusion. Coronary vascular calcification. Hepatobiliary: No focal hepatic abnormality or biliary dilatation. High density within the gallbladder lumen which may reflect sludge and  or stones. Pancreas: Unremarkable. No pancreatic ductal dilatation or surrounding inflammatory changes. Spleen: Normal in size without focal abnormality. Adrenals/Urinary Tract: Adrenal glands are within normal limits. No hydronephrosis. Probable cyst mid left kidney. Bladder nearly empty Stomach/Bowel: Stomach is within normal limits. Appendix not well seen but no right lower quadrant inflammatory process. No evidence of bowel wall thickening, distention, or inflammatory changes. Vascular/Lymphatic: Moderate aortic atherosclerosis. No aneurysmal dilatation. No significantly enlarged lymph nodes. Reproductive: Prostate is unremarkable. Other: Fat in the inguinal canals. No free air or free fluid. Moderate fat containing umbilical and periumbilical hernia. Musculoskeletal: Degenerative changes. No acute or suspicious abnormality IMPRESSION: 1. No CT evidence for acute intra-abdominal or pelvic abnormality. 2. High density material in the gallbladder suggesting sludge and or stones. No surrounding wall thickening or right upper quadrant inflammation 3. Cardiomegaly 4. 5 mm pulmonary nodule in the right lower lobe. No follow-up needed if patient is low-risk. Non-contrast chest CT can be considered in 12 months if patient is high-risk. This recommendation follows the consensus statement: Guidelines for Management of Incidental Pulmonary Nodules Detected on CT Images: From the Fleischner Society 2017; Radiology 2017; 284:228-243. 5. Moderate fat containing umbilical and periumbilical hernia. Electronically Signed   By: Donavan Foil M.D.   On: 03/26/2018 18:41   Dg Chest 2 View  Result Date: 03/26/2018 CLINICAL DATA:  Postprandial  vomiting. Tracheostomy patient. Previous CVA. Current smoker. History of CHF EXAM: CHEST - 2 VIEW COMPARISON:  Chest x-ray of December 17, 2017 FINDINGS: The lungs are adequately inflated. The lung markings are mildly increased at the left base. There is no discrete infiltrate and no pleural  effusion. The cardiac silhouette is enlarged. The central pulmonary vascularity is prominent. The tracheostomy tube tip projects at the inferior margin of the clavicular head. IMPRESSION: Chronic bronchitic-smoking related changes. Possible subsegmental atelectasis at the left lung base but there is overlap of costal cartilages and posterior ribs which accentuates density here. Enlargement of the cardiac silhouette without pulmonary vascular congestion or pulmonary edema. Electronically Signed   By: David  Martinique M.D.   On: 03/26/2018 16:21   US Renal  Result Date: 03/27/2018 CLINICAL DATA:  Acute renal failure EXAM: RENAL / URINARY TRACT ULTRASOUND COMPLETE COMPARISON:  CT abdomen pelvis 03/26/2018 FINDINGS: Right Kidney: Length: 12.5 cm. 1 cm upper pole cyst. Negative for hydronephrosis. Normal cortical echogenicity. No mass lesion. Left Kidney: Length: 11.5 cm. 17 mm left lower pole cyst. Echogenicity within normal limits. No mass or hydronephrosis visualized. Bladder: Appears normal for degree of bladder distention. IMPRESSION: Negative renal ultrasound. Electronically Signed   By: Franchot Gallo M.D.   On: 03/27/2018 11:07    Review of Systems  Constitutional: Positive for diaphoresis and malaise/fatigue.  HENT: Positive for congestion and sinus pain.   Eyes: Negative.   Respiratory: Positive for cough and shortness of breath.   Cardiovascular: Positive for chest pain, palpitations, leg swelling and PND.  Gastrointestinal: Negative.   Genitourinary: Negative.   Musculoskeletal: Positive for myalgias.  Skin: Negative.   Neurological: Positive for weakness.  Endo/Heme/Allergies: Negative.   Psychiatric/Behavioral: Negative.    Blood pressure (!) 146/112, pulse 77, temperature 98.1 F (36.7 C), temperature source Oral, resp. rate 20, height _0  (1.676 m), weight 260 lb (117.9 kg), SpO2 100 %. Physical Exam  Nursing note and vitals reviewed. Constitutional: He is oriented to person,  place, and time. He appears well-developed and well-nourished.  HENT:  Head: Normocephalic and atraumatic.  Eyes: Pupils are equal, round, and reactive to light. Conjunctivae and EOM are normal.  Neck:  Permanent trach  Cardiovascular: Normal rate, regular rhythm and normal heart sounds.  Respiratory: Effort normal and breath sounds normal.  GI: Soft. Bowel sounds are normal.  Musculoskeletal: Normal range of motion.  Neurological: He is alert and oriented to person, place, and time. He has normal reflexes.  Skin: Skin is warm and dry.  Psychiatric: He has a normal mood and affect.    Assessment/Plan: Nausea vomiting Hypertension Abdominal pain Congestive heart failure diastolic dysfunction Obstructive sleep apnea History of CVA Previous myocardial infarction Permanent trach Atrial fibrillation Elevated troponin Acute renal failure . Plan Agree with therapy for emesis and nausea vomiting Recommend GI input for abdominal pain and nausea Continue current therapy for diastolic heart failure Pain therapy for sleep apnea through permanent trach Continue hypertension management and control Recommend weight loss exercise portion control Continue therapy for anxiety and depression Recommend conservative medical therapy at this point Agree with evaluation by nephrology secondary to acute renal failure Hold diuretic therapy because of severe dehydration Recommend rate control with amiodarone and aspirin Consider long-term anticoagulation prior to discharge  Nekesha Font D Angeni Chaudhuri 03/28/2018, 1:44 AM

## 2018-03-29 LAB — RENAL FUNCTION PANEL
ALBUMIN: 3.6 g/dL (ref 3.5–5.0)
Anion gap: 7 (ref 5–15)
BUN: 49 mg/dL — AB (ref 6–20)
CALCIUM: 9.1 mg/dL (ref 8.9–10.3)
CO2: 31 mmol/L (ref 22–32)
CREATININE: 1.62 mg/dL — AB (ref 0.61–1.24)
Chloride: 100 mmol/L — ABNORMAL LOW (ref 101–111)
GFR calc Af Amer: 55 mL/min — ABNORMAL LOW (ref >60)
GFR, EST NON AFRICAN AMERICAN: 47 mL/min — AB (ref >60)
Glucose, Bld: 95 mg/dL (ref 65–99)
PHOSPHORUS: 3.5 mg/dL (ref 2.5–4.6)
POTASSIUM: 4.2 mmol/L (ref 3.5–5.1)
SODIUM: 138 mmol/L (ref 135–145)

## 2018-03-29 NOTE — Progress Notes (Signed)
Patient discharge teaching given, including activity, diet, follow-up appoints, and medications. Patient verbalized understanding of all discharge instructions. IV access was d/c'd. Vitals are stable. Skin is intact except as charted in most recent assessments. Pt to be escorted out by NT, to be driven home by family.  Ford Peddie  

## 2018-03-29 NOTE — Care Management Important Message (Signed)
Copy of signed IM left in patient's room.    

## 2018-03-29 NOTE — Care Management (Signed)
No discharge needs identified by members of the care team 

## 2018-03-29 NOTE — Discharge Summary (Signed)
[facility] [place] [facility]   PATIENT NAME: Andrew Sparks    MR#:  [id number]  DATE OF BIRTH:  Apr 05, 1966  DATE OF ADMISSION:  03/26/2018 ADMITTING PHYSICIAN: Shaune PollackQing Chen, MD  DATE OF DISCHARGE: 03/29/2018  PRIMARY CARE PHYSICIAN: Margaretann LovelessKhan, Neelam S, MD    ADMISSION DIAGNOSIS:  Uremia [N19] Atrial fibrillation with RVR (HCC) [I48.91] Acute renal failure, unspecified acute renal failure type (HCC) [N17.9] Non-intractable vomiting with nausea, unspecified vomiting type [R11.2]  DISCHARGE DIAGNOSIS:  Active Problems:   ARF (acute renal failure) (HCC)   SECONDARY DIAGNOSIS:   Past Medical History:  Diagnosis Date  . Anxiety   . CHF (congestive heart failure) ([facility])   . Depression   . Hypersomnia with sleep apnea   . Hypertension   . Morbid obesity ([facility])   . NSTEMI (non-ST elevated myocardial infarction) ([facility]) 2005  . Obesity hypoventilation syndrome (H[facility])   . Shortness of breath dyspnea   . Sleep apnea   . Stroke ([facility]CC)   . V-tach [facility](HCC)     HOSPITAL COURSE:   52 history of hypertension, previous CVA, obesity pickwickian syndrome, CHF, anxiety who presented to the hospital due to vague abdominal pain also noted to be in acute kidney injury.  1.  Acute renal failure- patient presented to the hospital with a creatinine of greater than 5 his baseline creatinine is around 1-1.5.  This was thought to be secondary to ATN and dehydration with concomitant use of diuretics.  Patient was having some persistent nausea vomiting prior to admission along with patient being on diuretics, ACE inhibitors. - Patient had a renal ultrasound done which was negative for hydronephrosis.  Nephrology consult was obtained.  Patient was hydrated with IV fluids, his diuretics and lisinopril were held.  His creatinine has improved and is now back to baseline around 1.6.  He is being discharged home but has been taken off the hydrochlorothiazide for now. She will follow-up with  nephrology next week for at least a metabolic profile checked.  2.  Abdominal pain- she presented to the hospital with abdominal pain nausea and vomiting.  He underwent a CT scan of the abdomen pelvis which was negative point acute intra-abdominal pathology.  He does have a periumbilical hernia and therefore a surgical consult was obtained.  The hernia was reducible and did not think the patient needed surgical intervention.  He was advised to follow-up with them as an outpatient regarding intervention if needed. -Patient presently denies any abdominal pain, no further nausea vomiting and had a BM a day prior to discharge.   3.  Chronic respiratory failure-secondary to obesity pickwickian syndrome.  Patient is status post tracheostomy. -Continue trach care per protocol.   Needs to get his tracheostomy changed and we will make him an appointment with ENT prior to discharge.   4.  History of alcohol abuse- in the hospital patient was on CIWA protocol but did not require many medications for alcohol withdrawal.  He is currently clinically stable.  5.  Anxiety/depression- continue his Xanax, Celexa  6.  Essential hypertension-continue clonidine, lisinopril, metoprolol, Hydralazine.  7.  Tobacco abuse- while in the hospital patient was maintained on a nicotine patch..    DISCHARGE CONDITIONS:   stable  CONSULTS OBTAINED:  Treatment Team:  Lamont DowdyKolluru, Sarath, MD Alwyn Peaallwood, Dwayne D, MD  DRUG ALLERGIES:  No Known Allergies  DISCHARGE MEDICATIONS:   Allergies as of 03/29/2018   No Known Allergies     Medication List    STOP  taking these medications   azithromycin 250 MG tablet Commonly known as:  ZITHROMAX   hydrochlorothiazide 25 MG tablet Commonly known as:  HYDRODIURIL   levofloxacin 500 MG tablet Commonly known as:  LEVAQUIN   predniSONE 10 MG tablet Commonly known as:  DELTASONE     TAKE these medications   albuterol 108 (90 Base) MCG/ACT inhaler Commonly known as:   PROVENTIL HFA;VENTOLIN HFA Inhale 2 puffs into the lungs every 6 (six) hours as needed for wheezing or shortness of breath.   ALPRAZolam 0.25 MG tablet Commonly known as:  XANAX Take 1 tablet (0.25 mg total) by mouth daily as needed for anxiety (panic attack).   amiodarone 200 MG tablet Commonly known as:  PACERONE Take 1 tablet (200 mg total) by mouth daily.   aspirin EC 81 MG tablet Take 81 mg by mouth daily.   citalopram 20 MG tablet Commonly known as:  CELEXA Take 1 tablet by mouth daily.   cloNIDine 0.1 MG tablet Commonly known as:  CATAPRES Take 1 tablet (0.1 mg total) by mouth 3 (three) times daily. What changed:    how much to take  Another medication with the same name was removed. Continue taking this medication, and follow the directions you see here.   FLUoxetine 20 MG capsule Commonly known as:  PROZAC Take 1 capsule (20 mg total) by mouth daily.   furosemide 40 MG tablet Commonly known as:  LASIX Take 1 tablet (40 mg total) by mouth daily.   hydrALAZINE 100 MG tablet Commonly known as:  APRESOLINE Take 1 tablet (100 mg total) by mouth 3 (three) times daily.   lisinopril 10 MG tablet Commonly known as:  PRINIVIL,ZESTRIL Take 1 tablet (10 mg total) by mouth daily. What changed:    how much to take  Another medication with the same name was removed. Continue taking this medication, and follow the directions you see here.   metoprolol tartrate 25 MG tablet Commonly known as:  LOPRESSOR Take 1 tablet (25 mg total) by mouth 2 (two) times daily.   nitroGLYCERIN 0.4 MG SL tablet Commonly known as:  NITROSTAT Place 1 tablet under the tongue every 5 (five) minutes as needed for chest pain.         DISCHARGE INSTRUCTIONS:   DIET:  Cardiac diet  DISCHARGE CONDITION:  Stable  ACTIVITY:  Activity as tolerated  OXYGEN:  Home Oxygen: No.   Oxygen Delivery: room air  DISCHARGE LOCATION:  home   If you experience worsening of your admission  symptoms, develop shortness of breath, life threatening emergency, suicidal or homicidal thoughts you must seek medical attention immediately by calling 911 or calling your MD immediately  if symptoms less severe.  You Must read complete instructions/literature along with all the possible adverse reactions/side effects for all the Medicines you take and that have been prescribed to you. Take any new Medicines after you have completely understood and accpet all the possible adverse reactions/side effects.   Please note  You were cared for by a hospitalist during your hospital stay. If you have any questions about your discharge medications or the care you received while you were in the hospital after you are discharged, you can call the unit and asked to speak with the hospitalist on call if the hospitalist that took care of you is not available. Once you are discharged, your primary care physician will handle any further medical issues. Please note that NO REFILLS for any discharge medications will be authorized once  you are discharged, as it is imperative that you return to your primary care physician (or establish a relationship with a primary care physician if you do not have one) for your aftercare needs so that they can reassess your need for medications and monitor your lab values.     Today   She still complaining of some vague abdominal pain but no nausea or vomiting.  Tolerating his diet well.  Creatinine is close to baseline patient is urinating well.  Will discharge home today with follow-up with nephrology and general surgery and ENT as an outpatient.  VITAL SIGNS:  Blood pressure (!) 162/99, pulse 67, temperature 98.3 F (36.8 C), temperature source Oral, resp. rate 20, height 5\' 6"  (1.676 m), weight 117.9 kg (260 lb), SpO2 100 %.  I/O:    Intake/Output Summary (Last 24 hours) at 03/29/2018 1348 Last data filed at 03/29/2018 1030 Gross per 24 hour  Intake 3438 ml  Output 900 ml   Net 2538 ml    PHYSICAL EXAMINATION:   GENERAL:  52 y.o.-year-old obese patient lying in bed in no acute distress.  EYES: Pupils equal, round, reactive to light and accommodation. No scleral icterus. Extraocular muscles intact.  HEENT: Head atraumatic, normocephalic. Oropharynx and nasopharynx clear. + Trach in place NECK:  Supple, no jugular venous distention. No thyroid enlargement, no tenderness.  LUNGS: Normal breath sounds bilaterally, no wheezing, rales, rhonchi. No use of accessory muscles of respiration.  CARDIOVASCULAR: S1, S2 normal. No murmurs, rubs, or gallops.  ABDOMEN: Soft, nontender, nondistended. Bowel sounds present. No organomegaly or mass.  peri-umbilical hernia but no incarcerated.  EXTREMITIES: No cyanosis, clubbing or edema b/l.    NEUROLOGIC: Cranial nerves II through XII are intact. No focal Motor or sensory deficits b/l. Globally weak.    PSYCHIATRIC: The patient is alert and oriented x 3.  SKIN: No obvious rash, lesion, or ulcer.  DATA REVIEW:   CBC Recent Labs  Lab 03/27/18 0453  WBC 3.8  HGB 14.0  HCT 40.8  PLT 252    Chemistries  Recent Labs  Lab 03/26/18 1548  03/29/18 0600  NA 132*   < > 138  K 3.9   < > 4.2  CL 86*   < > 100*  CO2 30   < > 31  GLUCOSE 111*   < > 95  BUN 97*   < > 49*  CREATININE 5.72*   < > 1.62*  CALCIUM 10.0   < > 9.1  AST 49*  --   --   ALT 31  --   --   ALKPHOS 85  --   --   BILITOT 1.3*  --   --    < > = values in this interval not displayed.    Cardiac Enzymes Recent Labs  Lab 03/26/18 1548  TROPONINI 0.05*    Microbiology Results  No results found for this or any previous visit.  RADIOLOGY:  No results found.    Management plans discussed with the patient, family and they are in agreement.  CODE STATUS:     Code Status Orders  (From admission, onward)        Start     Ordered   03/26/18 1849  Full code  Continuous     03/26/18 1849     TOTAL TIME TAKING CARE OF THIS PATIENT: 40  minutes.    Houston Siren M.D on 03/29/2018 at 1:48 PM  Between 7am to 6pm - Pager - [phone]  After 6pm go to [email or web address] - Social research officer, government  [facility] [place] [facility]  Office  [phone]  CC: Primary care physician; [name], MD

## 2018-03-29 NOTE — Progress Notes (Signed)
[facility]  ROUNDING NOTE   Subjective:   UOP 1175  Creatinine 1.65 (2.56)  Objective:  Vital signs in last 24 hours:  Temp:  [98.1 F (36.7 C)-98.5 F (36.9 C)] 98.3 F (36.8 C) (04/26 1203) Pulse Rate:  [58-96] 67 (04/26 1203) Resp:  [20] 20 (04/26 1203) BP: (124-165)/(95-118) 162/99 (04/26 1203) SpO2:  [98 %-100 %] 100 % (04/26 1203)  Weight change:  Filed Weights   03/26/18 1539  Weight: 117.9 kg (260 lb)    Intake/Output: I/O last 3 completed shifts: In: 4156 [P.O.:1000; I.V.:3156] Out: 1575 [Urine:1575]   Intake/Output this shift:  Total I/O In: 706 [P.O.:120; I.V.:586] Out: 300 [Urine:300]  Physical Exam: General: NAD, laying in bed  Head: Normocephalic, atraumatic. Moist oral mucosal membranes  Eyes: Anicteric, PERRL  Neck: + tracheostomy PMV  Lungs:  Clear to auscultation  Heart: Regular rate and rhythm  Abdomen:  Soft, nontender,   Extremities:  no peripheral edema.  Neurologic: Nonfocal, moving all four extremities  Skin: No lesions        Basic Metabolic Panel: Recent Labs  Lab 03/26/18 1548 03/26/18 1947 03/27/18 0453 03/28/18 0702 03/29/18 0600  NA 132*  --  134* 135 138  K 3.9  --  3.4* 3.8 4.2  CL 86*  --  93* 97* 100*  CO2 30  --  31 29 31   GLUCOSE 111*  --  122* 100* 95  BUN 97*  --  93* 73* 49*  CREATININE 5.72* 5.67* 5.08* 2.56* 1.62*  CALCIUM 10.0  --  9.3 9.3 9.1  PHOS  --   --   --  3.8 3.5    Liver Function Tests: Recent Labs  Lab 03/26/18 1548 03/28/18 0702 03/29/18 0600  AST 49*  --   --   ALT 31  --   --   ALKPHOS 85  --   --   BILITOT 1.3*  --   --   PROT 8.4*  --   --   ALBUMIN 4.3 3.7 3.6   Recent Labs  Lab 03/26/18 1548  LIPASE 65*   No results for input(s): AMMONIA in the last 168 hours.  CBC: Recent Labs  Lab 03/26/18 1548 03/27/18 0453  WBC 4.5 3.8  HGB 15.9 14.0  HCT 46.0 40.8  MCV 96.0 97.2  PLT 314 252    Cardiac Enzymes: Recent Labs  Lab 03/26/18 1548   TROPONINI 0.05*    BNP: Invalid input(s): POCBNP  CBG: No results for input(s): GLUCAP in the last 168 hours.  Microbiology: No results found for this or any previous visit.  Coagulation Studies: No results for input(s): LABPROT, INR in the last 72 hours.  Urinalysis: Recent Labs    03/27/18 0615  COLORURINE YELLOW*  LABSPEC 1.011  PHURINE 5.0  GLUCOSEU NEGATIVE  HGBUR NEGATIVE  BILIRUBINUR NEGATIVE  KETONESUR NEGATIVE  PROTEINUR NEGATIVE  NITRITE NEGATIVE  LEUKOCYTESUR NEGATIVE      Imaging: No results found.   Medications:    . amiodarone  200 mg Oral Daily  . aspirin EC  81 mg Oral Daily  . citalopram  20 mg Oral Daily  . cloNIDine  0.1 mg Oral TID  . FLUoxetine  20 mg Oral Daily  . folic acid  1 mg Oral Daily  . heparin  5,000 Units Subcutaneous Q8H  . hydrALAZINE  100 mg Oral TID  . multivitamin with minerals  1 tablet Oral Daily  . nicotine  14 mg Transdermal Daily  .  thiamine  100 mg Oral Daily   Or  . thiamine  100 mg Intravenous Daily   acetaminophen **OR** acetaminophen, albuterol, ALPRAZolam, bisacodyl, HYDROcodone-acetaminophen, LORazepam **OR** LORazepam, ondansetron **OR** ondansetron (ZOFRAN) IV, senna-docusate  Assessment/ Plan:  [name] is a 52 y.o. black male [name] is a 52 y.o. black male with hypertension, atrial fibrillation, obstructive sleep apnea status post tracheostomy, congestive heart failure, coronary artery disease, cerebrovascular accident, depression, tobacco use, who was admitted to [facility] on 03/26/2018 for acute renal failure  1. Acute renal failure: with baseline creatinine of 1.02, GFR within normal limits. 12/17/17.  Secondary to prerenal azotemia from nausea and poor appetite.  - Creatinine and electrolytes improving.  - Encourage symptom management and PO intake.   2. Hypertension: blood pressure at goal today. Currently off his home blood pressure regimen.  - Restart  lisinopril and furosemide. Do not recommend restarting hydrochlorothiazide at this time.   3. Hyponatremia and hypokalemia: improved with replacement  Will need outpatient follow up with nephrology.    LOS: 3 Quintel Mccalla 4/26/201912:47 PM

## 2018-04-04 ENCOUNTER — Telehealth: Payer: Self-pay | Admitting: Licensed Clinical Social Worker

## 2018-04-04 NOTE — Telephone Encounter (Signed)
CSW contacted patient due to a flag on the EMMI response that patient gave regarding lost interest in things. No answer. CSW left message for patient. CSW will attempt one more time. York Spaniel MSW,LCSW [phone]

## 2018-04-11 ENCOUNTER — Ambulatory Visit: Payer: Medicare Other | Admitting: Surgery

## 2018-05-03 ENCOUNTER — Ambulatory Visit: Payer: Medicare Other | Admitting: Surgery

## 2018-05-10 ENCOUNTER — Other Ambulatory Visit: Payer: Self-pay

## 2018-05-15 ENCOUNTER — Ambulatory Visit: Payer: Medicare Other | Admitting: Surgery

## 2018-06-03 ENCOUNTER — Ambulatory Visit: Payer: Medicare Other | Admitting: Surgery

## 2018-06-17 ENCOUNTER — Telehealth: Payer: Self-pay | Admitting: General Practice

## 2018-06-17 NOTE — Telephone Encounter (Signed)
Left a message on patients primary care's office number to call our office due to the patient no showing four times in our office.

## 2018-09-09 ENCOUNTER — Emergency Department: Payer: Medicare Other

## 2018-09-09 ENCOUNTER — Other Ambulatory Visit: Payer: Self-pay

## 2018-09-09 ENCOUNTER — Observation Stay
Admission: EM | Admit: 2018-09-09 | Discharge: 2018-09-11 | Disposition: A | Discharge status: Home / Self Care | Payer: Medicare Other | Attending: Internal Medicine | Admitting: Internal Medicine

## 2018-09-09 ENCOUNTER — Encounter: Payer: Self-pay | Admitting: Emergency Medicine

## 2018-09-09 DIAGNOSIS — I110 Hypertensive heart disease with heart failure: Secondary | ICD-10-CM | POA: Diagnosis not present

## 2018-09-09 DIAGNOSIS — Z79899 Other long term (current) drug therapy: Secondary | ICD-10-CM | POA: Diagnosis not present

## 2018-09-09 DIAGNOSIS — J449 Chronic obstructive pulmonary disease, unspecified: Secondary | ICD-10-CM | POA: Insufficient documentation

## 2018-09-09 DIAGNOSIS — Z8249 Family history of ischemic heart disease and other diseases of the circulatory system: Secondary | ICD-10-CM | POA: Diagnosis not present

## 2018-09-09 DIAGNOSIS — R0789 Other chest pain: Secondary | ICD-10-CM | POA: Diagnosis not present

## 2018-09-09 DIAGNOSIS — Z8673 Personal history of transient ischemic attack (TIA), and cerebral infarction without residual deficits: Secondary | ICD-10-CM | POA: Diagnosis not present

## 2018-09-09 DIAGNOSIS — R55 Syncope and collapse: Secondary | ICD-10-CM | POA: Diagnosis not present

## 2018-09-09 DIAGNOSIS — I5030 Unspecified diastolic (congestive) heart failure: Secondary | ICD-10-CM | POA: Diagnosis not present

## 2018-09-09 DIAGNOSIS — F329 Major depressive disorder, single episode, unspecified: Secondary | ICD-10-CM | POA: Insufficient documentation

## 2018-09-09 DIAGNOSIS — G4733 Obstructive sleep apnea (adult) (pediatric): Secondary | ICD-10-CM | POA: Diagnosis not present

## 2018-09-09 DIAGNOSIS — Z9889 Other specified postprocedural states: Secondary | ICD-10-CM | POA: Insufficient documentation

## 2018-09-09 DIAGNOSIS — I472 Ventricular tachycardia: Secondary | ICD-10-CM | POA: Insufficient documentation

## 2018-09-09 DIAGNOSIS — I252 Old myocardial infarction: Secondary | ICD-10-CM | POA: Diagnosis not present

## 2018-09-09 DIAGNOSIS — F17210 Nicotine dependence, cigarettes, uncomplicated: Secondary | ICD-10-CM | POA: Insufficient documentation

## 2018-09-09 DIAGNOSIS — Z7982 Long term (current) use of aspirin: Secondary | ICD-10-CM | POA: Diagnosis not present

## 2018-09-09 DIAGNOSIS — R079 Chest pain, unspecified: Secondary | ICD-10-CM

## 2018-09-09 DIAGNOSIS — F419 Anxiety disorder, unspecified: Secondary | ICD-10-CM | POA: Diagnosis not present

## 2018-09-09 DIAGNOSIS — I420 Dilated cardiomyopathy: Secondary | ICD-10-CM | POA: Insufficient documentation

## 2018-09-09 DIAGNOSIS — Z6834 Body mass index (BMI) 34.0-34.9, adult: Secondary | ICD-10-CM | POA: Diagnosis not present

## 2018-09-09 DIAGNOSIS — E876 Hypokalemia: Secondary | ICD-10-CM | POA: Diagnosis not present

## 2018-09-09 DIAGNOSIS — I10 Essential (primary) hypertension: Secondary | ICD-10-CM

## 2018-09-09 LAB — CBC
HEMATOCRIT: 41.1 % (ref 40.0–52.0)
HEMOGLOBIN: 14.4 g/dL (ref 13.0–18.0)
MCH: 33.5 pg (ref 26.0–34.0)
MCHC: 35 g/dL (ref 32.0–36.0)
MCV: 95.9 fL (ref 80.0–100.0)
Platelets: 245 10*3/uL (ref 150–440)
RBC: 4.29 MIL/uL — ABNORMAL LOW (ref 4.40–5.90)
RDW: 14.8 % — ABNORMAL HIGH (ref 11.5–14.5)
WBC: 7.1 10*3/uL (ref 3.8–10.6)

## 2018-09-09 LAB — BASIC METABOLIC PANEL
ANION GAP: 13 (ref 5–15)
BUN: 12 mg/dL (ref 6–20)
CO2: 26 mmol/L (ref 22–32)
Calcium: 9 mg/dL (ref 8.9–10.3)
Chloride: 98 mmol/L (ref 98–111)
Creatinine, Ser: 1.04 mg/dL (ref 0.61–1.24)
GFR calc non Af Amer: >60 mL/min (ref >60)
Glucose, Bld: 117 mg/dL — ABNORMAL HIGH (ref 70–99)
POTASSIUM: 3 mmol/L — AB (ref 3.5–5.1)
SODIUM: 137 mmol/L (ref 135–145)

## 2018-09-09 LAB — TROPONIN I
TROPONIN I: 0.04 ng/mL — AB (ref <0.03)
Troponin I: 0.05 ng/mL (ref <0.03)

## 2018-09-09 MED ORDER — ASPIRIN EC 81 MG PO TBEC: 81.0000 mg | DELAYED_RELEASE_TABLET | Freq: Every day | ORAL | Status: DC

## 2018-09-09 MED ORDER — NITROGLYCERIN 0.4 MG SL SUBL: 0.4000 mg | SUBLINGUAL_TABLET | SUBLINGUAL | Status: DC | PRN

## 2018-09-09 MED ORDER — LISINOPRIL 10 MG PO TABS
20.0000 mg | ORAL_TABLET | Freq: Once | ORAL | Status: AC
  Administered 2018-09-09: 20 mg via ORAL
  Filled 2018-09-09: qty 2

## 2018-09-09 MED ORDER — LORAZEPAM 2 MG/ML IJ SOLN
1.0000 mg | Freq: Once | INTRAMUSCULAR | Status: AC
  Administered 2018-09-09: 1 mg via INTRAVENOUS
  Filled 2018-09-09: qty 1

## 2018-09-09 MED ORDER — CITALOPRAM HYDROBROMIDE 20 MG PO TABS: 20.0000 mg | ORAL_TABLET | Freq: Every day | ORAL | Status: DC

## 2018-09-09 MED ORDER — HYDRALAZINE HCL 50 MG PO TABS
100.0000 mg | ORAL_TABLET | Freq: Once | ORAL | Status: AC
  Administered 2018-09-09: 100 mg via ORAL
  Filled 2018-09-09: qty 2

## 2018-09-09 MED ORDER — NICOTINE 21 MG/24HR TD PT24
21.0000 mg | MEDICATED_PATCH | Freq: Every day | TRANSDERMAL | Status: DC
  Administered 2018-09-10 – 2018-09-11 (×2): 21 mg via TRANSDERMAL
  Filled 2018-09-09 (×2): qty 1

## 2018-09-09 MED ORDER — SODIUM CHLORIDE 0.9% FLUSH: 3.0000 mL | INTRAVENOUS | Status: DC | PRN

## 2018-09-09 MED ORDER — PANTOPRAZOLE SODIUM 40 MG PO TBEC
40.0000 mg | DELAYED_RELEASE_TABLET | Freq: Every day | ORAL | Status: DC
  Administered 2018-09-10 – 2018-09-11 (×2): 40 mg via ORAL
  Filled 2018-09-09 (×2): qty 1

## 2018-09-09 MED ORDER — HYDRALAZINE HCL 50 MG PO TABS
100.0000 mg | ORAL_TABLET | Freq: Three times a day (TID) | ORAL | Status: DC
  Administered 2018-09-09 – 2018-09-11 (×5): 100 mg via ORAL
  Filled 2018-09-09 (×5): qty 2

## 2018-09-09 MED ORDER — CLONIDINE HCL 0.1 MG PO TABS
0.3000 mg | ORAL_TABLET | Freq: Once | ORAL | Status: AC
  Administered 2018-09-09: 0.3 mg via ORAL
  Filled 2018-09-09: qty 3

## 2018-09-09 MED ORDER — ACETAMINOPHEN 325 MG PO TABS
650.0000 mg | ORAL_TABLET | ORAL | Status: DC | PRN
  Administered 2018-09-10: 650 mg via ORAL
  Filled 2018-09-09: qty 2

## 2018-09-09 MED ORDER — FLUOXETINE HCL 20 MG PO CAPS
20.0000 mg | ORAL_CAPSULE | Freq: Every day | ORAL | Status: DC
  Administered 2018-09-10 – 2018-09-11 (×2): 20 mg via ORAL
  Filled 2018-09-09 (×2): qty 1

## 2018-09-09 MED ORDER — SODIUM CHLORIDE 0.9% FLUSH
3.0000 mL | Freq: Two times a day (BID) | INTRAVENOUS | Status: DC
  Administered 2018-09-09 – 2018-09-11 (×4): 3 mL via INTRAVENOUS

## 2018-09-09 MED ORDER — METOPROLOL TARTRATE 25 MG PO TABS
25.0000 mg | ORAL_TABLET | Freq: Two times a day (BID) | ORAL | Status: DC
  Administered 2018-09-09 – 2018-09-10 (×3): 25 mg via ORAL
  Filled 2018-09-09 (×4): qty 1

## 2018-09-09 MED ORDER — ASPIRIN 81 MG PO CHEW: 324.0000 mg | CHEWABLE_TABLET | ORAL | Status: DC

## 2018-09-09 MED ORDER — FUROSEMIDE 40 MG PO TABS
40.0000 mg | ORAL_TABLET | Freq: Every day | ORAL | Status: DC
  Administered 2018-09-10 – 2018-09-11 (×2): 40 mg via ORAL
  Filled 2018-09-09 (×2): qty 1

## 2018-09-09 MED ORDER — ALPRAZOLAM 0.5 MG PO TABS
0.2500 mg | ORAL_TABLET | Freq: Every day | ORAL | Status: DC | PRN
  Administered 2018-09-10: 0.25 mg via ORAL
  Filled 2018-09-09: qty 1

## 2018-09-09 MED ORDER — SODIUM CHLORIDE 0.9 % IV SOLN: 250.0000 mL | INTRAVENOUS | Status: DC | PRN

## 2018-09-09 MED ORDER — POTASSIUM CHLORIDE CRYS ER 20 MEQ PO TBCR
20.0000 meq | EXTENDED_RELEASE_TABLET | Freq: Every day | ORAL | Status: DC
  Administered 2018-09-10 – 2018-09-11 (×2): 20 meq via ORAL
  Filled 2018-09-09 (×2): qty 1

## 2018-09-09 MED ORDER — ASPIRIN 81 MG PO CHEW
324.0000 mg | CHEWABLE_TABLET | Freq: Once | ORAL | Status: AC
  Administered 2018-09-09: 324 mg via ORAL
  Filled 2018-09-09: qty 4

## 2018-09-09 MED ORDER — CLONIDINE HCL 0.1 MG PO TABS
0.1000 mg | ORAL_TABLET | Freq: Three times a day (TID) | ORAL | Status: DC
  Administered 2018-09-09 – 2018-09-11 (×5): 0.1 mg via ORAL
  Filled 2018-09-09 (×5): qty 1

## 2018-09-09 MED ORDER — HYDRALAZINE HCL 20 MG/ML IJ SOLN: 10.0000 mg | INTRAMUSCULAR | Status: DC | PRN

## 2018-09-09 MED ORDER — ASPIRIN EC 81 MG PO TBEC
81.0000 mg | DELAYED_RELEASE_TABLET | Freq: Every day | ORAL | Status: DC
  Administered 2018-09-10 – 2018-09-11 (×2): 81 mg via ORAL
  Filled 2018-09-09 (×2): qty 1

## 2018-09-09 MED ORDER — AMIODARONE HCL 200 MG PO TABS
200.0000 mg | ORAL_TABLET | Freq: Every day | ORAL | Status: DC
  Administered 2018-09-10 – 2018-09-11 (×2): 200 mg via ORAL
  Filled 2018-09-09 (×2): qty 1

## 2018-09-09 MED ORDER — SODIUM CHLORIDE 0.9 % IV SOLN: INTRAVENOUS | Status: DC

## 2018-09-09 MED ORDER — ALBUTEROL SULFATE (2.5 MG/3ML) 0.083% IN NEBU: 2.5000 mg | INHALATION_SOLUTION | Freq: Four times a day (QID) | RESPIRATORY_TRACT | Status: DC | PRN

## 2018-09-09 MED ORDER — LISINOPRIL 20 MG PO TABS
20.0000 mg | ORAL_TABLET | Freq: Every day | ORAL | Status: DC
  Administered 2018-09-10 – 2018-09-11 (×2): 20 mg via ORAL
  Filled 2018-09-09 (×2): qty 1

## 2018-09-09 MED ORDER — POTASSIUM CHLORIDE CRYS ER 20 MEQ PO TBCR
40.0000 meq | EXTENDED_RELEASE_TABLET | Freq: Once | ORAL | Status: AC
  Administered 2018-09-09: 40 meq via ORAL
  Filled 2018-09-09: qty 2

## 2018-09-09 MED ORDER — ENOXAPARIN SODIUM 40 MG/0.4ML ~~LOC~~ SOLN
40.0000 mg | SUBCUTANEOUS | Status: DC
  Administered 2018-09-09 – 2018-09-10 (×2): 40 mg via SUBCUTANEOUS
  Filled 2018-09-09 (×2): qty 0.4

## 2018-09-09 MED ORDER — ONDANSETRON HCL 4 MG/2ML IJ SOLN: 4.0000 mg | Freq: Four times a day (QID) | INTRAMUSCULAR | Status: DC | PRN

## 2018-09-09 MED ORDER — ASPIRIN 300 MG RE SUPP: 300.0000 mg | RECTAL | Status: DC

## 2018-09-09 NOTE — ED Notes (Signed)
Date and time results received: 09/09/18 5:15 PM  Test: Troponin Critical Value: 0.05 ng/mL  Name of Provider Notified: Dr. Shaune Pollack

## 2018-09-09 NOTE — H&P (Addendum)
[facility] [place] at [facility]   PATIENT NAME: Andrew Sparks    MR#:  [id number]  DATE OF BIRTH:  [birth year]  DATE OF ADMISSION:  09/09/2018  PRIMARY CARE PHYSICIAN: [name], MD   REQUESTING/REFERRING PHYSICIAN:   CHIEF COMPLAINT:   Chief Complaint  Patient presents with  . Chest Pain    HISTORY OF PRESENT ILLNESS: Andrew Sparks  is a 52 y.o. male with a known history of congestive heart failure, hypertension, myocardial infarction in the past, sleep apnea, stroke, ventricular tachycardia presented to the emergency room for chest pain.  Patient got up from the stool felt dizzy was about to pass out at home.  He presented to the emergency room with above complaints.  The pain in the chest is located in the left side of the chest sharp in nature 4 out of 10 on a scale of 1-10.  He was evaluated in the emergency room first set of troponin 0.05.  Hospitalist service was consulted for further care.   PAST MEDICAL HISTORY:   Past Medical History:  Diagnosis Date  . Anxiety   . CHF (congestive heart failure) (HCC)   . Depression   . Hypersomnia with sleep apnea   . Hypertension   . Morbid obesity (HCC)   . NSTEMI (non-ST elevated myocardial infarction) (HCC) 2005  . Obesity hypoventilation syndrome (HCC)   . Shortness of breath dyspnea   . Sleep apnea   . Stroke (HCC)   . V-tach [facility])     PAST SURGICAL HISTORY:  Past Surgical History:  Procedure Laterality Date  . TRACHEOSTOMY      SOCIAL HISTORY:  Social History   Tobacco Use  . Smoking status: Current Every Day Smoker    Packs/day: 0.50    Years: 28.00    Pack years: 14.00    Types: Cigarettes  . Smokeless tobacco: Never Used  Substance Use Topics  . Alcohol use: Yes    Alcohol/week: 8.0 standard drinks    Types: 8 Cans of beer per week    FAMILY HISTORY:  Family History  Problem Relation Age of Onset  . Heart disease Father   . Diabetes type II Mother   . Breast cancer  Mother     DRUG ALLERGIES: No Known Allergies  REVIEW OF SYSTEMS:   CONSTITUTIONAL: No fever,has  fatigue and weakness.  EYES: No blurred or double vision.  EARS, NOSE, AND THROAT: No tinnitus or ear pain.  RESPIRATORY: No cough, shortness of breath, wheezing or hemoptysis.  CARDIOVASCULAR: Has chest pain, no orthopnea, edema.  GASTROINTESTINAL: No nausea, vomiting, diarrhea or abdominal pain.  GENITOURINARY: No dysuria, hematuria.  ENDOCRINE: No polyuria, nocturia,  HEMATOLOGY: No anemia, easy bruising or bleeding SKIN: No rash or lesion. MUSCULOSKELETAL: No joint pain or arthritis.   NEUROLOGIC: No tingling, numbness, weakness.  Has dizziness PSYCHIATRY: No anxiety or depression.   MEDICATIONS AT HOME:  Prior to Admission medications   Medication Sig Start Date End Date Taking? Authorizing Provider  acetaminophen (TYLENOL) 325 MG tablet Take by mouth.    [provider]  albuterol (PROVENTIL HFA;VENTOLIN HFA) 108 (90 Base) MCG/ACT inhaler Inhale 2 puffs into the lungs every 6 (six) hours as needed for wheezing or shortness of breath. 12/17/17   Governor Rooks, MD  ALPRAZolam Prudy Feeler) 0.25 MG tablet Take 1 tablet (0.25 mg total) by mouth daily as needed for anxiety (panic attack). 01/18/17   Clapacs, Jackquline Denmark, MD  amiodarone (PACERONE) 200 MG tablet  Take 1 tablet (200 mg total) by mouth daily. 07/19/17   Houston Siren, MD  aspirin EC 81 MG tablet Take 81 mg by mouth daily.    [provider]  citalopram (CELEXA) 20 MG tablet Take 1 tablet by mouth daily. 03/05/18   [provider]  cloNIDine (CATAPRES) 0.1 MG tablet Take 1 tablet (0.1 mg total) by mouth 3 (three) times daily. Patient taking differently: Take 0.3 mg by mouth 3 (three) times daily.  07/19/17   Houston Siren, MD  famotidine (PEPCID) 20 MG tablet Take by mouth. 12/23/14   [provider]  FLUoxetine (PROZAC) 20 MG capsule Take 1 capsule (20 mg total) by mouth daily. 07/19/17   Houston Siren, MD  furosemide (LASIX) 40 MG tablet Take 1 tablet (40 mg total) by mouth daily. 07/19/17   Houston Siren, MD  hydrALAZINE (APRESOLINE) 100 MG tablet Take 1 tablet (100 mg total) by mouth 3 (three) times daily. 12/23/14   Love, Evlyn Kanner, PA-C  hydrochlorothiazide (HYDRODIURIL) 25 MG tablet Take by mouth. 08/30/15   [provider]  lisinopril (PRINIVIL,ZESTRIL) 10 MG tablet Take 1 tablet (10 mg total) by mouth daily. Patient taking differently: Take 20 mg by mouth daily.  07/19/17   Houston Siren, MD  metoprolol tartrate (LOPRESSOR) 25 MG tablet Take 1 tablet (25 mg total) by mouth 2 (two) times daily. 07/19/17   Houston Siren, MD  nitroGLYCERIN (NITROSTAT) 0.4 MG SL tablet Place 1 tablet under the tongue every 5 (five) minutes as needed for chest pain.  02/13/18   [provider]  omeprazole (PRILOSEC) 40 MG capsule Take by mouth. 08/30/15   [provider]  potassium chloride SA (K-DUR,KLOR-CON) 20 MEQ tablet Take by mouth. 12/23/14   [provider]      PHYSICAL EXAMINATION:   VITAL SIGNS: Blood pressure (!) 149/101, pulse 62, temperature 97.7 F (36.5 C), temperature source Oral, resp. rate 16, height 5\' 11"  (1.803 m), weight 117.9 kg, SpO2 98 %.  GENERAL:  52 y.o.-year-old patient lying in the bed with no acute distress.  EYES: Pupils equal, round, reactive to light and accommodation. No scleral icterus. Extraocular muscles intact.  HEENT: Head atraumatic, normocephalic. Oropharynx and nasopharynx clear.  NECK:  Supple, no jugular venous distention. No thyroid enlargement, no tenderness.  LUNGS: Normal breath sounds bilaterally, no wheezing, rales,rhonchi or crepitation. No use of accessory muscles of respiration.  CARDIOVASCULAR: S1, S2 normal. No murmurs, rubs, or gallops.  ABDOMEN: Soft, nontender, nondistended. Bowel sounds present. No organomegaly or mass.  EXTREMITIES: No pedal edema, cyanosis, or clubbing.  NEUROLOGIC: Cranial nerves  II through XII are intact. Muscle strength 5/5 in all extremities. Sensation intact. Gait not checked.  PSYCHIATRIC: The patient is alert and oriented x 3.  SKIN: No obvious rash, lesion, or ulcer.   LABORATORY PANEL:   CBC Recent Labs  Lab 09/09/18 1632  WBC 7.1  HGB 14.4  HCT 41.1  PLT 245  MCV 95.9  MCH 33.5  MCHC 35.0  RDW 14.8*   ------------------------------------------------------------------------------------------------------------------  Chemistries  Recent Labs  Lab 09/09/18 1632  NA 137  K 3.0*  CL 98  CO2 26  GLUCOSE 117*  BUN 12  CREATININE 1.04  CALCIUM 9.0   ------------------------------------------------------------------------------------------------------------------ estimated creatinine clearance is 108.5 mL/min (by C-G formula based on SCr of 1.04 mg/dL). ------------------------------------------------------------------------------------------------------------------ No results for input(s): TSH, T4TOTAL, T3FREE, THYROIDAB in the last 72 hours.  Invalid input(s): FREET3   Coagulation profile  No results for input(s): INR, PROTIME in the last 168 hours. ------------------------------------------------------------------------------------------------------------------- No results for input(s): DDIMER in the last 72 hours. -------------------------------------------------------------------------------------------------------------------  Cardiac Enzymes Recent Labs  Lab 09/09/18 1632  TROPONINI 0.05*   ------------------------------------------------------------------------------------------------------------------ Invalid input(s): POCBNP  ---------------------------------------------------------------------------------------------------------------  Urinalysis    Component Value Date/Time   COLORURINE YELLOW (A) 03/27/2018 0615   APPEARANCEUR CLEAR (A) 03/27/2018 0615   APPEARANCEUR Clear 12/11/2014 1147   LABSPEC 1.011 03/27/2018  0615   LABSPEC 1.020 12/11/2014 1147   PHURINE 5.0 03/27/2018 0615   GLUCOSEU NEGATIVE 03/27/2018 0615   GLUCOSEU Negative 12/11/2014 1147   HGBUR NEGATIVE 03/27/2018 0615   BILIRUBINUR NEGATIVE 03/27/2018 0615   BILIRUBINUR Negative 12/11/2014 1147   KETONESUR NEGATIVE 03/27/2018 0615   PROTEINUR NEGATIVE 03/27/2018 0615   NITRITE NEGATIVE 03/27/2018 0615   LEUKOCYTESUR NEGATIVE 03/27/2018 0615   LEUKOCYTESUR Negative 12/11/2014 1147     RADIOLOGY: Dg Chest 2 View  Result Date: 09/09/2018 CLINICAL DATA:  Sudden onset chest pain at 1530 hours today. The pain was on the left side and lasted approximately 45 minutes. Smoker. EXAM: CHEST - 2 VIEW COMPARISON:  03/26/2018. FINDINGS: The tracheostomy tube has been removed. Stable enlarged cardiac silhouette and mildly tortuous thoracic aorta. Clear lungs. The pulmonary vasculature is mildly prominent without significant change. The lungs remain hyperexpanded with mild diffuse peribronchial thickening and accentuation of the interstitial markings, without significant change. Unremarkable bones. IMPRESSION: 1. No acute abnormality. 2. Stable cardiomegaly and mild pulmonary vascular congestion. 3. Stable changes of COPD and chronic bronchitis. Electronically Signed   By: Beckie Salts M.D.   On: 09/09/2018 17:01    EKG: Orders placed or performed during the hospital encounter of 09/09/18  . EKG 12-Lead  . EKG 12-Lead  . ED EKG within 10 minutes  . ED EKG within 10 minutes    IMPRESSION AND PLAN: 52 year old male patient with a known history of congestive heart failure, hypertension, myocardial infarction in the past, sleep apnea, stroke, ventricular tachycardia presented to the emergency room for chest pain.   -Chest pain Admit patient to telemetry Cycle troponin Cardiology consult and check echocardiogram Start patient on aspirin and PRN nitrates   -Syncope Check orthostatics Hold diuretics Check troponin and  echocardiogram  -Chronic congestive heart failure Stable Medical management  -DVT prophylaxis subcu Lovenox daily  -Uncontrolled hypertension Optimize blood pressure medications  -Hypokalemia Replace potassium  -Tobacco abuse Tobacco cessation counseled to the patient for 6 minutes Nicotine patch offered  All the records are reviewed and case discussed with ED provider. Management plans discussed with the patient, family and they are in agreement.  CODE STATUS:Full Code Code Status History    Date Active Date Inactive Code Status Order ID Comments User Context   03/26/2018 1849 03/29/2018 1950 Full Code [id number]  Shaune Pollack, MD Inpatient   07/17/2017 1524 07/19/2017 1742 Full Code [id number]  Ihor Austin, MD Inpatient   01/17/2017 0827 01/18/2017 1909 Full Code [id number]  Arnaldo Natal, MD Inpatient   12/15/2014 1547 12/23/2014 1443 Full Code [id number]  Love, Evlyn Kanner, PA-C Inpatient       TOTAL TIME TAKING CARE OF THIS PATIENT: 52 minutes.    Ihor Austin M.D on 09/09/2018 at 6:35 PM  Between 7am to 6pm - Pager - [phone]  After 6pm go to [email or web address] - password [email or web address] [facility]  [facility]  Office  [phone]  CC: Primary care physician; [name], MD

## 2018-09-09 NOTE — Progress Notes (Signed)
Pt is a high fall risk but refusing bed alarm, but was educated about safety. Will continue to monitor.

## 2018-09-09 NOTE — ED Triage Notes (Signed)
Pt arrived from home after sudden onset of chest pain around 1530. Pt states the pain was on the left side and felt like a dull achy pain and lasted roughly . PT states he stood up and felt dizzy and was able to safely sit down before he had syncopal episode. PT arrives a & o x 4. PT denies any current pain. Pt had orthostatic changes for ems with a blood pressure drop from systolic findings of 98 to 78. CBG 124. EMS reports pt was diaphoretic and cold upon their arrival to the house.

## 2018-09-09 NOTE — ED Provider Notes (Signed)
[facility] Emergency Department Provider Note ____________________________________________   I have reviewed the triage vital signs and the triage nursing note.  HISTORY  Chief Complaint Chest Pain   Historian Patient  HPI Andrew Sparks is a 52 y.o. male with a history of hypertension although he has been off of his 5 or 6 medications because he forgets to take them, but he has them in his bag with him, presents today for episode of syncope prior to arrival.  States he felt lightheaded and nauseated and then started to fall to the ground but did not get injured.  No chest pain at that point.  States that earlier today he had episode of central chest pain which was sharp and similar to prior episodes of anxiety.  No leg swelling.  No shortness of breath.  No pleuritic chest pain.  He has a tracheostomy secondary to sleep apnea.     Past Medical History:  Diagnosis Date  . Anxiety   . CHF (congestive heart failure) (HCC)   . Depression   . Hypersomnia with sleep apnea   . Hypertension   . Morbid obesity (HCC)   . NSTEMI (non-ST elevated myocardial infarction) (HCC) 2005  . Obesity hypoventilation syndrome (HCC)   . Shortness of breath dyspnea   . Sleep apnea   . Stroke (HCC)   . V-tach [facility])     Patient Active Problem List   Diagnosis Date Noted  . ARF (acute renal failure) (HCC) 03/26/2018  . CHF (congestive heart failure) (HCC) 07/17/2017  . 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 Panic disorder 01/18/2017  . Chest pain 01/17/2017  . Sleep apnea, obstructive 02/01/2016  . Tracheostomy care (HCC)   . 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 Debility 12/16/2014  . Obesity hypoventilation syndrome (HCC) 12/16/2014  . Encephalopathy pulmonary 12/15/2014    Past Surgical History:  Procedure Laterality Date  . TRACHEOSTOMY      Prior to Admission medications    Medication Sig Start Date End Date Taking? Authorizing Provider  acetaminophen (TYLENOL) 325 MG tablet Take by mouth.    [provider]  albuterol (PROVENTIL HFA;VENTOLIN HFA) 108 (90 Base) MCG/ACT inhaler Inhale 2 puffs into the lungs every 6 (six) hours as needed for wheezing or shortness of breath. 12/17/17   Governor Rooks, MD  ALPRAZolam Prudy Feeler) 0.25 MG tablet Take 1 tablet (0.25 mg total) by mouth daily as needed for anxiety (panic attack). 01/18/17   Clapacs, Jackquline Denmark, MD  amiodarone (PACERONE) 200 MG tablet Take 1 tablet (200 mg total) by mouth daily. 07/19/17   Houston Siren, MD  aspirin EC 81 MG tablet Take 81 mg by mouth daily.    [provider]  citalopram (CELEXA) 20 MG tablet Take 1 tablet by mouth daily. 03/05/18   [provider]  cloNIDine (CATAPRES) 0.1 MG tablet Take 1 tablet (0.1 mg total) by mouth 3 (three) times daily. Patient taking differently: Take 0.3 mg by mouth 3 (three) times daily.  07/19/17   Houston Siren, MD  cloNIDine (CATAPRES) 0.3 MG tablet  04/04/18   [provider]  famotidine (PEPCID) 20 MG tablet Take by mouth. 12/23/14   [provider]  FLUoxetine (PROZAC) 20 MG capsule Take 1 capsule (20 mg total) by mouth daily. 07/19/17   Houston Siren, MD  furosemide (LASIX) 40 MG tablet Take 1 tablet (40 mg total) by mouth daily. 07/19/17  Houston Siren, MD  hydrALAZINE (APRESOLINE) 100 MG tablet Take 1 tablet (100 mg total) by mouth 3 (three) times daily. 12/23/14   Love, Evlyn Kanner, PA-C  hydrochlorothiazide (HYDRODIURIL) 25 MG tablet Take by mouth. 08/30/15   [provider]  lisinopril (PRINIVIL,ZESTRIL) 10 MG tablet Take 1 tablet (10 mg total) by mouth daily. Patient taking differently: Take 20 mg by mouth daily.  07/19/17   Houston Siren, MD  lisinopril (PRINIVIL,ZESTRIL) 20 MG tablet  04/04/18   [provider]  metoprolol tartrate (LOPRESSOR) 25 MG tablet Take 1 tablet (25 mg total) by mouth 2 (two)  times daily. 07/19/17   Houston Siren, MD  nitroGLYCERIN (NITROSTAT) 0.4 MG SL tablet Place 1 tablet under the tongue every 5 (five) minutes as needed for chest pain.  02/13/18   [provider]  omeprazole (PRILOSEC) 40 MG capsule Take by mouth. 08/30/15   [provider]  potassium chloride SA (K-DUR,KLOR-CON) 20 MEQ tablet Take by mouth. 12/23/14   [provider]    No Known Allergies  Family History  Problem Relation Age of Onset  . Heart disease Father   . Diabetes type II Mother   . Breast cancer Mother     Social History Social History   Tobacco Use  . Smoking status: Current Every Day Smoker    Packs/day: 0.50    Years: 28.00    Pack years: 14.00    Types: Cigarettes  . Smokeless tobacco: Never Used  Substance Use Topics  . Alcohol use: Yes    Alcohol/week: 8.0 standard drinks    Types: 8 Cans of beer per week  . Drug use: No    Review of Systems  Constitutional: Negative for fever. Eyes: Negative for visual changes. ENT: Negative for sore throat. Cardiovascular: Positive for chest pain. Respiratory: Negative for shortness of breath. Gastrointestinal: Negative for abdominal pain, vomiting and diarrhea. Genitourinary: Negative for dysuria. Musculoskeletal: Negative for back pain. Skin: Negative for rash. Neurological: Negative for headache.  ____________________________________________   PHYSICAL EXAM:  VITAL SIGNS: ED Triage Vitals  Enc Vitals Group     BP 09/09/18 1630 (!) 152/84     Pulse Rate 09/09/18 1630 62     Resp 09/09/18 1630 13     Temp 09/09/18 1630 97.7 F (36.5 C)     Temp Source 09/09/18 1630 Oral     SpO2 09/09/18 1630 99 %     Weight 09/09/18 1631 260 lb (117.9 kg)     Height 09/09/18 1631 5\' 11"  (1.803 m)     Head Circumference --      Peak Flow --      Pain Score 09/09/18 1631 0     Pain Loc --      Pain Edu? --      Excl. in GC? --      Constitutional: Alert and oriented.  HEENT      Head:  Normocephalic and atraumatic.      Eyes: Conjunctivae are normal. Pupils equal and round.       Ears:         Nose: No congestion/rhinnorhea.      Mouth/Throat: Mucous membranes are moist.      Neck: No stridor. Cardiovascular/Chest: Normal rate, regular rhythm.  No murmurs, rubs, or gallops. Respiratory: Normal respiratory effort without tachypnea nor retractions. Breath sounds are clear and equal bilaterally. No wheezes/rales/rhonchi. Gastrointestinal: Soft. No distention, no guarding, no rebound. Nontender.    Genitourinary/rectal:Deferred Musculoskeletal: Nontender  with normal range of motion in all extremities. No joint effusions.  No lower extremity tenderness.  No edema. Neurologic:  Normal speech and language. No gross or focal neurologic deficits are appreciated. Skin:  Skin is warm, dry and intact. No rash noted. Psychiatric: Mood and affect are normal. Speech and behavior are normal. Patient exhibits appropriate insight and judgment.   ____________________________________________  LABS (pertinent positives/negatives) I, Governor Rooks, MD the attending physician have reviewed the labs noted below.  Labs Reviewed  BASIC METABOLIC PANEL - Abnormal; Notable for the following components:      Result Value   Potassium 3.0 (*)    Glucose, Bld 117 (*)    All other components within normal limits  CBC - Abnormal; Notable for the following components:   RBC 4.29 (*)    RDW 14.8 (*)    All other components within normal limits  TROPONIN I - Abnormal; Notable for the following components:   Troponin I 0.05 (*)    All other components within normal limits    ____________________________________________    EKG I, Governor Rooks, MD, the attending physician have personally viewed and interpreted all ECGs.  63 bpm.  Normal sinus rhythm, premature atrial comp traction.  Q waves inferiorly.  T waves inverted laterally.  His EKG show A. fib, but do show inferior Q wave, but the  lateral T wave inversions appear to be new from prior. ____________________________________________  RADIOLOGY   Chest x-ray two-view:  IMPRESSION: 1. No acute abnormality. 2. Stable cardiomegaly and mild pulmonary vascular congestion. 3. Stable changes of COPD and chronic bronchitis. __________________________________________  PROCEDURES  Procedure(s) performed: None  Procedures  Critical Care performed: None   ____________________________________________  ED COURSE / ASSESSMENT AND PLAN  Pertinent labs & imaging results that were available during my care of the patient were reviewed by me and considered in my medical decision making (see chart for details).    Here for evaluation of syncopal episode, although in discussion with him he did have an episode of chest pain earlier today although he states that the chest pain felt similar to prior panic attacks.  He did not have chest pain associated with the syncopal episode.  Here he is extremely hypertensive, but it appears that he is on difficult to control blood pressure because he is on multiple blood pressure medications but has not been taking them.  We will give him a couple of his blood pressure medications here.  I am to give him aspirin.  EKG shows some T wave inversions laterally which are new.  Initial troponin 0 0.05.  Unclear whether or not this is troponin leak in the setting of the elevated blood pressures or the syncope.  By chart review it looks like he has a history of V. tach.  Likely is a history of A. fib on prior EKGs and is on amiodarone.  Given his significant history and elevated troponin and change EKG, I am recommending hospital admission.     CONSULTATIONS:   Hospitalist for admission   Patient / Family / Caregiver informed of clinical course, medical decision-making process, and agree with plan.    ___________________________________________   FINAL CLINICAL IMPRESSION(S) / ED  DIAGNOSES   Final diagnoses:  Syncope, unspecified syncope type  Hypertension, unspecified type  Chest pain, unspecified type      ___________________________________________         Note: This dictation was prepared with Dragon dictation. Any transcriptional errors that result from this process are  unintentional    Governor Rooks, MD 09/09/18 1754

## 2018-09-09 NOTE — Progress Notes (Signed)
Advanced care plan.  Purpose of the Encounter: CODE STATUS  Parties in Attendance: Patient  Patient's Decision Capacity: Good  Subjective/Patient's story: Presented to emergency room with dizziness and chest pain   Objective/Medical story Needs evaluation and work-up for syncope and chest pain   Goals of care determination:  Advance care directives and goals of care discussed Patient wants everything done which includes CPR, intubation ventilator if the need arises   CODE STATUS: Full code   Time spent discussing advanced care planning: 16 minutes

## 2018-09-10 ENCOUNTER — Other Ambulatory Visit: Payer: Self-pay

## 2018-09-10 ENCOUNTER — Ambulatory Visit: Payer: Self-pay | Admitting: Cardiovascular Disease

## 2018-09-10 ENCOUNTER — Encounter
Admission: EM | Disposition: A | Discharge status: Home / Self Care | Payer: Self-pay | Source: Home / Self Care | Attending: Emergency Medicine

## 2018-09-10 ENCOUNTER — Encounter: Payer: Self-pay | Admitting: Emergency Medicine

## 2018-09-10 ENCOUNTER — Observation Stay
Admit: 2018-09-10 | Discharge: 2018-09-10 | Disposition: A | Discharge status: Home / Self Care | Payer: Medicare Other | Attending: Internal Medicine | Admitting: Internal Medicine

## 2018-09-10 HISTORY — PX: LEFT HEART CATH AND CORONARY ANGIOGRAPHY: CATH118249

## 2018-09-10 LAB — BASIC METABOLIC PANEL
ANION GAP: 8 (ref 5–15)
ANION GAP: 9 (ref 5–15)
BUN: 18 mg/dL (ref 6–20)
BUN: 20 mg/dL (ref 6–20)
CHLORIDE: 100 mmol/L (ref 98–111)
CO2: 28 mmol/L (ref 22–32)
CO2: 28 mmol/L (ref 22–32)
CREATININE: 1.27 mg/dL — AB (ref 0.61–1.24)
Calcium: 8.6 mg/dL — ABNORMAL LOW (ref 8.9–10.3)
Calcium: 8.8 mg/dL — ABNORMAL LOW (ref 8.9–10.3)
Chloride: 99 mmol/L (ref 98–111)
Creatinine, Ser: 1.16 mg/dL (ref 0.61–1.24)
GFR calc Af Amer: >60 mL/min (ref >60)
GFR calc non Af Amer: >60 mL/min (ref >60)
Glucose, Bld: 96 mg/dL (ref 70–99)
Glucose, Bld: 101 mg/dL — ABNORMAL HIGH (ref 70–99)
POTASSIUM: 3.4 mmol/L — AB (ref 3.5–5.1)
POTASSIUM: 3.7 mmol/L (ref 3.5–5.1)
SODIUM: 136 mmol/L (ref 135–145)
SODIUM: 136 mmol/L (ref 135–145)

## 2018-09-10 LAB — CBC
HCT: 42.1 % (ref 39.0–52.0)
HEMATOCRIT: 41 % (ref 40.0–52.0)
HEMOGLOBIN: 14.2 g/dL (ref 13.0–17.0)
Hemoglobin: 13.9 g/dL (ref 13.0–18.0)
MCH: 33.4 pg (ref 26.0–34.0)
MCH: 32.7 pg (ref 26.0–34.0)
MCHC: 33.9 g/dL (ref 32.0–36.0)
MCHC: 33.7 g/dL (ref 30.0–36.0)
MCV: 98.4 fL (ref 80.0–100.0)
MCV: 97 fL (ref 80.0–100.0)
NRBC: 0 % (ref 0.0–0.2)
PLATELETS: 242 10*3/uL (ref 150–400)
Platelets: 234 10*3/uL (ref 150–440)
RBC: 4.17 MIL/uL — AB (ref 4.40–5.90)
RBC: 4.34 MIL/uL (ref 4.22–5.81)
RDW: 15.3 % — AB (ref 11.5–14.5)
RDW: 14.2 % (ref 11.5–15.5)
WBC: 6.5 10*3/uL (ref 3.8–10.6)
WBC: 6 10*3/uL (ref 4.0–10.5)

## 2018-09-10 LAB — LIPID PANEL
Cholesterol: 202 mg/dL — ABNORMAL HIGH (ref 0–200)
HDL: 75 mg/dL (ref >40)
LDL CALC: 112 mg/dL — AB (ref 0–99)
Total CHOL/HDL Ratio: 2.7 RATIO
Triglycerides: 73 mg/dL (ref <150)
VLDL: 15 mg/dL (ref 0–40)

## 2018-09-10 LAB — MAGNESIUM: MAGNESIUM: 1.9 mg/dL (ref 1.7–2.4)

## 2018-09-10 LAB — TROPONIN I
TROPONIN I: 0.05 ng/mL — AB (ref <0.03)
TROPONIN I: 0.04 ng/mL — AB (ref <0.03)

## 2018-09-10 LAB — ECHOCARDIOGRAM COMPLETE
HEIGHTINCHES: 71 in
Weight: 3992 oz

## 2018-09-10 LAB — PROTIME-INR
INR: 0.99
PROTHROMBIN TIME: 13 s (ref 11.4–15.2)

## 2018-09-10 SURGERY — LEFT HEART CATH AND CORONARY ANGIOGRAPHY
Anesthesia: Moderate Sedation | Laterality: Right

## 2018-09-10 MED ORDER — MIDAZOLAM HCL 2 MG/2ML IJ SOLN
INTRAMUSCULAR | Status: DC | PRN
  Administered 2018-09-10: 1 mg via INTRAVENOUS

## 2018-09-10 MED ORDER — MIDAZOLAM HCL 2 MG/2ML IJ SOLN
INTRAMUSCULAR | Status: AC
  Filled 2018-09-10: qty 2

## 2018-09-10 MED ORDER — ACETAMINOPHEN 325 MG PO TABS: 650.0000 mg | ORAL_TABLET | ORAL | Status: DC | PRN

## 2018-09-10 MED ORDER — HEPARIN (PORCINE) IN NACL 1000-0.9 UT/500ML-% IV SOLN
INTRAVENOUS | Status: AC
  Filled 2018-09-10: qty 1000

## 2018-09-10 MED ORDER — ALPRAZOLAM 0.5 MG PO TABS
0.2500 mg | ORAL_TABLET | Freq: Once | ORAL | Status: AC
  Administered 2018-09-10: 0.25 mg via ORAL
  Filled 2018-09-10: qty 1

## 2018-09-10 MED ORDER — FENTANYL CITRATE (PF) 100 MCG/2ML IJ SOLN
INTRAMUSCULAR | Status: DC | PRN
  Administered 2018-09-10: 50 ug via INTRAVENOUS

## 2018-09-10 MED ORDER — ASPIRIN 81 MG PO CHEW: 81.0000 mg | CHEWABLE_TABLET | ORAL | Status: DC

## 2018-09-10 MED ORDER — SODIUM CHLORIDE 0.9 % IV SOLN
INTRAVENOUS | Status: DC
  Administered 2018-09-10: 16:00:00 via INTRAVENOUS

## 2018-09-10 MED ORDER — SODIUM CHLORIDE 0.9 % IV SOLN: 250.0000 mL | INTRAVENOUS | Status: DC | PRN

## 2018-09-10 MED ORDER — ONDANSETRON HCL 4 MG/2ML IJ SOLN: 4.0000 mg | Freq: Four times a day (QID) | INTRAMUSCULAR | Status: DC | PRN

## 2018-09-10 MED ORDER — FENTANYL CITRATE (PF) 100 MCG/2ML IJ SOLN
INTRAMUSCULAR | Status: AC
  Filled 2018-09-10: qty 2

## 2018-09-10 MED ORDER — SODIUM CHLORIDE 0.9% FLUSH
3.0000 mL | Freq: Two times a day (BID) | INTRAVENOUS | Status: DC
  Administered 2018-09-11: 3 mL via INTRAVENOUS

## 2018-09-10 MED ORDER — SODIUM CHLORIDE 0.9% FLUSH: 3.0000 mL | INTRAVENOUS | Status: DC | PRN

## 2018-09-10 MED ORDER — SODIUM CHLORIDE 0.9% FLUSH
3.0000 mL | Freq: Two times a day (BID) | INTRAVENOUS | Status: DC
  Administered 2018-09-10: 3 mL via INTRAVENOUS

## 2018-09-10 MED ORDER — SODIUM CHLORIDE 0.9 % WEIGHT BASED INFUSION: 1.0000 mL/kg/h | INTRAVENOUS | Status: DC

## 2018-09-10 MED ORDER — IOPAMIDOL (ISOVUE-300) INJECTION 61%
INTRAVENOUS | Status: DC | PRN
  Administered 2018-09-10: 230 mL via INTRA_ARTERIAL

## 2018-09-10 MED ORDER — SODIUM CHLORIDE 0.9 % WEIGHT BASED INFUSION
1.0000 mL/kg/h | INTRAVENOUS | Status: AC
  Administered 2018-09-10: 1 mL/kg/h via INTRAVENOUS

## 2018-09-10 SURGICAL SUPPLY — 14 items
CATH AMP RT 5F (CATHETERS) ×3 IMPLANT
CATH INFINITI 5 FR 3DRC (CATHETERS) ×3 IMPLANT
CATH INFINITI 5 FR JR3.5 (CATHETERS) ×3 IMPLANT
CATH INFINITI 5 FR MPA2 (CATHETERS) ×3 IMPLANT
CATH INFINITI 5FR ANG PIGTAIL (CATHETERS) ×3 IMPLANT
CATH INFINITI 5FR JL4 (CATHETERS) ×3 IMPLANT
CATH INFINITI 5FR JL5 (CATHETERS) ×3 IMPLANT
CATH INFINITI JR4 5F (CATHETERS) ×3 IMPLANT
DEVICE CLOSURE MYNXGRIP 5F (Vascular Products) ×3 IMPLANT
KIT MANI 3VAL PERCEP (MISCELLANEOUS) ×3 IMPLANT
NEEDLE PERC 18GX7CM (NEEDLE) ×3 IMPLANT
PACK CARDIAC CATH (CUSTOM PROCEDURE TRAY) ×3 IMPLANT
SHEATH AVANTI 5FR X 11CM (SHEATH) ×3 IMPLANT
WIRE GUIDERIGHT .035X150 (WIRE) ×3 IMPLANT

## 2018-09-10 NOTE — Consult Note (Signed)
Andrew Sparks is a 52 y.o. male  [id number]  Primary Cardiologist: Adrian Blackwater Reason for Consultation: Syncope and questionable chest pain and mildly elevated troponin  HPI: This is a 52 year old African-American male with a history of stroke history of atrial fibrillation with rapid ventricular response rate when he was seen in December 2018 in office he was started on amiodarone p.o. as well as Xarelto.  He came into the hospital with episode where he felt dizzy and passed out.   Review of Systems: Occasional chest pain and syncope   Past Medical History:  Diagnosis Date  . Anxiety   . CHF (congestive heart failure) (HCC)   . Depression   . Hypersomnia with sleep apnea   . Hypertension   . Morbid obesity (HCC)   . NSTEMI (non-ST elevated myocardial infarction) (HCC) 2005  . Obesity hypoventilation syndrome (HCC)   . Shortness of breath dyspnea   . Sleep apnea   . Stroke (HCC)   . V-tach [facility])     Medications Prior to Admission  Medication Sig Dispense Refill  . amiodarone (PACERONE) 200 MG tablet Take 1 tablet (200 mg total) by mouth daily. 30 tablet 0  . cloNIDine (CATAPRES) 0.1 MG tablet Take 1 tablet (0.1 mg total) by mouth 3 (three) times daily. (Patient taking differently: Take 0.3 mg by mouth 3 (three) times daily. ) 90 tablet 0  . hydrALAZINE (APRESOLINE) 100 MG tablet Take 1 tablet (100 mg total) by mouth 3 (three) times daily. 90 tablet 1  . hydrochlorothiazide (HYDRODIURIL) 25 MG tablet Take 25 mg by mouth daily.     Marland Kitchen lisinopril (PRINIVIL,ZESTRIL) 10 MG tablet Take 1 tablet (10 mg total) by mouth daily. (Patient taking differently: Take 20 mg by mouth daily. ) 30 tablet 0  . metoprolol tartrate (LOPRESSOR) 25 MG tablet Take 1 tablet (25 mg total) by mouth 2 (two) times daily. 60 tablet 0  . albuterol (PROVENTIL HFA;VENTOLIN HFA) 108 (90 Base) MCG/ACT inhaler Inhale 2 puffs into the lungs every 6 (six) hours as needed for wheezing or shortness of breath.  (Patient not taking: Reported on 09/09/2018) 1 Inhaler 0  . ALPRAZolam (XANAX) 0.25 MG tablet Take 1 tablet (0.25 mg total) by mouth daily as needed for anxiety (panic attack). (Patient not taking: Reported on 09/09/2018) 20 tablet 0  . FLUoxetine (PROZAC) 20 MG capsule Take 1 capsule (20 mg total) by mouth daily. (Patient not taking: Reported on 09/09/2018) 30 capsule 0  . furosemide (LASIX) 40 MG tablet Take 1 tablet (40 mg total) by mouth daily. (Patient not taking: Reported on 09/09/2018) 30 tablet 0     . amiodarone  200 mg Oral Daily  . aspirin EC  81 mg Oral Daily  . cloNIDine  0.1 mg Oral TID  . enoxaparin (LOVENOX) injection  40 mg Subcutaneous Q24H  . FLUoxetine  20 mg Oral Daily  . furosemide  40 mg Oral Daily  . hydrALAZINE  100 mg Oral TID  . lisinopril  20 mg Oral Daily  . metoprolol tartrate  25 mg Oral BID  . nicotine  21 mg Transdermal Daily  . pantoprazole  40 mg Oral Daily  . potassium chloride  20 mEq Oral Daily  . sodium chloride flush  3 mL Intravenous Q12H    Infusions: . sodium chloride      No Known Allergies  Social History   Socioeconomic History  . Marital status: Single    Spouse name: Not on file  .  Number of children: Not on file  . Years of education: Not on file  . Highest education level: Not on file  Occupational History  . Occupation: disabled  Social Needs  . Financial resource strain: Not on file  . Food insecurity:    Worry: Not on file    Inability: Not on file  . Transportation needs:    Medical: Not on file    Non-medical: Not on file  Tobacco Use  . Smoking status: Current Every Day Smoker    Packs/day: 0.50    Years: 28.00    Pack years: 14.00    Types: Cigarettes  . Smokeless tobacco: Never Used  Substance and Sexual Activity  . Alcohol use: Yes    Alcohol/week: 8.0 standard drinks    Types: 8 Cans of beer per week  . Drug use: No  . Sexual activity: Yes  Lifestyle  . Physical activity:    Days per week: Not on file     Minutes per session: Not on file  . Stress: Not on file  Relationships  . Social connections:    Talks on phone: Not on file    Gets together: Not on file    Attends religious service: Not on file    Active member of club or organization: Not on file    Attends meetings of clubs or organizations: Not on file    Relationship status: Not on file  . Intimate partner violence:    Fear of current or ex partner: Not on file    Emotionally abused: Not on file    Physically abused: Not on file    Forced sexual activity: Not on file  Other Topics Concern  . Not on file  Social History Narrative  . Not on file    Family History  Problem Relation Age of Onset  . Heart disease Father   . Diabetes type II Mother   . Breast cancer Mother     PHYSICAL EXAM: Vitals:   09/10/18 0436 09/10/18 0732  BP: (!) 129/91 118/70  Pulse: (!) 54 (!) 53  Resp: 18 18  Temp: 98.1 F (36.7 C) (!) 97.4 F (36.3 C)  SpO2: 96% 96%     Intake/Output Summary (Last 24 hours) at 09/10/2018 0939 Last data filed at 09/10/2018 0500 Gross per 24 hour  Intake -  Output 0 ml  Net 0 ml    General:  Well appearing. No respiratory difficulty HEENT: normal Neck: supple. no JVD. Carotids 2+ bilat; no bruits. No lymphadenopathy or thryomegaly appreciated. Cor: PMI nondisplaced. Regular rate & rhythm. No rubs, gallops or murmurs. Lungs: clear Abdomen: soft, nontender, nondistended. No hepatosplenomegaly. No bruits or masses. Good bowel sounds. Extremities: no cyanosis, clubbing, rash, edema Neuro: alert & oriented x 3, cranial nerves grossly intact. moves all 4 extremities w/o difficulty. Affect pleasant.  ECG: Sinus rhythm with possible ischemic changes in the lateral leads  Results for orders placed or performed during the hospital encounter of 09/09/18 (from the past 24 hour(s))  Basic metabolic panel     Status: Abnormal   Collection Time: 09/09/18  4:32 PM  Result Value Ref Range   Sodium 137 135 -  145 mmol/L   Potassium 3.0 (L) 3.5 - 5.1 mmol/L   Chloride 98 98 - 111 mmol/L   CO2 26 22 - 32 mmol/L   Glucose, Bld 117 (H) 70 - 99 mg/dL   BUN 12 6 - 20 mg/dL   Creatinine, Ser 1.61 0.61 -  1.24 mg/dL   Calcium 9.0 8.9 - 14.7 mg/dL   GFR calc non Af Amer >60 >60 mL/min   GFR calc Af Amer >60 >60 mL/min   Anion gap 13 5 - 15  CBC     Status: Abnormal   Collection Time: 09/09/18  4:32 PM  Result Value Ref Range   WBC 7.1 3.8 - 10.6 K/uL   RBC 4.29 (L) 4.40 - 5.90 MIL/uL   Hemoglobin 14.4 13.0 - 18.0 g/dL   HCT 82.9 56.2 - 13.0 %   MCV 95.9 80.0 - 100.0 fL   MCH 33.5 26.0 - 34.0 pg   MCHC 35.0 32.0 - 36.0 g/dL   RDW 86.5 (H) 78.4 - 69.6 %   Platelets 245 150 - 440 K/uL  Troponin I     Status: Abnormal   Collection Time: 09/09/18  4:32 PM  Result Value Ref Range   Troponin I 0.05 (HH) <0.03 ng/mL  Troponin I     Status: Abnormal   Collection Time: 09/09/18 10:35 PM  Result Value Ref Range   Troponin I 0.04 (HH) <0.03 ng/mL  Troponin I     Status: Abnormal   Collection Time: 09/10/18  4:19 AM  Result Value Ref Range   Troponin I 0.05 (HH) <0.03 ng/mL  Lipid panel     Status: Abnormal   Collection Time: 09/10/18  4:19 AM  Result Value Ref Range   Cholesterol 202 (H) 0 - 200 mg/dL   Triglycerides 73 <295 mg/dL   HDL 75 >28 mg/dL   Total CHOL/HDL Ratio 2.7 RATIO   VLDL 15 0 - 40 mg/dL   LDL Cholesterol 413 (H) 0 - 99 mg/dL  Basic metabolic panel     Status: Abnormal   Collection Time: 09/10/18  4:19 AM  Result Value Ref Range   Sodium 136 135 - 145 mmol/L   Potassium 3.4 (L) 3.5 - 5.1 mmol/L   Chloride 100 98 - 111 mmol/L   CO2 28 22 - 32 mmol/L   Glucose, Bld 96 70 - 99 mg/dL   BUN 18 6 - 20 mg/dL   Creatinine, Ser 2.44 0.61 - 1.24 mg/dL   Calcium 8.6 (L) 8.9 - 10.3 mg/dL   GFR calc non Af Amer >60 >60 mL/min   GFR calc Af Amer >60 >60 mL/min   Anion gap 8 5 - 15  CBC     Status: Abnormal   Collection Time: 09/10/18  4:19 AM  Result Value Ref Range   WBC 6.5 3.8  - 10.6 K/uL   RBC 4.17 (L) 4.40 - 5.90 MIL/uL   Hemoglobin 13.9 13.0 - 18.0 g/dL   HCT 01.0 27.2 - 53.6 %   MCV 98.4 80.0 - 100.0 fL   MCH 33.4 26.0 - 34.0 pg   MCHC 33.9 32.0 - 36.0 g/dL   RDW 64.4 (H) 03.4 - 74.2 %   Platelets 234 150 - 440 K/uL   Dg Chest 2 View  Result Date: 09/09/2018 CLINICAL DATA:  Sudden onset chest pain at 1530 hours today. The pain was on the left side and lasted approximately 45 minutes. Smoker. EXAM: CHEST - 2 VIEW COMPARISON:  03/26/2018. FINDINGS: The tracheostomy tube has been removed. Stable enlarged cardiac silhouette and mildly tortuous thoracic aorta. Clear lungs. The pulmonary vasculature is mildly prominent without significant change. The lungs remain hyperexpanded with mild diffuse peribronchial thickening and accentuation of the interstitial markings, without significant change. Unremarkable bones. IMPRESSION: 1. No acute abnormality. 2. Stable cardiomegaly  and mild pulmonary vascular congestion. 3. Stable changes of COPD and chronic bronchitis. Electronically Signed   By: Beckie Salts M.D.   On: 09/09/2018 17:01     ASSESSMENT AND PLAN: Acute coronary syndrome with mildly elevated troponin and presyncope and a stress test done in December 2018 showed inferior wall fixed defect suggestive of a equivocal stress test with normal ejection fraction.  Patient was supposed to have CCTA but never showed up and have been followed up.  If he is not been taking Xarelto will consider doing cardiac catheterization.  Alwilda Gilland A

## 2018-09-10 NOTE — Plan of Care (Signed)
  Problem: Health Behavior/Discharge Planning: Goal: Ability to manage health-related needs will improve Outcome: Progressing   Problem: Pain Managment: Goal: General experience of comfort will improve Outcome: Progressing   Problem: Cardiac: Goal: Ability to achieve and maintain adequate cardiovascular perfusion will improve Outcome: Progressing

## 2018-09-10 NOTE — Progress Notes (Signed)
Pt has an old stoma from having trach. Pt sating at 98 % on admission and refusing the trach mask at this time. Pt is in no distress on admission. VS were normal except BP at 143/103. Lisinopril, metoprolol and clonidine were administered and BP went down to 125/84 HR after 1 hour. Will continue to monitor.

## 2018-09-10 NOTE — Progress Notes (Signed)
[facility] - [place] at [facility]   PATIENT NAME: Andrew Sparks    MR#:  [id number]  DATE OF BIRTH:  01-23-1966  SUBJECTIVE:  came in with shortness of breath and dizziness. Seen by cardiology awaiting cardiac cath today. Denies shortness of breath. There are issues with noncompliance of medication at home  REVIEW OF SYSTEMS:   Review of Systems  Constitutional: Negative for chills, fever and weight loss.  HENT: Negative for ear discharge, ear pain and nosebleeds.   Eyes: Negative for blurred vision, pain and discharge.  Respiratory: Negative for sputum production, shortness of breath, wheezing and stridor.   Cardiovascular: Positive for chest pain. Negative for palpitations, orthopnea and PND.  Gastrointestinal: Negative for abdominal pain, diarrhea, nausea and vomiting.  Genitourinary: Negative for frequency and urgency.  Musculoskeletal: Negative for back pain and joint pain.  Neurological: Negative for sensory change, speech change, focal weakness and weakness.  Psychiatric/Behavioral: Negative for depression and hallucinations. The patient is not nervous/anxious.    Tolerating Diet:yes Tolerating PT: ambulatory  DRUG ALLERGIES:  No Known Allergies  VITALS:  Blood pressure 118/70, pulse (!) 53, temperature (!) 97.4 F (36.3 C), temperature source Oral, resp. rate 18, height 5\' 11"  (1.803 m), weight 113.2 kg, SpO2 96 %.  PHYSICAL EXAMINATION:   Physical Exam  GENERAL:  52 y.o.-year-old patient lying in the bed with no acute distress. obese EYES: Pupils equal, round, reactive to light and accommodation. No scleral icterus. Extraocular muscles intact.  HEENT: Head atraumatic, normocephalic. Oropharynx and nasopharynx clear.  NECK:  Supple, no jugular venous distention. No thyroid enlargement, no tenderness. Previous trach+ LUNGS: Normal breath sounds bilaterally, no wheezing, rales, rhonchi. No use of accessory muscles of respiration.   CARDIOVASCULAR: S1, S2 normal. No murmurs, rubs, or gallops.  ABDOMEN: Soft, nontender, nondistended. Bowel sounds present. No organomegaly or mass.  EXTREMITIES: No cyanosis, clubbing or edema b/l.    NEUROLOGIC: Cranial nerves II through XII are intact. No focal Motor or sensory deficits b/l.   PSYCHIATRIC:  patient is alert and oriented x 3.  SKIN: No obvious rash, lesion, or ulcer.   LABORATORY PANEL:  CBC Recent Labs  Lab 09/10/18 1046  WBC 6.0  HGB 14.2  HCT 42.1  PLT 242    Chemistries  Recent Labs  Lab 09/10/18 1046  NA 136  K 3.7  CL 99  CO2 28  GLUCOSE 101*  BUN 20  CREATININE 1.27*  CALCIUM 8.8*  MG 1.9   Cardiac Enzymes Recent Labs  Lab 09/10/18 1010  TROPONINI 0.04*   RADIOLOGY:  Dg Chest 2 View  Result Date: 09/09/2018 CLINICAL DATA:  Sudden onset chest pain at 1530 hours today. The pain was on the left side and lasted approximately 45 minutes. Smoker. EXAM: CHEST - 2 VIEW COMPARISON:  03/26/2018. FINDINGS: The tracheostomy tube has been removed. Stable enlarged cardiac silhouette and mildly tortuous thoracic aorta. Clear lungs. The pulmonary vasculature is mildly prominent without significant change. The lungs remain hyperexpanded with mild diffuse peribronchial thickening and accentuation of the interstitial markings, without significant change. Unremarkable bones. IMPRESSION: 1. No acute abnormality. 2. Stable cardiomegaly and mild pulmonary vascular congestion. 3. Stable changes of COPD and chronic bronchitis. Electronically Signed   By: Beckie Salts M.D.   On: 09/09/2018 17:01   ASSESSMENT AND PLAN:  Andrew Sparks is a 52 y.o. male with a history of hypertension although he has been off of his 5 or 6 medications because he forgets to take them,  but he has them in his bag with him, presents today for episode of syncope prior to arrival.  States he felt lightheaded and nauseated and then started to fall to the ground but did not get  injured  *Chest pain and dizziness with mild elevated troponin Cardiology consult with Dr. Lennette Bihari noted. Patient is awaiting for cardiac cath.  Start patient on aspirin and PRN nitrates   *Syncope Check orthostatics Hold diuretics bp stable  *Chronic congestive heart failure Medical management  *DVT prophylaxis subcu Lovenox daily  *Uncontrolled hypertension Optimize blood pressure medications d/with patient's regarding compliance for medications.   *Hypokalemia Replace potassium  *Tobacco abuse Tobacco cessation counseled to the patient for 6 minutes Nicotine patch offered   Case discussed with Care Management/Social Worker. Management plans discussed with the patient, family and they are in agreement.  CODE STATUS: full  DVT Prophylaxis: lovenox  TOTAL TIME TAKING CARE OF THIS PATIENT: **30 minutes.  >50% time spent on counselling and coordination of care  POSSIBLE D/C IN *1-2* DAYS, DEPENDING ON CLINICAL CONDITION.  Note: This dictation was prepared with Dragon dictation along with smaller phrase technology. Any transcriptional errors that result from this process are unintentional.  [name] M.D on 09/10/2018 at 3:14 PM  Between 7am to 6pm - Pager - [phone]  After 6pm go to [email or web address] - password [email or web address]  [facility]  Office  [phone]  CC: Primary care physician; Andrew Sparks, MDPatient ID: Andrew Sparks, male   DOB: [birth year], 52 y.o.   MRN: [id number]

## 2018-09-10 NOTE — Plan of Care (Signed)
  Problem: Health Behavior/Discharge Planning: Goal: Ability to manage health-related needs will improve Outcome: Progressing   Problem: Pain Managment: Goal: General experience of comfort will improve Outcome: Progressing   

## 2018-09-10 NOTE — Progress Notes (Signed)
*  PRELIMINARY RESULTS* Echocardiogram 2D Echocardiogram has been performed.  [name] Fumiko Cham 09/10/2018, 10:08 AM

## 2018-09-10 NOTE — Plan of Care (Signed)

## 2018-09-11 ENCOUNTER — Encounter: Payer: Self-pay | Admitting: Cardiovascular Disease

## 2018-09-11 LAB — HIV ANTIBODY (ROUTINE TESTING W REFLEX): HIV Screen 4th Generation wRfx: NONREACTIVE

## 2018-09-11 MED ORDER — ASPIRIN EC 81 MG PO TBEC: 81.0000 mg | DELAYED_RELEASE_TABLET | Freq: Every day | ORAL | Status: DC

## 2018-09-11 NOTE — Care Management Note (Signed)
Case Management Note  Patient Details  Name: Andrew Sparks MRN: [id number] Date of Birth: 02/01/1966  Subjective/Objective:    Patient is from home; placed in observation for chest pain and dizziness.  Patient was off floor yesterday when RNCM went to present MOON letter.  Presented first thing this morning.  Lives at home with a cousin.  Is on disability and receives around $600.00/mo.  States it's difficult to pay for his medications.   He has a functioning scale and weighs himself daily. Has O2 at home; recently had trach removed.   No transportation issues.  Current with PCP.                  Action/Plan:[facility]CM [facility] [phone].  Spoke with pharmacist and he explained that the patient pays around $1 for his medications.    Expected Discharge Date:                  Expected Discharge Plan:  Home/Self Care  In-House Referral:     Discharge planning Services  CM Consult  Post Acute Care Choice:    Choice offered to:     DME Arranged:    DME Agency:     HH Arranged:    HH Agency:     Status of Service:  Completed, signed off  If discussed at [employer] of Stay Meetings, dates discussed:    Additional Comments:  Sherren Kerns, RN 09/11/2018, 8:26 AM

## 2018-09-11 NOTE — Progress Notes (Signed)
Andrew Sparks to be D/C'd Home per MD order.  Discussed prescriptions and follow up appointments with the patient. Prescriptions given to patient, medication list explained in detail. Pt verbalized understanding. Vascular discharge instructions given to patient.   Allergies as of 09/11/2018   No Known Allergies     Medication List    TAKE these medications   albuterol 108 (90 Base) MCG/ACT inhaler Commonly known as:  PROVENTIL HFA;VENTOLIN HFA Inhale 2 puffs into the lungs every 6 (six) hours as needed for wheezing or shortness of breath.   ALPRAZolam 0.25 MG tablet Commonly known as:  XANAX Take 1 tablet (0.25 mg total) by mouth daily as needed for anxiety (panic attack).   amiodarone 200 MG tablet Commonly known as:  PACERONE Take 1 tablet (200 mg total) by mouth daily.   aspirin EC 81 MG tablet Take 1 tablet (81 mg total) by mouth daily.   cloNIDine 0.1 MG tablet Commonly known as:  CATAPRES Take 1 tablet (0.1 mg total) by mouth 3 (three) times daily. What changed:  how much to take   FLUoxetine 20 MG capsule Commonly known as:  PROZAC Take 1 capsule (20 mg total) by mouth daily.   furosemide 40 MG tablet Commonly known as:  LASIX Take 1 tablet (40 mg total) by mouth daily.   hydrALAZINE 100 MG tablet Commonly known as:  APRESOLINE Take 1 tablet (100 mg total) by mouth 3 (three) times daily.   hydrochlorothiazide 25 MG tablet Commonly known as:  HYDRODIURIL Take 25 mg by mouth daily.   lisinopril 10 MG tablet Commonly known as:  PRINIVIL,ZESTRIL Take 1 tablet (10 mg total) by mouth daily. What changed:  how much to take   metoprolol tartrate 25 MG tablet Commonly known as:  LOPRESSOR Take 1 tablet (25 mg total) by mouth 2 (two) times daily.       Vitals:   09/11/18 0822 09/11/18 0823  BP: 110/87 (!) 117/94  Pulse: (!) 56 (!) 55  Resp: 18 18  Temp:    SpO2: 98% 95%    Tele box removed and returned. Skin clean, dry and intact without evidence  of skin break down, no evidence of skin tears noted. IV catheter discontinued intact. Site without signs and symptoms of complications. Dressing and pressure applied. Pt denies pain at this time. No complaints noted.  An After Visit Summary was printed and given to the patient. Patient escorted via WC, and D/C home via private auto.  Rigoberto Noel

## 2018-09-11 NOTE — Discharge Instructions (Signed)
Resume diet and activity as before ° ° °

## 2018-09-11 NOTE — Progress Notes (Signed)
SUBJECTIVE: Feeling well s/p cath. No further dizziness or syncope.   Vitals:   09/11/18 0405 09/11/18 0820 09/11/18 0822 09/11/18 0823  BP: 110/78 129/85 110/87 (!) 117/94  Pulse: (!) 51 (!) 57 (!) 56 (!) 55  Resp: 19 18 18    Temp: 97.7 F (36.5 C)     TempSrc: Oral     SpO2: 95% 96% 98% 95%  Weight:      Height:        Intake/Output Summary (Last 24 hours) at 09/11/2018 0914 Last data filed at 09/10/2018 1958 Gross per 24 hour  Intake -  Output 350 ml  Net -350 ml    LABS: Basic Metabolic Panel: Recent Labs    09/10/18 0419 09/10/18 1046  NA 136 136  K 3.4* 3.7  CL 100 99  CO2 28 28  GLUCOSE 96 101*  BUN 18 20  CREATININE 1.16 1.27*  CALCIUM 8.6* 8.8*  MG  --  1.9   Liver Function Tests: No results for input(s): AST, ALT, ALKPHOS, BILITOT, PROT, ALBUMIN in the last 72 hours. No results for input(s): LIPASE, AMYLASE in the last 72 hours. CBC: Recent Labs    09/10/18 0419 09/10/18 1046  WBC 6.5 6.0  HGB 13.9 14.2  HCT 41.0 42.1  MCV 98.4 97.0  PLT 234 242   Cardiac Enzymes: Recent Labs    09/09/18 2235 09/10/18 0419 09/10/18 1010  TROPONINI 0.04* 0.05* 0.04*   BNP: Invalid input(s): POCBNP D-Dimer: No results for input(s): DDIMER in the last 72 hours. Hemoglobin A1C: No results for input(s): HGBA1C in the last 72 hours. Fasting Lipid Panel: Recent Labs    09/10/18 0419  CHOL 202*  HDL 75  LDLCALC 112*  TRIG 73  CHOLHDL 2.7   Thyroid Function Tests: No results for input(s): TSH, T4TOTAL, T3FREE, THYROIDAB in the last 72 hours.  Invalid input(s): FREET3 Anemia Panel: No results for input(s): VITAMINB12, FOLATE, FERRITIN, TIBC, IRON, RETICCTPCT in the last 72 hours.   PHYSICAL EXAM General: Well developed, well nourished, in no acute distress HEENT:  Normocephalic and atramatic Neck:  No JVD.  Lungs: Clear bilaterally to auscultation and percussion. Heart: HRRR . Normal S1 and S2 without gallops or murmurs.  Abdomen: Bowel sounds  are positive, abdomen soft and non-tender , no hematoma or bruits. Msk:  Back normal, normal gait. Normal strength and tone for age. Extremities: No clubbing, cyanosis or edema.   Neuro: Alert and oriented X 3. Psych:  Good affect, responds appropriately  TELEMETRY: NSR 62bpm  ASSESSMENT AND PLAN: Cardiac cath normal, normal coronaries. Echo shows normal LVEF. Cath site healing well. Advise discharge with outpatient follow up with Dr. Welton Flakes for CVD risk factor management.   Active Problems:   Chest pain    Caroleen Hamman, NP-C 09/11/2018 9:14 AM Cell: [phone]

## 2018-09-20 NOTE — Discharge Summary (Signed)
[facility] - [place] at [facility]   PATIENT NAME: Andrew Sparks    MR#:  [id number]  DATE OF BIRTH:  [birth year]  DATE OF ADMISSION:  09/09/2018 ADMITTING PHYSICIAN: Ihor Austin, MD  DATE OF DISCHARGE: 09/11/2018  1:19 PM  PRIMARY CARE PHYSICIAN: [name], MD   ADMISSION DIAGNOSIS:  Syncope, unspecified syncope type [R55] Chest pain, unspecified type [R07.9] Hypertension, unspecified type [I10]  DISCHARGE DIAGNOSIS:  Active Problems:   Chest pain   SECONDARY DIAGNOSIS:   Past Medical History:  Diagnosis Date  . Anxiety   . CHF (congestive heart failure) (HCC)   . Depression   . Hypersomnia with sleep apnea   . Hypertension   . Morbid obesity (HCC)   . NSTEMI (non-ST elevated myocardial infarction) (HCC) 2005  . Obesity hypoventilation syndrome (HCC)   . Shortness of breath dyspnea   . Sleep apnea   . Stroke (HCC)   . V-tach [facility])      ADMITTING HISTORY  HISTORY OF PRESENT ILLNESS: Andrew Sparks  is a 52 y.o. male with a known history of congestive heart failure, hypertension, myocardial infarction in the past, sleep apnea, stroke, ventricular tachycardia presented to the emergency room for chest pain.  Patient got up from the stool felt dizzy was about to pass out at home.  He presented to the emergency room with above complaints.  The pain in the chest is located in the left side of the chest sharp in nature 4 out of 10 on a scale of 1-10.  He was evaluated in the emergency room first set of troponin 0.05.  Hospitalist service was consulted for further care.  HOSPITAL COURSE:   Andrew Sparks a 52 y.o.malewith a history of hypertension although he has been off of his 5 or 6 medications because he forgets to take them, but he has them in his bag with him, presents today for episode of syncope prior to arrival. States he felt lightheaded and nauseated and then started to fall to the ground but did not get injured  * Chest pain  with mild elevated troponin Cardiology consult with Dr. Park Breed .  Cardiac catheterization showed nonocclusive CAD Start patient on aspirin and PRN nitrates  * Syncope Likely vagal.  Seen by cardiology.  No arrhythmias on telemetry.  Outpatient follow-up.  * Chronic congestive heart failure Medical management.  No signs of fluid overload  * DVT prophylaxis subcu Lovenox daily  * Uncontrolled hypertension Optimized blood pressure medications d/with patient's regarding compliance for medications.   * Hypokalemia Replaced  * Tobacco abuse Tobacco cessation counseled  Stable for discharge home  CONSULTS OBTAINED:  Treatment Team:  Laurier Nancy, MD  DRUG ALLERGIES:  No Known Allergies  DISCHARGE MEDICATIONS:   Allergies as of 09/11/2018   No Known Allergies     Medication List    TAKE these medications   albuterol 108 (90 Base) MCG/ACT inhaler Commonly known as:  PROVENTIL HFA;VENTOLIN HFA Inhale 2 puffs into the lungs every 6 (six) hours as needed for wheezing or shortness of breath.   ALPRAZolam 0.25 MG tablet Commonly known as:  XANAX Take 1 tablet (0.25 mg total) by mouth daily as needed for anxiety (panic attack).   amiodarone 200 MG tablet Commonly known as:  PACERONE Take 1 tablet (200 mg total) by mouth daily.   aspirin EC 81 MG tablet Take 1 tablet (81 mg total) by mouth daily.   cloNIDine 0.1 MG tablet Commonly known as:  CATAPRES Take 1 tablet (0.1 mg total) by mouth 3 (three) times daily. What changed:  how much to take   FLUoxetine 20 MG capsule Commonly known as:  PROZAC Take 1 capsule (20 mg total) by mouth daily.   furosemide 40 MG tablet Commonly known as:  LASIX Take 1 tablet (40 mg total) by mouth daily.   hydrALAZINE 100 MG tablet Commonly known as:  APRESOLINE Take 1 tablet (100 mg total) by mouth 3 (three) times daily.   hydrochlorothiazide 25 MG tablet Commonly known as:  HYDRODIURIL Take 25 mg by mouth daily.    lisinopril 10 MG tablet Commonly known as:  PRINIVIL,ZESTRIL Take 1 tablet (10 mg total) by mouth daily. What changed:  how much to take   metoprolol tartrate 25 MG tablet Commonly known as:  LOPRESSOR Take 1 tablet (25 mg total) by mouth 2 (two) times daily.       Today   VITAL SIGNS:  Blood pressure (!) 117/94, pulse (!) 55, temperature 97.7 F (36.5 C), temperature source Oral, resp. rate 18, height 5\' 11"  (1.803 m), weight 113.2 kg, SpO2 95 %.  I/O:  No intake or output data in the 24 hours ending 09/20/18 1421  PHYSICAL EXAMINATION:  Physical Exam  GENERAL:  52 y.o.-year-old patient lying in the bed with no acute distress.  LUNGS: Normal breath sounds bilaterally, no wheezing, rales,rhonchi or crepitation. No use of accessory muscles of respiration.  CARDIOVASCULAR: S1, S2 normal. No murmurs, rubs, or gallops.  ABDOMEN: Soft, non-tender, non-distended. Bowel sounds present. No organomegaly or mass.  NEUROLOGIC: Moves all 4 extremities. PSYCHIATRIC: The patient is alert and oriented x 3.  SKIN: No obvious rash, lesion, or ulcer.   DATA REVIEW:   CBC No results for input(s): WBC, HGB, HCT, PLT in the last 168 hours.  Chemistries  No results for input(s): NA, K, CL, CO2, GLUCOSE, BUN, CREATININE, CALCIUM, MG, AST, ALT, ALKPHOS, BILITOT in the last 168 hours.  Invalid input(s): GFRCGP  Cardiac Enzymes No results for input(s): TROPONINI in the last 168 hours.  Microbiology Results  No results found for this or any previous visit.  RADIOLOGY:  No results found.  Follow up with PCP in 1 week.  Management plans discussed with the patient, family and they are in agreement.  CODE STATUS:  Code Status History    Date Active Date Inactive Code Status Order ID Comments User Context   09/09/2018 2223 09/11/2018 1624 Full Code [id number]  Ihor Austin, MD Inpatient   03/26/2018 1849 03/29/2018 1950 Full Code [id number]  Shaune Pollack, MD Inpatient   07/17/2017 1524  07/19/2017 1742 Full Code [id number]  Ihor Austin, MD Inpatient   01/17/2017 0827 01/18/2017 1909 Full Code [id number]  Arnaldo Natal, MD Inpatient   12/15/2014 1547 12/23/2014 1443 Full Code [id number]  Love, Evlyn Kanner, PA-C Inpatient    Advance Directive Documentation     Most Recent Value  Type of Advance Directive  Healthcare Power of Attorney, Living will  Pre-existing out of facility DNR order (yellow form or pink MOST form)  -  "MOST" Form in Place?  -      TOTAL TIME TAKING CARE OF THIS PATIENT ON DAY OF DISCHARGE: more than 30 minutes.   Molinda Bailiff Licet Dunphy M.D on 09/20/2018 at 2:21 PM  Between 7am to 6pm - Pager - [phone]  After 6pm go to [email or web address] - password [email or web address] [facility]  [facility] [place] [facility]  Office  [phone]  CC: Primary care physician; [name],  MD  Note: This dictation was prepared with Dragon dictation along with smaller phrase technology. Any transcriptional errors that result from this process are unintentional.

## 2019-03-11 ENCOUNTER — Emergency Department: Payer: Medicare Other

## 2019-03-11 ENCOUNTER — Emergency Department
Admission: EM | Admit: 2019-03-11 | Discharge: 2019-03-11 | Disposition: A | Discharge status: Home / Self Care | Payer: Medicare Other | Attending: Emergency Medicine | Admitting: Emergency Medicine

## 2019-03-11 ENCOUNTER — Other Ambulatory Visit: Payer: Self-pay

## 2019-03-11 ENCOUNTER — Encounter: Payer: Self-pay | Admitting: Emergency Medicine

## 2019-03-11 DIAGNOSIS — F410 Panic disorder [episodic paroxysmal anxiety] without agoraphobia: Secondary | ICD-10-CM | POA: Diagnosis not present

## 2019-03-11 DIAGNOSIS — I110 Hypertensive heart disease with heart failure: Secondary | ICD-10-CM | POA: Insufficient documentation

## 2019-03-11 DIAGNOSIS — I2510 Atherosclerotic heart disease of native coronary artery without angina pectoris: Secondary | ICD-10-CM | POA: Diagnosis not present

## 2019-03-11 DIAGNOSIS — I509 Heart failure, unspecified: Secondary | ICD-10-CM | POA: Insufficient documentation

## 2019-03-11 DIAGNOSIS — R7989 Other specified abnormal findings of blood chemistry: Secondary | ICD-10-CM | POA: Diagnosis not present

## 2019-03-11 DIAGNOSIS — I4891 Unspecified atrial fibrillation: Secondary | ICD-10-CM | POA: Diagnosis not present

## 2019-03-11 DIAGNOSIS — R778 Other specified abnormalities of plasma proteins: Secondary | ICD-10-CM

## 2019-03-11 DIAGNOSIS — R0602 Shortness of breath: Secondary | ICD-10-CM | POA: Diagnosis present

## 2019-03-11 DIAGNOSIS — F17210 Nicotine dependence, cigarettes, uncomplicated: Secondary | ICD-10-CM | POA: Insufficient documentation

## 2019-03-11 LAB — COMPREHENSIVE METABOLIC PANEL
ALT: 30 U/L (ref 0–44)
AST: 43 U/L — ABNORMAL HIGH (ref 15–41)
Albumin: 3.7 g/dL (ref 3.5–5.0)
Alkaline Phosphatase: 95 U/L (ref 38–126)
Anion gap: 9 (ref 5–15)
BUN: 11 mg/dL (ref 6–20)
CO2: 23 mmol/L (ref 22–32)
Calcium: 8.6 mg/dL — ABNORMAL LOW (ref 8.9–10.3)
Chloride: 104 mmol/L (ref 98–111)
Creatinine, Ser: 0.97 mg/dL (ref 0.61–1.24)
GFR calc Af Amer: >60 mL/min (ref >60)
GFR calc non Af Amer: >60 mL/min (ref >60)
Glucose, Bld: 123 mg/dL — ABNORMAL HIGH (ref 70–99)
Potassium: 4.5 mmol/L (ref 3.5–5.1)
Sodium: 136 mmol/L (ref 135–145)
Total Bilirubin: 1.1 mg/dL (ref 0.3–1.2)
Total Protein: 7.6 g/dL (ref 6.5–8.1)

## 2019-03-11 LAB — CBC WITH DIFFERENTIAL/PLATELET
Abs Immature Granulocytes: 0.02 10*3/uL (ref 0.00–0.07)
Basophils Absolute: 0 10*3/uL (ref 0.0–0.1)
Basophils Relative: 1 %
Eosinophils Absolute: 0.4 10*3/uL (ref 0.0–0.5)
Eosinophils Relative: 7 %
HCT: 43.2 % (ref 39.0–52.0)
Hemoglobin: 14.6 g/dL (ref 13.0–17.0)
Immature Granulocytes: 0 %
Lymphocytes Relative: 22 %
Lymphs Abs: 1.2 10*3/uL (ref 0.7–4.0)
MCH: 32.8 pg (ref 26.0–34.0)
MCHC: 33.8 g/dL (ref 30.0–36.0)
MCV: 97.1 fL (ref 80.0–100.0)
Monocytes Absolute: 0.7 10*3/uL (ref 0.1–1.0)
Monocytes Relative: 12 %
Neutro Abs: 3.3 10*3/uL (ref 1.7–7.7)
Neutrophils Relative %: 58 %
Platelets: 206 10*3/uL (ref 150–400)
RBC: 4.45 MIL/uL (ref 4.22–5.81)
RDW: 13.5 % (ref 11.5–15.5)
WBC: 5.7 10*3/uL (ref 4.0–10.5)
nRBC: 0 % (ref 0.0–0.2)

## 2019-03-11 LAB — TROPONIN I
Troponin I: 0.06 ng/mL (ref <0.03)
Troponin I: 0.05 ng/mL (ref <0.03)

## 2019-03-11 LAB — BRAIN NATRIURETIC PEPTIDE: B Natriuretic Peptide: 1006 pg/mL — ABNORMAL HIGH (ref 0.0–100.0)

## 2019-03-11 MED ORDER — ASPIRIN 81 MG PO CHEW
324.0000 mg | CHEWABLE_TABLET | Freq: Once | ORAL | Status: AC
  Administered 2019-03-11: 18:00:00 324 mg via ORAL
  Filled 2019-03-11: qty 4

## 2019-03-11 MED ORDER — DILTIAZEM HCL 25 MG/5ML IV SOLN
INTRAVENOUS | Status: AC
  Filled 2019-03-11: qty 5

## 2019-03-11 MED ORDER — LORAZEPAM 2 MG/ML IJ SOLN
INTRAMUSCULAR | Status: AC
  Filled 2019-03-11: qty 1

## 2019-03-11 MED ORDER — DEXTROSE 5 % IV SOLN: 250.0000 mg | Freq: Once | INTRAVENOUS | Status: DC

## 2019-03-11 MED ORDER — AMIODARONE LOAD VIA INFUSION
250.0000 mg | Freq: Once | INTRAVENOUS | Status: AC
  Administered 2019-03-11: 250 mg via INTRAVENOUS
  Filled 2019-03-11 (×2): qty 138.89

## 2019-03-11 MED ORDER — DILTIAZEM HCL 25 MG/5ML IV SOLN
15.0000 mg | Freq: Once | INTRAVENOUS | Status: DC
  Filled 2019-03-11: qty 5

## 2019-03-11 MED ORDER — METOPROLOL TARTRATE 5 MG/5ML IV SOLN
10.0000 mg | Freq: Once | INTRAVENOUS | Status: AC
  Administered 2019-03-11: 17:00:00 10 mg via INTRAVENOUS
  Filled 2019-03-11: qty 10

## 2019-03-11 MED ORDER — DEXTROSE 5 % IV SOLN
250.0000 mg | Freq: Once | INTRAVENOUS | Status: DC
  Filled 2019-03-11: qty 5

## 2019-03-11 MED ORDER — RIVAROXABAN 20 MG PO TABS: 20.0000 mg | ORAL_TABLET | Freq: Every day | ORAL | 0 refills | Status: DC

## 2019-03-11 MED ORDER — RIVAROXABAN 20 MG PO TABS
20.0000 mg | ORAL_TABLET | Freq: Once | ORAL | Status: AC
  Administered 2019-03-11: 20 mg via ORAL
  Filled 2019-03-11 (×2): qty 1

## 2019-03-11 MED ORDER — AMIODARONE HCL 200 MG PO TABS: 400.0000 mg | ORAL_TABLET | Freq: Two times a day (BID) | ORAL | 0 refills | Status: DC

## 2019-03-11 MED ORDER — LORAZEPAM 2 MG/ML IJ SOLN
1.0000 mg | Freq: Once | INTRAMUSCULAR | Status: AC
  Administered 2019-03-11: 17:00:00 1 mg via INTRAMUSCULAR

## 2019-03-11 MED ORDER — AMIODARONE HCL 200 MG PO TABS
400.0000 mg | ORAL_TABLET | Freq: Every day | ORAL | Status: DC
  Administered 2019-03-11: 20:00:00 400 mg via ORAL
  Filled 2019-03-11 (×2): qty 2

## 2019-03-11 NOTE — ED Notes (Signed)
Date and time results received: 03/11/19 5:54 PM   Test: Troponin Critical Value: 0.06  Name of Provider Notified: norman

## 2019-03-11 NOTE — Discharge Instructions (Addendum)
You need to restart your amiodarone, to control your atrial fibrillation, as well as Xarelto, to decrease your chance of stroke.  You will need to have a full examination and restart all the medications you need for all your medical problems - so it is VERY important that you see your primary care doctor in the next 1-2 days for this.  You will also need to see Dr. Welton Flakes, your cardiologist, for continued evaluation of your heart.  He has set an appointment for you at 10am on Thursday.  Return to the emergency department for shortness of breath, chest pain, severe pain, lightheadedness or fainting, or for any other symptoms concerning to you.

## 2019-03-11 NOTE — ED Provider Notes (Signed)
[facility] Emergency Department Provider Note  ____________________________________________  Time seen: Approximately 4:56 PM  I have reviewed the triage vital signs and the nursing notes.   HISTORY  Chief Complaint Shortness of Breath    HPI Hence Derrick is a 53 y.o. male history of HTN, CAD status post N STEMI, V. tach, CHF, anxiety, who has not been taking any of his medications for over a month, presenting with panic attack and shortness of breath.  The patient reports that yesterday while at rest he began to feel short of breath and it has gotten progressively worse.  He reports that he feels like he is having a panic attack, which is typical for him.  He is not having any chest pain, palpitations, lightheadedness or syncope.  He has not had any cough or cold symptoms, fever or chills, sore throat, congestion or rhinorrhea, or known sick contacts.  Past Medical History:  Diagnosis Date  . Anxiety   . CHF (congestive heart failure) (HCC)   . Depression   . Hypersomnia with sleep apnea   . Hypertension   . Morbid obesity (HCC)   . NSTEMI (non-ST elevated myocardial infarction) (HCC) 2005  . Obesity hypoventilation syndrome (HCC)   . Shortness of breath dyspnea   . Sleep apnea   . Stroke (HCC)   . V-tach [facility])     Patient Active Problem List   Diagnosis Date Noted  . ARF (acute renal failure) (HCC) 03/26/2018  . CHF (congestive heart failure) (HCC) 07/17/2017  . 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 Panic disorder 01/18/2017  . Chest pain 01/17/2017  . Sleep apnea, obstructive 02/01/2016  . Tracheostomy care (HCC)   . 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 Debility 12/16/2014  . Obesity hypoventilation syndrome (HCC) 12/16/2014  . Encephalopathy pulmonary 12/15/2014    Past Surgical History:  Procedure Laterality Date  . LEFT HEART CATH AND CORONARY ANGIOGRAPHY  Right 09/10/2018   Procedure: LEFT HEART CATH AND CORONARY ANGIOGRAPHY with possible percutaneous intervention;  Surgeon: Laurier Nancy, MD;  Location: [facility] INVASIVE CV LAB;  Service: Cardiovascular;  Laterality: Right;  . TRACHEOSTOMY      Current Outpatient Rx  . Order #: [id number] Class: Print  . Order #: [id number] Class: Print  . Order #: [id number] Class: Print  . Order #: [id number] Class: No Print  . Order #: [id number] Class: Print  . Order #: [id number] Class: Print  . Order #: [id number] Class: Print  . Order #: [id number] Class: Print  . Order #: [id number] Class: Historical Med  . Order #: [id number] Class: Print  . Order #: [id number] Class: Print    Allergies Patient has no known allergies.  Family History  Problem Relation Age of Onset  . Heart disease Father   . Diabetes type II Mother   . Breast cancer Mother     Social History Social History   Tobacco Use  . Smoking status: Current Every Day Smoker    Packs/day: 0.50    Years: 28.00    Pack years: 14.00    Types: Cigarettes  . Smokeless tobacco: Never Used  Substance Use Topics  . Alcohol use: Yes    Alcohol/week: 8.0 standard drinks    Types: 8 Cans of beer per week  . Drug use: No    Review of Systems Constitutional: No fever/chills.  No lightheadedness or syncope. Eyes: No visual changes. ENT: No sore throat. No congestion or rhinorrhea. Cardiovascular: Denies chest pain. Denies palpitations. Respiratory:  Positive shortness of breath.  No cough. Gastrointestinal: No abdominal pain.  No nausea, no vomiting.  No diarrhea.  No constipation. Genitourinary: Negative for dysuria. Musculoskeletal: Negative for back pain. Skin: Negative for rash. Neurological: Negative for headaches. No focal numbness, tingling or weakness.  Psychiatric:Positive panic attack.  ____________________________________________   PHYSICAL EXAM:  VITAL SIGNS: ED Triage Vitals  Enc Vitals Group     BP 03/11/19 1634 (!) 174/147      Pulse Rate 03/11/19 1634 (!) 145     Resp 03/11/19 1634 (!) 28     Temp 03/11/19 1634 97.6 F (36.4 C)     Temp Source 03/11/19 1634 Oral     SpO2 03/11/19 1634 98 %     Weight 03/11/19 1632 260 lb (117.9 kg)     Height 03/11/19 1632 5\' 11"  (1.803 m)     Head Circumference --      Peak Flow --      Pain Score 03/11/19 1632 0     Pain Loc --      Pain Edu? --      Excl. in GC? --     Constitutional: Alert and oriented. Answers questions appropriately.  Anxious appearing, sitting on the edge of the bed, mildly diaphoretic.  Refusing to put his mask on because he feels like he cannot breathe with it on. Eyes: Conjunctivae are normal.  EOMI. No scleral icterus. Head: Atraumatic. Nose: No congestion/rhinnorhea. Mouth/Throat: Mucous membranes are moist.  Neck: No stridor.  Supple.  No JVD. Cardiovascular: Fast rate, irregular rhythm. No murmurs, rubs or gallops.  Respiratory: Tachypneic without significant accessory muscle use or retractions.  The patient's O2 sats are 98% on room air.  He has no wheezes rales or rhonchi. Gastrointestinal: Overweight.  Soft, nontender and nondistended.  Easily reducible umbilical hernia.  No guarding or rebound.  No peritoneal signs. Musculoskeletal: No LE edema.  No palpable cords in the calves or Homans sign. Neurologic:  A&Ox3.  Speech is clear.  Face and smile are symmetric.  EOMI.  Moves all extremities well. Skin:  Skin is warm, dry and intact. No rash noted. Psychiatric: Very anxious mood and and affect with pressured speech.  ____________________________________________   LABS (all labs ordered are listed, but only abnormal results are displayed)  Labs Reviewed  COMPREHENSIVE METABOLIC PANEL - Abnormal; Notable for the following components:      Result Value   Glucose, Bld 123 (*)    Calcium 8.6 (*)    AST 43 (*)    All other components within normal limits  TROPONIN I - Abnormal; Notable for the following components:   Troponin I 0.06  (*)    All other components within normal limits  BRAIN NATRIURETIC PEPTIDE - Abnormal; Notable for the following components:   B Natriuretic Peptide 1,006.0 (*)    All other components within normal limits  CBC WITH DIFFERENTIAL/PLATELET  TROPONIN I   ____________________________________________  EKG  ED ECG REPORT I, Anne-Caroline Sharma CovertNorman, the attending physician, personally viewed and interpreted this ECG.   Date: 03/11/2019  EKG Time: 1626  Rate: 147  Rhythm: The computer refers to this as atrial fibrillation with RVR, but I am not convinced that this is not a sinus arrhythmia with some PVCs; we will still him down to see what his underlying rhythm is.  Axis: leftward  Intervals:prolonged Qtc  ST&T Change: No STEMI  ____________________________________________  RADIOLOGY  Dg Chest Portable 1 View  Result Date: 03/11/2019 CLINICAL DATA:  Shortness  of breath EXAM: PORTABLE CHEST 1 VIEW COMPARISON:  September 09, 2018 FINDINGS: There is stable cardiomegaly with pulmonary vascularity within normal limits. No edema or consolidation. No adenopathy. No bone lesions. IMPRESSION: Stable cardiomegaly.  No edema or consolidation. Electronically Signed   By: Bretta Bang III M.D.   On: 03/11/2019 17:41    ____________________________________________   PROCEDURES  Procedure(s) performed: None  Procedures  Critical Care performed: Yes, see critical care note(s) ____________________________________________   INITIAL IMPRESSION / ASSESSMENT AND PLAN / ED COURSE  Pertinent labs & imaging results that were available during my care of the patient were reviewed by me and considered in my medical decision making (see chart for details).  53 y.o. male with multiple cardiac and noncardiac comorbidities presenting off of his medication for month for shortness of breath and panic attack.  Overall, the patient is significantly hypertensive and tachycardic.  His tachycardia may be a sinus  rhythm or sinus arrhythmia with PVCs, but atrial fibrillation, which he has never been diagnosed with in the past, is also in the differential.  We will repeat a chest EKG once his heart rate has slowed down.  He will receive metoprolol.  We will also do a cardiac work-up including chest x-ray, troponin and BNP.  The patient is not having any obvious infectious symptoms today, but coronavirus is on the differential given the ongoing pandemic.  Plan reevaluation for final disposition.  ----------------------------------------- 7:44 PM on 03/11/2019 -----------------------------------------  Reviewed the patient's chart, and he was admitted 10/19 and underwent cardiac catheterization that showed normal coronaries at that time.  He has a long history of being noncompliant with his medications, including his antihypertensives as well as his Xarelto.  I have spoken with his cardiologist, Dr. Adrian Blackwater.  Here, the patient has been hypertensive and tachycardic but has responded well to medical metoprolol.  His troponin is 0.06, and his baseline is usually 0.04-0.05.  This is not a significant change but will get a second troponin for trend.  His BNP is 1000, but he does not have a significant amount of peripheral edema nor pulmonary edema on his chest x-ray.  I have had a long conversation with Dr. Park Breed about the risks and benefits of staying at the hospital during the pandemic.  He feels strongly that with normal coronaries, it is unlikely this patient is having an acute MI.  He likely has decompensation due to his noncompliance.  Dr. Park Breed strongly encourages me to discharge this patient, with amiodarone and Xarelto.  Here, the patient will receive an amiodarone IV bolus, followed by an oral tablet and his first dose of Xarelto.  He will be seen by Dr. Park Breed in the office on Thursday at 10 AM.  I have given him specific follow-up instructions and return precautions.  The patient is anxious to be discharged as well  and does not want to be hospitalized.  Tracker Mance was evaluated in Emergency Department on 03/11/2019 for the symptoms described in the history of present illness. He was evaluated in the context of the global COVID-19 pandemic, which necessitated consideration that the patient might be at risk for infection with the SARS-CoV-2 virus that causes COVID-19. Institutional protocols and algorithms that pertain to the evaluation of patients at risk for COVID-19 are in a state of rapid change based on information released by regulatory bodies including the CDC and federal and state organizations. These policies and algorithms were followed during the patient's care in the ED.  CRITICAL CARE Performed by: Rockne Menghini   Total critical care time: 35  minutes  Critical care time was exclusive of separately billable procedures and treating other patients.  Critical care was necessary to treat or prevent imminent or life-threatening deterioration.  Critical care was time spent personally by me on the following activities: development of treatment plan with patient and/or surrogate as well as nursing, discussions with consultants, evaluation of patient's response to treatment, examination of patient, obtaining history from patient or surrogate, ordering and performing treatments and interventions, ordering and review of laboratory studies, ordering and review of radiographic studies, pulse oximetry and re-evaluation of patient's condition.   ____________________________________________  FINAL CLINICAL IMPRESSION(S) / ED DIAGNOSES  Final diagnoses:  Atrial fibrillation with RVR (HCC)  Elevated brain natriuretic peptide (BNP) level  Elevated troponin  Panic attack         NEW MEDICATIONS STARTED DURING THIS VISIT:  New Prescriptions   No medications on file      Rockne Menghini, MD 03/11/19 1952

## 2019-03-11 NOTE — ED Triage Notes (Signed)
Here for [facility] that started yesterday. No pain. Pt slightly labored at check in with mild tachypnea. Once in room patient began having anxiety attack and not wanting to wear mask. Sitting on side of bed having difficulty calming down. Has not been taking bp or anxiety meds. No cough. No fever

## 2019-04-20 ENCOUNTER — Other Ambulatory Visit: Payer: Self-pay

## 2019-04-20 ENCOUNTER — Encounter (HOSPITAL_COMMUNITY): Payer: Self-pay | Admitting: *Deleted

## 2019-04-20 ENCOUNTER — Inpatient Hospital Stay (HOSPITAL_COMMUNITY)
Admission: EM | Admit: 2019-04-20 | Discharge: 2019-04-24 | DRG: 062 | Disposition: A | Discharge status: Home Health | Payer: Medicare Other | Attending: Neurology | Admitting: Neurology

## 2019-04-20 ENCOUNTER — Emergency Department (HOSPITAL_COMMUNITY): Payer: Medicare Other

## 2019-04-20 DIAGNOSIS — I63411 Cerebral infarction due to embolism of right middle cerebral artery: Secondary | ICD-10-CM | POA: Diagnosis not present

## 2019-04-20 DIAGNOSIS — I4821 Permanent atrial fibrillation: Secondary | ICD-10-CM | POA: Diagnosis not present

## 2019-04-20 DIAGNOSIS — I252 Old myocardial infarction: Secondary | ICD-10-CM | POA: Diagnosis not present

## 2019-04-20 DIAGNOSIS — I639 Cerebral infarction, unspecified: Principal | ICD-10-CM | POA: Diagnosis present

## 2019-04-20 DIAGNOSIS — R29702 NIHSS score 2: Secondary | ICD-10-CM | POA: Diagnosis present

## 2019-04-20 DIAGNOSIS — R945 Abnormal results of liver function studies: Secondary | ICD-10-CM | POA: Diagnosis not present

## 2019-04-20 DIAGNOSIS — I4891 Unspecified atrial fibrillation: Secondary | ICD-10-CM | POA: Diagnosis not present

## 2019-04-20 DIAGNOSIS — E662 Morbid (severe) obesity with alveolar hypoventilation: Secondary | ICD-10-CM | POA: Diagnosis present

## 2019-04-20 DIAGNOSIS — I509 Heart failure, unspecified: Secondary | ICD-10-CM | POA: Diagnosis present

## 2019-04-20 DIAGNOSIS — I63311 Cerebral infarction due to thrombosis of right middle cerebral artery: Secondary | ICD-10-CM | POA: Diagnosis not present

## 2019-04-20 DIAGNOSIS — I161 Hypertensive emergency: Secondary | ICD-10-CM | POA: Diagnosis present

## 2019-04-20 DIAGNOSIS — Z6837 Body mass index (BMI) 37.0-37.9, adult: Secondary | ICD-10-CM | POA: Diagnosis not present

## 2019-04-20 DIAGNOSIS — I6389 Other cerebral infarction: Secondary | ICD-10-CM | POA: Diagnosis not present

## 2019-04-20 DIAGNOSIS — I454 Nonspecific intraventricular block: Secondary | ICD-10-CM | POA: Diagnosis present

## 2019-04-20 DIAGNOSIS — Z1159 Encounter for screening for other viral diseases: Secondary | ICD-10-CM

## 2019-04-20 DIAGNOSIS — E785 Hyperlipidemia, unspecified: Secondary | ICD-10-CM | POA: Diagnosis not present

## 2019-04-20 DIAGNOSIS — Z8673 Personal history of transient ischemic attack (TIA), and cerebral infarction without residual deficits: Secondary | ICD-10-CM | POA: Diagnosis not present

## 2019-04-20 DIAGNOSIS — I739 Peripheral vascular disease, unspecified: Secondary | ICD-10-CM | POA: Diagnosis present

## 2019-04-20 DIAGNOSIS — I4819 Other persistent atrial fibrillation: Secondary | ICD-10-CM | POA: Diagnosis present

## 2019-04-20 DIAGNOSIS — N179 Acute kidney failure, unspecified: Secondary | ICD-10-CM | POA: Diagnosis not present

## 2019-04-20 DIAGNOSIS — J9610 Chronic respiratory failure, unspecified whether with hypoxia or hypercapnia: Secondary | ICD-10-CM | POA: Diagnosis present

## 2019-04-20 DIAGNOSIS — Z803 Family history of malignant neoplasm of breast: Secondary | ICD-10-CM | POA: Diagnosis not present

## 2019-04-20 DIAGNOSIS — Z7982 Long term (current) use of aspirin: Secondary | ICD-10-CM | POA: Diagnosis not present

## 2019-04-20 DIAGNOSIS — R7989 Other specified abnormal findings of blood chemistry: Secondary | ICD-10-CM | POA: Diagnosis not present

## 2019-04-20 DIAGNOSIS — I2510 Atherosclerotic heart disease of native coronary artery without angina pectoris: Secondary | ICD-10-CM | POA: Diagnosis present

## 2019-04-20 DIAGNOSIS — Z833 Family history of diabetes mellitus: Secondary | ICD-10-CM

## 2019-04-20 DIAGNOSIS — I480 Paroxysmal atrial fibrillation: Secondary | ICD-10-CM | POA: Diagnosis not present

## 2019-04-20 DIAGNOSIS — F419 Anxiety disorder, unspecified: Secondary | ICD-10-CM | POA: Diagnosis present

## 2019-04-20 DIAGNOSIS — R29810 Facial weakness: Secondary | ICD-10-CM | POA: Diagnosis present

## 2019-04-20 DIAGNOSIS — R4781 Slurred speech: Secondary | ICD-10-CM | POA: Diagnosis present

## 2019-04-20 DIAGNOSIS — I110 Hypertensive heart disease with heart failure: Secondary | ICD-10-CM | POA: Diagnosis present

## 2019-04-20 DIAGNOSIS — Z8249 Family history of ischemic heart disease and other diseases of the circulatory system: Secondary | ICD-10-CM | POA: Diagnosis not present

## 2019-04-20 DIAGNOSIS — F1010 Alcohol abuse, uncomplicated: Secondary | ICD-10-CM | POA: Diagnosis present

## 2019-04-20 DIAGNOSIS — F17210 Nicotine dependence, cigarettes, uncomplicated: Secondary | ICD-10-CM | POA: Diagnosis present

## 2019-04-20 DIAGNOSIS — E871 Hypo-osmolality and hyponatremia: Secondary | ICD-10-CM | POA: Diagnosis not present

## 2019-04-20 DIAGNOSIS — F17200 Nicotine dependence, unspecified, uncomplicated: Secondary | ICD-10-CM | POA: Diagnosis not present

## 2019-04-20 HISTORY — DX: Unspecified atrial fibrillation: I48.91

## 2019-04-20 LAB — PROTIME-INR
INR: 1.1 (ref 0.8–1.2)
Prothrombin Time: 14.4 seconds (ref 11.4–15.2)

## 2019-04-20 LAB — I-STAT CHEM 8, ED
BUN: 14 mg/dL (ref 6–20)
Calcium, Ion: 0.99 mmol/L — ABNORMAL LOW (ref 1.15–1.40)
Chloride: 93 mmol/L — ABNORMAL LOW (ref 98–111)
Creatinine, Ser: 1.3 mg/dL — ABNORMAL HIGH (ref 0.61–1.24)
Glucose, Bld: 129 mg/dL — ABNORMAL HIGH (ref 70–99)
HCT: 53 % — ABNORMAL HIGH (ref 39.0–52.0)
Hemoglobin: 18 g/dL — ABNORMAL HIGH (ref 13.0–17.0)
Potassium: 3.9 mmol/L (ref 3.5–5.1)
Sodium: 127 mmol/L — ABNORMAL LOW (ref 135–145)
TCO2: 21 mmol/L — ABNORMAL LOW (ref 22–32)

## 2019-04-20 LAB — CBC
HCT: 48.3 % (ref 39.0–52.0)
Hemoglobin: 16.4 g/dL (ref 13.0–17.0)
MCH: 33.1 pg (ref 26.0–34.0)
MCHC: 34 g/dL (ref 30.0–36.0)
MCV: 97.4 fL (ref 80.0–100.0)
Platelets: 195 10*3/uL (ref 150–400)
RBC: 4.96 MIL/uL (ref 4.22–5.81)
RDW: 14.9 % (ref 11.5–15.5)
WBC: 7.1 10*3/uL (ref 4.0–10.5)
nRBC: 0 % (ref 0.0–0.2)

## 2019-04-20 LAB — POCT I-STAT 7, (LYTES, BLD GAS, ICA,H+H)
Acid-base deficit: 2 mmol/L (ref 0.0–2.0)
Bicarbonate: 23 mmol/L (ref 20.0–28.0)
Calcium, Ion: 1.11 mmol/L — ABNORMAL LOW (ref 1.15–1.40)
HCT: 50 % (ref 39.0–52.0)
Hemoglobin: 17 g/dL (ref 13.0–17.0)
O2 Saturation: 98 %
Patient temperature: 98.6
Potassium: 3.7 mmol/L (ref 3.5–5.1)
Sodium: 128 mmol/L — ABNORMAL LOW (ref 135–145)
TCO2: 24 mmol/L (ref 22–32)
pCO2 arterial: 38.3 mmHg (ref 32.0–48.0)
pH, Arterial: 7.388 (ref 7.350–7.450)
pO2, Arterial: 104 mmHg (ref 83.0–108.0)

## 2019-04-20 LAB — COMPREHENSIVE METABOLIC PANEL
ALT: 71 U/L — ABNORMAL HIGH (ref 0–44)
AST: 72 U/L — ABNORMAL HIGH (ref 15–41)
Albumin: 3.6 g/dL (ref 3.5–5.0)
Alkaline Phosphatase: 87 U/L (ref 38–126)
Anion gap: 16 — ABNORMAL HIGH (ref 5–15)
BUN: 13 mg/dL (ref 6–20)
CO2: 20 mmol/L — ABNORMAL LOW (ref 22–32)
Calcium: 8.7 mg/dL — ABNORMAL LOW (ref 8.9–10.3)
Chloride: 91 mmol/L — ABNORMAL LOW (ref 98–111)
Creatinine, Ser: 1.23 mg/dL (ref 0.61–1.24)
GFR calc Af Amer: >60 mL/min (ref >60)
GFR calc non Af Amer: >60 mL/min (ref >60)
Glucose, Bld: 129 mg/dL — ABNORMAL HIGH (ref 70–99)
Potassium: 3.9 mmol/L (ref 3.5–5.1)
Sodium: 127 mmol/L — ABNORMAL LOW (ref 135–145)
Total Bilirubin: 1.7 mg/dL — ABNORMAL HIGH (ref 0.3–1.2)
Total Protein: 6.8 g/dL (ref 6.5–8.1)

## 2019-04-20 LAB — DIFFERENTIAL
Abs Immature Granulocytes: 0.06 10*3/uL (ref 0.00–0.07)
Basophils Absolute: 0 10*3/uL (ref 0.0–0.1)
Basophils Relative: 0 %
Eosinophils Absolute: 0.2 10*3/uL (ref 0.0–0.5)
Eosinophils Relative: 3 %
Immature Granulocytes: 1 %
Lymphocytes Relative: 15 %
Lymphs Abs: 1.1 10*3/uL (ref 0.7–4.0)
Monocytes Absolute: 0.9 10*3/uL (ref 0.1–1.0)
Monocytes Relative: 12 %
Neutro Abs: 4.9 10*3/uL (ref 1.7–7.7)
Neutrophils Relative %: 69 %

## 2019-04-20 LAB — CBG MONITORING, ED: Glucose-Capillary: 131 mg/dL — ABNORMAL HIGH (ref 70–99)

## 2019-04-20 LAB — APTT: aPTT: 26 seconds (ref 24–36)

## 2019-04-20 MED ORDER — ACETAMINOPHEN 650 MG RE SUPP: 650.0000 mg | RECTAL | Status: DC | PRN

## 2019-04-20 MED ORDER — ACETAMINOPHEN 325 MG PO TABS
650.0000 mg | ORAL_TABLET | ORAL | Status: DC | PRN
  Administered 2019-04-22: 650 mg via ORAL
  Filled 2019-04-20: qty 2

## 2019-04-20 MED ORDER — ALTEPLASE (STROKE) FULL DOSE INFUSION
90.0000 mg | Freq: Once | INTRAVENOUS | Status: AC
  Administered 2019-04-20: 90 mg via INTRAVENOUS
  Filled 2019-04-20: qty 100

## 2019-04-20 MED ORDER — ACETAMINOPHEN 160 MG/5ML PO SOLN: 650.0000 mg | ORAL | Status: DC | PRN

## 2019-04-20 MED ORDER — SODIUM CHLORIDE 0.9% FLUSH
3.0000 mL | Freq: Once | INTRAVENOUS | Status: DC
Start: 2019-04-20 — End: 2019-04-24

## 2019-04-20 MED ORDER — LABETALOL HCL 5 MG/ML IV SOLN
20.0000 mg | Freq: Once | INTRAVENOUS | Status: AC
  Administered 2019-04-20: 10 mg via INTRAVENOUS
  Filled 2019-04-20: qty 4

## 2019-04-20 MED ORDER — SODIUM CHLORIDE 0.9 % IV SOLN
50.0000 mL | Freq: Once | INTRAVENOUS | Status: AC
  Administered 2019-04-21: 50 mL via INTRAVENOUS

## 2019-04-20 MED ORDER — PANTOPRAZOLE SODIUM 40 MG IV SOLR
40.0000 mg | Freq: Every day | INTRAVENOUS | Status: DC
  Administered 2019-04-21 (×2): 40 mg via INTRAVENOUS
  Filled 2019-04-20 (×2): qty 40

## 2019-04-20 MED ORDER — STROKE: EARLY STAGES OF RECOVERY BOOK
Freq: Once | Status: AC
  Administered 2019-04-21: 04:00:00
  Filled 2019-04-20: qty 1

## 2019-04-20 MED ORDER — LORAZEPAM 2 MG/ML IJ SOLN
1.0000 mg | Freq: Once | INTRAMUSCULAR | Status: AC
  Administered 2019-04-20: 1 mg via INTRAVENOUS

## 2019-04-20 MED ORDER — LORAZEPAM 2 MG/ML IJ SOLN
INTRAMUSCULAR | Status: AC
  Filled 2019-04-20: qty 1

## 2019-04-20 MED ORDER — MIDAZOLAM HCL 2 MG/2ML IJ SOLN
2.0000 mg | Freq: Once | INTRAMUSCULAR | Status: AC
  Administered 2019-04-21: 2 mg via INTRAVENOUS
  Filled 2019-04-20: qty 2

## 2019-04-20 MED ORDER — SODIUM CHLORIDE 0.9 % IV SOLN: INTRAVENOUS | Status: DC

## 2019-04-20 MED ORDER — CLEVIDIPINE BUTYRATE 0.5 MG/ML IV EMUL
0.0000 mg/h | INTRAVENOUS | Status: DC
  Administered 2019-04-20 (×2): 1 mg/h via INTRAVENOUS
  Administered 2019-04-21: 06:00:00 4 mg/h via INTRAVENOUS
  Administered 2019-04-21: 18:00:00 12 mg/h via INTRAVENOUS
  Administered 2019-04-21: 8 mg/h via INTRAVENOUS
  Administered 2019-04-22: 15:00:00 5 mg/h via INTRAVENOUS
  Administered 2019-04-22: 10 mg/h via INTRAVENOUS
  Administered 2019-04-22: 02:00:00 15 mg/h via INTRAVENOUS
  Administered 2019-04-22: 13 mg/h via INTRAVENOUS
  Administered 2019-04-22 (×2): 16 mg/h via INTRAVENOUS
  Administered 2019-04-23: 4 mg/h via INTRAVENOUS
  Filled 2019-04-20 (×4): qty 50
  Filled 2019-04-20: qty 100
  Filled 2019-04-20: qty 50
  Filled 2019-04-20: qty 100
  Filled 2019-04-20 (×4): qty 50

## 2019-04-20 MED ORDER — LABETALOL HCL 5 MG/ML IV SOLN
10.0000 mg | Freq: Once | INTRAVENOUS | Status: AC
  Administered 2019-04-20: 10 mg via INTRAVENOUS

## 2019-04-20 NOTE — ED Notes (Signed)
Neuro and rapid response rn remains at bedside.

## 2019-04-20 NOTE — ED Notes (Addendum)
Pt was sat up on the side of the bed to use the urinal, his right arm was caught on the bp wires, I stepped over to the other side of the bed to clear the wire, when I did the pt rolled onto the ground. Did not hit his head, no loc. He appeared to have difficulty lifting the left arm, but denied weakness @2230  . When ambulating pt several steps, appeared to be unsteady, leaning to the left. Denies use of assistive device to ambulate at home. Noted new onset of left sided ataxia, mild drift on the left side. Dr. Rhunette Croft called to bedside. Dr. Laurence Slate to the bedside at 2238, new orders for TPA

## 2019-04-20 NOTE — ED Triage Notes (Signed)
Pt arrives via [facility]. He had onset of left facial droop and slurred speech at 2050 tonight. He took 2 nitro before EMS arrival to his home. En route, iv established x 2, pt hypertensive 207/144, hr of 79-108, new dx of afib, has not yet started blood thinners. Pt c/o sob while lying down, 2 liters [place] placed. No change in neuro status while in route. Pt does have a hx of stroke, htn and afib.

## 2019-04-20 NOTE — ED Notes (Signed)
RT at bedside, will hold off on ABG d/t TPA and cannot due waiting on COVID sample testing.

## 2019-04-20 NOTE — ED Notes (Signed)
Pt fiance is [employer]

## 2019-04-20 NOTE — Progress Notes (Signed)
PHARMACIST CODE STROKE RESPONSE  Notified to mix tPA at 2240 by Dr. Laurence Slate Delivered tPA to RN at 2245  tPA dose = 9mg  bolus over 1 minute followed by 81mg  for a total dose of 90mg  over 1 hour  Issues/delays encountered (if applicable): BP management requiring multiple labetalol doses and cleviprex gtt, tPA started at 2253.    Daylene Posey 04/20/19 10:56 PM

## 2019-04-20 NOTE — Code Documentation (Signed)
Responded to Code Stroke called at 2138 for L sided facial droop. [id number]. Pt arrived at 2158. NIH-2 for slurred speech and L facial droop. CBG-129. CT head negative. TPA not given, "too good to treat." TPA window ends at 0120.

## 2019-04-20 NOTE — ED Notes (Signed)
RT obtaining ABG

## 2019-04-20 NOTE — H&P (Addendum)
Chief Complaint:  Slurred speech, left facial droop  History obtained from: Patient and Chart    HPI:                                                                                                                                       Andrew Sparks is a 53 y.o. 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  Ed course  On arrival patient had improved, NIH stroke scale was 2 for mild nasolabial fold flattening and slurred speech.  He had no drift on examination.  Blood pressure was extremely elevated, >200/140 mmHg. CT Head was not remarkable for stroke.  tPA was not administered as symptoms were too mild to treat.  CTA not performed immediately as clinically not LVO  At 10:30 PM .patient then clinically worsened, now had drift in both left  arm and leg with significant ataxia on the left side and worsening facial droop.  NIH stroke scale was 7. Called pharmacist to give tPA. Delay in TPA as patient required multiple doses of labetalol/Cleviprex infusion to bring blood pressure down.   tPA was administer at 22.53 with delay due to difficult to control blood pressure. After starting tPA., blood pressure transiently went down as low as 100/ 90 mmHg, however quickly went back up to 134/120 mmHg.  Cleviprex was stopped and restarted multiple times during TPA administration with continued fluctuation in blood pressure.  Stroke symptoms worsened during this period, with patient being almost plegic at one point. Arterial line was placed in the emergency room and his blood pressure was titrated to upper limits post TPA parameters, patient started to improve.  Patient also was complaining of difficulty  breathing and so EDP Dr. Rhunette CroftNanavati who recommended starting patient on BiPAP, however since COVID was not ruled out patient was not placed on it. Stat ABG was obtained,  did not show hypercarbia and patient has been maintaining airway, although at times becomes apneic.  Reassessed by EDP (Dr. Preston FleetingGlick as Dr. Rhunette CroftNanavati had completed shift) who felt patient no longer needed to be placed on BiPAP at the moment.  Course in ICU On arrival, NIH stroke scale 6.  Cleviprex restarted as blood pressure high.  Arterial line unfortunately infiltrates.  Blood pressure continues to fluctuate, requiring us to stop Cleviprex.  Clinically worsens with blood pressure drops.  Date last known well: 5.17.20 Time last known well: 8.50 PM tPA Given: yes NIHSS: 2 >>> 7 at time of decision tPA administration Baseline MRS 0    Past Medical History:  Diagnosis Date  . A-fib (HCC)   . Anxiety   . CHF (congestive  heart failure) (HCC)   . Depression   . Hypersomnia with sleep apnea   . Hypertension   . Morbid obesity (HCC)   . NSTEMI (non-ST elevated myocardial infarction) (HCC) 2005  . Obesity hypoventilation syndrome (HCC)   . Shortness of breath dyspnea   . Sleep apnea   . Stroke (HCC)   . V-tach [facility])     Past Surgical History:  Procedure Laterality Date  . LEFT HEART CATH AND CORONARY ANGIOGRAPHY Right 09/10/2018   Procedure: LEFT HEART CATH AND CORONARY ANGIOGRAPHY with possible percutaneous intervention;  Surgeon: Laurier Nancy, MD;  Location: [facility] INVASIVE CV LAB;  Service: Cardiovascular;  Laterality: Right;  . TRACHEOSTOMY      Family History  Problem Relation Age of Onset  . Heart disease Father   . Diabetes type II Mother   . Breast cancer Mother    Social History:  reports that he has been smoking cigarettes. He has a 14.00 pack-year smoking history. He has never used smokeless tobacco. He reports current alcohol use of about 8.0 standard drinks of alcohol per week. He reports that he does not  use drugs.  Allergies: No Known Allergies  Medications:                                                                                                                        I reviewed home medications   ROS:                                                                                                                                     14 systems reviewed and negative except above   Examination:                                                                                                      General: Appears well-developed, obese Psych: Affect appropriate to situation Eyes: No scleral injection HENT: No OP obstrucion Head: Normocephalic.  Cardiovascular: Normal rate and regular rhythm.  Respiratory: Increased effort in breathing at times GI: Soft.  No distension. There is no tenderness.  Skin: WDI    Neurological Examination ( at time of initial stroke alert)  Mental Status: Alert, oriented, thought content appropriate.  Speech fluent without evidence of aphasia.  Mild dysarthria.  Able to follow 3 step commands without difficulty. Cranial Nerves: II: Visual fields grossly normal,  III,IV, VI: ptosis not present, extra-ocular motions intact bilaterally, pupils equal, round, reactive to light and accommodation V,VII: mild left nasolabial fold flattening, facial light touch sensation normal bilaterally VIII: hearing normal bilaterally IX,X: uvula rises symmetrically XI: bilateral shoulder shrug XII: midline tongue extension Motor: Right : Upper extremity   5/5    Left:     Upper extremity   5/5  Lower extremity   5/5     Lower extremity   5/5 Tone and bulk:normal tone throughout; no atrophy noted Sensory: Pinprick and light touch intact throughout, bilaterally Deep Tendon Reflexes: 2+ and symmetric throughout Plantars: Right: downgoing   Left: downgoing Cerebellar: normal finger-to-nose, normal rapid alternating movements and normal heel-to-shin test Gait: Not  assessed   Neuro exam at 10:40 PM -Moderate left facial droop, dysarthria, left arm drift and left leg drift with ataxia in both extremities.  Neuro exam around 11:30 PM -Patient drowsy, arousable and answers questions appropriately oriented x4.  Left facial droop and significant dysarthria.  1/5 strength in the left upper extremity and 0/5 strength in the left lower extremity.  Sensation fully intact.  Neuro exam at 2 AM -Alert, mild dysarthria and mild facial droop.  4 out of 5 strength in left upper extremity.  4+5 strength and normal left lower extremity without drift.    Lab Results: Basic Metabolic Panel: Recent Labs  Lab 04/20/19 2200 04/20/19 2207 04/20/19 2323  NA 127* 127* 128*  K 3.9 3.9 3.7  CL 91* 93*  --   CO2 20*  --   --   GLUCOSE 129* 129*  --   BUN 13 14  --   CREATININE 1.23 1.30*  --   CALCIUM 8.7*  --   --     CBC: Recent Labs  Lab 04/20/19 2200 04/20/19 2207 04/20/19 2323  WBC 7.1  --   --   NEUTROABS 4.9  --   --   HGB 16.4 18.0* 17.0  HCT 48.3 53.0* 50.0  MCV 97.4  --   --   PLT 195  --   --     Coagulation Studies: Recent Labs    04/20/19 2200  LABPROT 14.4  INR 1.1    Imaging: Ct Head Code Stroke Wo Contrast  Result Date: 04/20/2019 CLINICAL DATA:  Code stroke. 53 year old male with left facial droop and slurred speech. Last seen normal 2050 hours. EXAM: CT HEAD WITHOUT CONTRAST TECHNIQUE: Contiguous axial images were obtained from the base of the skull through the vertex without intravenous contrast. COMPARISON:  Head CT 05/27/2008. FINDINGS: Brain: No midline shift, mass effect, or evidence of intracranial mass lesion. No ventriculomegaly. Cerebral volume remains normal. New Patchy and confluent bilateral cerebral white matter hypodensity since 2009. Curvilinear hypodensity in the left thalamus is new and age indeterminate. Patchy hypodensity in the posterior inferior right cerebellum appears chronic and not definitely changed. No  cortically based acute infarct identified. Vascular: Calcified atherosclerosis at the skull base. No suspicious intracranial vascular hyperdensity. Skull: No acute osseous abnormality identified. Sinuses/Orbits: Mild paranasal sinus mucosal thickening, mostly in the maxillary sinuses, since 2009. Small left frontal  sinus osteoma has increased (normal variant). Tympanic cavities and mastoids remain clear. Other: Small round left parietal convexity lipoma redemonstrated on series 4, image 67. No acute orbit or scalp soft tissue findings. ASPECTS [facility] Stroke Program Early CT Score) - Ganglionic level infarction (caudate, lentiform nuclei, internal capsule, insula, M1-M3 cortex): 7 - Supraganglionic infarction (M4-M6 cortex): 3 Total score (0-10 with 10 being normal): 10 IMPRESSION: 1. No acute cortically based infarct or acute intracranial hemorrhage identified. ASPECTS 10. 2. Progressed since 2009 and age indeterminate small vessel disease including in the left thalamus. 3. Chronic right PICA infarct appears stable. 4. These results were communicated to Dr. Laurence Slate at 10:17 pmon 5/17/2020by text page via the [facility] messaging system. Electronically Signed   By: Odessa Fleming M.D.   On: 04/20/2019 22:17     ASSESSMENT AND PLAN  Mr. Silber is a 53 year old male with past medical history of atrial fibrillation not on anticoagulation,   This is a challenging case as patient presents with capsule warning syndrome.  Received TPA as symptoms worsened.  Unfortunately, and hypertensive crisis on arrival with high diastolic blood pressure.  Attempts to reduce blood pressure to post TPA parameters poses a challenge as patient becomes symptomatic quite easily with even with small drops in blood pressure.   Acute ischemic stroke status post TPA Capsular warning syndrome Hypertensive crisis  Recommend # MRI of the brain without contrast  #CTA of the head and neck #Transthoracic Echo  #Hold antiplatelets until 24 hours  after TPA, #Start or continue Atorvastatin 40 mg/other high intensity statin # BP goal: As stated below # HBAIC and Lipid profile # Telemetry monitoring # Frequent neuro checks # NPO    Hypertensive crisis -Patient on clevidipine for blood pressure control -Arterial line placed, however infiltrated.  RRT called for replacement of arterial line. -We will attempt to maintain tight blood pressure range -BP goal systolic 1 70-180 diastolic 100-110 mmHg   Chronic respiratory failure -2/2 obesity hypoventilation syndrome, sleep apnea  -Receives oxygen through trach collar at home -ABG performed in ER -Goal saturation 90 to 95%, avoid oxygen supplementation greater than 4 L -Appreciate EDP recommendations, RRT assistance -If patient desaturates below 80% consistently, will call critical care team to assist. - CXR   Atrial fibrillation -No longer taking Xarelto as he could not afford it -May need to restart anticoagulation at some point, currently patient has received TPA.  Timing of anticoagulation will depend on size of stroke/hemorrhage complication. -Rate controlled currently  -Congestive heart failure Gentle IV fluids, avoid volume overload -Currently compensated -Check BNP, chest x-ray  Other medical problems Morbid obesity Anxiety:    Please page stroke NP  Or  PA  Or MD from 8am -4 pm  as this patient from this time will be  followed by the stroke.   You can look them up on [email or web address]  Password [email or web address]    This patient is neurologically critically ill due to stroke s/p TPA. He is at risk for significant risk of neurological worsening from cerebral edema,  death from brain herniation, heart failure, hemorrhagic conversion, infection, respiratory failure and seizure.  This patient is at very higher risk of intracerebral hemorrhage due to blood pressure fluctuations and frequent peaks above post TPA parameters. This patient's care requires constant monitoring of vital signs,  hemodynamics, respiratory and cardiac monitoring, review of multiple databases, neurological assessment, discussion with family, other specialists and medical decision making of high complexity.  I spent 180  minutes of neurocritical time in  the care of this patient including being present the entire duration of TPA administration due to difficulty optimization of blood pressure as well as reassessment patient in the ICU.  Addendum Notified by ICU nurse that blood pressure is below 170 systolic.  Patient not significantly worse.  We will hold off pressors for now.    Sushanth Aroor [facility] Pager Number [phone]

## 2019-04-21 ENCOUNTER — Inpatient Hospital Stay (HOSPITAL_COMMUNITY): Payer: Medicare Other

## 2019-04-21 DIAGNOSIS — F1010 Alcohol abuse, uncomplicated: Secondary | ICD-10-CM

## 2019-04-21 DIAGNOSIS — N179 Acute kidney failure, unspecified: Secondary | ICD-10-CM

## 2019-04-21 DIAGNOSIS — I639 Cerebral infarction, unspecified: Secondary | ICD-10-CM

## 2019-04-21 DIAGNOSIS — I161 Hypertensive emergency: Secondary | ICD-10-CM

## 2019-04-21 DIAGNOSIS — F17200 Nicotine dependence, unspecified, uncomplicated: Secondary | ICD-10-CM

## 2019-04-21 DIAGNOSIS — I4891 Unspecified atrial fibrillation: Secondary | ICD-10-CM

## 2019-04-21 DIAGNOSIS — E871 Hypo-osmolality and hyponatremia: Secondary | ICD-10-CM

## 2019-04-21 DIAGNOSIS — I63311 Cerebral infarction due to thrombosis of right middle cerebral artery: Secondary | ICD-10-CM

## 2019-04-21 LAB — LIPID PANEL
Cholesterol: 155 mg/dL (ref 0–200)
HDL: 81 mg/dL (ref >40)
LDL Cholesterol: 64 mg/dL (ref 0–99)
Total CHOL/HDL Ratio: 1.9 RATIO
Triglycerides: 52 mg/dL (ref <150)
VLDL: 10 mg/dL (ref 0–40)

## 2019-04-21 LAB — ECHOCARDIOGRAM COMPLETE
Height: 71 in
Weight: 4328.07 oz

## 2019-04-21 LAB — GLUCOSE, CAPILLARY
Glucose-Capillary: 120 mg/dL — ABNORMAL HIGH (ref 70–99)
Glucose-Capillary: 99 mg/dL (ref 70–99)
Glucose-Capillary: 108 mg/dL — ABNORMAL HIGH (ref 70–99)
Glucose-Capillary: 94 mg/dL (ref 70–99)

## 2019-04-21 LAB — HEMOGLOBIN A1C
Hgb A1c MFr Bld: 5.1 % (ref 4.8–5.6)
Mean Plasma Glucose: 99.67 mg/dL

## 2019-04-21 LAB — MRSA PCR SCREENING: MRSA by PCR: NEGATIVE

## 2019-04-21 LAB — SARS CORONAVIRUS 2 BY RT PCR (HOSPITAL ORDER, PERFORMED IN ~~LOC~~ HOSPITAL LAB): SARS Coronavirus 2: NEGATIVE

## 2019-04-21 MED ORDER — ALBUTEROL SULFATE (2.5 MG/3ML) 0.083% IN NEBU: 2.5000 mg | INHALATION_SOLUTION | Freq: Four times a day (QID) | RESPIRATORY_TRACT | Status: DC | PRN

## 2019-04-21 MED ORDER — METOPROLOL TARTRATE 25 MG PO TABS
25.0000 mg | ORAL_TABLET | Freq: Two times a day (BID) | ORAL | Status: DC
  Administered 2019-04-21: 25 mg via ORAL
  Filled 2019-04-21: qty 1

## 2019-04-21 MED ORDER — ALPRAZOLAM 0.25 MG PO TABS: 0.2500 mg | ORAL_TABLET | Freq: Every day | ORAL | Status: DC | PRN

## 2019-04-21 MED ORDER — LISINOPRIL 20 MG PO TABS: 20.0000 mg | ORAL_TABLET | Freq: Two times a day (BID) | ORAL | Status: DC

## 2019-04-21 MED ORDER — ORAL CARE MOUTH RINSE
15.0000 mL | Freq: Two times a day (BID) | OROMUCOSAL | Status: DC
  Administered 2019-04-21 – 2019-04-23 (×4): 15 mL via OROMUCOSAL

## 2019-04-21 MED ORDER — LISINOPRIL 20 MG PO TABS
20.0000 mg | ORAL_TABLET | Freq: Two times a day (BID) | ORAL | Status: DC
  Administered 2019-04-21 – 2019-04-24 (×6): 20 mg via ORAL
  Filled 2019-04-21 (×7): qty 1

## 2019-04-21 MED ORDER — FLUOXETINE HCL 20 MG PO CAPS
20.0000 mg | ORAL_CAPSULE | Freq: Every day | ORAL | Status: DC
  Administered 2019-04-21 – 2019-04-24 (×4): 20 mg via ORAL
  Filled 2019-04-21 (×4): qty 1

## 2019-04-21 MED ORDER — AMIODARONE HCL 200 MG PO TABS
400.0000 mg | ORAL_TABLET | Freq: Two times a day (BID) | ORAL | Status: DC
  Administered 2019-04-21 – 2019-04-24 (×7): 400 mg via ORAL
  Filled 2019-04-21 (×7): qty 2

## 2019-04-21 MED ORDER — LORAZEPAM 2 MG/ML IJ SOLN
1.0000 mg | Freq: Four times a day (QID) | INTRAMUSCULAR | Status: AC | PRN
  Administered 2019-04-21 – 2019-04-24 (×4): 1 mg via INTRAVENOUS
  Filled 2019-04-21 (×5): qty 1

## 2019-04-21 MED ORDER — THIAMINE HCL 100 MG/ML IJ SOLN
100.0000 mg | Freq: Every day | INTRAMUSCULAR | Status: DC
  Administered 2019-04-23: 100 mg via INTRAVENOUS
  Filled 2019-04-21: qty 2

## 2019-04-21 MED ORDER — NICOTINE 7 MG/24HR TD PT24
7.0000 mg | MEDICATED_PATCH | Freq: Every day | TRANSDERMAL | Status: DC
  Administered 2019-04-21 – 2019-04-24 (×4): 7 mg via TRANSDERMAL
  Filled 2019-04-21 (×4): qty 1

## 2019-04-21 MED ORDER — CHLORHEXIDINE GLUCONATE CLOTH 2 % EX PADS
6.0000 | MEDICATED_PAD | Freq: Every day | CUTANEOUS | Status: DC
  Administered 2019-04-22 – 2019-04-23 (×2): 6 via TOPICAL

## 2019-04-21 MED ORDER — METOPROLOL TARTRATE 50 MG PO TABS
50.0000 mg | ORAL_TABLET | Freq: Two times a day (BID) | ORAL | Status: DC
  Administered 2019-04-21: 22:00:00 50 mg via ORAL
  Filled 2019-04-21 (×2): qty 1

## 2019-04-21 MED ORDER — ADULT MULTIVITAMIN W/MINERALS CH
1.0000 | ORAL_TABLET | Freq: Every day | ORAL | Status: DC
  Administered 2019-04-21 – 2019-04-24 (×4): 1 via ORAL
  Filled 2019-04-21 (×4): qty 1

## 2019-04-21 MED ORDER — IOHEXOL 350 MG/ML SOLN
80.0000 mL | Freq: Once | INTRAVENOUS | Status: AC | PRN
  Administered 2019-04-21: 80 mL via INTRAVENOUS

## 2019-04-21 MED ORDER — LISINOPRIL 20 MG PO TABS: 20.0000 mg | ORAL_TABLET | Freq: Every day | ORAL | Status: DC

## 2019-04-21 MED ORDER — FOLIC ACID 1 MG PO TABS
1.0000 mg | ORAL_TABLET | Freq: Every day | ORAL | Status: DC
  Administered 2019-04-21 – 2019-04-24 (×4): 1 mg via ORAL
  Filled 2019-04-21 (×4): qty 1

## 2019-04-21 MED ORDER — LORAZEPAM 1 MG PO TABS: 1.0000 mg | ORAL_TABLET | Freq: Four times a day (QID) | ORAL | Status: AC | PRN

## 2019-04-21 MED ORDER — ORAL CARE MOUTH RINSE
15.0000 mL | Freq: Two times a day (BID) | OROMUCOSAL | Status: DC
  Administered 2019-04-21: 15 mL via OROMUCOSAL

## 2019-04-21 MED ORDER — CHLORHEXIDINE GLUCONATE 0.12 % MT SOLN
15.0000 mL | Freq: Two times a day (BID) | OROMUCOSAL | Status: DC
  Administered 2019-04-21 – 2019-04-24 (×7): 15 mL via OROMUCOSAL
  Filled 2019-04-21 (×6): qty 15

## 2019-04-21 MED ORDER — FUROSEMIDE 40 MG PO TABS
40.0000 mg | ORAL_TABLET | Freq: Every day | ORAL | Status: DC
  Administered 2019-04-21 – 2019-04-24 (×4): 40 mg via ORAL
  Filled 2019-04-21 (×4): qty 1

## 2019-04-21 MED ORDER — VITAMIN B-1 100 MG PO TABS
100.0000 mg | ORAL_TABLET | Freq: Every day | ORAL | Status: DC
  Administered 2019-04-21 – 2019-04-24 (×3): 100 mg via ORAL
  Filled 2019-04-21 (×3): qty 1

## 2019-04-21 NOTE — Plan of Care (Signed)
Pt able to participate in some ADLs including eating/drinking, bathing, using urinal.

## 2019-04-21 NOTE — Procedures (Signed)
Arterial Catheter Insertion Procedure Note Andrew Sparks [id number] [birth year]  Procedure: Insertion of Arterial Catheter  Indications: Blood pressure monitoring and Frequent blood sampling  Procedure Details Consent: Risks of procedure as well as the alternatives and risks of each were explained to the (patient/caregiver).  Consent for procedure obtained. Time Out: Verified patient identification, verified procedure, site/side was marked, verified correct patient position, special equipment/implants available, medications/allergies/relevent history reviewed, required imaging and test results available.  Performed  Maximum sterile technique was used including antiseptics, cap, gloves, gown, hand hygiene, mask and sheet. Skin prep: Chlorhexidine; local anesthetic administered 20 gauge catheter was inserted into left radial artery using the Seldinger technique. ULTRASOUND GUIDANCE USED: NO Evaluation Blood flow good; BP tracing good. Complications: No apparent complications.   Andrew Sparks 04/21/2019

## 2019-04-21 NOTE — ED Notes (Signed)
Report called to 4N. Pt to transport to CT prior per neuro

## 2019-04-21 NOTE — ED Provider Notes (Signed)
[facility] EMERGENCY DEPARTMENT Provider Note   CSN: [id number] Arrival date & time: 04/20/19  2158  An emergency department physician performed an initial assessment on this suspected stroke patient at 2158.  History   Chief Complaint Chief Complaint  Patient presents with  . Code Stroke    HPI Andrew Sparks is a 53 y.o. male.     HPI  53 year old male with history of CAD, prior stroke without any residual symptoms, V. tach, CHF, A. fib not on any anticoagulants comes in with chief complaint of sudden onset slurred speech and left-sided facial droop.  According to EMS, patient's wife noted sudden change in patient's speech at 8:50 PM.  They called EMS who decided to bring him to the ER.  Patient reports that his blood pressure was over 200 at home and he took 2 nitroglycerin prior to ED arrival.  Patient denies any weakness in his legs or upper extremity.  Review of system is negative for chest pain, headaches, neck pain  Past Medical History:  Diagnosis Date  . A-fib (HCC)   . Anxiety   . CHF (congestive heart failure) (HCC)   . Depression   . Hypersomnia with sleep apnea   . Hypertension   . Morbid obesity (HCC)   . NSTEMI (non-ST elevated myocardial infarction) (HCC) 2005  . Obesity hypoventilation syndrome (HCC)   . Shortness of breath dyspnea   . Sleep apnea   . Stroke (HCC)   . V-tach [facility])     Patient Active Problem List   Diagnosis Date Noted  . Ischemic stroke (HCC) 04/20/2019  . ARF (acute renal failure) (HCC) 03/26/2018  . CHF (congestive heart failure) (HCC) 07/17/2017  . 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 Panic disorder 01/18/2017  . Chest pain 01/17/2017  . Sleep apnea, obstructive 02/01/2016  . Tracheostomy care (HCC)   . 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 Debility 12/16/2014  . Obesity hypoventilation syndrome (HCC) 12/16/2014  .  Encephalopathy pulmonary 12/15/2014    Past Surgical History:  Procedure Laterality Date  . LEFT HEART CATH AND CORONARY ANGIOGRAPHY Right 09/10/2018   Procedure: LEFT HEART CATH AND CORONARY ANGIOGRAPHY with possible percutaneous intervention;  Surgeon: Laurier Nancy, MD;  Location: [facility] INVASIVE CV LAB;  Service: Cardiovascular;  Laterality: Right;  . TRACHEOSTOMY          Home Medications    Prior to Admission medications   Medication Sig Start Date End Date Taking? Authorizing Provider  albuterol (PROVENTIL HFA;VENTOLIN HFA) 108 (90 Base) MCG/ACT inhaler Inhale 2 puffs into the lungs every 6 (six) hours as needed for wheezing or shortness of breath. 12/17/17   Governor Rooks, MD  ALPRAZolam Prudy Feeler) 0.25 MG tablet Take 1 tablet (0.25 mg total) by mouth daily as needed for anxiety (panic attack). 01/18/17   Clapacs, Jackquline Denmark, MD  amiodarone (PACERONE) 200 MG tablet Take 2 tablets (400 mg total) by mouth 2 (two) times daily. 03/11/19   Rockne Menghini, MD  aspirin EC 81 MG tablet Take 1 tablet (81 mg total) by mouth daily. 09/11/18   Milagros Loll, MD  cloNIDine (CATAPRES) 0.1 MG tablet Take 1 tablet (0.1 mg total) by mouth 3 (three) times daily. Patient taking differently: Take 0.3 mg by mouth 3 (three) times daily.  07/19/17   Houston Siren, MD  FLUoxetine (PROZAC) 20 MG capsule Take 1 capsule (20 mg total) by mouth daily. 07/19/17   Sainani,  Rolly PancakeVivek J, MD  furosemide (LASIX) 40 MG tablet Take 1 tablet (40 mg total) by mouth daily. 07/19/17   Houston SirenSainani, Vivek J, MD  hydrALAZINE (APRESOLINE) 100 MG tablet Take 1 tablet (100 mg total) by mouth 3 (three) times daily. 12/23/14   Love, Evlyn KannerPamela S, PA-C  hydrochlorothiazide (HYDRODIURIL) 25 MG tablet Take 25 mg by mouth daily.     [provider]  lisinopril (PRINIVIL,ZESTRIL) 10 MG tablet Take 1 tablet (10 mg total) by mouth daily. Patient taking differently: Take 20 mg by mouth daily.  07/19/17   Houston SirenSainani, Vivek J, MD  metoprolol tartrate  (LOPRESSOR) 25 MG tablet Take 1 tablet (25 mg total) by mouth 2 (two) times daily. 07/19/17   Houston SirenSainani, Vivek J, MD  rivaroxaban (XARELTO) 20 MG TABS tablet Take 1 tablet (20 mg total) by mouth daily with supper. 03/11/19   Rockne MenghiniNorman, Anne-Caroline, MD    Family History Family History  Problem Relation Age of Onset  . Heart disease Father   . Diabetes type II Mother   . Breast cancer Mother     Social History Social History   Tobacco Use  . Smoking status: Current Every Day Smoker    Packs/day: 0.50    Years: 28.00    Pack years: 14.00    Types: Cigarettes  . Smokeless tobacco: Never Used  Substance Use Topics  . Alcohol use: Yes    Alcohol/week: 8.0 standard drinks    Types: 8 Cans of beer per week  . Drug use: No     Allergies   Patient has no known allergies.   Review of Systems Review of Systems  Constitutional: Positive for activity change.  Respiratory: Negative for chest tightness and shortness of breath.   Cardiovascular: Negative for chest pain.  Neurological: Positive for facial asymmetry and speech difficulty. Negative for dizziness, weakness, numbness and headaches.  Hematological: Does not bruise/bleed easily.  All other systems reviewed and are negative.    Physical Exam Updated Vital Signs BP 104/68   Pulse 75   Temp 98.3 F (36.8 C) (Oral)   Resp (!) 31   Ht 5\' 11"  (1.803 m)   Wt 122.7 kg   SpO2 100%   BMI 37.73 kg/m   Physical Exam Vitals signs and nursing note reviewed.  Constitutional:      Appearance: He is well-developed.  HENT:     Head: Atraumatic.  Neck:     Musculoskeletal: Neck supple.  Cardiovascular:     Rate and Rhythm: Normal rate.  Pulmonary:     Effort: Pulmonary effort is normal.  Skin:    General: Skin is warm.  Neurological:     Mental Status: He is alert and oriented to person, place, and time.     Comments: Patient has slurring of his speech with mild left-sided facial deficits. Cerebellar exam reveals no  dysmetria.  Patient's gross sensory exam for upper and lower extremity exam is normal. Although I did not evaluate his gaze, it appeared that his extraocular muscles are intact      ED Treatments / Results  Labs (all labs ordered are listed, but only abnormal results are displayed) Labs Reviewed  COMPREHENSIVE METABOLIC PANEL - Abnormal; Notable for the following components:      Result Value   Sodium 127 (*)    Chloride 91 (*)    CO2 20 (*)    Glucose, Bld 129 (*)    Calcium 8.7 (*)    AST 72 (*)  ALT 71 (*)    Total Bilirubin 1.7 (*)    Anion gap 16 (*)    All other components within normal limits  I-STAT CHEM 8, ED - Abnormal; Notable for the following components:   Sodium 127 (*)    Chloride 93 (*)    Creatinine, Ser 1.30 (*)    Glucose, Bld 129 (*)    Calcium, Ion 0.99 (*)    TCO2 21 (*)    Hemoglobin 18.0 (*)    HCT 53.0 (*)    All other components within normal limits  CBG MONITORING, ED - Abnormal; Notable for the following components:   Glucose-Capillary 131 (*)    All other components within normal limits  POCT I-STAT 7, (LYTES, BLD GAS, ICA,H+H) - Abnormal; Notable for the following components:   Sodium 128 (*)    Calcium, Ion 1.11 (*)    All other components within normal limits  SARS CORONAVIRUS 2 (HOSPITAL ORDER, PERFORMED IN [place] [facility])  PROTIME-INR  APTT  CBC  DIFFERENTIAL  HEMOGLOBIN A1C  LIPID PANEL  I-STAT ARTERIAL BLOOD GAS, ED    EKG EKG Interpretation  Date/Time:  Sunday Apr 20 2019 22:22:56 EDT Ventricular Rate:  96 PR Interval:    QRS Duration: 123 QT Interval:  459 QTC Calculation: 581 R Axis:   -106 Text Interpretation:  Atrial fibrillation Right bundle branch block Inferior infarct, old Anteroseptal infarct, old No significant change since last tracing Confirmed by Derwood Kaplan [phone]) on 04/21/2019 12:10:44 AM   Radiology Ct Head Code Stroke Wo Contrast  Result Date: 04/20/2019 CLINICAL DATA:  Code stroke.  53 year old male with left facial droop and slurred speech. Last seen normal 2050 hours. EXAM: CT HEAD WITHOUT CONTRAST TECHNIQUE: Contiguous axial images were obtained from the base of the skull through the vertex without intravenous contrast. COMPARISON:  Head CT 05/27/2008. FINDINGS: Brain: No midline shift, mass effect, or evidence of intracranial mass lesion. No ventriculomegaly. Cerebral volume remains normal. New Patchy and confluent bilateral cerebral white matter hypodensity since 2009. Curvilinear hypodensity in the left thalamus is new and age indeterminate. Patchy hypodensity in the posterior inferior right cerebellum appears chronic and not definitely changed. No cortically based acute infarct identified. Vascular: Calcified atherosclerosis at the skull base. No suspicious intracranial vascular hyperdensity. Skull: No acute osseous abnormality identified. Sinuses/Orbits: Mild paranasal sinus mucosal thickening, mostly in the maxillary sinuses, since 2009. Small left frontal sinus osteoma has increased (normal variant). Tympanic cavities and mastoids remain clear. Other: Small round left parietal convexity lipoma redemonstrated on series 4, image 67. No acute orbit or scalp soft tissue findings. ASPECTS [facility] Stroke Program Early CT Score) - Ganglionic level infarction (caudate, lentiform nuclei, internal capsule, insula, M1-M3 cortex): 7 - Supraganglionic infarction (M4-M6 cortex): 3 Total score (0-10 with 10 being normal): 10 IMPRESSION: 1. No acute cortically based infarct or acute intracranial hemorrhage identified. ASPECTS 10. 2. Progressed since 2009 and age indeterminate small vessel disease including in the left thalamus. 3. Chronic right PICA infarct appears stable. 4. These results were communicated to Dr. Laurence Slate at 10:17 pmon 5/17/2020by text page via the [facility] messaging system. Electronically Signed   By: Odessa Fleming M.D.   On: 04/20/2019 22:17    Procedures .Critical Care Performed by:  Derwood Kaplan, MD Authorized by: Derwood Kaplan, MD   Critical care provider statement:    Critical care time (minutes):  58   Critical care was necessary to treat or prevent imminent or life-threatening deterioration of the following  conditions:  CNS failure or compromise   Critical care was time spent personally by me on the following activities:  Discussions with consultants, evaluation of patient's response to treatment, examination of patient, ordering and performing treatments and interventions, ordering and review of laboratory studies, ordering and review of radiographic studies, pulse oximetry, re-evaluation of patient's condition, obtaining history from patient or surrogate and review of old charts   (including critical care time)  Medications Ordered in ED Medications  sodium chloride flush (NS) 0.9 % injection 3 mL (has no administration in time range)  labetalol (NORMODYNE) injection 20 mg (10 mg Intravenous Given 04/20/19 2240)    And  clevidipine (CLEVIPREX) infusion 0.5 mg/mL (1 mg/hr Intravenous New Bag/Given 04/20/19 2315)  midazolam (VERSED) injection 2 mg (2 mg Intravenous Not Given 04/20/19 2322)  alteplase (ACTIVASE) 1 mg/mL infusion 90 mg (90 mg Intravenous New Bag/Given 04/20/19 2252)    Followed by  0.9 %  sodium chloride infusion (has no administration in time range)   stroke: mapping our early stages of recovery book (has no administration in time range)  0.9 %  sodium chloride infusion (has no administration in time range)  acetaminophen (TYLENOL) tablet 650 mg (has no administration in time range)    Or  acetaminophen (TYLENOL) solution 650 mg (has no administration in time range)    Or  acetaminophen (TYLENOL) suppository 650 mg (has no administration in time range)  pantoprazole (PROTONIX) injection 40 mg (has no administration in time range)  labetalol (NORMODYNE) injection 10 mg (10 mg Intravenous Given 04/20/19 2246)  LORazepam (ATIVAN) injection 1 mg (1  mg Intravenous Given 04/20/19 2250)     Initial Impression / Assessment and Plan / ED Course  I have reviewed the triage vital signs and the nursing notes.  Pertinent labs & imaging results that were available during my care of the patient were reviewed by me and considered in my medical decision making (see chart for details).        53 year old male comes in a chief complaint of sudden onset left-sided facial droop and slurred speech.  He has history of stroke with no residual deficits, CAD, A. fib not on any anticoagulation.  On exam he is initially noted to have slurred speech along with mild left-sided facial droop.  His BP was over 200 systolic.  CAT scan did not reveal any brain bleed and the plan was to admit patient to hospitalist without TPA, given the symptoms were not profound.  Within few minutes of the decision to not give TPA, nursing staff called me and informed me that patient's neurologic status had declined.  On my reassessment patient had clear evidence of left upper extremity drift, he was unable to perform cerebellar exam (finger-to-nose) with the left upper extremity.  When we got him up and had him take a couple of steps he was also unsteady.  No clear evidence of lower extremity weakness appreciated.  I called neurology team immediately and they agreed that we should proceed with TPA. Given patient's BP over 190 and 200 SBP on 2 occasions, we ordered IV labetalol and Cleviprex.  TPA was also ordered and pharmacy consulted. I ordered Versed for patient because he was having some anxiety, however I asked the nurse to hold the Versed until after TPA was initiated and BP optimized.  Upon my reassessment, patient's neurologic exam has further declined from mental status perspective.  He is more sleepy, but arousable.  He has received Ativan.  Maps have  dropped to 120 mmhg. neuro team continue to monitoring patient closely.  Final Clinical Impressions(s) / ED Diagnoses    Final diagnoses:  Ischemic stroke [facility])    ED Discharge Orders    None       Derwood Kaplan, MD 04/21/19 0010

## 2019-04-21 NOTE — Progress Notes (Signed)
STROKE TEAM PROGRESS NOTE   SUBJECTIVE (INTERVAL HISTORY) His RN is at the bedside.  Overall his condition is rapidly improving. His left sided weakness much improved, still has mild left hand dexterity difficulty, but left leg is strong. Still has mild left facial droop.    OBJECTIVE Temp:  [97.7 F (36.5 C)-98.4 F (36.9 C)] 97.7 F (36.5 C) (05/18 0753) Pulse Rate:  [43-146] 76 (05/18 0802) Cardiac Rhythm: Atrial fibrillation (05/18 0800) Resp:  [13-32] 15 (05/18 0802) BP: (96-195)/(68-149) 164/135 (05/18 0802) SpO2:  [90 %-100 %] 95 % (05/18 0802) Arterial Line BP: (123-202)/(81-124) 180/113 (05/18 0800) FiO2 (%):  [28 %] 28 % (05/18 0802) Weight:  [122.7 kg] 122.7 kg (05/17 2208)  Recent Labs  Lab 04/20/19 2201 04/21/19 0140  GLUCAP 131* 120*   Recent Labs  Lab 04/20/19 2200 04/20/19 2207 04/20/19 2323  NA 127* 127* 128*  K 3.9 3.9 3.7  CL 91* 93*  --   CO2 20*  --   --   GLUCOSE 129* 129*  --   BUN 13 14  --   CREATININE 1.23 1.30*  --   CALCIUM 8.7*  --   --    Recent Labs  Lab 04/20/19 2200  AST 72*  ALT 71*  ALKPHOS 87  BILITOT 1.7*  PROT 6.8  ALBUMIN 3.6   Recent Labs  Lab 04/20/19 2200 04/20/19 2207 04/20/19 2323  WBC 7.1  --   --   NEUTROABS 4.9  --   --   HGB 16.4 18.0* 17.0  HCT 48.3 53.0* 50.0  MCV 97.4  --   --   PLT 195  --   --    No results for input(s): CKTOTAL, CKMB, CKMBINDEX, TROPONINI in the last 168 hours. Recent Labs    04/20/19 2200  LABPROT 14.4  INR 1.1   No results for input(s): COLORURINE, LABSPEC, PHURINE, GLUCOSEU, HGBUR, BILIRUBINUR, KETONESUR, PROTEINUR, UROBILINOGEN, NITRITE, LEUKOCYTESUR in the last 72 hours.  Invalid input(s): APPERANCEUR     Component Value Date/Time   CHOL 155 04/21/2019 0500   CHOL 150 11/17/2014 0457   TRIG 52 04/21/2019 0500   TRIG 118 12/02/2014 0421   HDL 81 04/21/2019 0500   HDL 39 (L) 11/17/2014 0457   CHOLHDL 1.9 04/21/2019 0500   VLDL 10 04/21/2019 0500   VLDL 16  11/17/2014 0457   LDLCALC 64 04/21/2019 0500   LDLCALC 95 11/17/2014 0457   Lab Results  Component Value Date   HGBA1C 5.1 04/21/2019      Component Value Date/Time   LABOPIA NEGATIVE 11/16/2014 2006   COCAINSCRNUR NEGATIVE 11/16/2014 2006   LABBENZ NEGATIVE 11/16/2014 2006   AMPHETMU NEGATIVE 11/16/2014 2006   THCU NEGATIVE 11/16/2014 2006   LABBARB NEGATIVE 11/16/2014 2006    No results for input(s): ETH in the last 168 hours.  I have personally reviewed the radiological images below and agree with the radiology interpretations.  Ct Angio Head W Or Wo Contrast  Result Date: 04/21/2019 CLINICAL DATA:  53 y/o  M; left facial droop and slurred speech. EXAM: CT ANGIOGRAPHY HEAD AND NECK TECHNIQUE: Multidetector CT imaging of the head and neck was performed using the standard protocol during bolus administration of intravenous contrast. Multiplanar CT image reconstructions and MIPs were obtained to evaluate the vascular anatomy. Carotid stenosis measurements (when applicable) are obtained utilizing NASCET criteria, using the distal internal carotid diameter as the denominator. CONTRAST:  69mL OMNIPAQUE IOHEXOL 350 MG/ML SOLN COMPARISON:  04/20/2019 CT head. FINDINGS: CTA  NECK FINDINGS Aortic arch: Standard branching. Imaged portion shows no evidence of aneurysm or dissection. No significant stenosis of the major arch vessel origins. Mild aortic calcific atherosclerosis. Right carotid system: No evidence of dissection, stenosis (50% or greater) or occlusion. Mild nonspecific calcific atherosclerosis of the carotid bifurcation. Left carotid system: No evidence of dissection, stenosis (50% or greater) or occlusion. Mild nonspecific calcific atherosclerosis of the carotid bifurcation. Vertebral arteries: Codominant. No evidence of dissection, stenosis (50% or greater) or occlusion. Skeleton: Negative. Other neck: Negative. Upper chest: Enlarged main pulmonary artery measuring 4.1 cm. Review of the  MIP images confirms the above findings CTA HEAD FINDINGS Anterior circulation: No significant stenosis, proximal occlusion, aneurysm, or vascular malformation. Calcific atherosclerosis of the carotid siphons with mild less than 50% paraclinoid stenosis. Posterior circulation: No significant stenosis, proximal occlusion, aneurysm, or vascular malformation. Non stenotic calcifications of the vertebral arteries. Venous sinuses: As permitted by contrast timing, patent. Anatomic variants: None significant. Delayed phase: No abnormal intracranial enhancement. Review of the MIP images confirms the above findings IMPRESSION: 1. Patent carotid and vertebral arteries. No dissection, aneurysm, or hemodynamically significant stenosis utilizing NASCET criteria. 2. Patent anterior and posterior intracranial circulation. No large vessel occlusion, aneurysm, or significant stenosis. 3. Mild calcific atherosclerosis of the aorta, carotid systems, and vertebral arteries. 4. Enlarged main pulmonary artery compatible pulmonary artery hypertension. Electronically Signed   By: Mitzi HansenLance  Furusawa-Stratton M.D.   On: 04/21/2019 01:39   Ct Angio Neck W Or Wo Contrast  Result Date: 04/21/2019 CLINICAL DATA:  53 y/o  M; left facial droop and slurred speech. EXAM: CT ANGIOGRAPHY HEAD AND NECK TECHNIQUE: Multidetector CT imaging of the head and neck was performed using the standard protocol during bolus administration of intravenous contrast. Multiplanar CT image reconstructions and MIPs were obtained to evaluate the vascular anatomy. Carotid stenosis measurements (when applicable) are obtained utilizing NASCET criteria, using the distal internal carotid diameter as the denominator. CONTRAST:  80mL OMNIPAQUE IOHEXOL 350 MG/ML SOLN COMPARISON:  04/20/2019 CT head. FINDINGS: CTA NECK FINDINGS Aortic arch: Standard branching. Imaged portion shows no evidence of aneurysm or dissection. No significant stenosis of the major arch vessel origins. Mild  aortic calcific atherosclerosis. Right carotid system: No evidence of dissection, stenosis (50% or greater) or occlusion. Mild nonspecific calcific atherosclerosis of the carotid bifurcation. Left carotid system: No evidence of dissection, stenosis (50% or greater) or occlusion. Mild nonspecific calcific atherosclerosis of the carotid bifurcation. Vertebral arteries: Codominant. No evidence of dissection, stenosis (50% or greater) or occlusion. Skeleton: Negative. Other neck: Negative. Upper chest: Enlarged main pulmonary artery measuring 4.1 cm. Review of the MIP images confirms the above findings CTA HEAD FINDINGS Anterior circulation: No significant stenosis, proximal occlusion, aneurysm, or vascular malformation. Calcific atherosclerosis of the carotid siphons with mild less than 50% paraclinoid stenosis. Posterior circulation: No significant stenosis, proximal occlusion, aneurysm, or vascular malformation. Non stenotic calcifications of the vertebral arteries. Venous sinuses: As permitted by contrast timing, patent. Anatomic variants: None significant. Delayed phase: No abnormal intracranial enhancement. Review of the MIP images confirms the above findings IMPRESSION: 1. Patent carotid and vertebral arteries. No dissection, aneurysm, or hemodynamically significant stenosis utilizing NASCET criteria. 2. Patent anterior and posterior intracranial circulation. No large vessel occlusion, aneurysm, or significant stenosis. 3. Mild calcific atherosclerosis of the aorta, carotid systems, and vertebral arteries. 4. Enlarged main pulmonary artery compatible pulmonary artery hypertension. Electronically Signed   By: Mitzi HansenLance  Furusawa-Stratton M.D.   On: 04/21/2019 01:39   Ct Head Code Stroke Wo Contrast  Result Date: 04/20/2019 CLINICAL DATA:  Code stroke. 53 year old male with left facial droop and slurred speech. Last seen normal 2050 hours. EXAM: CT HEAD WITHOUT CONTRAST TECHNIQUE: Contiguous axial images were  obtained from the base of the skull through the vertex without intravenous contrast. COMPARISON:  Head CT 05/27/2008. FINDINGS: Brain: No midline shift, mass effect, or evidence of intracranial mass lesion. No ventriculomegaly. Cerebral volume remains normal. New Patchy and confluent bilateral cerebral white matter hypodensity since 2009. Curvilinear hypodensity in the left thalamus is new and age indeterminate. Patchy hypodensity in the posterior inferior right cerebellum appears chronic and not definitely changed. No cortically based acute infarct identified. Vascular: Calcified atherosclerosis at the skull base. No suspicious intracranial vascular hyperdensity. Skull: No acute osseous abnormality identified. Sinuses/Orbits: Mild paranasal sinus mucosal thickening, mostly in the maxillary sinuses, since 2009. Small left frontal sinus osteoma has increased (normal variant). Tympanic cavities and mastoids remain clear. Other: Small round left parietal convexity lipoma redemonstrated on series 4, image 67. No acute orbit or scalp soft tissue findings. ASPECTS [facility] Stroke Program Early CT Score) - Ganglionic level infarction (caudate, lentiform nuclei, internal capsule, insula, M1-M3 cortex): 7 - Supraganglionic infarction (M4-M6 cortex): 3 Total score (0-10 with 10 being normal): 10 IMPRESSION: 1. No acute cortically based infarct or acute intracranial hemorrhage identified. ASPECTS 10. 2. Progressed since 2009 and age indeterminate small vessel disease including in the left thalamus. 3. Chronic right PICA infarct appears stable. 4. These results were communicated to Dr. Laurence Slate at 10:17 pmon 5/17/2020by text page via the [facility] messaging system. Electronically Signed   By: Odessa Fleming M.D.   On: 04/20/2019 22:17   MRI  Pending   TTE pending    PHYSICAL EXAM  Temp:  [97.7 F (36.5 C)-98.4 F (36.9 C)] 97.7 F (36.5 C) (05/18 0753) Pulse Rate:  [43-146] 76 (05/18 0802) Resp:  [13-32] 15 (05/18 0802) BP:  (96-195)/(68-149) 164/135 (05/18 0802) SpO2:  [90 %-100 %] 95 % (05/18 0802) Arterial Line BP: (123-202)/(81-124) 180/113 (05/18 0800) FiO2 (%):  [28 %] 28 % (05/18 0802) Weight:  [122.7 kg] 122.7 kg (05/17 2208)  General - Well nourished, well developed, in no apparent distress.  Ophthalmologic - fundi not visualized due to noncooperation.  Cardiovascular - irregularly irregular heart rate and rhythm.  Mental Status -  Level of arousal and orientation to time, place, and person were intact. Language including expression, naming, repetition, comprehension was assessed and found intact. Fund of Knowledge was assessed and was intact.  Cranial Nerves II - XII - II - Visual field intact OU. III, IV, VI - Extraocular movements intact. V - Facial sensation intact bilaterally. VII - mild left nasolabial fold flattening. VIII - Hearing & vestibular intact bilaterally. X - Palate elevates symmetrically. XI - Chin turning & shoulder shrug intact bilaterally. XII - Tongue protrusion intact.  Motor Strength - The patient's strength was normal in all extremities and pronator drift was absent except left hand mild dexterity difficulty.  Bulk was normal and fasciculations were absent.   Motor Tone - Muscle tone was assessed at the neck and appendages and was normal.  Reflexes - The patient's reflexes were symmetrical in all extremities and he had no pathological reflexes.  Sensory - Light touch, temperature/pinprick were assessed and were symmetrical.    Coordination - The patient had normal movements in the hands with no ataxia or dysmetria.  Tremor was absent.  Gait and Station - deferred.   ASSESSMENT/PLAN [name] is  a 53 y.o. male with history of Afib not compliant with Xarelto, CHF, HTN, NSTEMI on ASA, obesity, OSA s/p trach with stroma, V-tach, stroke admitted for left sided weakness, left facial droop and slurry speech. TPA given due to worsening symptoms.     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 Unstable . BP fluctuate and neuro symptoms sensitive to BP change . Permissive hypertension (OK if <180/105) within 24h tPA . Will slowly resume home meds - today resume lasix and metoprolol  . On cleviprex . Taper off as able . DC Aline as it is not working  Long term BP goal normotensive  afib RVR  Resume amiodarone and metoprolo  Resume lasix  HR 100s  Tele monitoring  Resume AC depends on MRI and after 24h tPA  Tobacco abuse  Current smoker  Smoking cessation counseling provided  Nicotine patch provided  Pt is willing to quit  Alcohol use  Put on CIWA protocol  Ativan PRN  On FA/B1/MVI  Other Stroke Risk Factors  Obesity, Body mass index is 37.73 kg/m.   Hx stroke/TIA  Coronary artery disease  Obstructive sleep apnea s/p trach stroma on trach collar at night at home  Other Active Problems  Hyponatremia - fluid restriction 1500 per day - close monitoring  Elevated Cre - 1.23->1.30  Elevated LFT - AST/ALT 72/71  Hospital day # 1  This patient is critically ill due to stroke s/p tPA, uncontrolled HTN, alcohol abuse, smoker, OSA s/p trach and at significant risk of neurological  worsening, death form recurrent stroke, hemorrhagic conversion, seizure, hypertensive emergency, DT, respiratory failure. This patient's care requires constant monitoring of vital signs, hemodynamics, respiratory and cardiac monitoring, review of multiple databases, neurological assessment, discussion with family, other specialists and medical decision making of high complexity. I spent 35 minutes of neurocritical care time in the care of this patient. I had long discussion with pt at bedside, updated pt current condition, treatment plan and potential prognosis. He expressed understanding and appreciation.    Marvel Plan, MD PhD Stroke Neurology 04/21/2019 10:02 AM    To contact Stroke Continuity provider, please refer to [email or web address]. After hours, contact General Neurology

## 2019-04-21 NOTE — Progress Notes (Signed)
OT Cancellation Note  Patient Details Name: Abduallah Doolittle MRN: [id number] DOB: November 12, 1966   Cancelled Treatment:    Reason Eval/Treat Not Completed: Patient not medically ready.  Will reattempt.  Jeani Hawking, OTR/L Acute Rehabilitation Services Pager [phone] Office [phone]   Jeani Hawking M 04/21/2019, 11:40 AM

## 2019-04-21 NOTE — Code Documentation (Signed)
Pt's NIH worse and decision made to give TPA. BP-195/142. 10mg  labetolol given x2 and cleviprex gtt started. Despite interventions, DBP very difficult to get below parameters to give TPA.  1mg  ativan given at 2250 d/t pt agitation and TPA able to be started at 2252.

## 2019-04-21 NOTE — Progress Notes (Signed)
Upon assessment, pts, L radial a line was malpositioned, and infiltrated with hematoma forming. Pressure being held, and a line removed

## 2019-04-21 NOTE — Progress Notes (Signed)
SLP Cancellation Note  Patient Details Name: Andrew Sparks MRN: [id number] DOB: [birth year]   Cancelled treatment:        Pt preparing to use urinal first attempt. Second attempt was eating breakfast. Will complete speech-cognitive-language assessment as soon as able.    Royce Macadamia 04/21/2019, 2:18 PM   Breck Coons Lonell Face.Ed Nurse, children's [phone] Office [phone]

## 2019-04-21 NOTE — Progress Notes (Signed)
Echocardiogram 2D Echocardiogram has been performed.  Andrew Sparks 04/21/2019, 8:44 AM

## 2019-04-21 NOTE — Progress Notes (Addendum)
Reviewing patient's chart concerning post-tPA BP and mNIHSS. Patient noted to have prolonged elevated diastolic. RN contacted and reported patient no longer had A-Line and had been difficult to manage throughout the night. The MD had not been contacted about the DBP at this time. This RN reached out to MD Roda Shutters about patient's Diastolic BP. MD is aware and also reports patient being challenging to manage due to neurological worsening with lowering BP. Requested that RN recheck BP on another site to verify. Once verified lower DBP with medication as long as neurological worsening is not noted. DBP Goal 100-110. Paged MD XU if any questions or concerns.

## 2019-04-21 NOTE — Progress Notes (Signed)
PT Cancellation Note  Patient Details Name: Andrew Sparks MRN: [id number] DOB: 1966/08/16   Cancelled Treatment:    Reason Eval/Treat Not Completed: Patient not medically ready   Fabio Asa 04/21/2019, 12:30 PM

## 2019-04-21 NOTE — Progress Notes (Signed)
Patient states he wears aerosol trach collar at night. Patient states he does not wear BIPAP at home. BIPAP is not needed at this time. RN and MD aware. RT will monitor as needed.

## 2019-04-21 NOTE — Progress Notes (Addendum)
Instructed by Dr. Laurence Slate to keep SBP 170-180 and DBP 100-110. Pt has been consistently under goal. Pt is more lethargic than he was on arrival to [facility] but easily arousable, following commands, communicating appropriately. Minimal left arm drift, moderate left facial droop. MD notified that BP is not within parameters, no new orders received. Continuing to monitor.  Per Dr. Laurence Slate, wants BP to stabilize further before having MRI completed.

## 2019-04-21 NOTE — Progress Notes (Signed)
Reviewed MRI brain  R frontal stroke with hemorrhagic conversion PH1 Recommend tighter BP goal of 140-122mmHg systolic, watch for symptomatic worsening of stroke, call MD if this occurs

## 2019-04-21 NOTE — Procedures (Signed)
Arterial Catheter Insertion Procedure Note Hyram Knoell [id number] 21-Oct-1966  Procedure: Insertion of Arterial Catheter  Indications: Blood pressure monitoring  Procedure Details Consent: Risks of procedure as well as the alternatives and risks of each were explained to the (patient/caregiver).  Consent for procedure obtained. Time Out: Verified patient identification, verified procedure, site/side was marked, verified correct patient position, special equipment/implants available, medications/allergies/relevent history reviewed, required imaging and test results available.  Performed  Maximum sterile technique was used including antiseptics, cap, gloves, gown, hand hygiene, mask and sheet. Skin prep: Chlorhexidine;  20 gauge catheter was inserted into right radial artery using the Seldinger technique. ULTRASOUND GUIDANCE USED: NO Evaluation Blood flow good; BP tracing good. Complications: No apparent complications.  Aline was originally placed in left radial artery in ED by ED therapist in which was noticed to be out upon arrival on unit. Per MD, aline medically necessary. RT replaced on right side. Initial BP of 163/95.   Loyal [facility] 04/21/2019

## 2019-04-22 DIAGNOSIS — I6389 Other cerebral infarction: Secondary | ICD-10-CM

## 2019-04-22 DIAGNOSIS — I4821 Permanent atrial fibrillation: Secondary | ICD-10-CM

## 2019-04-22 DIAGNOSIS — I63411 Cerebral infarction due to embolism of right middle cerebral artery: Secondary | ICD-10-CM

## 2019-04-22 LAB — CBC
HCT: 45.6 % (ref 39.0–52.0)
Hemoglobin: 15.8 g/dL (ref 13.0–17.0)
MCH: 33.5 pg (ref 26.0–34.0)
MCHC: 34.6 g/dL (ref 30.0–36.0)
MCV: 96.8 fL (ref 80.0–100.0)
Platelets: 189 10*3/uL (ref 150–400)
RBC: 4.71 MIL/uL (ref 4.22–5.81)
RDW: 14.6 % (ref 11.5–15.5)
WBC: 6 10*3/uL (ref 4.0–10.5)
nRBC: 0 % (ref 0.0–0.2)

## 2019-04-22 LAB — BASIC METABOLIC PANEL
Anion gap: 15 (ref 5–15)
BUN: 10 mg/dL (ref 6–20)
CO2: 26 mmol/L (ref 22–32)
Calcium: 9.3 mg/dL (ref 8.9–10.3)
Chloride: 91 mmol/L — ABNORMAL LOW (ref 98–111)
Creatinine, Ser: 1.06 mg/dL (ref 0.61–1.24)
GFR calc Af Amer: >60 mL/min (ref >60)
GFR calc non Af Amer: >60 mL/min (ref >60)
Glucose, Bld: 104 mg/dL — ABNORMAL HIGH (ref 70–99)
Potassium: 3.5 mmol/L (ref 3.5–5.1)
Sodium: 132 mmol/L — ABNORMAL LOW (ref 135–145)

## 2019-04-22 LAB — GLUCOSE, CAPILLARY
Glucose-Capillary: 98 mg/dL (ref 70–99)
Glucose-Capillary: 99 mg/dL (ref 70–99)
Glucose-Capillary: 103 mg/dL — ABNORMAL HIGH (ref 70–99)

## 2019-04-22 MED ORDER — HYDRALAZINE HCL 100 MG PO TABS: 100.0000 mg | ORAL_TABLET | Freq: Three times a day (TID) | ORAL | Status: DC

## 2019-04-22 MED ORDER — CLONAZEPAM 0.5 MG PO TABS
0.5000 mg | ORAL_TABLET | Freq: Two times a day (BID) | ORAL | Status: DC
  Administered 2019-04-22 – 2019-04-24 (×5): 0.5 mg via ORAL
  Filled 2019-04-22 (×5): qty 1

## 2019-04-22 MED ORDER — METOPROLOL TARTRATE 50 MG PO TABS
75.0000 mg | ORAL_TABLET | Freq: Two times a day (BID) | ORAL | Status: DC
  Administered 2019-04-22 – 2019-04-24 (×5): 75 mg via ORAL
  Filled 2019-04-22 (×4): qty 1

## 2019-04-22 MED ORDER — PANTOPRAZOLE SODIUM 40 MG PO TBEC
40.0000 mg | DELAYED_RELEASE_TABLET | Freq: Every day | ORAL | Status: DC
  Administered 2019-04-22 – 2019-04-23 (×2): 40 mg via ORAL
  Filled 2019-04-22 (×2): qty 1

## 2019-04-22 MED ORDER — AMLODIPINE BESYLATE 10 MG PO TABS: 10.0000 mg | ORAL_TABLET | Freq: Every day | ORAL | Status: DC

## 2019-04-22 MED ORDER — HYDRALAZINE HCL 50 MG PO TABS
50.0000 mg | ORAL_TABLET | Freq: Three times a day (TID) | ORAL | Status: DC
  Administered 2019-04-22 – 2019-04-23 (×4): 50 mg via ORAL
  Filled 2019-04-22 (×8): qty 1

## 2019-04-22 NOTE — Evaluation (Addendum)
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 PTA pt reports independence with ADL and functional mobility. Pt performing functional mobility during session without AD and overall minguard assist. Currently requires minguard assist for standing grooming and toileting ADL, modA for LB ADL; pt also presenting with weakness, impaired fine motor/coordination in LUE. He will benefit from continued acute OT services and follow up outpatient neuro OT services to maximize pt's safety and independence with ADL and mobility as well as to further address LUE deficits. Will follow. SBP<160 post activity this session.     Follow Up Recommendations  Outpatient OT;Supervision/Assistance - 24 hour(24hr initially, outpatient neuro )    Equipment Recommendations  3 in 1 bedside commode(for use in shower, pending progress)           Precautions / Restrictions Precautions Precautions: Fall Restrictions Weight Bearing Restrictions: No      Mobility Bed Mobility Overal bed mobility: Needs Assistance Bed Mobility: Supine to Sit     Supine to sit: Supervision     General bed mobility comments: supervision for line management and safety, some impulsivity with speed of movement  Transfers Overall transfer level: Needs assistance Equipment used: None Transfers: Sit to/from Stand Sit to Stand: Min guard   Squat pivot transfers: Min guard     General transfer comment: min guard for safety and stability. Good timing to perform power up    Balance Overall balance assessment: Needs assistance   Sitting balance-Leahy Scale: Good       Standing balance-Leahy Scale: Good Standing balance comment: able to perform  functional tasks and accepts challenges with mobility                           ADL either performed or assessed with clinical judgement   ADL Overall ADL's : Needs assistance/impaired Eating/Feeding: Modified independent;Sitting   Grooming: Oral care;Wash/dry hands;Min guard;Standing Grooming Details (indicate cue type and reason): increased time to utilize grooming items due to LUE incoordination  Upper Body Bathing: Min guard;Set up;Sitting   Lower Body Bathing: Minimal assistance;Sit to/from stand   Upper Body Dressing : Minimal assistance;Sitting   Lower Body Dressing: Moderate assistance;Sit to/from stand Lower Body Dressing Details (indicate cue type and reason): minguard for standing balance; pt with difficulty reaching towards feet at this time Toilet Transfer: Min guard;Ambulation Toilet Transfer Details (indicate cue type and reason): simulated via transfer to recliner, room and hallway level mobility Toileting- Clothing Manipulation and Hygiene: Min guard;Sit to/from stand Toileting - Clothing Manipulation Details (indicate cue type and reason): pt standing to void bladder in bathroom     Functional mobility during ADLs: Min guard       Vision Baseline Vision/History: Wears glasses Wears Glasses: Reading only Patient Visual Report: No change from baseline Vision Assessment?: Yes Eye Alignment: Within Functional Limits Ocular Range of Motion: Within Functional Limits Alignment/Gaze Preference: Within Defined Limits Tracking/Visual Pursuits: Able to track stimulus in all quads without difficulty Visual Fields: No apparent deficits     Perception     Praxis      Pertinent Vitals/Pain Pain Assessment: No/denies pain Faces Pain Scale: No hurt     Hand Dominance Right  Extremity/Trunk Assessment Upper Extremity Assessment Upper Extremity Assessment: LUE deficits/detail LUE Deficits / Details: LUE grossly 4-/5; decreased fine motor/coordination;  denies numbness/tingling LUE Sensation: decreased proprioception LUE Coordination: decreased fine motor;decreased gross motor   Lower Extremity Assessment Lower Extremity Assessment: Defer to PT evaluation       Communication Communication Communication: No difficulties   Cognition Arousal/Alertness: Awake/alert Behavior During Therapy: WFL for tasks assessed/performed Overall Cognitive Status: Within Functional Limits for tasks assessed                                 General Comments: appears WFL for basic tasks assessed todday; will continue to assess for higher level   General Comments  VSS, SBP maintaining <160 post standing activity     Exercises     Shoulder Instructions      Home Living Family/patient expects to be discharged to:: Private residence Living Arrangements: Other (Comment)(girlfriend) Available Help at Discharge: Family;Available PRN/intermittently Type of Home: Apartment Home Access: Stairs to enter Entrance Stairs-Number of Steps: 1 Entrance Stairs-Rails: None Home Layout: One level     Bathroom Shower/Tub: Chief Strategy Officer: Standard     Home Equipment: None          Prior Functioning/Environment Level of Independence: Independent        Comments: pt on disability        OT Problem List: Decreased strength;Decreased range of motion;Decreased activity tolerance;Impaired balance (sitting and/or standing);Decreased coordination;Impaired UE functional use      OT Treatment/Interventions: Self-care/ADL training;Therapeutic exercise;Neuromuscular education;DME and/or AE instruction;Therapeutic activities;Patient/family education;Balance training    OT Goals(Current goals can be found in the care plan section) Acute Rehab OT Goals Patient Stated Goal: regain independence OT Goal Formulation: With patient Time For Goal Achievement: 05/06/19 Potential to Achieve Goals: Good  OT Frequency: Min 2X/week    Barriers to D/C:            Co-evaluation PT/OT/SLP Co-Evaluation/Treatment: Yes Reason for Co-Treatment: For patient/therapist safety;To address functional/ADL transfers;Necessary to address cognition/behavior during functional activity          AM-PAC OT "6 Clicks" Daily Activity     Outcome Measure Help from another person eating meals?: None Help from another person taking care of personal grooming?: A Little Help from another person toileting, which includes using toliet, bedpan, or urinal?: A Little Help from another person bathing (including washing, rinsing, drying)?: A Little Help from another person to put on and taking off regular upper body clothing?: A Little Help from another person to put on and taking off regular lower body clothing?: A Lot 6 Click Score: 18   End of Session Equipment Utilized During Treatment: Gait belt Nurse Communication: Mobility status  Activity Tolerance: Patient tolerated treatment well Patient left: in chair;with call bell/phone within reach;with chair alarm set  OT Visit Diagnosis: Unsteadiness on feet (R26.81);Other symptoms and signs involving the nervous system (R29.898)                Time: 2257-5051 OT Time Calculation (min): 25 min Charges:  OT General Charges $OT Visit: 1 Visit OT Evaluation $OT Eval Moderate Complexity: 1 Mod  Marcy Siren, OT [employer] Pager [phone] Office [phone]   Andrew Sparks 04/22/2019, 3:29 PM

## 2019-04-22 NOTE — Progress Notes (Signed)
Labs have not been drawn. Phlebotomist called, will come get them ASAP.

## 2019-04-22 NOTE — Progress Notes (Signed)
Pt very fidgety overnight per report, BP above limit. CIWA ativan given per order.

## 2019-04-22 NOTE — Progress Notes (Signed)
Ongoing difficulty in obtaining BP: pt moving frequesntly. Cuff changed from reg to large, left to right back and forth. Attempting to meet goal of SBP 130-160 and DBP<100, but SBP lowering under 130 before DBP falling below 100. This was communicated with stroke team and MD.

## 2019-04-22 NOTE — Evaluation (Signed)
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 Patient demonstrates deficits in functional mobility as indicated below. Will benefit from continued skilled PT to address deficits and maximize function. Will see as indicated and progress as tolerated.  Do not expect any follow up PT needs after discharge.    Follow Up Recommendations No PT follow up;Supervision for mobility/OOB    Equipment Recommendations  None recommended by PT    Recommendations for Other Services       Precautions / Restrictions Precautions Precautions: Fall      Mobility  Bed Mobility Overal bed mobility: Needs Assistance Bed Mobility: Supine to Sit     Supine to sit: Supervision     General bed mobility comments: supervision for line management and safety, some impulsivity with speed of movement  Transfers Overall transfer level: Needs assistance Equipment used: None Transfers: Sit to/from Stand Sit to Stand: Min guard   Squat pivot transfers: Min guard     General transfer comment: min guard for safety and stability. Good timing to perform power up  Ambulation/Gait Ambulation/Gait assistance: Supervision Gait Distance (Feet): 360 Feet Assistive device: None Gait Pattern/deviations: WFL(Within Functional Limits) Gait velocity: decreased Gait velocity interpretation: 1.31 - 2.62 ft/sec, indicative of limited community ambulator General Gait Details: steady with ambulation and balance activities   Stairs            Wheelchair Mobility    Modified Rankin (Stroke Patients Only) Modified Rankin (Stroke Patients Only) Pre-Morbid Rankin Score: No symptoms Modified Rankin: Moderate disability     Balance Overall  balance assessment: Needs assistance   Sitting balance-Leahy Scale: Good       Standing balance-Leahy Scale: Good Standing balance comment: able to perform functional tasks and accepts challenges with mobility             High level balance activites: Backward walking;Direction changes;Turns;Sudden stops;Head turns(no physical assist or overt LOB)               Pertinent Vitals/Pain Pain Assessment: No/denies pain    Home Living Family/patient expects to be discharged to:: Private residence Living Arrangements: Other (Comment)(girlfriend) Available Help at Discharge: Family;Available PRN/intermittently Type of Home: Apartment Home Access: Stairs to enter Entrance Stairs-Rails: None Entrance Stairs-Number of Steps: 1 Home Layout: One level Home Equipment: None      Prior Function Level of Independence: Independent         Comments: pt on disability     Hand Dominance   Dominant Hand: Right    Extremity/Trunk Assessment   Upper Extremity Assessment Upper Extremity Assessment: LUE deficits/detail LUE Deficits / Details: noted ssymetrical weakness LUE Sensation: decreased proprioception LUE Coordination: decreased fine motor;decreased gross motor    Lower Extremity Assessment Lower Extremity Assessment: Overall WFL for tasks assessed       Communication   Communication: No difficulties  Cognition Arousal/Alertness: Awake/alert Behavior During Therapy: WFL for tasks assessed/performed                                          General Comments      Exercises     Assessment/Plan  PT Assessment Patient needs continued PT services  PT Problem List Decreased strength;Decreased activity tolerance;Decreased balance;Decreased mobility       PT Treatment Interventions DME instruction;Gait training;Functional mobility training;Therapeutic activities;Therapeutic exercise;Balance training;Neuromuscular re-education;Patient/family  education    PT Goals (Current goals can be found in the Care Plan section)  Acute Rehab PT Goals PT Goal Formulation: With patient Time For Goal Achievement: 05/06/19 Potential to Achieve Goals: Good    Frequency Min 4X/week   Barriers to discharge        Co-evaluation PT/OT/SLP Co-Evaluation/Treatment: Yes Reason for Co-Treatment: For patient/therapist safety;Necessary to address cognition/behavior during functional activity PT goals addressed during session: Mobility/safety with mobility;Balance OT goals addressed during session: ADL's and self-care       AM-PAC PT "6 Clicks" Mobility  Outcome Measure Help needed turning from your back to your side while in a flat bed without using bedrails?: None Help needed moving from lying on your back to sitting on the side of a flat bed without using bedrails?: None Help needed moving to and from a bed to a chair (including a wheelchair)?: None Help needed standing up from a chair using your arms (e.g., wheelchair or bedside chair)?: None Help needed to walk in hospital room?: None Help needed climbing 3-5 steps with a railing? : A Little 6 Click Score: 23    End of Session Equipment Utilized During Treatment: Oxygen Activity Tolerance: Patient tolerated treatment well;Patient limited by fatigue Patient left: in chair;with call bell/phone within reach;with chair alarm set Nurse Communication: Mobility status PT Visit Diagnosis: Other symptoms and signs involving the nervous system (R29.898)    Time: 1594-5859 PT Time Calculation (min) (ACUTE ONLY): 26 min   Charges:   PT Evaluation $PT Eval Moderate Complexity: 1 Mod          Charlotte Crumb, PT DPT  Board Certified Neurologic Specialist Acute Rehabilitation Services Pager [phone] Office [phone]   Fabio Asa 04/22/2019, 11:14 AM

## 2019-04-22 NOTE — Progress Notes (Signed)
Patient has grown drowsier with tightened BP goals, current pressure by cuff 157/105 (122). Patient is arousable to voice and remains oriented x4 although his dysarthria has worsened, MD was notified of this and there are no other changes in neuro assessment. Will continue to monitor.  Aris Lot, RN

## 2019-04-22 NOTE — Progress Notes (Signed)
PT Cancellation Note  Patient Details Name: Andrew Sparks MRN: [id number] DOB: 1966/04/24   Cancelled Treatment:    Reason Eval/Treat Not Completed: Patient not medically ready;Active bedrest order Will follow up.   Blake Divine A [name] 04/22/2019, 7:23 AM Mylo Red, PT, DPT Acute Rehabilitation Services Pager [phone] Office [phone]

## 2019-04-22 NOTE — Progress Notes (Signed)
STROKE TEAM PROGRESS NOTE   SUBJECTIVE (INTERVAL HISTORY) Pt lying in bed. BP fluctuates and difficult control. Still on cleviprex. MRI showed right MCA moderate infarct with HT. Continue BP goal < 160. Pt still has mild L hand weakness. He has nicotine patch, but still feel anxious. Has ativan PRN for CIWA protocol. Will add clonazepam    OBJECTIVE Temp:  [97.5 F (36.4 C)-99.2 F (37.3 C)] 98.9 F (37.2 C) (05/19 0800) Pulse Rate:  [40-146] 98 (05/19 0800) Cardiac Rhythm: Atrial fibrillation (05/19 0400) Resp:  [12-31] 25 (05/19 0800) BP: (125-184)/(76-138) 127/88 (05/19 0800) SpO2:  [84 %-100 %] 91 % (05/19 0800) FiO2 (%):  [28 %-35 %] 35 % (05/19 0747)  Recent Labs  Lab 04/21/19 0140 04/21/19 1528 04/21/19 1936 04/21/19 2146 04/22/19 0755  GLUCAP 120* 99 108* 94 98   Recent Labs  Lab 04/20/19 2200 04/20/19 2207 04/20/19 2323  NA 127* 127* 128*  K 3.9 3.9 3.7  CL 91* 93*  --   CO2 20*  --   --   GLUCOSE 129* 129*  --   BUN 13 14  --   CREATININE 1.23 1.30*  --   CALCIUM 8.7*  --   --    Recent Labs  Lab 04/20/19 2200  AST 72*  ALT 71*  ALKPHOS 87  BILITOT 1.7*  PROT 6.8  ALBUMIN 3.6   Recent Labs  Lab 04/20/19 2200 04/20/19 2207 04/20/19 2323  WBC 7.1  --   --   NEUTROABS 4.9  --   --   HGB 16.4 18.0* 17.0  HCT 48.3 53.0* 50.0  MCV 97.4  --   --   PLT 195  --   --    No results for input(s): CKTOTAL, CKMB, CKMBINDEX, TROPONINI in the last 168 hours. Recent Labs    04/20/19 2200  LABPROT 14.4  INR 1.1   No results for input(s): COLORURINE, LABSPEC, PHURINE, GLUCOSEU, HGBUR, BILIRUBINUR, KETONESUR, PROTEINUR, UROBILINOGEN, NITRITE, LEUKOCYTESUR in the last 72 hours.  Invalid input(s): APPERANCEUR     Component Value Date/Time   CHOL 155 04/21/2019 0500   CHOL 150 11/17/2014 0457   TRIG 52 04/21/2019 0500   TRIG 118 12/02/2014 0421   HDL 81 04/21/2019 0500   HDL 39 (L) 11/17/2014 0457   CHOLHDL 1.9 04/21/2019 0500   VLDL 10 04/21/2019  0500   VLDL 16 11/17/2014 0457   LDLCALC 64 04/21/2019 0500   LDLCALC 95 11/17/2014 0457   Lab Results  Component Value Date   HGBA1C 5.1 04/21/2019      Component Value Date/Time   LABOPIA NEGATIVE 11/16/2014 2006   COCAINSCRNUR NEGATIVE 11/16/2014 2006   LABBENZ NEGATIVE 11/16/2014 2006   AMPHETMU NEGATIVE 11/16/2014 2006   THCU NEGATIVE 11/16/2014 2006   LABBARB NEGATIVE 11/16/2014 2006    No results for input(s): ETH in the last 168 hours.  I have personally reviewed the radiological images below and agree with the radiology interpretations.  Ct Angio Head W Or Wo Contrast  Result Date: 04/21/2019 CLINICAL DATA:  53 y/o  M; left facial droop and slurred speech. EXAM: CT ANGIOGRAPHY HEAD AND NECK TECHNIQUE: Multidetector CT imaging of the head and neck was performed using the standard protocol during bolus administration of intravenous contrast. Multiplanar CT image reconstructions and MIPs were obtained to evaluate the vascular anatomy. Carotid stenosis measurements (when applicable) are obtained utilizing NASCET criteria, using the distal internal carotid diameter as the denominator. CONTRAST:  39mL OMNIPAQUE IOHEXOL 350 MG/ML SOLN  COMPARISON:  04/20/2019 CT head. FINDINGS: CTA NECK FINDINGS Aortic arch: Standard branching. Imaged portion shows no evidence of aneurysm or dissection. No significant stenosis of the major arch vessel origins. Mild aortic calcific atherosclerosis. Right carotid system: No evidence of dissection, stenosis (50% or greater) or occlusion. Mild nonspecific calcific atherosclerosis of the carotid bifurcation. Left carotid system: No evidence of dissection, stenosis (50% or greater) or occlusion. Mild nonspecific calcific atherosclerosis of the carotid bifurcation. Vertebral arteries: Codominant. No evidence of dissection, stenosis (50% or greater) or occlusion. Skeleton: Negative. Other neck: Negative. Upper chest: Enlarged main pulmonary artery measuring 4.1 cm.  Review of the MIP images confirms the above findings CTA HEAD FINDINGS Anterior circulation: No significant stenosis, proximal occlusion, aneurysm, or vascular malformation. Calcific atherosclerosis of the carotid siphons with mild less than 50% paraclinoid stenosis. Posterior circulation: No significant stenosis, proximal occlusion, aneurysm, or vascular malformation. Non stenotic calcifications of the vertebral arteries. Venous sinuses: As permitted by contrast timing, patent. Anatomic variants: None significant. Delayed phase: No abnormal intracranial enhancement. Review of the MIP images confirms the above findings IMPRESSION: 1. Patent carotid and vertebral arteries. No dissection, aneurysm, or hemodynamically significant stenosis utilizing NASCET criteria. 2. Patent anterior and posterior intracranial circulation. No large vessel occlusion, aneurysm, or significant stenosis. 3. Mild calcific atherosclerosis of the aorta, carotid systems, and vertebral arteries. 4. Enlarged main pulmonary artery compatible pulmonary artery hypertension. Electronically Signed   By: Mitzi Hansen M.D.   On: 04/21/2019 01:39   Ct Angio Neck W Or Wo Contrast  Result Date: 04/21/2019 CLINICAL DATA:  53 y/o  M; left facial droop and slurred speech. EXAM: CT ANGIOGRAPHY HEAD AND NECK TECHNIQUE: Multidetector CT imaging of the head and neck was performed using the standard protocol during bolus administration of intravenous contrast. Multiplanar CT image reconstructions and MIPs were obtained to evaluate the vascular anatomy. Carotid stenosis measurements (when applicable) are obtained utilizing NASCET criteria, using the distal internal carotid diameter as the denominator. CONTRAST:  80mL OMNIPAQUE IOHEXOL 350 MG/ML SOLN COMPARISON:  04/20/2019 CT head. FINDINGS: CTA NECK FINDINGS Aortic arch: Standard branching. Imaged portion shows no evidence of aneurysm or dissection. No significant stenosis of the major arch vessel  origins. Mild aortic calcific atherosclerosis. Right carotid system: No evidence of dissection, stenosis (50% or greater) or occlusion. Mild nonspecific calcific atherosclerosis of the carotid bifurcation. Left carotid system: No evidence of dissection, stenosis (50% or greater) or occlusion. Mild nonspecific calcific atherosclerosis of the carotid bifurcation. Vertebral arteries: Codominant. No evidence of dissection, stenosis (50% or greater) or occlusion. Skeleton: Negative. Other neck: Negative. Upper chest: Enlarged main pulmonary artery measuring 4.1 cm. Review of the MIP images confirms the above findings CTA HEAD FINDINGS Anterior circulation: No significant stenosis, proximal occlusion, aneurysm, or vascular malformation. Calcific atherosclerosis of the carotid siphons with mild less than 50% paraclinoid stenosis. Posterior circulation: No significant stenosis, proximal occlusion, aneurysm, or vascular malformation. Non stenotic calcifications of the vertebral arteries. Venous sinuses: As permitted by contrast timing, patent. Anatomic variants: None significant. Delayed phase: No abnormal intracranial enhancement. Review of the MIP images confirms the above findings IMPRESSION: 1. Patent carotid and vertebral arteries. No dissection, aneurysm, or hemodynamically significant stenosis utilizing NASCET criteria. 2. Patent anterior and posterior intracranial circulation. No large vessel occlusion, aneurysm, or significant stenosis. 3. Mild calcific atherosclerosis of the aorta, carotid systems, and vertebral arteries. 4. Enlarged main pulmonary artery compatible pulmonary artery hypertension. Electronically Signed   By: Mitzi Hansen M.D.   On: 04/21/2019 01:39  Mr Brain 53 Contrast  Result Date: 04/21/2019 CLINICAL DATA:  53 y/o  M; left facial droop and slurred speech. EXAM: MRI HEAD WITHOUT CONTRAST TECHNIQUE: Multiplanar, multiecho pulse sequences of the brain and surrounding structures were  obtained without intravenous contrast. COMPARISON:  04/21/2019 CTA the head. 04/20/2019 CT head. 05/09/2007 MRI of the head. FINDINGS: Brain: Right lateral frontal lobe area of reduced diffusion measuring 3.4 x 3.1 x 5.1 cm (volume = 28 cm^3) (AP x ML x CC series 3, image 41 and series 4, image 21) compatible with acute/early subacute infarction. Additional punctate foci of infarction are present in the right parietal lobe in the right insula. Acute hemorrhage spanning approximately 2.7 x 2.4 x 1.6 cm (volume = 5.4 cm^3), Heidelberg calssification 1c: PH1, hematoma within infarcted tissue, occupying <30%, no substantive mass effect. There are numerous foci of susceptibility hypointensity compatible with chronic microhemorrhage in a predominantly central distribution involving the brainstem and basal ganglia. There multiple small chronic infarctions within the right cerebellar hemisphere and bilateral thalami. Punctate and early confluent nonspecific T2 FLAIR hyperintensities in subcortical and periventricular white matter are compatible with mild to moderate chronic microvascular ischemic changes. Mild volume loss of the brain. Vascular: Normal flow voids. Skull and upper cervical spine: Normal marrow signal. Sinuses/Orbits: Negative. Other: None. IMPRESSION: 1. Acute/early subacute infarction in the right lateral frontal lobe centered in the pre motor cortex measuring up to 5.1 cm, 28 cc. Acute hemorrhage within the infarct, approximately 5.4 cc. Heidelberg calssification 1c: PH1, hematoma within infarcted tissue, occupying <30%, no substantive mass effect. 2. Mild-to-moderate chronic microvascular ischemic changes and mild volume loss of the brain. Multiple small chronic infarctions in the right cerebellar hemisphere and bilateral thalami. 3. Multiple foci of chronic microhemorrhage in a central distribution likely due to sequelae of chronic hypertension. These results were called by telephone at the time of  interpretation on 04/21/2019 at 11:41 pm to Dr. Arther Dames , who verbally acknowledged these results. Electronically Signed   By: Mitzi Hansen M.D.   On: 04/21/2019 23:43   Ct Head Code Stroke Wo Contrast  Result Date: 04/20/2019 CLINICAL DATA:  Code stroke. 53 year old male with left facial droop and slurred speech. Last seen normal 2050 hours. EXAM: CT HEAD WITHOUT CONTRAST TECHNIQUE: Contiguous axial images were obtained from the base of the skull through the vertex without intravenous contrast. COMPARISON:  Head CT 05/27/2008. FINDINGS: Brain: No midline shift, mass effect, or evidence of intracranial mass lesion. No ventriculomegaly. Cerebral volume remains normal. New Patchy and confluent bilateral cerebral white matter hypodensity since 2009. Curvilinear hypodensity in the left thalamus is new and age indeterminate. Patchy hypodensity in the posterior inferior right cerebellum appears chronic and not definitely changed. No cortically based acute infarct identified. Vascular: Calcified atherosclerosis at the skull base. No suspicious intracranial vascular hyperdensity. Skull: No acute osseous abnormality identified. Sinuses/Orbits: Mild paranasal sinus mucosal thickening, mostly in the maxillary sinuses, since 2009. Small left frontal sinus osteoma has increased (normal variant). Tympanic cavities and mastoids remain clear. Other: Small round left parietal convexity lipoma redemonstrated on series 4, image 67. No acute orbit or scalp soft tissue findings. ASPECTS [facility] Stroke Program Early CT Score) - Ganglionic level infarction (caudate, lentiform nuclei, internal capsule, insula, M1-M3 cortex): 7 - Supraganglionic infarction (M4-M6 cortex): 3 Total score (0-10 with 10 being normal): 10 IMPRESSION: 1. No acute cortically based infarct or acute intracranial hemorrhage identified. ASPECTS 10. 2. Progressed since 2009 and age indeterminate small vessel disease including in the left  thalamus. 3. Chronic right PICA infarct appears stable. 4. These results were communicated to Dr. Laurence Slate at 10:17 pmon 5/17/2020by text page via the [facility] messaging system. Electronically Signed   By: Odessa Fleming M.D.   On: 04/20/2019 22:17   TTE   1. The left ventricle has normal systolic function, with an ejection fraction of 55-60%. The cavity size was normal. There is severely increased left ventricular wall thickness. Left ventricular diastolic function could not be evaluated secondary to  atrial fibrillation. There is incoordinate septal motion.  2. Left atrial size was severely dilated.  3. Right atrial size was severely dilated.  4. The tricuspid valve is not well visualized. Tricuspid valve regurgitation was not assessed by color flow Doppler.  5. The aortic valve was not well visualized. Aortic valve regurgitation was not assessed by color flow Doppler.  6. There is moderate dilatation at the level of the sinuses of Valsalva measuring 47 mm.  7. The inferior vena cava was dilated in size with <50% respiratory variability.  8. The interatrial septum was not well visualized.  9. When compared to the prior study: 09/30/2018: LVEF 65% with anteroseptal hypokinesis.   PHYSICAL EXAM   Temp:  [97.5 F (36.4 C)-99.2 F (37.3 C)] 98.9 F (37.2 C) (05/19 0800) Pulse Rate:  [40-146] 98 (05/19 0800) Resp:  [12-31] 25 (05/19 0800) BP: (125-184)/(76-138) 127/88 (05/19 0800) SpO2:  [84 %-100 %] 91 % (05/19 0800) FiO2 (%):  [28 %-35 %] 35 % (05/19 0747)  General - Well nourished, well developed, in no apparent distress.  Ophthalmologic - fundi not visualized due to noncooperation.  Cardiovascular - irregularly irregular heart rate and rhythm with intermittent RVR.  Mental Status -  Level of arousal and orientation to time, place, and person were intact. Language including expression, naming, repetition, comprehension was assessed and found intact. Fund of Knowledge was assessed and was  intact.  Cranial Nerves II - XII - II - Visual field intact OU. III, IV, VI - Extraocular movements intact. V - Facial sensation intact bilaterally. VII - mild left nasolabial fold flattening. VIII - Hearing & vestibular intact bilaterally. X - Palate elevates symmetrically. XI - Chin turning & shoulder shrug intact bilaterally. XII - Tongue protrusion intact.  Motor Strength - The patient's strength was normal in all extremities and pronator drift was absent except and 4/5 left hand grip left hand dexterity difficulty.  Bulk was normal and fasciculations were absent.   Motor Tone - Muscle tone was assessed at the neck and appendages and was normal.  Reflexes - The patient's reflexes were symmetrical in all extremities and he had no pathological reflexes.  Sensory - Light touch, temperature/pinprick were assessed and were symmetrical.    Coordination - The patient had normal movements in the right hands with no ataxia or dysmetria, but left FTN mild dysmetria and slow in action.  Tremor was absent.  Gait and Station - deferred.   ASSESSMENT/PLAN Andrew Sparks is a 54 y.o. male with history of Afib not compliant with Xarelto, CHF, HTN, NSTEMI on ASA, obesity, OSA s/p trach with stroma, V-tach, stroke admitted for left sided weakness, left facial droop and slurry speech. TPA given due to worsening symptoms.    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  Mild to moderate small vessel disease and atrophy. Multiple chronic infarcts right cerebellar and bilateral thalami.  Multiple foci of chronic microhemorrhages.  CT head repeat in am  2D Echo  EF 55-60%. No embolus  seen. Pt in AF   LDL 64  HgbA1c 5.1  SCDs for VTE prophylaxis  aspirin 81 mg daily and Xarelto (rivaroxaban) daily (not compliant) prior to admission, now on No antithrombotic given hemorrhagic transformation and uncontrolled BP  Therapy recommendations:  pending  Disposition:  pending   Uncontrolled HTN Hypertensive emergency . Unstable . BP fluctuate and neuro symptoms sensitive to BP change . resumed lasix, increased metoprolol to 75mg  bid, resume hydralazine 50mg  tid, resume lisinopril 20 bid . SBP goal < 160 given PH1 HT  . On cleviprex . Taper off as able  Long term BP goal normotensive  afib RVR  Not compliant with Xarelto prior to admission   ON amiodarone and metoprol  ON lasix  HR 100s  Tele monitoring  Hold off antithrombotics now due to moderate sized stroke with HT  Tobacco abuse  Current smoker  Smoking cessation counseling provided  Nicotine patch provided  Pt is willing to quit  Alcohol use  Put on CIWA protocol  Ativan PRN  On FA/B1/MVI  Will put on clonazepam 0.5mg  bid  Other Stroke Risk Factors  Obesity, Body mass index is 37.73 kg/m.   Hx stroke/TIA  Coronary artery disease  Obstructive sleep apnea s/p trach stroma on trach collar at night at home  Other Active Problems  Hyponatremia - Na 127->128->132, fluid restriction 1500 per day - close monitoring  Elevated Cre - 1.23->1.30->1.06  Elevated LFT - AST/ALT 72/71  Hospital day # 2  This patient is critically ill due to stroke with HT s/p tPA, uncontrolled HTN, alcohol abuse, smoker, OSA s/p trach and at significant risk of neurological worsening, death form recurrent stroke, hemorrhagic conversion, seizure, hypertensive emergency, DT, respiratory failure. This patient's care requires constant monitoring of vital signs, hemodynamics, respiratory and cardiac monitoring, review of multiple databases, neurological assessment, discussion with family, other specialists and  medical decision making of high complexity. I spent 35 minutes of neurocritical care time in the care of this patient.    Marvel PlanJindong Hatsue Sime, MD PhD Stroke Neurology 04/22/2019 8:57 AM    To contact Stroke Continuity provider, please refer to [email or web address]. After hours, contact General Neurology

## 2019-04-23 ENCOUNTER — Inpatient Hospital Stay (HOSPITAL_COMMUNITY): Payer: Medicare Other

## 2019-04-23 DIAGNOSIS — I480 Paroxysmal atrial fibrillation: Secondary | ICD-10-CM

## 2019-04-23 LAB — CBC
HCT: 45.1 % (ref 39.0–52.0)
Hemoglobin: 15.5 g/dL (ref 13.0–17.0)
MCH: 33.3 pg (ref 26.0–34.0)
MCHC: 34.4 g/dL (ref 30.0–36.0)
MCV: 96.8 fL (ref 80.0–100.0)
Platelets: 210 10*3/uL (ref 150–400)
RBC: 4.66 MIL/uL (ref 4.22–5.81)
RDW: 14.7 % (ref 11.5–15.5)
WBC: 6.4 10*3/uL (ref 4.0–10.5)
nRBC: 0 % (ref 0.0–0.2)

## 2019-04-23 LAB — BASIC METABOLIC PANEL
Anion gap: 15 (ref 5–15)
BUN: 12 mg/dL (ref 6–20)
CO2: 25 mmol/L (ref 22–32)
Calcium: 9 mg/dL (ref 8.9–10.3)
Chloride: 89 mmol/L — ABNORMAL LOW (ref 98–111)
Creatinine, Ser: 1.05 mg/dL (ref 0.61–1.24)
GFR calc Af Amer: >60 mL/min (ref >60)
GFR calc non Af Amer: >60 mL/min (ref >60)
Glucose, Bld: 94 mg/dL (ref 70–99)
Potassium: 3.3 mmol/L — ABNORMAL LOW (ref 3.5–5.1)
Sodium: 129 mmol/L — ABNORMAL LOW (ref 135–145)

## 2019-04-23 LAB — GLUCOSE, CAPILLARY
Glucose-Capillary: 82 mg/dL (ref 70–99)
Glucose-Capillary: 92 mg/dL (ref 70–99)
Glucose-Capillary: 94 mg/dL (ref 70–99)
Glucose-Capillary: 105 mg/dL — ABNORMAL HIGH (ref 70–99)

## 2019-04-23 MED ORDER — POTASSIUM CHLORIDE CRYS ER 20 MEQ PO TBCR
40.0000 meq | EXTENDED_RELEASE_TABLET | ORAL | Status: AC
  Administered 2019-04-23 (×2): 40 meq via ORAL
  Filled 2019-04-23 (×2): qty 2

## 2019-04-23 MED ORDER — HYDRALAZINE HCL 50 MG PO TABS
100.0000 mg | ORAL_TABLET | Freq: Three times a day (TID) | ORAL | Status: DC
  Administered 2019-04-23 – 2019-04-24 (×2): 100 mg via ORAL
  Filled 2019-04-23 (×2): qty 2

## 2019-04-23 MED ORDER — HYDRALAZINE HCL 50 MG PO TABS
75.0000 mg | ORAL_TABLET | Freq: Three times a day (TID) | ORAL | Status: DC
  Administered 2019-04-23: 75 mg via ORAL
  Filled 2019-04-23: qty 1

## 2019-04-23 MED ORDER — LABETALOL HCL 5 MG/ML IV SOLN: 10.0000 mg | INTRAVENOUS | Status: DC | PRN

## 2019-04-23 NOTE — Progress Notes (Signed)
STROKE TEAM PROGRESS NOTE   SUBJECTIVE (INTERVAL HISTORY) Pt sitting in chair watching TV.  Neuro stable.  Off Cleviprex, BP still at 140s but diastolic around 110.  Repeat CT this morning shows stable hemorrhagic conversion at right frontal lobe.   OBJECTIVE Temp:  [97.7 F (36.5 C)-98.9 F (37.2 C)] 98.9 F (37.2 C) (05/20 1626) Pulse Rate:  [25-108] 108 (05/20 1626) Cardiac Rhythm: Atrial fibrillation;Bundle branch block (05/20 1229) Resp:  [13-39] 16 (05/20 1626) BP: (106-159)/(80-123) 159/98 (05/20 1626) SpO2:  [76 %-100 %] 93 % (05/20 1626) FiO2 (%):  [28 %] 28 % (05/20 1549)  Recent Labs  Lab 04/22/19 1143 04/22/19 2109 04/23/19 0800 04/23/19 1136 04/23/19 1625  GLUCAP 99 103* 82 92 94   Recent Labs  Lab 04/20/19 2200 04/20/19 2207 04/20/19 2323 04/22/19 0903 04/23/19 0251  NA 127* 127* 128* 132* 129*  K 3.9 3.9 3.7 3.5 3.3*  CL 91* 93*  --  91* 89*  CO2 20*  --   --  26 25  GLUCOSE 129* 129*  --  104* 94  BUN 13 14  --  10 12  CREATININE 1.23 1.30*  --  1.06 1.05  CALCIUM 8.7*  --   --  9.3 9.0   Recent Labs  Lab 04/20/19 2200  AST 72*  ALT 71*  ALKPHOS 87  BILITOT 1.7*  PROT 6.8  ALBUMIN 3.6   Recent Labs  Lab 04/20/19 2200 04/20/19 2207 04/20/19 2323 04/22/19 0903 04/23/19 0251  WBC 7.1  --   --  6.0 6.4  NEUTROABS 4.9  --   --   --   --   HGB 16.4 18.0* 17.0 15.8 15.5  HCT 48.3 53.0* 50.0 45.6 45.1  MCV 97.4  --   --  96.8 96.8  PLT 195  --   --  189 210   No results for input(s): CKTOTAL, CKMB, CKMBINDEX, TROPONINI in the last 168 hours. Recent Labs    04/20/19 2200  LABPROT 14.4  INR 1.1   No results for input(s): COLORURINE, LABSPEC, PHURINE, GLUCOSEU, HGBUR, BILIRUBINUR, KETONESUR, PROTEINUR, UROBILINOGEN, NITRITE, LEUKOCYTESUR in the last 72 hours.  Invalid input(s): APPERANCEUR     Component Value Date/Time   CHOL 155 04/21/2019 0500   CHOL 150 11/17/2014 0457   TRIG 52 04/21/2019 0500   TRIG 118 12/02/2014 0421   HDL  81 04/21/2019 0500   HDL 39 (L) 11/17/2014 0457   CHOLHDL 1.9 04/21/2019 0500   VLDL 10 04/21/2019 0500   VLDL 16 11/17/2014 0457   LDLCALC 64 04/21/2019 0500   LDLCALC 95 11/17/2014 0457   Lab Results  Component Value Date   HGBA1C 5.1 04/21/2019      Component Value Date/Time   LABOPIA NEGATIVE 11/16/2014 2006   COCAINSCRNUR NEGATIVE 11/16/2014 2006   LABBENZ NEGATIVE 11/16/2014 2006   AMPHETMU NEGATIVE 11/16/2014 2006   THCU NEGATIVE 11/16/2014 2006   LABBARB NEGATIVE 11/16/2014 2006    No results for input(s): ETH in the last 168 hours.  I have personally reviewed the radiological images below and agree with the radiology interpretations.  Ct Angio Head W Or Wo Contrast  Result Date: 04/21/2019 CLINICAL DATA:  53 y/o  M; left facial droop and slurred speech. EXAM: CT ANGIOGRAPHY HEAD AND NECK TECHNIQUE: Multidetector CT imaging of the head and neck was performed using the standard protocol during bolus administration of intravenous contrast. Multiplanar CT image reconstructions and MIPs were obtained to evaluate the vascular anatomy. Carotid stenosis measurements (  when applicable) are obtained utilizing NASCET criteria, using the distal internal carotid diameter as the denominator. CONTRAST:  52mL OMNIPAQUE IOHEXOL 350 MG/ML SOLN COMPARISON:  04/20/2019 CT head. FINDINGS: CTA NECK FINDINGS Aortic arch: Standard branching. Imaged portion shows no evidence of aneurysm or dissection. No significant stenosis of the major arch vessel origins. Mild aortic calcific atherosclerosis. Right carotid system: No evidence of dissection, stenosis (50% or greater) or occlusion. Mild nonspecific calcific atherosclerosis of the carotid bifurcation. Left carotid system: No evidence of dissection, stenosis (50% or greater) or occlusion. Mild nonspecific calcific atherosclerosis of the carotid bifurcation. Vertebral arteries: Codominant. No evidence of dissection, stenosis (50% or greater) or occlusion.  Skeleton: Negative. Other neck: Negative. Upper chest: Enlarged main pulmonary artery measuring 4.1 cm. Review of the MIP images confirms the above findings CTA HEAD FINDINGS Anterior circulation: No significant stenosis, proximal occlusion, aneurysm, or vascular malformation. Calcific atherosclerosis of the carotid siphons with mild less than 50% paraclinoid stenosis. Posterior circulation: No significant stenosis, proximal occlusion, aneurysm, or vascular malformation. Non stenotic calcifications of the vertebral arteries. Venous sinuses: As permitted by contrast timing, patent. Anatomic variants: None significant. Delayed phase: No abnormal intracranial enhancement. Review of the MIP images confirms the above findings IMPRESSION: 1. Patent carotid and vertebral arteries. No dissection, aneurysm, or hemodynamically significant stenosis utilizing NASCET criteria. 2. Patent anterior and posterior intracranial circulation. No large vessel occlusion, aneurysm, or significant stenosis. 3. Mild calcific atherosclerosis of the aorta, carotid systems, and vertebral arteries. 4. Enlarged main pulmonary artery compatible pulmonary artery hypertension. Electronically Signed   By: Mitzi Hansen M.D.   On: 04/21/2019 01:39   Ct Head Wo Contrast  Result Date: 04/23/2019 CLINICAL DATA:  Stroke follow-up EXAM: CT HEAD WITHOUT CONTRAST TECHNIQUE: Contiguous axial images were obtained from the base of the skull through the vertex without intravenous contrast. COMPARISON:  MRI two days ago FINDINGS: Brain: Moderate sized infarct in the lateral right frontal lobe with unchanged extent when allowing for cross modality differences. Internal hemorrhagic conversion also appears stable and was classified on prior brain MRI. No evidence of new infarct. There is chronic small vessel ischemic gliosis in the cerebral white matter with remote moderate infarction in the inferior right cerebellum. Remote lacunar infarct in the  left thalamus. No hydrocephalus or shift. Vascular: No focal hyperdense vessel. Skull: No acute or aggressive finding Sinuses/Orbits: Negative IMPRESSION: 1. Unchanged extent of right MCA branch infarct with hemorrhagic conversion classified on prior brain MRI. No new abnormality. 2. Remote left thalamic and right inferior cerebellar infarcts. Electronically Signed   By: Marnee Spring M.D.   On: 04/23/2019 04:29   Ct Angio Neck W Or Wo Contrast  Result Date: 04/21/2019 CLINICAL DATA:  53 y/o  M; left facial droop and slurred speech. EXAM: CT ANGIOGRAPHY HEAD AND NECK TECHNIQUE: Multidetector CT imaging of the head and neck was performed using the standard protocol during bolus administration of intravenous contrast. Multiplanar CT image reconstructions and MIPs were obtained to evaluate the vascular anatomy. Carotid stenosis measurements (when applicable) are obtained utilizing NASCET criteria, using the distal internal carotid diameter as the denominator. CONTRAST:  47mL OMNIPAQUE IOHEXOL 350 MG/ML SOLN COMPARISON:  04/20/2019 CT head. FINDINGS: CTA NECK FINDINGS Aortic arch: Standard branching. Imaged portion shows no evidence of aneurysm or dissection. No significant stenosis of the major arch vessel origins. Mild aortic calcific atherosclerosis. Right carotid system: No evidence of dissection, stenosis (50% or greater) or occlusion. Mild nonspecific calcific atherosclerosis of the carotid bifurcation. Left carotid system:  No evidence of dissection, stenosis (50% or greater) or occlusion. Mild nonspecific calcific atherosclerosis of the carotid bifurcation. Vertebral arteries: Codominant. No evidence of dissection, stenosis (50% or greater) or occlusion. Skeleton: Negative. Other neck: Negative. Upper chest: Enlarged main pulmonary artery measuring 4.1 cm. Review of the MIP images confirms the above findings CTA HEAD FINDINGS Anterior circulation: No significant stenosis, proximal occlusion, aneurysm, or  vascular malformation. Calcific atherosclerosis of the carotid siphons with mild less than 50% paraclinoid stenosis. Posterior circulation: No significant stenosis, proximal occlusion, aneurysm, or vascular malformation. Non stenotic calcifications of the vertebral arteries. Venous sinuses: As permitted by contrast timing, patent. Anatomic variants: None significant. Delayed phase: No abnormal intracranial enhancement. Review of the MIP images confirms the above findings IMPRESSION: 1. Patent carotid and vertebral arteries. No dissection, aneurysm, or hemodynamically significant stenosis utilizing NASCET criteria. 2. Patent anterior and posterior intracranial circulation. No large vessel occlusion, aneurysm, or significant stenosis. 3. Mild calcific atherosclerosis of the aorta, carotid systems, and vertebral arteries. 4. Enlarged main pulmonary artery compatible pulmonary artery hypertension. Electronically Signed   By: Mitzi HansenLance  Furusawa-Stratton M.D.   On: 04/21/2019 01:39   Mr Brain Wo Contrast  Result Date: 04/21/2019 CLINICAL DATA:  53 y/o  M; left facial droop and slurred speech. EXAM: MRI HEAD WITHOUT CONTRAST TECHNIQUE: Multiplanar, multiecho pulse sequences of the brain and surrounding structures were obtained without intravenous contrast. COMPARISON:  04/21/2019 CTA the head. 04/20/2019 CT head. 05/09/2007 MRI of the head. FINDINGS: Brain: Right lateral frontal lobe area of reduced diffusion measuring 3.4 x 3.1 x 5.1 cm (volume = 28 cm^3) (AP x ML x CC series 3, image 41 and series 4, image 21) compatible with acute/early subacute infarction. Additional punctate foci of infarction are present in the right parietal lobe in the right insula. Acute hemorrhage spanning approximately 2.7 x 2.4 x 1.6 cm (volume = 5.4 cm^3), Heidelberg calssification 1c: PH1, hematoma within infarcted tissue, occupying <30%, no substantive mass effect. There are numerous foci of susceptibility hypointensity compatible with  chronic microhemorrhage in a predominantly central distribution involving the brainstem and basal ganglia. There multiple small chronic infarctions within the right cerebellar hemisphere and bilateral thalami. Punctate and early confluent nonspecific T2 FLAIR hyperintensities in subcortical and periventricular white matter are compatible with mild to moderate chronic microvascular ischemic changes. Mild volume loss of the brain. Vascular: Normal flow voids. Skull and upper cervical spine: Normal marrow signal. Sinuses/Orbits: Negative. Other: None. IMPRESSION: 1. Acute/early subacute infarction in the right lateral frontal lobe centered in the pre motor cortex measuring up to 5.1 cm, 28 cc. Acute hemorrhage within the infarct, approximately 5.4 cc. Heidelberg calssification 1c: PH1, hematoma within infarcted tissue, occupying <30%, no substantive mass effect. 2. Mild-to-moderate chronic microvascular ischemic changes and mild volume loss of the brain. Multiple small chronic infarctions in the right cerebellar hemisphere and bilateral thalami. 3. Multiple foci of chronic microhemorrhage in a central distribution likely due to sequelae of chronic hypertension. These results were called by telephone at the time of interpretation on 04/21/2019 at 11:41 pm to Dr. Arther DamesSUSHANTH AROOR , who verbally acknowledged these results. Electronically Signed   By: Mitzi HansenLance  Furusawa-Stratton M.D.   On: 04/21/2019 23:43   Ct Head Code Stroke Wo Contrast  Result Date: 04/20/2019 CLINICAL DATA:  Code stroke. 53 year old male with left facial droop and slurred speech. Last seen normal 2050 hours. EXAM: CT HEAD WITHOUT CONTRAST TECHNIQUE: Contiguous axial images were obtained from the base of the skull through the vertex without intravenous contrast.  COMPARISON:  Head CT 05/27/2008. FINDINGS: Brain: No midline shift, mass effect, or evidence of intracranial mass lesion. No ventriculomegaly. Cerebral volume remains normal. New Patchy and  confluent bilateral cerebral white matter hypodensity since 2009. Curvilinear hypodensity in the left thalamus is new and age indeterminate. Patchy hypodensity in the posterior inferior right cerebellum appears chronic and not definitely changed. No cortically based acute infarct identified. Vascular: Calcified atherosclerosis at the skull base. No suspicious intracranial vascular hyperdensity. Skull: No acute osseous abnormality identified. Sinuses/Orbits: Mild paranasal sinus mucosal thickening, mostly in the maxillary sinuses, since 2009. Small left frontal sinus osteoma has increased (normal variant). Tympanic cavities and mastoids remain clear. Other: Small round left parietal convexity lipoma redemonstrated on series 4, image 67. No acute orbit or scalp soft tissue findings. ASPECTS [facility] Stroke Program Early CT Score) - Ganglionic level infarction (caudate, lentiform nuclei, internal capsule, insula, M1-M3 cortex): 7 - Supraganglionic infarction (M4-M6 cortex): 3 Total score (0-10 with 10 being normal): 10 IMPRESSION: 1. No acute cortically based infarct or acute intracranial hemorrhage identified. ASPECTS 10. 2. Progressed since 2009 and age indeterminate small vessel disease including in the left thalamus. 3. Chronic right PICA infarct appears stable. 4. These results were communicated to Dr. Laurence Slate at 10:17 pmon 5/17/2020by text page via the [facility] messaging system. Electronically Signed   By: Odessa Fleming M.D.   On: 04/20/2019 22:17   TTE   1. The left ventricle has normal systolic function, with Andrew ejection fraction of 55-60%. The cavity size was normal. There is severely increased left ventricular wall thickness. Left ventricular diastolic function could not be evaluated secondary to  atrial fibrillation. There is incoordinate septal motion.  2. Left atrial size was severely dilated.  3. Right atrial size was severely dilated.  4. The tricuspid valve is not well visualized. Tricuspid valve  regurgitation was not assessed by color flow Doppler.  5. The aortic valve was not well visualized. Aortic valve regurgitation was not assessed by color flow Doppler.  6. There is moderate dilatation at the level of the sinuses of Valsalva measuring 47 mm.  7. The inferior vena cava was dilated in size with <50% respiratory variability.  8. The interatrial septum was not well visualized.  9. When compared to the prior study: 09/30/2018: LVEF 65% with anteroseptal hypokinesis.   PHYSICAL EXAM   Temp:  [97.7 F (36.5 C)-98.9 F (37.2 C)] 98.9 F (37.2 C) (05/20 1626) Pulse Rate:  [25-108] 108 (05/20 1626) Resp:  [13-39] 16 (05/20 1626) BP: (106-159)/(80-123) 159/98 (05/20 1626) SpO2:  [76 %-100 %] 93 % (05/20 1626) FiO2 (%):  [28 %] 28 % (05/20 1549)  General - Well nourished, well developed, in no apparent distress.  Ophthalmologic - fundi not visualized due to noncooperation.  Cardiovascular - irregularly irregular heart rate and rhythm with intermittent RVR.  Mental Status -  Level of arousal and orientation to time, place, and person were intact. Language including expression, naming, repetition, comprehension was assessed and found intact. Fund of Knowledge was assessed and was intact.  Cranial Nerves II - XII - II - Visual field intact OU. III, IV, VI - Extraocular movements intact. V - Facial sensation intact bilaterally. VII - mild left nasolabial fold flattening. VIII - Hearing & vestibular intact bilaterally. X - Palate elevates symmetrically. XI - Chin turning & shoulder shrug intact bilaterally. XII - Tongue protrusion intact.  Motor Strength - The patient's strength was normal in all extremities and pronator drift was absent except 4/5 left  hand grip left hand dexterity difficulty.  Bulk was normal and fasciculations were absent.   Motor Tone - Muscle tone was assessed at the neck and appendages and was normal.  Reflexes - The patient's reflexes were symmetrical  in all extremities and he had no pathological reflexes.  Sensory - Light touch, temperature/pinprick were assessed and were symmetrical.    Coordination - The patient had normal movements in the right hands with no ataxia or dysmetria, but left FTN mild dysmetria and slow in action.  Tremor was absent.  Gait and Station - deferred.   ASSESSMENT/PLAN [name] is a 53 y.o. male with history of Afib not compliant with Xarelto, CHF, HTN, NSTEMI on ASA, obesity, OSA s/p trach with stroma, V-tach, stroke admitted for left sided weakness, left facial droop and slurry speech. TPA given due to worsening symptoms.    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  Mild to moderate small vessel disease and atrophy. Multiple chronic infarcts right cerebellar and bilateral thalami.  Multiple foci of chronic microhemorrhages.  CT head repeat showed stable hemorrhagic conversion  2D Echo  EF 55-60%. No embolus seen. Pt in AF   LDL 64  HgbA1c 5.1  SCDs for VTE prophylaxis  aspirin 81 mg daily and Xarelto (rivaroxaban) daily (not compliant) prior to admission, now on no antithrombotic given hemorrhagic transformation and uncontrolled BP  Therapy recommendations:  pending  Disposition:  pending   Uncontrolled HTN Hypertensive emergency . Unstable . BP fluctuate and neuro symptoms sensitive to BP change . resumed lasix, increased metoprolol to  bid, increase hydralazine to  tid, resumed lisinopril 20 bid . SBP goal < 160 given PH1 HT  . Off cleviprex  Long term BP goal normotensive  afib RVR  Not compliant with Xarelto prior to admission   ON amiodarone and  metoprol  ON lasix  HR controlled  Tele monitoring  Hold off antithrombotics now due to HT with uncontrolled BP  Tobacco abuse  Current smoker  Smoking cessation counseling provided  Nicotine patch provided  Pt is willing to quit  Alcohol use  Put on CIWA protocol  Ativan PRN  On FA/B1/MVI  Will put on clonazepam 0.5mg  bid   Other Stroke Risk Factors  Obesity, Body mass index is 37.73 kg/m.   Hx stroke/TIA  Coronary artery disease  Obstructive sleep apnea s/p trach stroma on trach collar at night at home  Other Active Problems  Hyponatremia - Na 127->128->132->129, fluid restriction 1500 per day - close monitoring  Elevated Cre - 1.23->1.30->1.06->1.05  Elevated LFT - AST/ALT 72/71  Hospital day # 3  This patient is critically ill due to stroke with HT s/p tPA, uncontrolled HTN, alcohol abuse, smoker, OSA s/p trach and at significant risk of neurological worsening, death form recurrent stroke, hemorrhagic conversion, seizure, hypertensive emergency, DT, respiratory failure. This patient's care requires constant monitoring of vital signs, hemodynamics, respiratory and cardiac monitoring, review of multiple databases, neurological assessment, discussion with family, other specialists and medical decision making of high complexity. I spent 30 minutes of neurocritical care time in the care of this patient.    Marvel Plan, MD PhD Stroke Neurology 04/23/2019 6:20 PM  To contact Stroke Continuity provider, please refer to [email or web address] After hours, contact General Neurology

## 2019-04-23 NOTE — Progress Notes (Signed)
Patient has Stoma wears 28% ATC QHS.  Patient does not wear CPAP at this time states he is going for a sleep study.  Patient vitals stable.  RT will continue to monitor.

## 2019-04-23 NOTE — Progress Notes (Signed)
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 Tolerated ambulation well with improved stability and speed today. Current POC remains appropriate.   Follow Up Recommendations  No PT follow up;Supervision for mobility/OOB     Equipment Recommendations  None recommended by PT    Recommendations for Other Services       Precautions / Restrictions Precautions Precautions: Fall    Mobility  Bed Mobility               General bed mobility comments: received OOB in chair  Transfers Overall transfer level: Needs assistance Equipment used: None Transfers: Sit to/from Stand Sit to Stand: Supervision            Ambulation/Gait Ambulation/Gait assistance: Supervision Gait Distance (Feet): 640 Feet Assistive device: None Gait Pattern/deviations: WFL(Within Functional Limits) Gait velocity: decreased Gait velocity interpretation: 1.31 - 2.62 ft/sec, indicative of limited community ambulator General Gait Details: steady with improved speed   Stairs             Wheelchair Mobility    Modified Rankin (Stroke Patients Only) Modified Rankin (Stroke Patients Only) Pre-Morbid Rankin Score: No symptoms Modified Rankin: Moderate disability     Balance Overall balance assessment: Needs assistance   Sitting balance-Leahy Scale: Good       Standing balance-Leahy Scale: Good Standing balance comment: able to perform functional tasks and accepts challenges with mobility                            Cognition Arousal/Alertness: Awake/alert Behavior During Therapy: WFL for tasks assessed/performed Overall Cognitive Status: Within Functional Limits for tasks assessed                                         Exercises      General Comments        Pertinent Vitals/Pain Pain Assessment: No/denies pain    Home Living                      Prior Function            PT Goals (current goals can now be found in the care plan section) Acute Rehab PT Goals Patient Stated Goal: regain independence PT Goal Formulation: With patient Time For Goal Achievement: 05/06/19 Potential to Achieve Goals: Good    Frequency    Min 4X/week      PT Plan Current plan remains appropriate    Co-evaluation              AM-PAC PT "6 Clicks" Mobility   Outcome Measure  Help needed turning from your back to your side while in a flat bed without using bedrails?: None Help needed moving from lying on your back to sitting on the side of a flat bed without using bedrails?: None Help needed moving to and from a bed to a chair (including a wheelchair)?: None Help needed standing up from a chair using your arms (e.g., wheelchair or bedside chair)?: None Help needed to walk in hospital room?: None Help needed climbing 3-5 steps  with a railing? : A Little 6 Click Score: 23    End of Session Equipment Utilized During Treatment: Oxygen Activity Tolerance: Patient tolerated treatment well;Patient limited by fatigue Patient left: in chair;with call bell/phone within reach;with chair alarm set Nurse Communication: Mobility status PT Visit Diagnosis: Other symptoms and signs involving the nervous system (A76.811)     Time: 5726-2035 PT Time Calculation (min) (ACUTE ONLY): 17 min  Charges:  $Gait Training: 8-22 mins                     Charlotte Crumb, PT DPT  Board Certified Neurologic Specialist Acute Rehabilitation Services Pager [phone] Office [phone]    Andrew Sparks 04/23/2019, 1:05 PM

## 2019-04-23 NOTE — Progress Notes (Addendum)
Late Entry Speech-Language-Cognitive evaluation performed on 5/19     04/22/19 1131  SLP Visit Information  SLP Received On 04/22/19  SLP Time Calculation  SLP Start Time (ACUTE ONLY) 1133  SLP Stop Time (ACUTE ONLY) 1159  SLP Time Calculation (min) (ACUTE ONLY) 26 min  General Information  HPI 53 year old admitted with left facial droop, slurred speech. CT No acute cortically based infarct or acute intracranial hemorrhage identified. Chronic right PICA infarct appears stable. MRI pending. PMH: stoke, sleep apnea, MSTEMI, HTN, CHF.  Prior Functional Status  Cognitive/Linguistic Baseline Information not available (pt denies)  Type of Home Apartment  Education 8th  Vocation On disability  Pain Assessment  Pain Assessment Faces  Faces Pain Scale 0  Oral Motor/Sensory Function  Overall Oral Motor/Sensory Function Moderate impairment  Facial ROM Reduced left;Suspected CN VII (facial) dysfunction  Facial Symmetry Suspected CN VII (facial) dysfunction;Abnormal symmetry left  Facial Strength Reduced left;Suspected CN VII (facial) dysfunction  Cognition  Overall Cognitive Status Difficult to assess (suspect cog is close to baseline)  Arousal/Alertness Awake/alert  Orientation Level Oriented X4  Attention Sustained  Sustained Attention Appears intact  Memory Impaired  Memory Impairment Retrieval deficit  Awareness Impaired  Awareness Impairment Intellectual impairment  Problem Solving  (functional for basic)  Safety/Judgment Other (comment) (suspect functional)  Auditory Comprehension  Overall Auditory Comprehension Appears within functional limits for tasks assessed  Visual Recognition/Discrimination  Discrimination Not tested  Reading Comprehension  Reading Status Not tested  Expression  Primary Mode of Expression Verbal  Verbal Expression  Overall Verbal Expression Appears within functional limits for tasks assessed  Repetition  (NT)  Naming Not tested  Pragmatics No  impairment  Written Expression  Dominant Hand Right  Written Expression Not tested  Motor Speech  Overall Motor Speech Impaired  Respiration WFL  Phonation Normal  Resonance WFL  Articulation Impaired  Level of Impairment Sentence  Intelligibility Intelligibility reduced  Word 75-100% accurate  Phrase 75-100% accurate  Sentence 75-100% accurate  Conversation 75-100% accurate  Motor Planning Impaired  Level of Impairment Sentence  SLP - End of Session  Patient left in bed  Assessment  Clinical Impression Statement (ACUTE ONLY) Suspect pt's cognitive status and 16/30 score on the [facility] may not be far from his baseline (not confirmed). He reached 8th grade level in school and currently disabled from his job. He struggled with visuospatial/executive function subtest and was unable to perform one of three attention tasks. Recalled 1/5 words. Naming, digit recall for attention, language and orientation were WFL's. His speech is dysarthric in sentences with phonemic distortions. ST will follow while in acute care for dysarthria and primarily memory.     SLP Recommendation/Assessment Patient needs continued Speech Lanaguage Pathology Services  SLP Visit Diagnosis Dysarthria and anarthria (R47.1);Cognitive communication deficit (R41.841)  Problem List Other (comment);Memory (speech intelligibility)  Plan  Speech Therapy Frequency (ACUTE ONLY) min 2x/week  Duration 2 weeks  Treatment/Interventions Patient/family education;SLP instruction and feedback;Compensatory strategies  Potential to Achieve Goals (ACUTE ONLY) Good  SLP Recommendations  Follow up Recommendations None  SLP Equipment None recommended by SLP  Individuals Consulted  Consulted and Agree with Results and Recommendations Patient  SLP Evaluations  $ SLP Speech Visit 1 Visit  SLP Evaluations  $ SLP EVAL LANGUAGE/SOUND PRODUCTION 1 Procedure    Breck Coons Keiran Sias M.Ed Nurse, children's  [phone] Office [phone]

## 2019-04-23 NOTE — Progress Notes (Signed)
  Speech Language Pathology Treatment: Cognitive-Linquistic  Patient Details Name: Andrew Sparks MRN: [id number] DOB: [birth year] Today's Date: 04/23/2019 Time: 9741-6384 SLP Time Calculation (min) (ACUTE ONLY): 39 min  Assessment / Plan / Recommendation Clinical Impression  Pt was seen for treatment and he reported that he believes the dysarthria has resolved and his speech is back to baseline. He further stated that conversational partners have not been requesting clarification when he speaks and that his friends have commented on his improved speech intelligibility when he speaks on the phone. He required mod-max cues for time management problems. He achieved 100% accuracy with 5-item immediate recall but demonstrated 60% accuracy with recall of objective information from voice mails increasing to 100% accuracy with mod cues. He completed working memory tasks with 60% accuracy increasing to 100% accuracy with mod cues. SLP will continue to follow pt.    HPI HPI: Pt is a 53 year old admitted with left facial droop, slurred speech. CT No acute cortically based infarct or acute intracranial hemorrhage identified. Chronic right PICA infarct appears stable. MRI pending. PMH: stoke, sleep apnea, MSTEMI, HTN, CHF. Pt reported that he had a trach from 2015-2020 secondary to severe sleep apnea with unintentional decanulation approximately 3-4 months prior when he coughed.       SLP Plan  Continue with current plan of care       Recommendations                   Follow up Recommendations: None SLP Visit Diagnosis: Dysarthria and anarthria (R47.1);Cognitive communication deficit (R41.841) Plan: Continue with current plan of care       [name] I. Vear Clock, MS, CCC-SLP Acute Rehabilitation Services Office number [phone] Pager [phone]               [name] 04/23/2019, 4:06 PM

## 2019-04-24 ENCOUNTER — Other Ambulatory Visit: Payer: Self-pay | Admitting: Neurology

## 2019-04-24 DIAGNOSIS — R945 Abnormal results of liver function studies: Secondary | ICD-10-CM

## 2019-04-24 DIAGNOSIS — I63411 Cerebral infarction due to embolism of right middle cerebral artery: Secondary | ICD-10-CM

## 2019-04-24 DIAGNOSIS — R7989 Other specified abnormal findings of blood chemistry: Secondary | ICD-10-CM

## 2019-04-24 DIAGNOSIS — E785 Hyperlipidemia, unspecified: Secondary | ICD-10-CM

## 2019-04-24 LAB — BASIC METABOLIC PANEL
Anion gap: 11 (ref 5–15)
BUN: 24 mg/dL — ABNORMAL HIGH (ref 6–20)
CO2: 26 mmol/L (ref 22–32)
Calcium: 9 mg/dL (ref 8.9–10.3)
Chloride: 97 mmol/L — ABNORMAL LOW (ref 98–111)
Creatinine, Ser: 1.48 mg/dL — ABNORMAL HIGH (ref 0.61–1.24)
GFR calc Af Amer: >60 mL/min (ref >60)
GFR calc non Af Amer: 53 mL/min — ABNORMAL LOW (ref >60)
Glucose, Bld: 106 mg/dL — ABNORMAL HIGH (ref 70–99)
Potassium: 3.9 mmol/L (ref 3.5–5.1)
Sodium: 134 mmol/L — ABNORMAL LOW (ref 135–145)

## 2019-04-24 LAB — GLUCOSE, CAPILLARY
Glucose-Capillary: 96 mg/dL (ref 70–99)
Glucose-Capillary: 99 mg/dL (ref 70–99)
Glucose-Capillary: 87 mg/dL (ref 70–99)

## 2019-04-24 LAB — CBC
HCT: 44.9 % (ref 39.0–52.0)
Hemoglobin: 15.4 g/dL (ref 13.0–17.0)
MCH: 34 pg (ref 26.0–34.0)
MCHC: 34.3 g/dL (ref 30.0–36.0)
MCV: 99.1 fL (ref 80.0–100.0)
Platelets: 201 10*3/uL (ref 150–400)
RBC: 4.53 MIL/uL (ref 4.22–5.81)
RDW: 14.6 % (ref 11.5–15.5)
WBC: 5.2 10*3/uL (ref 4.0–10.5)
nRBC: 0 % (ref 0.0–0.2)

## 2019-04-24 MED ORDER — ADULT MULTIVITAMIN W/MINERALS CH: 1.0000 | ORAL_TABLET | Freq: Every day | ORAL | 2 refills | Status: DC

## 2019-04-24 MED ORDER — LISINOPRIL 20 MG PO TABS: 20.0000 mg | ORAL_TABLET | Freq: Every day | ORAL | Status: DC

## 2019-04-24 MED ORDER — SODIUM CHLORIDE 0.9 % IV SOLN: INTRAVENOUS | Status: DC

## 2019-04-24 MED ORDER — FOLIC ACID 1 MG PO TABS: 1.0000 mg | ORAL_TABLET | Freq: Every day | ORAL | 2 refills | Status: DC

## 2019-04-24 MED ORDER — NICOTINE 7 MG/24HR TD PT24: 7.0000 mg | MEDICATED_PATCH | Freq: Every day | TRANSDERMAL | 0 refills | Status: DC

## 2019-04-24 MED ORDER — THIAMINE HCL 100 MG PO TABS: 100.0000 mg | ORAL_TABLET | Freq: Every day | ORAL | 2 refills | Status: DC

## 2019-04-24 MED ORDER — METOPROLOL TARTRATE 75 MG PO TABS: 75.0000 mg | ORAL_TABLET | Freq: Two times a day (BID) | ORAL | 2 refills | Status: DC

## 2019-04-24 NOTE — Progress Notes (Signed)
  Speech Language Pathology Treatment: Cognitive-Linquistic  Patient Details Name: Andrew Sparks MRN: [id number] DOB: 1966-05-02 Today's Date: 04/24/2019 Time: 1428-1500 SLP Time Calculation (min) (ACUTE ONLY): 32 min  Assessment / Plan / Recommendation Clinical Impression  Pt was seen for cognitive-linguistic treatment and was cooperative throughout the session. He reported that he is excited about being able to go home today and is interested in continuing therapy with home health as is planned. He completed 2-item working memory tasks with 80% accuracy but demonstrated 50% accuracy with 3-item working memory task increasing to 100% accuracy with moderate cues. He required mod cues for time management problems and achieved 60% accuracy with money management problems increasing to 100% accuracy with min. cues. He completed an excutive function prescription task with 67% accuracy increasing to 100% accuracy with mod cues. Pt currently has discharge orders and will benefit from continued SLP services upon discharge.    HPI HPI: Pt is a 53 year old admitted with left facial droop, slurred speech. CT No acute cortically based infarct or acute intracranial hemorrhage identified. Chronic right PICA infarct appears stable. MRI pending. PMH: stoke, sleep apnea, MSTEMI, HTN, CHF. Pt reported that he had a trach from 2015-2020 secondary to severe sleep apnea with unintentional decanulation approximately 3-4 months prior when he coughed.       SLP Plan  Continue with current plan of care       Recommendations                   Follow up Recommendations: Home health SLP;24 hour supervision/assistance SLP Visit Diagnosis: Dysarthria and anarthria (R47.1);Cognitive communication deficit (R41.841) Plan: Continue with current plan of care       Artha Stavros I. Vear Clock, MS, CCC-SLP Acute Rehabilitation Services Office number [phone] Pager [phone]                [name] 04/24/2019, 3:11 PM

## 2019-04-24 NOTE — Plan of Care (Signed)
  Problem: Education: Goal: Knowledge of secondary prevention will improve Outcome: Progressing Goal: Knowledge of patient specific risk factors addressed and post discharge goals established will improve Outcome: Progressing Goal: Individualized Educational Video(s) Outcome: Progressing   Problem: Education: Goal: Knowledge of disease or condition will improve Outcome: Progressing Goal: Knowledge of secondary prevention will improve Outcome: Progressing Goal: Knowledge of patient specific risk factors addressed and post discharge goals established will improve Outcome: Progressing   Problem: Coping: Goal: Will verbalize positive feelings about self Outcome: Progressing Goal: Will identify appropriate support needs Outcome: Progressing   Problem: Health Behavior/Discharge Planning: Goal: Ability to manage health-related needs will improve Outcome: Progressing   Problem: Self-Care: Goal: Ability to participate in self-care as condition permits will improve Outcome: Progressing Goal: Verbalization of feelings and concerns over difficulty with self-care will improve Outcome: Progressing Goal: Ability to communicate needs accurately will improve Outcome: Progressing   Problem: Nutrition: Goal: Risk of aspiration will decrease Outcome: Progressing Goal: Dietary intake will improve Outcome: Progressing   Problem: Ischemic Stroke/TIA Tissue Perfusion: Goal: Complications of ischemic stroke/TIA will be minimized Outcome: Progressing   Problem: Education: Goal: Knowledge of General Education information will improve Description Including pain rating scale, medication(s)/side effects and non-pharmacologic comfort measures Outcome: Progressing   Problem: Health Behavior/Discharge Planning: Goal: Ability to manage health-related needs will improve Outcome: Progressing   Problem: Clinical Measurements: Goal: Ability to maintain clinical measurements within normal limits will  improve Outcome: Progressing Goal: Will remain free from infection Outcome: Progressing Goal: Diagnostic test results will improve Outcome: Progressing Goal: Respiratory complications will improve Outcome: Progressing Goal: Cardiovascular complication will be avoided Outcome: Progressing   Problem: Activity: Goal: Risk for activity intolerance will decrease Outcome: Progressing   Problem: Nutrition: Goal: Adequate nutrition will be maintained Outcome: Progressing   Problem: Coping: Goal: Level of anxiety will decrease Outcome: Progressing   Problem: Elimination: Goal: Will not experience complications related to bowel motility Outcome: Progressing Goal: Will not experience complications related to urinary retention Outcome: Progressing   Problem: Pain Managment: Goal: General experience of comfort will improve Outcome: Progressing   Problem: Safety: Goal: Ability to remain free from injury will improve Outcome: Progressing   Problem: Skin Integrity: Goal: Risk for impaired skin integrity will decrease Outcome: Progressing   Problem: Education: Goal: Knowledge of disease or condition will improve Outcome: Progressing Goal: Understanding of discharge needs will improve Outcome: Progressing   Problem: Health Behavior/Discharge Planning: Goal: Ability to identify changes in lifestyle to reduce recurrence of condition will improve Outcome: Progressing Goal: Identification of resources available to assist in meeting health care needs will improve Outcome: Progressing   Problem: Physical Regulation: Goal: Complications related to the disease process, condition or treatment will be avoided or minimized Outcome: Progressing   Problem: Safety: Goal: Ability to remain free from injury will improve Outcome: Progressing   Lincoln Brigham, BSN, RN

## 2019-04-24 NOTE — TOC Initial Note (Signed)
Transition of Care [facility]) - Initial/Assessment Note    Patient Details  Name: Andrew Sparks MRN: [id number] Date of Birth: 1966/11/08  Transition of Care [facility]) CM/SW Contact:    Kermit Balo, RN Phone Number: 04/24/2019, 12:22 PM  Clinical Narrative:                 Pt with old trach that he cares for at home. He has supplies through [employer].  States his girlfriend is with him most of the time and if needed his dad and sister can assist when she goes out.  Expected Discharge Plan: Home w Home Health Services Barriers to Discharge: Continued Medical Work up   Patient Goals and CMS Choice Patient states their goals for this hospitalization and ongoing recovery are:: to get home and have less anxiety CMS Medicare.gov Compare Post Acute Care list provided to:: Patient Choice offered to / list presented to : Patient  Expected Discharge Plan and Services Expected Discharge Plan: Home w Home Health Services   Discharge Planning Services: CM Consult Post Acute Care Choice: Home Health, Durable Medical Equipment Living arrangements for the past 2 months: Apartment(ground floor)                 DME Arranged: 3-N-1 DME Agency: [employer] Date DME Agency Contacted: 04/24/19 Time DME Agency Contacted: 2778 Representative spoke with at DME Agency: Zack HH Arranged: PT, OT, Speech Therapy HH Agency: [facility] Date HH Agency Contacted: 04/24/19 Time HH Agency Contacted: 1205 Representative spoke with at [facility] Agency: Lupita Leash  Prior Living Arrangements/Services Living arrangements for the past 2 months: Apartment(ground floor) Lives with:: Significant Other(girlfriend) Patient language and need for interpreter reviewed:: Yes(no needs) Do you feel safe going back to the place where you live?: Yes      Need for Family Participation in Patient Care: Yes (Comment) Care giver support system in place?: Yes (comment)(girlfriend/ dad/ sister) Current home  services: DME(oxygen through [employer] as needed/ suction machine) Criminal Activity/Legal Involvement Pertinent to Current Situation/Hospitalization: No - Comment as needed  Activities of Daily Living      Permission Sought/Granted                  Emotional Assessment Appearance:: Appears stated age Attitude/Demeanor/Rapport: Engaged Affect (typically observed): Accepting, Appropriate, Pleasant Orientation: : Oriented to Self, Oriented to Place, Oriented to  Time, Oriented to Situation   Psych Involvement: No (comment)  Admission diagnosis:  Ischemic stroke [facility]) [I63.9] Patient Active Problem List   Diagnosis Date Noted  . Ischemic stroke (HCC) 04/20/2019  . ARF (acute renal failure) (HCC) 03/26/2018  . CHF (congestive heart failure) (HCC) 07/17/2017  . 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 Panic disorder 01/18/2017  . Chest pain 01/17/2017  . Sleep apnea, obstructive 02/01/2016  . Tracheostomy care (HCC)   . 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 Debility 12/16/2014  . Obesity hypoventilation syndrome (HCC) 12/16/2014  . Encephalopathy pulmonary 12/15/2014   PCP:  [name], MD Pharmacy:   [facility] [address] [place], [place] - [address] [place] [address] Phone: [phone] Fax: [phone]     Social Determinants of Health (SDOH) Interventions    Readmission Risk Interventions No flowsheet data found.

## 2019-04-24 NOTE — TOC Transition Note (Signed)
Transition of Care [facility]) - CM/SW Discharge Note   Patient Details  Name: Andrew Sparks MRN: [id number] Date of Birth: 12/30/1965  Transition of Care [facility]) CM/SW Contact:  Kermit Balo, RN Phone Number: 04/24/2019, 12:24 PM   Clinical Narrative:    Pt discharging home today. Pt will need oxygen for transport home. Family unable to bring. CM called Zack with [employer] and they will bring oxygen tanks to room for transport home.  Pt states he only has issues with medications that are really expensive otherwise no medication problems.  Pt denies issues with transportation and has a ride home today.   Final next level of care: Home w Home Health Services Barriers to Discharge: Barriers Resolved   Patient Goals and CMS Choice Patient states their goals for this hospitalization and ongoing recovery are:: to get home and have less anxiety CMS Medicare.gov Compare Post Acute Care list provided to:: Patient Choice offered to / list presented to : Patient  Discharge Placement                       Discharge Plan and Services   Discharge Planning Services: CM Consult Post Acute Care Choice: Home Health, Durable Medical Equipment          DME Arranged: 3-N-1 DME Agency: [facility] Date DME Agency Contacted: 04/24/19 Time DME Agency Contacted: 1149 Representative spoke with at DME Agency: Zack HH Arranged: PT, OT, Speech Therapy HH Agency: [facility] Date HH Agency Contacted: 04/24/19 Time HH Agency Contacted: 1205 Representative spoke with at [facility] Agency: Lupita Leash  Social Determinants of Health (SDOH) Interventions     Readmission Risk Interventions No flowsheet data found.

## 2019-04-24 NOTE — Progress Notes (Signed)
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  Given pt's significant medical history[facility] . Pt in agreement.     Follow Up Recommendations  Home health OT;Supervision/Assistance - 24 hour(pt with multiple medical comorbidities therefore [facility]    Equipment Recommendations  3 in 1 bedside commode    Recommendations for Other Services      Precautions / Restrictions Precautions Precautions: Fall Restrictions Weight Bearing Restrictions: No       Mobility Bed Mobility                  Transfers                      Balance                                           ADL either performed or assessed with clinical judgement   ADL                                               Vision       Perception     Praxis      Cognition Arousal/Alertness: Awake/alert Behavior During Therapy: WFL for tasks assessed/performed Overall Cognitive Status: Within Functional Limits for tasks assessed                                          Exercises Other Exercises Other Exercises: Pt instructed and issued HEP for coordination as well as grip strength.  Pt able to return demonstrate putty exercises (tan) after instruction and practice.  Also educated pt on impact of impaired sensation on motor movement as well as grading movement. Practiced using vision to compensate for impaired sensation and function of L hand improves with this strategy. HEP provided in  writing and putty issued. [facility]   Shoulder Instructions       General Comments      Pertinent Vitals/ Pain       Pain Assessment: No/denies pain  Home Living                                          Prior Functioning/Environment              Frequency  Min 2X/week        Progress Toward Goals  OT Goals(current goals can now be found in the care plan section)  Progress towards OT goals: Progressing toward goals  Acute Rehab OT Goals Patient Stated Goal: regain independence OT Goal  Formulation: With patient Time For Goal Achievement: 05/06/19 Potential to Achieve Goals: Good  Plan      Co-evaluation          OT goals addressed during session: Strengthening/ROM      AM-PAC OT "6 Clicks" Daily Activity     Outcome Measure   Help from another person eating meals?: A Little(set up to cut up food) Help from another person taking care of personal grooming?: A Little Help from another person toileting, which includes using toliet, bedpan, or urinal?: A Little Help from another person bathing (including washing, rinsing, drying)?: A Little Help from another person to put on and taking off regular upper body clothing?: A Little Help from another person to put on and taking off regular lower body clothing?: A Lot 6 Click Score: 17    End of Session    OT Visit Diagnosis: Muscle weakness (generalized) (M62.81);Other symptoms and signs involving the nervous system (R29.898);Hemiplegia and hemiparesis Hemiplegia - Right/Left: Left Hemiplegia - dominant/non-dominant: Non-Dominant   Activity Tolerance Patient tolerated treatment well   Patient Left in chair;with call bell/phone within reach;with chair alarm set   Nurse Communication          Time: 0034-9179 OT Time Calculation (min): 22 min  Charges: OT General Charges $OT Visit: 1 Visit OT Treatments $Therapeutic Exercise: 8-22 mins     Norton Pastel, OTR/L 04/24/2019, 3:03 PM

## 2019-04-24 NOTE — Care Management Important Message (Signed)
Important Message  Patient Details  Name: Andrew Sparks MRN: [id number] Date of Birth: 1966/06/06   Medicare Important Message Given:  Yes    Yevonne Yokum 04/24/2019, 1:55 PM

## 2019-04-24 NOTE — Discharge Instructions (Signed)
Aspirin  daily starting tomorrow (Friday) for secondary stroke prevention.  Continue medications as prescribed. Please pick up your medication from your pharmacy.   Quit smoking   Limit alcohol drinking to 1-2 drinks per day if not able to quit  Follow-up Andrew Loveless, MD in one week for follow up and to repeat kidney function.  Follow-up in [facility] in 3-4 weeks and repeat CT head and decides on further medication change, office to schedule an appointment. Please call the clinic in 2 weeks if you do not hear anything from the clinic after discharge.  Check BP at home and long term BP goal < 140/90  Follow up with [facility] clinic for sleep apnea.      Ischemic Stroke  An ischemic stroke (cerebrovascular accident, or CVA) is the sudden death of brain tissue that occurs when an area of the brain does not get enough oxygen. It is a medical emergency that must be treated right away. An ischemic stroke can cause permanent loss of brain function. This can cause problems with how different parts of your body function. What are the causes? This condition is caused by a decrease of oxygen supply to an area of the brain, which may be the result of:  A small blood clot (embolus) or a buildup of plaque in the blood vessels (atherosclerosis) that blocks blood flow in the brain.  An abnormal heart rhythm (atrial fibrillation).  A blocked or damaged artery in the head or neck. Sometimes the cause of stroke is not known (cryptogenic). What increases the risk? Certain factors may make you more likely to develop this condition. Some of these factors are things that you can change, such as:  Obesity.  Smoking cigarettes.  Taking oral birth control, especially if you also use tobacco.  Physical inactivity.  Excessive alcohol use.  Use of illegal drugs, especially cocaine and methamphetamine. Other risk factors include:  High blood pressure  (hypertension).  High cholesterol.  Diabetes mellitus.  Heart disease.  Being [place] American, Native [address], Hispanic, or [place] Native.  Being over age 63.  Family history of stroke.  Previous history of blood clots, stroke, or transient ischemic attack (TIA).  Sickle cell disease.  Being a woman with a history of preeclampsia.  Migraine headache.  Sleep apnea.  Irregular heartbeats, such as atrial fibrillation.  Chronic inflammatory diseases, such as rheumatoid arthritis or lupus.  Blood clotting disorders (hypercoagulable state). What are the signs or symptoms? Symptoms of this condition usually develop suddenly, or you may notice them after waking up from sleep. Symptoms may include sudden:  Weakness or numbness in your face, arm, or leg, especially on one side of your body.  Trouble walking or difficulty moving your arms or legs.  Loss of balance or coordination.  Confusion.  Slurred speech (dysarthria).  Trouble speaking, understanding speech, or both (aphasia).  Vision changes--such as double vision, blurred vision, or loss of vision--in one or both eyes.  Dizziness.  Nausea and vomiting.  Severe headache with no known cause. The headache is often described as the worst headache ever experienced. If possible, make note of the exact time that you last felt like your normal self and what time your symptoms started. Tell your health care provider. If symptoms come and go, this could be a sign of a warning stroke, or TIA. Get help right away, even if you feel better. How is this diagnosed? This condition may be diagnosed based on:  Your  symptoms, your medical history, and a physical exam.  CT scan of the brain.  MRI.  CT angiogram. This test uses a computer to take X-rays of your arteries. A dye may be injected into your blood to show the inside of your blood vessels more clearly.  MRI angiogram. This is a type of MRI that is used to evaluate the  blood vessels.  Cerebral angiogram. This test uses X-rays and a dye to show the blood vessels in the brain and neck. You may need to see a health care provider who specializes in stroke care. A stroke specialist can be seen in person or through communication using telephone or television technology (telemedicine). Other tests may also be done to find the cause of the stroke, such as:  Electrocardiogram (ECG).  Continuous heart monitoring.  Echocardiogram.  Transesophageal echocardiogram (TEE).  Carotid ultrasound.  A scan of the brain circulation.  Blood tests.  Sleep study to check for sleep apnea. How is this treated? Treatment for this condition will depend on the duration, severity, and cause of your symptoms and on the area of the brain affected. It is very important to get treatment at the first sign of stroke symptoms. Some treatments work better if they are done within 3-6 hours of the onset of stroke symptoms. These initial treatments may include:  Aspirin.  Medicines to control blood pressure.  Medicine given by injection to dissolve the blood clot (thrombolytic).  Treatments given directly to the affected artery to remove or dissolve the blood clot. Other treatment options may include:  Oxygen.  IV fluids.  Medicines to thin the blood (anticoagulants or antiplatelets).  Procedures to increase blood flow. Medicines and changes to your diet may be used to help treat and manage risk factors for stroke, such as diabetes, high cholesterol, and high blood pressure. After a stroke, you may work with physical, speech, mental health, or occupational therapists to help you recover. Follow these instructions at home: Medicines  Take over-the-counter and prescription medicines only as told by your health care provider.  If you were told to take a medicine to thin your blood, such as aspirin or an anticoagulant, take it exactly as told by your health care  provider. ? Taking too much blood-thinning medicine can cause bleeding. ? If you do not take enough blood-thinning medicine, you will not have the protection that you need against another stroke and other problems.  Understand the side effects of taking anticoagulant medicine. When taking this type of medicine, make sure you: ? Hold pressure over any cuts for longer than usual. ? Tell your dentist and other health care providers that you are taking anticoagulants before you have any procedures that may cause bleeding. ? Avoid activities that may cause trauma or injury. Eating and drinking  Follow instructions from your health care provider about diet.  Eat healthy foods.  If your ability to swallow was affected by the stroke, you may need to take steps to avoid choking, such as: ? Taking small bites when eating. ? Eating foods that are soft or pureed. Safety  Follow instructions from your health care team about physical activity.  Use a walker or cane as told by your health care provider.  Take steps to create a safe home environment in order to reduce the risk of falls. This may include: ? Having your home looked at by specialists. ? Installing grab bars in the bedroom and bathroom. ? Using safety equipment, such as raised toilets and  a seat in the shower. General instructions  Do not use any tobacco products, such as cigarettes, chewing tobacco, and e-cigarettes. If you need help quitting, ask your health care provider.  Limit alcohol intake to no more than 1 drink a day for nonpregnant women and 2 drinks a day for men. One drink equals 12 oz of beer, 5 oz of wine, or 1 oz of hard liquor.  If you need help to stop using drugs or alcohol, ask your health care provider about a referral to a program or specialist.  Maintain an active and healthy lifestyle. Get regular exercise as told by your health care provider.  Keep all follow-up visits as told by your health care provider,  including visits with all specialists on your health care team. This is important. How is this prevented? Your risk of another stroke can be decreased by managing high blood pressure, high cholesterol, diabetes, heart disease, sleep apnea, and obesity. It can also be decreased by quitting smoking, limiting alcohol, and staying physically active. Your health care provider will continue to work with you on measures to prevent short-term and long-term complications of stroke. Get help right away if:   You have any symptoms of a stroke. "BE FAST" is an easy way to remember the main warning signs of a stroke: ? B - Balance. Signs are dizziness, sudden trouble walking, or loss of balance. ? E - Eyes. Signs are trouble seeing or a sudden change in vision. ? F - Face. Signs are sudden weakness or numbness of the face, or the face or eyelid drooping on one side. ? A - Arms. Signs are weakness or numbness in an arm. This happens suddenly and usually on one side of the body. ? S - Speech. Signs are sudden trouble speaking, slurred speech, or trouble understanding what people say. ? T - Time. Time to call emergency services. Write down what time symptoms started.  You have other signs of a stroke, such as: ? A sudden, severe headache with no known cause. ? Nausea or vomiting. ? Seizure.  These symptoms may represent a serious problem that is an emergency. Do not wait to see if the symptoms will go away. Get medical help right away. Call your local emergency services (911 in the [place] Do not drive yourself to the hospital. Summary  An ischemic stroke (cerebrovascular accident, or CVA) is the sudden death of brain tissue that occurs when an area of the brain does not get enough oxygen.  Symptoms of this condition usually develop suddenly, or you may notice them after waking up from sleep.  It is very important to get treatment at the first sign of stroke symptoms. Stroke is a medical emergency that  must be treated right away. This information is not intended to replace advice given to you by your health care provider. Make sure you discuss any questions you have with your health care provider. Document Released: 11/20/2005 Document Revised: 08/09/2018 Document Reviewed: 02/16/2016 Elsevier Interactive Patient Education  2019 [employer]    Stroke Prevention Some medical conditions and lifestyle choices can lead to a higher risk for a stroke. You can help to prevent a stroke by making nutrition, lifestyle, and other changes. What nutrition changes can be made?   Eat healthy foods. ? Choose foods that are high in fiber. These include:  Fresh fruits.  Fresh vegetables.  Whole grains. ? Eat at least 5 or more servings of fruits and vegetables each day. Try to  fill half of your plate at each meal with fruits and vegetables. ? Choose lean protein foods. These include:  Lowfat (lean) cuts of meat.  Chicken without skin.  Fish.  Tofu.  Beans.  Nuts. ? Eat low-fat dairy products. ? Avoid foods that:  Are high in salt (sodium).  Have saturated fat.  Have trans fat.  Have cholesterol.  Are processed.  Are premade.  Follow eating guidelines as told by your doctor. These may include: ? Reducing how many calories you eat and drink each day. ? Limiting how much salt you eat or drink each day to 1,500 milligrams (mg). ? Using only healthy fats for cooking. These include:  Olive oil.  Canola oil.  Sunflower oil. ? Counting how many carbohydrates you eat and drink each day. What lifestyle changes can be made?  Try to stay at a healthy weight. Talk to your doctor about what a good weight is for you.  Get at least 30 minutes of moderate physical activity at least 5 days a week. This can include: ? Fast walking. ? Biking. ? Swimming.  Do not use any products that have nicotine or tobacco. This includes cigarettes and e-cigarettes. If you need help quitting,  ask your doctor. Avoid being around tobacco smoke in general.  Limit how much alcohol you drink to no more than 1 drink a day for nonpregnant women and 2 drinks a day for men. One drink equals 12 oz of beer, 5 oz of wine, or 1 oz of hard liquor.  Do not use drugs.  Avoid taking birth control pills. Talk to your doctor about the risks of taking birth control pills if: ? You are over 73 years old. ? You smoke. ? You get migraines. ? You have had a blood clot. What other changes can be made?  Manage your cholesterol. ? It is important to eat a healthy diet. ? If your cholesterol cannot be managed through your diet, you may also need to take medicines. Take medicines as told by your doctor.  Manage your diabetes. ? It is important to eat a healthy diet and to exercise regularly. ? If your blood sugar cannot be managed through diet and exercise, you may need to take medicines. Take medicines as told by your doctor.  Control your high blood pressure (hypertension). ? Try to keep your blood pressure below 130/80. This can help lower your risk of stroke. ? It is important to eat a healthy diet and to exercise regularly. ? If your blood pressure cannot be managed through diet and exercise, you may need to take medicines. Take medicines as told by your doctor. ? Ask your doctor if you should check your blood pressure at home. ? Have your blood pressure checked every year. Do this even if your blood pressure is normal.  Talk to your doctor about getting checked for a sleep disorder. Signs of this can include: ? Snoring a lot. ? Feeling very tired.  Take over-the-counter and prescription medicines only as told by your doctor. These may include aspirin or blood thinners (antiplatelets or anticoagulants).  Make sure that any other medical conditions you have are managed. Where to find more information  American Stroke Association: www.strokeassociation.org  National Stroke Association:  www.stroke.org Get help right away if:  You have any symptoms of stroke. "BE FAST" is an easy way to remember the main warning signs: ? B - Balance. Signs are dizziness, sudden trouble walking, or loss of balance. ? E -  Eyes. Signs are trouble seeing or a sudden change in how you see. ? F - Face. Signs are sudden weakness or loss of feeling of the face, or the face or eyelid drooping on one side. ? A - Arms. Signs are weakness or loss of feeling in an arm. This happens suddenly and usually on one side of the body. ? S - Speech. Signs are sudden trouble speaking, slurred speech, or trouble understanding what people say. ? T - Time. Time to call emergency services. Write down what time symptoms started.  You have other signs of stroke, such as: ? A sudden, very bad headache with no known cause. ? Feeling sick to your stomach (nausea). ? Throwing up (vomiting). ? Jerky movements you cannot control (seizure). These symptoms may represent a serious problem that is an emergency. Do not wait to see if the symptoms will go away. Get medical help right away. Call your local emergency services (911 in the [place] Do not drive yourself to the hospital. Summary  You can prevent a stroke by eating healthy, exercising, not smoking, drinking less alcohol, and treating other health problems, such as diabetes, high blood pressure, or high cholesterol.  Do not use any products that contain nicotine or tobacco, such as cigarettes and e-cigarettes.  Get help right away if you have any signs or symptoms of a stroke. This information is not intended to replace advice given to you by your health care provider. Make sure you discuss any questions you have with your health care provider. Document Released: 05/21/2012 Document Revised: 02/21/2017 Document Reviewed: 02/21/2017 Elsevier Interactive Patient Education  2019 [employer].

## 2019-04-24 NOTE — Progress Notes (Signed)
CSW acknowledging consult for substance abuse resources. CSW also alerted by [facility] that patient was requesting information on counseling resources in the [facility] area. CSW met with patient and reviewed resources, indicated which agencies were still accepting patients and offering telehealth services. Patient agreed to contact them to set up appointments when he returns home.  CSW signing off.  Laveda Abbe, [place] Clinical Social Worker [phone]

## 2019-04-24 NOTE — Discharge Summary (Addendum)
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  Active Problems:  Uncontrolled HTN  Hypertensive emergency  afib RVR  Smoker  Alcohol abuse  obesity  History of stroke  Elevated creatinine - 1.48 -> recheck outpatient  Hyponateremia  Elevated LFT  Past Medical History:  Diagnosis Date  . A-fib (HCC)   . Anxiety   . CHF (congestive heart failure) (HCC)   . Depression   . Hypersomnia with sleep apnea   . Hypertension   . Morbid obesity (HCC)   . NSTEMI (non-ST elevated myocardial infarction) (HCC) 2005  . Obesity hypoventilation syndrome (HCC)   . Shortness of breath dyspnea   . Sleep apnea   . Stroke (HCC)   . V-tach [facility])    Past Surgical History:  Procedure Laterality Date  . LEFT HEART CATH AND CORONARY ANGIOGRAPHY Right 09/10/2018   Procedure: LEFT HEART CATH AND CORONARY ANGIOGRAPHY with possible percutaneous intervention;  Surgeon: Laurier Nancy, MD;  Location: [facility] INVASIVE CV LAB;  Service: Cardiovascular;  Laterality: Right;  . TRACHEOSTOMY      Family History Family History  Problem Relation Age of Onset  . Heart disease Father   . Diabetes type II Mother   . Breast cancer Mother     Social History  reports that he has been smoking cigarettes. He has a 14.00 pack-year smoking history. He has never used smokeless tobacco. He reports current alcohol use of about 8.0 standard drinks of alcohol per week. He reports that he does not use drugs.  Allergies as of 04/24/2019   No Known Allergies     Medication List    STOP taking these medications   ALPRAZolam 0.25 MG tablet Commonly known as:  Xanax   cloNIDine 0.3 MG tablet Commonly  known as:  CATAPRES   hydrochlorothiazide 25 MG tablet Commonly known as:  HYDRODIURIL   metoprolol succinate 50 MG 24 hr tablet Commonly known as:  TOPROL-XL   rivaroxaban 20 MG Tabs tablet Commonly known as:  XARELTO     TAKE these medications   albuterol 108 (90 Base) MCG/ACT inhaler Commonly known as:  VENTOLIN HFA Inhale 2 puffs into the lungs every 6 (six) hours as needed for wheezing or shortness of breath.   amiodarone 200 MG tablet Commonly known as:  Pacerone Take 2 tablets (400 mg total) by mouth 2 (two) times daily. What changed:    how much to take  when to take this  additional instructions   aspirin EC 81 MG tablet Take 1 tablet (81 mg total) by mouth daily.   FLUoxetine 20 MG capsule Commonly known as:  PROzac Take 1 capsule (20 mg total) by mouth daily.   folic acid 1 MG tablet Commonly known as:  FOLVITE Take 1 tablet (1 mg total) by mouth daily. Start taking on:  Apr 25, 2019   furosemide 40 MG tablet Commonly known as:  LASIX Take 1 tablet (40 mg total) by mouth daily.   hydrALAZINE 100 MG tablet Commonly known as:  APRESOLINE Take 1 tablet (100 mg total) by mouth 3 (three) times daily.   lisinopril 20 MG tablet Commonly known as:  ZESTRIL Take 20 mg by mouth daily.   Metoprolol Tartrate 75 MG Tabs Take 75 mg by mouth 2 (two) times daily. What changed:    medication strength  how much to take   multivitamin with minerals Tabs tablet Take 1 tablet by mouth daily. Start taking on:  Apr 25, 2019   nicotine 7 mg/24hr patch Commonly known as:  NICODERM CQ - dosed in mg/24 hr Place 1 patch (7 mg total) onto the skin daily. Start taking on:  Apr 25, 2019   OXYGEN Inhale 2-3 L into the lungs as needed (shortness of breath).   thiamine 100 MG tablet Take 1 tablet (100 mg total) by mouth daily. Start taking on:  Apr 25, 2019            Durable Medical Equipment  (From admission, onward)         Start     Ordered    04/24/19 1151  For home use only DME 3 n 1  Once     04/24/19 1150          HOME MEDICATIONS PRIOR TO ADMISSION Medications Prior to Admission  Medication Sig Dispense Refill  . albuterol (PROVENTIL HFA;VENTOLIN HFA) 108 (90 Base) MCG/ACT inhaler Inhale 2 puffs into the lungs every 6 (six) hours as needed for wheezing or shortness of breath. 1 Inhaler 0  . amiodarone (PACERONE) 200 MG tablet Take 2 tablets (400 mg total) by mouth 2 (two) times daily. (Patient taking differently: Take 200-800 mg by mouth See admin instructions. Ordered 04/18/19: take 4 tablets (800 mg) by mouth daily for 1 week, then take 3 tablets (600 mg) daily for 1 week, then take 2 tablets (400 mg) daily for 1 week, then take 1 tablet (200 mg) daily for 1 week, then follow up with MD.) 28 tablet 0  . cloNIDine (CATAPRES) 0.3 MG tablet Take 0.3 mg by mouth 3 (three) times daily.    Marland Kitchen FLUoxetine (PROZAC) 20 MG capsule Take 1 capsule (20 mg total) by mouth daily. 30 capsule 0  . hydrALAZINE (APRESOLINE) 100 MG tablet Take 1 tablet (100 mg total) by mouth 3 (three) times daily. 90 tablet 1  . hydrochlorothiazide (HYDRODIURIL) 25 MG tablet Take 25 mg by mouth daily.     Marland Kitchen lisinopril (ZESTRIL) 20 MG tablet Take 20 mg by mouth daily.    . metoprolol succinate (TOPROL-XL) 50 MG 24 hr tablet Take 50 mg by mouth daily.    . OXYGEN Inhale 2-3 L into the lungs as needed (shortness of breath).    . ALPRAZolam (XANAX) 0.25 MG tablet Take 1 tablet (0.25 mg total) by mouth daily as needed for anxiety (panic attack). (Patient not taking: Reported on 04/21/2019) 20 tablet 0  . aspirin EC 81 MG tablet Take 1 tablet (81 mg total) by mouth daily. (Patient not taking: Reported on 04/21/2019)    . furosemide (LASIX) 40 MG tablet Take 1 tablet (40 mg total) by mouth daily. (Patient not taking: Reported on 04/21/2019) 30 tablet 0  . metoprolol tartrate (LOPRESSOR) 25 MG tablet Take 1 tablet (25 mg total) by mouth 2 (two) times daily. (Patient not  taking: Reported on 04/21/2019) 60 tablet 0  . rivaroxaban (XARELTO) 20 MG TABS tablet Take 1 tablet (20 mg total) by mouth daily with supper. (Patient not taking: Reported on 04/21/2019) 30 tablet 0     HOSPITAL MEDICATIONS . amiodarone  400 mg Oral BID  . chlorhexidine  15 mL Mouth Rinse BID  .  Chlorhexidine Gluconate Cloth  6 each Topical Daily  . clonazePAM  0.5 mg Oral BID  . FLUoxetine  20 mg Oral Daily  . folic acid  1 mg Oral Daily  . furosemide  40 mg Oral Daily  . hydrALAZINE  100 mg Oral TID  . [START ON 04/25/2019] lisinopril  20 mg Oral Daily  . mouth rinse  15 mL Mouth Rinse q12n4p  . metoprolol tartrate  75 mg Oral BID  . multivitamin with minerals  1 tablet Oral Daily  . nicotine  7 mg Transdermal Daily  . pantoprazole  40 mg Oral QHS  . sodium chloride flush  3 mL Intravenous Once  . thiamine  100 mg Oral Daily    LABORATORY STUDIES CBC    Component Value Date/Time   WBC 5.2 04/24/2019 0327   RBC 4.53 04/24/2019 0327   HGB 15.4 04/24/2019 0327   HGB 12.8 (L) 12/12/2014 0551   HCT 44.9 04/24/2019 0327   HCT 40.7 12/12/2014 0551   PLT 201 04/24/2019 0327   PLT 290 12/12/2014 0551   MCV 99.1 04/24/2019 0327   MCV 94 12/12/2014 0551   MCH 34.0 04/24/2019 0327   MCHC 34.3 04/24/2019 0327   RDW 14.6 04/24/2019 0327   RDW 14.1 12/12/2014 0551   LYMPHSABS 1.1 04/20/2019 2200   LYMPHSABS 1.2 12/12/2014 0551   MONOABS 0.9 04/20/2019 2200   MONOABS 0.8 12/12/2014 0551   EOSABS 0.2 04/20/2019 2200   EOSABS 0.4 12/12/2014 0551   BASOSABS 0.0 04/20/2019 2200   BASOSABS 0.0 12/12/2014 0551   CMP    Component Value Date/Time   NA 134 (L) 04/24/2019 0327   NA 139 12/11/2014 0530   K 3.9 04/24/2019 0327   K 3.5 12/11/2014 0530   CL 97 (L) 04/24/2019 0327   CL 100 12/11/2014 0530   CO2 26 04/24/2019 0327   CO2 31 12/11/2014 0530   GLUCOSE 106 (H) 04/24/2019 0327   GLUCOSE 95 12/11/2014 0530   BUN 24 (H) 04/24/2019 0327   BUN 16 12/11/2014 0530   CREATININE  1.48 (H) 04/24/2019 0327   CREATININE 0.74 12/11/2014 0530   CALCIUM 9.0 04/24/2019 0327   CALCIUM 9.6 12/11/2014 0530   PROT 6.8 04/20/2019 2200   PROT 7.2 11/27/2014 0404   ALBUMIN 3.6 04/20/2019 2200   ALBUMIN 2.3 (L) 11/27/2014 0404   AST 72 (H) 04/20/2019 2200   AST 20 11/27/2014 0404   ALT 71 (H) 04/20/2019 2200   ALT 28 11/27/2014 0404   ALKPHOS 87 04/20/2019 2200   ALKPHOS 87 11/27/2014 0404   BILITOT 1.7 (H) 04/20/2019 2200   BILITOT 0.4 11/27/2014 0404   GFRNONAA 53 (L) 04/24/2019 0327   GFRNONAA >60 12/11/2014 0530   GFRNONAA >60 05/17/2012 0254   GFRAA >60 04/24/2019 0327   GFRAA >60 12/11/2014 0530   GFRAA >60 05/17/2012 0254   COAGS Lab Results  Component Value Date   INR 1.1 04/20/2019   INR 0.99 09/10/2018   INR 2.48 (H) 12/23/2014   Lipid Panel    Component Value Date/Time   CHOL 155 04/21/2019 0500   CHOL 150 11/17/2014 0457   TRIG 52 04/21/2019 0500   TRIG 118 12/02/2014 0421   HDL 81 04/21/2019 0500   HDL 39 (L) 11/17/2014 0457   CHOLHDL 1.9 04/21/2019 0500   VLDL 10 04/21/2019 0500   VLDL 16 11/17/2014 0457   LDLCALC 64 04/21/2019 0500   LDLCALC 95 11/17/2014 0457   HgbA1C  Lab Results  Component Value Date   HGBA1C 5.1 04/21/2019   Urinalysis    Component Value Date/Time   COLORURINE YELLOW (A) 03/27/2018 0615   APPEARANCEUR CLEAR (A) 03/27/2018 0615   APPEARANCEUR Clear 12/11/2014 1147   LABSPEC 1.011 03/27/2018 0615   LABSPEC 1.020 12/11/2014 1147   PHURINE 5.0 03/27/2018 0615   GLUCOSEU NEGATIVE 03/27/2018 0615   GLUCOSEU Negative 12/11/2014 1147   HGBUR NEGATIVE 03/27/2018 0615   BILIRUBINUR NEGATIVE 03/27/2018 0615   BILIRUBINUR Negative 12/11/2014 1147   KETONESUR NEGATIVE 03/27/2018 0615   PROTEINUR NEGATIVE 03/27/2018 0615   NITRITE NEGATIVE 03/27/2018 0615   LEUKOCYTESUR NEGATIVE 03/27/2018 0615   LEUKOCYTESUR Negative 12/11/2014 1147   Urine Drug Screen     Component Value Date/Time   LABOPIA NEGATIVE 11/16/2014  2006   COCAINSCRNUR NEGATIVE 11/16/2014 2006   LABBENZ NEGATIVE 11/16/2014 2006   AMPHETMU NEGATIVE 11/16/2014 2006   THCU NEGATIVE 11/16/2014 2006   LABBARB NEGATIVE 11/16/2014 2006    Alcohol Level No results found for: ETH   SIGNIFICANT DIAGNOSTIC STUDIES  Ct Angio Head W Or Wo Contrast Ct Angio Neck W Or Wo Contrast 04/21/2019 IMPRESSION:  1. Patent carotid and vertebral arteries. No dissection, aneurysm, or hemodynamically significant stenosis utilizing NASCET criteria.  2. Patent anterior and posterior intracranial circulation. No large vessel occlusion, aneurysm, or significant stenosis.  3. Mild calcific atherosclerosis of the aorta, carotid systems, and vertebral arteries.  4. Enlarged main pulmonary artery compatible pulmonary artery hypertension.    Ct Head Wo Contrast 04/23/2019 IMPRESSION:  1. Unchanged extent of right MCA branch infarct with hemorrhagic conversion classified on prior brain MRI. No new abnormality.  2. Remote left thalamic and right inferior cerebellar infarcts.    Mr Brain Wo Contrast 04/21/2019 IMPRESSION:  1. Acute/early subacute infarction in the right lateral frontal lobe centered in the pre motor cortex measuring up to 5.1 cm, 28 cc.  Acute hemorrhage within the infarct, approximately 5.4 cc. Heidelberg calssification 1c: PH1, hematoma within infarcted tissue, occupying <30%, no substantive mass effect.  2. Mild-to-moderate chronic microvascular ischemic changes and mild volume loss of the brain. Multiple small chronic infarctions in the right cerebellar hemisphere and bilateral thalami.  3. Multiple foci of chronic microhemorrhage in a central distribution likely due to sequelae of chronic hypertension.    Dg Chest Portable 1 View 03/11/2019 IMPRESSION: Stable cardiomegaly.  No edema or consolidation.    Ct Head Code Stroke Wo Contrast 04/20/2019 IMPRESSION:  1. No acute cortically based infarct or acute intracranial hemorrhage identified.  ASPECTS 10.  2. Progressed since 2009 and age indeterminate small vessel disease including in the left thalamus.  3. Chronic right PICA infarct appears stable.    TTE  1. The left ventricle has normal systolic function, with an ejection fraction of 55-60%. The cavity size was normal. There is severely increased left ventricular wall thickness. Left ventricular diastolic function could not be evaluated secondary to  atrial fibrillation. There is incoordinate septal motion. 2. Left atrial size was severely dilated. 3. Right atrial size was severely dilated. 4. The tricuspid valve is not well visualized. Tricuspid valve regurgitation was not assessed by color flow Doppler. 5. The aortic valve was not well visualized. Aortic valve regurgitation was not assessed by color flow Doppler. 6. There is moderate dilatation at the level of the sinuses of Valsalva measuring 47 mm. 7. The inferior vena cava was dilated in size with <50% respiratory variability. 8. The interatrial septum was not well visualized. 9. When compared to the  prior study: 09/30/2018: LVEF 65% with anteroseptal hypokinesis.   HISTORY OF PRESENT ILLNESS  Shantez Koker is a 53 y.o. 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  Ed course  On arrival patient had improved, NIH stroke scale was 2 for mild nasolabial fold flattening and slurred speech.  He had no drift on examination.  Blood pressure was extremely elevated, >200/140 mmHg. CT Head was not remarkable for stroke.  tPA was not administered as symptoms were too mild to treat.  CTA not performed immediately as clinically not LVO  At 10:30 PM .patient then  clinically worsened, now had drift in both left  arm and leg with significant ataxia on the left side and worsening facial droop. NIH stroke scale was 7. Called pharmacist to give tPA. Delay in TPA as patient required multiple doses of labetalol/Cleviprex infusion to bring blood pressure down.   tPA was administer at 22.53 with delay due to difficult to control blood pressure. After starting tPA., blood pressure transiently went down as low as 100/ 90 mmHg, however quickly went back up to 134/120 mmHg. Cleviprex was stopped and restarted multiple times during TPA administration with continued fluctuation in blood pressure.  Stroke symptoms worsened during this period, with patient being almost plegic at one point. Arterial line was placed in the emergency room and his blood pressure was titrated to upper limits post TPA parameters, patient started to improve.  Patient also was complaining of difficulty breathing and so EDP Dr. Rhunette Croft who recommended starting patient on BiPAP, however since COVID was not ruled out patient was not placed on it. Stat ABG was obtained, did not show hypercarbia and patient has been maintaining airway, although at times becomes apneic. Reassessed by EDP (Dr. Preston Fleeting as Dr. Rhunette Croft had completed shift) who felt patient no longer needed to be placed on BiPAP at the moment.  Course in ICU On arrival, NIH stroke scale 6.  Cleviprex restarted as blood pressure high.  Arterial line unfortunately infiltrates.  Blood pressure continues to fluctuate, requiring [place] to stop Cleviprex. Clinically worsens with blood pressure drops.  Date last known well: 5.17.20 Time last known well: 8.50 PM tPA Given: yes NIHSS: 2 >>> 7 at time of decision tPA administration Baseline MRS 0   HOSPITAL COURSE Mr. Haoyu Farnan is a 53 y.o. male with history of Afib not compliant with Xarelto, CHF, HTN, NSTEMI on ASA, obesity, OSA s/p trach with stroma, V-tach, and stroke admitted for left  sided weakness, left facial droop and slurry speech. TPA given due to worsening symptoms.    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  Mild to moderate small vessel disease and atrophy. Multiple chronic infarcts right cerebellar and bilateral thalami.  Multiple foci of chronic microhemorrhages.  CT head repeat showed stable hemorrhagic conversion  2D Echo  EF 55-60%. No embolus seen. Pt in AF   LDL 64  HgbA1c 5.1  SCDs for  VTE prophylaxis  aspirin 81 mg daily and Xarelto (rivaroxaban) daily (not compliant) prior to admission, now on no antithrombotic given hemorrhagic transformation. Will start ASA 81 tomorrow. Follow up with neurology in clinic to repeat CT, once hematoma resolves, consider DOAC for stroke prevention.   Therapy recommendations:  HH PT, OT and Speech therapies  Disposition:  discharge to home  Uncontrolled HTN Hypertensive emergency  Stable now  resumed lasix, increased metoprolol to 75mg  bid, increase hydralazine to 100mg  tid, resumed lisinopril 20   Continue lisinopril 20 daily given elevated Cre today  SBP goal < 160 given PH1 HT   Off cleviprex  Long term BP goal normotensive   Close monitoring at home  afib RVR  Not compliant with Xarelto prior to admission   ON amiodarone and metoprol  ON lasix  HR controlled  Tele monitoring  Not on antithrombotics now due to HT. Will start ASA 81 tomorrow. Follow up with neurology in clinic to repeat CT, once hematoma resolves, consider DOAC for stroke prevention.  Elevated Cre  Cre 1.23->1.30->1.06->1.05->1.48   ?? Due to fluid restriction  Decrease lisinopril to 20mg  daily  follow up  with PCP to repeat Cre in 5-7 days.  Hyponatremia  Fluid restriction 1500ml daily   Na 127->128->132->129->134   Follow up with PCP after discharge  Tobacco abuse  Current smoker  Smoking cessation counseling provided  Nicotine patch provided  Pt is willing to quit  Alcohol use  Put on CIWA protocol  Ativan PRN  On FA/B1/MVI  Will put on clonazepam 0.5mg  bid   Other Stroke Risk Factors  Obesity, Body mass index is 37.73 kg/m.   Hx stroke/TIA  Coronary artery disease  Obstructive sleep apnea s/p trach stroma on trach collar at night at home  Other Active Problems  Elevated LFT - AST/ALT 72/71   DISCHARGE EXAM Vitals:   04/24/19 0801 04/24/19 0835 04/24/19 1155 04/24/19 1156  BP:  (!) 129/100  (!) 135/99  Pulse:  75 67 70  Resp: 16 20 18 14   Temp:  98.7 F (37.1 C)  97.8 F (36.6 C)  TempSrc:  Oral  Oral  SpO2:  94% 98% 98%  Weight:      Height:       General - Well nourished, well developed, in no apparent distress.  Ophthalmologic - fundi not visualized due to noncooperation.  Cardiovascular - irregularly irregular heart rate and rhythm with intermittent RVR.  Mental Status -  Level of arousal and orientation to time, place, and person were intact. Language including expression, naming, repetition, comprehension was assessed and found intact. Fund of Knowledge was assessed and was intact.  Cranial Nerves II - XII - II - Visual field intact OU. III, IV, VI - Extraocular movements intact. V - Facial sensation intact bilaterally. VII - left nasolabial fold flattening. VIII - Hearing & vestibular intact bilaterally. X - Palate elevates symmetrically. XI - Chin turning & shoulder shrug intact bilaterally. XII - Tongue protrusion intact.  Motor Strength - The patient's strength was normal in all extremities and pronator drift was absent except 4/5 left hand grip left hand dexterity difficulty.  Bulk was normal and fasciculations were  absent.   Motor Tone - Muscle tone was assessed at the neck and appendages and was normal.  Reflexes - The patient's reflexes were symmetrical in all extremities and he had no pathological reflexes.  Sensory - Light touch, temperature/pinprick were assessed and were symmetrical.    Coordination - The patient had  normal movements in the right hands with no ataxia or dysmetria, but left FTN mild dysmetria and slow in action.  Tremor was absent.  Gait and Station - deferred.  Discharge Diet    Diet Order            Diet Heart Room service appropriate? Yes with Assist; Fluid consistency: Thin; Fluid restriction: 1500 mL Fluid  Diet effective now             liquids  DISCHARGE PLAN  Disposition:  Discharged to home  No antithrombotic for secondary stroke prevention secondary to hemorrhagic transformation. Start Aspirin 81 mg daily tomorrow, Friday 04/25/2019  Ongoing risk factor control by Primary Care Physician at time of discharge  Follow-up [name], MD in 1 weeks -> CMP next OV to check Na, LFTs, and creatinine.  Follow-up in [facility] in 4 weeks, office to schedule an appointment. CT Head next OV. once hematoma resolves, consider DOAC for stroke prevention.  45 minutes were spent preparing discharge.  Marvel Plan, MD PhD Stroke Neurology 04/24/2019 6:13 PM

## 2019-05-02 ENCOUNTER — Other Ambulatory Visit: Payer: Self-pay | Admitting: *Deleted

## 2019-05-02 NOTE — Patient Outreach (Signed)
  [facility]) Care Management  05/02/2019  Andrew Sparks 1966/01/07 [id number]   EMMI-stroke from [facility]   RED ON EMMI ALERT Day # 6 Date: Thursday  05/01/19 1301 Red Alert Reason: Questions/problems with meds? Yes Listened to these previous topics-Follow-up Smoked or been around smoke? Yes   Insurance:  Medicare  [facility] admissions x 1 ED visits x 1 in the last 6 months   Last admission on 04/20/19 to 04/24/19 for right lateral frontal lobe infarct s/p tPA with PH 1 hemorrhagic transformation   Outreach attempt # 1 No answer. [facility] RN CM left HIPAA compliant voicemail message along with CM's contact info.   Conditions: 04/20/19 right lateral frontal lobe infarct s/p tPA with PH 1 hemorrhagic transformation, uncontrolled HTN, hypertensive emergency, Afib RVR, smoker, alcohol abuse, obesity, hx of stroke , hyponatremia, elevated LFT, anxiety, depression (on Prozac), CHF, Hypersomnia with sleep apnea, morbid obesity, sob dyspnea, NSTEMI (2005) hx of V tach  Plan: [facility] RN CM sent an unsuccessful outreach letter and scheduled this patient for another call attempt within 4 business days   Hudson Majkowski L. Noelle Penner, RN, BSN, CCM [facility] Telephonic Care Management Care Coordinator Office number [phone] Mobile number [phone]  Main [facility] number [phone] Fax number [phone]

## 2019-05-05 ENCOUNTER — Other Ambulatory Visit: Payer: Self-pay | Admitting: *Deleted

## 2019-05-05 NOTE — Patient Outreach (Signed)
  [facility]) Care Management  05/05/2019  Andrew Sparks [birth year] [id number]   EMMI-stroke from [facility]   RED ON EMMI ALERT Day # 6 Date: Thursday  05/01/19 1301 Red Alert Reason: Questions/problems with meds? Yes  Listened to these previous topics-Follow-up Smoked or been around smoke? Yes  And   RED ON EMMI ALERT Day #9 Date: Sunday 05/04/19 1001  Red Alert Reason; Lost interest in things they used to enjoy? Yes  Sad, hopeless, anxious, or empty? Yes  Insurance:  Medicare  [facility] admissions x 1 ED visits x 1 in the last 6 months   Last admission on 04/20/19 to 04/24/19 for right lateral frontal lobe infarct s/p tPA with PH 1 hemorrhagic transformation   Outreach attempt # 2 No answer. [facility] RN CM left HIPAA compliant voicemail message along with CM's contact info at [phone] CM called [phone] and there is no answer nor availability to leave a message at this number [facility] RN CM spoke with Elvina Sidle and left a  HIPAA compliant voicemail message along with CM's contact info. She reports [name] is not good at returning calls She gave CM [name]'s girlfriend's [facility]) number as [phone].   [facility] RN CM spoke with [facility] and left a HIPAA compliant message along with CM's contact info with Maxine Glenn also    Conditions: 04/20/19 right lateral frontal lobe infarct s/p tPA with PH 1 hemorrhagic transformation, uncontrolled HTN, hypertensive emergency, Afib RVR, smoker, alcohol abuse, obesity, hx of stroke , hyponatremia, elevated LFT, anxiety, depression (on Prozac), CHF, Hypersomnia with sleep apnea, morbid obesity, sob dyspnea, NSTEMI (2005) hx of V tach  Plan: [facility] RN CM sent an unsuccessful outreach letter on 05/02/2019 [facility] RN CM left voice messages on 05/02/19 and 05/05/19 and scheduled this patient for a final call attempt within 4 business days   Routed note to MD  Cala Bradford L. Noelle Penner, RN, BSN, CCM [facility] Telephonic Care Management Care  Coordinator Office number [phone] Mobile number [phone]  Main [facility] number [phone] Fax number [phone]

## 2019-05-06 ENCOUNTER — Ambulatory Visit: Payer: Self-pay | Admitting: *Deleted

## 2019-05-07 ENCOUNTER — Other Ambulatory Visit: Payer: Self-pay | Admitting: *Deleted

## 2019-05-07 NOTE — Patient Outreach (Signed)
[facility]) Care Management  05/07/2019  Andrew Sparks [birth year] [id number]   [facility]  RED ON EMMI ALERT Day #6 Date:Thursday 05/01/19 1301 Red Alert Reason:Questions/problems with meds? Yes  Listened to these previous topics-Follow-up Smoked or been around smoke? Yes  And   RED ON EMMI ALERT Day #9 Date: Sunday 05/04/19 1001  Red Alert Reason; Lost interest in things they used to enjoy? Yes  Sad, hopeless, anxious, or empty? 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 [facility] RN CM was unable to leave HIPAA compliant voicemail messages along with CM's contact info at [phone] nor [phone] as the automated service states the subscriber is unable to be reached    Outreach attempt # 3 C No answer at Andrew Sparks's girlfriend's [facility]) number as [phone]. [facility] RN CM left a HIPAA compliant message along with CM's contact info   Call placed to [facility] and spoke with Lurena Joiner about the EMMI red alerts and the inability to make contact with Andrew Sparks. Lurena Joiner will share the discussed above EMMI red alerts with Dr Ellsworth Lennox as this is the primary MD and not Dr Welton Flakes. Lurena Joiner confirms Dr Welton Flakes is his cardiologist. This was updated in Epic.  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, uncontrolled HTN, hypertensive emergency, Afib RVR, smoker, alcohol abuse, obesity, hx of stroke , hyponatremia, elevated LFT, anxiety, depression (on Prozac), CHF, Hypersomnia with sleep apnea, morbid obesity, sob dyspnea, NSTEMI (2005) hx of V tach  Plan: [facility] RN CM scheduled this  patient for a case closure (per [facility] call attempt workflow) within 6 business days after contact made with MD office  [facility] RN CM sent an unsuccessful outreach letter on 05/02/2019 [facility] RN CM left voice messages on 05/02/19, 05/05/19 (for pt and support system) and 05/07/19 (for support system)  Routed note to MDs  Cala Bradford L. Noelle Penner, RN, BSN, CCM [facility] Telephonic Care Management Care Coordinator Office number [phone] Mobile number [phone]  Main [facility] number [phone] Fax number [phone]

## 2019-05-13 ENCOUNTER — Telehealth: Payer: Self-pay | Admitting: Adult Health

## 2019-05-13 NOTE — Telephone Encounter (Signed)
°  Due to current COVID 19 pandemic, our office is severely reducing in office visits until further notice, in order to minimize the risk to our patients and healthcare providers.    Called patient and scheduled a virtual visit with Andrew Sparks for 6/11. Patient verbalized understanding of the doxy.me process and I have sent an e-mail to [email or web address] with link and instructions as well as my name and our office number. Patient understands that they will receive a call from RN to update chart.   Pt understands that although there may be some limitations with this type of visit, we will take all precautions to reduce any security or privacy concerns.  Pt understands that this will be treated like an in office visit and we will file with pt's insurance, and there may be a patient responsible charge related to this service.

## 2019-05-15 ENCOUNTER — Other Ambulatory Visit: Payer: Self-pay

## 2019-05-15 ENCOUNTER — Ambulatory Visit: Payer: Medicare Other | Admitting: Adult Health

## 2019-05-15 NOTE — Progress Notes (Deleted)
[facility] [address] [place] [id number] [phone]       VIRTUAL VISIT FOLLOW UP NOTE  [name] Date of Birth:  [birth year] Medical Record Number:  [id number]   Reason for Referral:  hospital stroke follow up    Virtual Visit via Video Note  I connected with Andrew Sparks on 05/15/19 at  1:15 PM EDT by a video enabled telemedicine application located remotely in my own home and verified that I am speaking with the correct person using two identifiers who was located at their own home.   Visit scheduled by ***. She discussed the limitations of evaluation and management by telemedicine and the availability of in person appointments. The patient expressed understanding and agreed to proceed.Please see telephone note for additional scheduling information and consent.    CHIEF COMPLAINT:  No chief complaint on file.   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 fib noncompliant with AC on 04/20/2019 but due to COVID-19 safety precautions, visit transition to telemedicine via doxy.me with patients consent. History obtained from patient and chart review. Reviewed all radiology images and labs personally.  AndrewAndrew Sparks a 53 y.o.malewith history of Afib not compliant with Xarelto, CHF, HTN, NSTEMI on ASA, obesity, OSA s/p trach with stroma, V-tach, and stroke who presented on 04/20/2019 for left sided weakness, left facial droop and slurry speech.  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 fib not compliant with AC with residual left nasolabial fold flattening and left hand dexterity difficulty.  CT head negative for acute infarct but did show old left thalamic infarct.  CTA showed left PCA mild stenosis.   TPA administered without complication due to NIHSS 7.  MRI showed right lateral frontal lobe infarct with pH 1 hemorrhagic transformation, mild to moderate small vessel disease and atrophy, multiple chronic infarcts right cerebellar and bilateral thalami and multiple foci of chronic microhemorrhages.  Repeat CT head stable hemorrhagic conversion.  2D echo unremarkable without cardiac source of embolus but did show atrial fibrillation.  LDL 64 and A1c 5.1.  Previously on aspirin 81 mg and Xarelto but due to noncompliance, recommended restarting aspirin 81 mg and repeat CT head outpatient with potentially initiating DOAC if hematoma resolves.  Initially found to be in hypertensive emergency treated with Cleviprex and stabilized throughout admission.  Other stroke risk factors include tobacco use, alcohol use, obesity, prior history of stroke, CAD and OSA.  Hospital course complicated by elevated creatinine and hyponatremia and recommended follow-up with PCP at discharge.  He was discharged home in stable condition with recommendations of home therapy.  He has been stable from a stroke standpoint ***.    ROS:   14 system review of systems performed and negative with exception of ***  PMH:  Past Medical History:  Diagnosis Date  . A-fib (HCC)   . Anxiety   . CHF (congestive heart failure) (HCC)   . Depression   . Hypersomnia with sleep apnea   . Hypertension   . Morbid obesity (HCC)   . NSTEMI (non-ST elevated myocardial infarction) (HCC) 2005  . Obesity hypoventilation syndrome (HCC)   . Shortness of breath dyspnea   . Sleep apnea   . Stroke (HCC)   . V-tach [facility](HCC)     PSH:  Past Surgical History:  Procedure Laterality Date  . LEFT HEART CATH AND CORONARY ANGIOGRAPHY Right 09/10/2018   Procedure: LEFT HEART CATH AND CORONARY ANGIOGRAPHY with possible percutaneous intervention;  Surgeon: Dionisio David, MD;  Location: [facility];  Service: Cardiovascular;  Laterality: Right;  .  TRACHEOSTOMY      Social History:  Social History   Socioeconomic History  . Marital status: Single    Spouse name: Not on file  . Number of children: Not on file  . Years of education: Not on file  . Highest education level: Not on file  Occupational History  . Occupation: disabled  Social Needs  . Financial resource strain: Not on file  . Food insecurity    Worry: Not on file    Inability: Not on file  . Transportation needs    Medical: Not on file    Non-medical: Not on file  Tobacco Use  . Smoking status: Current Every Day Smoker    Packs/day: 0.50    Years: 28.00    Pack years: 14.00    Types: Cigarettes  . Smokeless tobacco: Never Used  Substance and Sexual Activity  . Alcohol use: Yes    Alcohol/week: 8.0 standard drinks    Types: 8 Cans of beer per week  . Drug use: No  . Sexual activity: Yes  Lifestyle  . Physical activity    Days per week: Not on file    Minutes per session: Not on file  . Stress: Not on file  Relationships  . Social Herbalist on phone: Not on file    Gets together: Not on file    Attends religious service: Not on file    Active member of club or organization: Not on file    Attends meetings of clubs or organizations: Not on file    Relationship status: Not on file  . Intimate partner violence    Fear of current or ex partner: Not on file    Emotionally abused: Not on file    Physically abused: Not on file    Forced sexual activity: Not on file  Other Topics Concern  . Not on file  Social History Narrative  . Not on file    Family History:  Family History  Problem Relation Age of Onset  . Heart disease Father   . Diabetes type II Mother   . Breast cancer Mother     Medications:   Current Outpatient Medications on File Prior to Visit  Medication Sig Dispense Refill  . albuterol (PROVENTIL HFA;VENTOLIN HFA) 108 (90 Base) MCG/ACT inhaler Inhale 2 puffs into the lungs every 6 (six) hours as needed for wheezing or  shortness of breath. 1 Inhaler 0  . amiodarone (PACERONE) 200 MG tablet Take 2 tablets (400 mg total) by mouth 2 (two) times daily. (Patient taking differently: Take 200-800 mg by mouth See admin instructions. Ordered 04/18/19: take 4 tablets (800 mg) by mouth daily for 1 week, then take 3 tablets (600 mg) daily for 1 week, then take 2 tablets (400 mg) daily for 1 week, then take 1 tablet (200 mg) daily for 1 week, then follow up with MD.) 28 tablet 0  . aspirin EC 81 MG tablet Take 1 tablet (81 mg total) by mouth daily. (Patient not taking: Reported on 04/21/2019)    . FLUoxetine (PROZAC) 20 MG capsule Take 1 capsule (20 mg total) by mouth daily. 30 capsule 0  . folic acid (FOLVITE)  1 MG tablet Take 1 tablet (1 mg total) by mouth daily. 30 tablet 2  . furosemide (LASIX) 40 MG tablet Take 1 tablet (40 mg total) by mouth daily. (Patient not taking: Reported on 04/21/2019) 30 tablet 0  . hydrALAZINE (APRESOLINE) 100 MG tablet Take 1 tablet (100 mg total) by mouth 3 (three) times daily. 90 tablet 1  . lisinopril (ZESTRIL) 20 MG tablet Take 20 mg by mouth daily.    . metoprolol tartrate 75 MG TABS Take 75 mg by mouth 2 (two) times daily. 60 tablet 2  . Multiple Vitamin (MULTIVITAMIN WITH MINERALS) TABS tablet Take 1 tablet by mouth daily. 30 tablet 2  . nicotine (NICODERM CQ - DOSED IN MG/24 HR) 7 mg/24hr patch Place 1 patch (7 mg total) onto the skin daily. 28 patch 0  . OXYGEN Inhale 2-3 L into the lungs as needed (shortness of breath).    . thiamine 100 MG tablet Take 1 tablet (100 mg total) by mouth daily. 30 tablet 2   No current facility-administered medications on file prior to visit.     Allergies:  No Known Allergies   Physical Exam  There were no vitals filed for this visit. There is no height or weight on file to calculate BMI. No exam data present  No flowsheet data found.   General: well developed, well nourished, seated, in no evident distress Head: head normocephalic and  atraumatic.     Neurologic Exam Mental Status: Awake and fully alert. Oriented to place and time. Recent and remote memory intact. Attention span, concentration and fund of knowledge appropriate. Mood and affect appropriate.  Cranial Nerves: Fundoscopic exam reveals sharp disc margins. Pupils equal, briskly reactive to light. Extraocular movements full without nystagmus. Visual fields full to confrontation. Hearing intact. Facial sensation intact. Face, tongue, palate moves normally and symmetrically.  Motor: Normal bulk and tone. Normal strength in all tested extremity muscles. Sensory.: intact to touch , pinprick , position and vibratory sensation.  Coordination: Rapid alternating movements normal in all extremities. Finger-to-nose and heel-to-shin performed accurately bilaterally. Gait and Station: Arises from chair without difficulty. Stance is normal. Gait demonstrates normal stride length and balance. Able to heel, toe and tandem walk without difficulty.  Reflexes: 1+ and symmetric. Toes downgoing.    NIHSS  *** Modified Rankin  *** CHA2DS2-VASc *** HAS-BLED ***   Diagnostic Data (Labs, Imaging, Testing)  CT HEAD WO CONTRAST ***  CT ANGIO HEAD W OR WO CONTRAST CT ANGIO NECK W OR WO CONTRAST ***  MR BRAIN WO CONTRAST ***  MR MRA HEAD  MR MRA NECK ***  ECHOCARDIOGRAM ***    ASSESSMENT: Andrew Sparks is a 53 y.o. year old male here with *** on *** secondary to ***. Vascular risk factors include ***.     PLAN:  1. *** : Continue {anticoagulants:31417}  and ***  for secondary stroke prevention. Maintain strict control of hypertension with blood pressure goal below 130/90, diabetes with hemoglobin A1c goal below 6.5% and cholesterol with LDL cholesterol (bad cholesterol) goal below 70 mg/dL.  I also advised the patient to eat a healthy diet with plenty of whole grains, cereals, fruits and vegetables, exercise regularly with at least 30 minutes of continuous  activity daily and maintain ideal body weight. 2. HTN: Advised to continue current treatment regimen.  Today's BP ***.  Advised to continue to monitor at home along with continued follow-up with PCP for management 3. HLD: Advised to continue current treatment regimen along with  continued follow-up with PCP for future prescribing and monitoring of lipid panel 4. DMII: Advised to continue to monitor glucose levels at home along with continued follow-up with PCP for management and monitoring    Follow up in *** or call earlier if needed   Greater than 50% of time during this 45 minute visit was spent on counseling, explanation of diagnosis of ***, reviewing risk factor management of ***, planning of further management along with potential future management, and discussion with patient and family answering all questions.    George HughJessica Isela Stantz, AGNP-BC  [facility] [address] [place] [address]  Phone [phone] Fax [phone] Note: This document was prepared with digital dictation and possible smart phrase technology. Any transcriptional errors that result from this process are unintentional.

## 2019-05-16 ENCOUNTER — Other Ambulatory Visit: Payer: Self-pay | Admitting: *Deleted

## 2019-05-16 NOTE — Patient Outreach (Signed)
[facility]) Care Management  05/16/2019  Andrew Sparks December 11, 1965 [id number]   Case closure   [facility] RN CM sent an unsuccessful outreach letteron 05/02/2019 [facility] RN CM left voice messages on 05/02/19, 05/05/19 (for pt and support system)and 05/07/19 (for support system)   Plan [facility] RN CM will close case after no response from patient within 10 business days. Unable to reach   Hughes. Lavina Hamman, RN, BSN, [place] Coordinator Office number [phone] Mobile number [phone]  Main [facility] number [phone] Fax number [phone]

## 2019-05-19 ENCOUNTER — Telehealth: Payer: Self-pay

## 2019-05-19 ENCOUNTER — Ambulatory Visit (INDEPENDENT_AMBULATORY_CARE_PROVIDER_SITE_OTHER): Payer: Medicare Other | Admitting: Adult Health

## 2019-05-19 ENCOUNTER — Other Ambulatory Visit: Payer: Self-pay

## 2019-05-19 ENCOUNTER — Encounter: Payer: Self-pay | Admitting: Adult Health

## 2019-05-19 DIAGNOSIS — I63411 Cerebral infarction due to embolism of right middle cerebral artery: Secondary | ICD-10-CM | POA: Diagnosis not present

## 2019-05-19 DIAGNOSIS — E785 Hyperlipidemia, unspecified: Secondary | ICD-10-CM

## 2019-05-19 DIAGNOSIS — I10 Essential (primary) hypertension: Secondary | ICD-10-CM | POA: Diagnosis not present

## 2019-05-19 DIAGNOSIS — G4733 Obstructive sleep apnea (adult) (pediatric): Secondary | ICD-10-CM

## 2019-05-19 DIAGNOSIS — I480 Paroxysmal atrial fibrillation: Secondary | ICD-10-CM

## 2019-05-19 NOTE — Progress Notes (Signed)
[facility] [address] [place]. [place] [id number] [phone]       VIRTUAL VISIT FOLLOW UP NOTE  [name] Date of Birth:  Apr 07, 1966 Medical Record Number:  [id number]   Reason for Referral:  hospital stroke follow up    Virtual Visit via Video Note  I connected with Andrew Sparks on 05/19/19 at 11:15 AM EDT by a video enabled telemedicine application located remotely in my own home and verified that I am speaking with the correct person using two identifiers who was located at their own home.   Visit scheduled by Retta DionesAlexandra Burch (referral department). She discussed the limitations of evaluation and management by telemedicine and the availability of in person appointments. The patient expressed understanding and agreed to proceed.Please see telephone note for additional scheduling information and consent.     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 fib noncompliant with AC on 04/20/2019 but due to COVID-19 safety precautions, visit transition to telemedicine via [email or web address] with patients consent. History obtained from patient and chart review. Reviewed all radiology images and labs personally.  Andrew Lealdore Asheris a 53 y.o.malewith history of Afib not compliant with Xarelto, CHF, HTN, NSTEMI on ASA, obesity, OSA s/p trach with stroma, V-tach, and stroke who presented on 04/20/2019 for left sided weakness, left facial droop and slurry speech.  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 fib not compliant with AC with residual left nasolabial fold flattening and left hand dexterity difficulty.  CT head negative for acute infarct but did show old left thalamic infarct.  CTA showed left PCA mild stenosis.  TPA administered  without complication due to NIHSS 7.  MRI showed right lateral frontal lobe infarct with pH 1 hemorrhagic transformation, mild to moderate small vessel disease and atrophy, multiple chronic infarcts right cerebellar and bilateral thalami and multiple foci of chronic microhemorrhages.  Repeat CT head stable hemorrhagic conversion.  2D echo unremarkable without cardiac source of embolus but did show atrial fibrillation.  LDL 64 and A1c 5.1.  Previously on aspirin 81 mg and Xarelto but due to noncompliance, recommended restarting aspirin 81 mg and repeat CT head outpatient with potentially initiating DOAC if hematoma resolves.  Initially found to be in hypertensive emergency treated with Cleviprex and stabilized throughout admission.  Other stroke risk factors include tobacco use, alcohol use, obesity, prior history of stroke, CAD and OSA.  Hospital course complicated by elevated creatinine and hyponatremia and recommended follow-up with PCP at discharge.  He was discharged home in stable condition with recommendations of home therapy.  He has been stable from a stroke standpoint with residual deficits increased salvation, left arm weakness, and left facial droop with improvement. He was evaluated by home therapies but was discharged as he recovered well and returned to all prior activities. He was advised to do exercises on his own at home. He ambulates without assistive device. He currently lives with his girl friend but able to maintain ADLs independently. He does endorse worsening anxiety and has appointment scheduled with psychiatry 05/26/19. He does have underlying depression/anxiety.  He continues on aspirin 81 mg without side effects of bleeding or bruising. He has recently restarted on Xarelto by PCP approx 2 weeks ago.  He denies worsening or new neurological symptoms or worsening in  headaches or bleeding or bruising.  He does endorse daily headaches which have been present since hospital admission. Denies  history of migraines or headaches but mother has hx of migraines. Headaches located occipital and top of head with pain scale at 4/10. Not debilitating and able to continue daily activity.  Typically worse in the AM upon awakening and will improve throughout the day.  Underlying history of OSA and plans on undergoing repeat sleep study as he is not currently compliant with CPAP.    ROS:   14 system review of systems performed and negative with exception of headaches, depression, weakness  PMH:  Past Medical History:  Diagnosis Date   A-fib ([place])    Anxiety    CHF (congestive heart failure) (HCC)    Depression    Hypersomnia with sleep apnea    Hypertension    Morbid obesity ([place])    NSTEMI (non-ST elevated myocardial infarction) ([place]) 2005   Obesity hypoventilation syndrome (HCC)    Shortness of breath dyspnea    Sleep apnea    Stroke ([place])    V-tach (HCC)     PSH:  Past Surgical History:  Procedure Laterality Date   LEFT HEART CATH AND CORONARY ANGIOGRAPHY Right 09/10/2018   Procedure: LEFT HEART CATH AND CORONARY ANGIOGRAPHY with possible percutaneous intervention;  Surgeon: Dionisio David, MD;  Location: [facility];  Service: Cardiovascular;  Laterality: Right;   TRACHEOSTOMY      Social History:  Social History   Socioeconomic History   Marital status: Single    Spouse name: Not on file   Number of children: Not on file   Years of education: Not on file   Highest education level: Not on file  Occupational History   Occupation: disabled  Social Designer, fashion/clothing strain: Not on file   Food insecurity    Worry: Not on file    Inability: Not on file   Transportation needs    Medical: Not on file    Non-medical: Not on file  Tobacco Use   Smoking status: Current Every Day Smoker    Packs/day: 0.50    Years: 28.00    Pack years: 14.00    Types: Cigarettes   Smokeless tobacco: Never Used  Substance and Sexual Activity     Alcohol use: Yes    Alcohol/week: 8.0 standard drinks    Types: 8 Cans of beer per week   Drug use: No   Sexual activity: Yes  Lifestyle   Physical activity    Days per week: Not on file    Minutes per session: Not on file   Stress: Not on file  Relationships   Social connections    Talks on phone: Not on file    Gets together: Not on file    Attends religious service: Not on file    Active member of club or organization: Not on file    Attends meetings of clubs or organizations: Not on file    Relationship status: Not on file   Intimate partner violence    Fear of current or ex partner: Not on file    Emotionally abused: Not on file    Physically abused: Not on file    Forced sexual activity: Not on file  Other Topics Concern   Not on file  Social History Narrative   Not on file    Family History:  Family History  Problem Relation Age of Onset   Heart disease  Father    Diabetes type II Mother    Breast cancer Mother     Medications:   Current Outpatient Medications on File Prior to Visit  Medication Sig Dispense Refill   albuterol (PROVENTIL HFA;VENTOLIN HFA) 108 (90 Base) MCG/ACT inhaler Inhale 2 puffs into the lungs every 6 (six) hours as needed for wheezing or shortness of breath. 1 Inhaler 0   amiodarone (PACERONE) 200 MG tablet Take 2 tablets (400 mg total) by mouth 2 (two) times daily. (Patient taking differently: Take 200-800 mg by mouth See admin instructions. Ordered 04/18/19: take 4 tablets (800 mg) by mouth daily for 1 week, then take 3 tablets (600 mg) daily for 1 week, then take 2 tablets (400 mg) daily for 1 week, then take 1 tablet (200 mg) daily for 1 week, then follow up with MD.) 28 tablet 0   aspirin EC 81 MG tablet Take 1 tablet (81 mg total) by mouth daily. (Patient not taking: Reported on 04/21/2019)     FLUoxetine (PROZAC) 20 MG capsule Take 1 capsule (20 mg total) by mouth daily. 30 capsule 0   folic acid (FOLVITE) 1 MG tablet  Take 1 tablet (1 mg total) by mouth daily. 30 tablet 2   furosemide (LASIX) 40 MG tablet Take 1 tablet (40 mg total) by mouth daily. (Patient not taking: Reported on 04/21/2019) 30 tablet 0   hydrALAZINE (APRESOLINE) 100 MG tablet Take 1 tablet (100 mg total) by mouth 3 (three) times daily. 90 tablet 1   lisinopril (ZESTRIL) 20 MG tablet Take 20 mg by mouth daily.     metoprolol tartrate 75 MG TABS Take 75 mg by mouth 2 (two) times daily. 60 tablet 2   Multiple Vitamin (MULTIVITAMIN WITH MINERALS) TABS tablet Take 1 tablet by mouth daily. 30 tablet 2   nicotine (NICODERM CQ - DOSED IN MG/24 HR) 7 mg/24hr patch Place 1 patch (7 mg total) onto the skin daily. 28 patch 0   OXYGEN Inhale 2-3 L into the lungs as needed (shortness of breath).     thiamine 100 MG tablet Take 1 tablet (100 mg total) by mouth daily. 30 tablet 2   No current facility-administered medications on file prior to visit.     Allergies:  No Known Allergies   Physical Exam  General: well developed, well nourished, pleasant middle-aged African-American male, seated, in no evident distress Head: head normocephalic and atraumatic.     Neurologic Exam Mental Status: Awake and fully alert. Oriented to place and time. Recent and remote memory intact. Attention span, concentration and fund of knowledge appropriate. Mood and affect appropriate.  Cranial Nerves: Extraocular movements full without nystagmus. Hearing intact to voice. Facial sensation intact. Left sided facial paralysis.  Motor: No evidence of large muscle extremity weakness project assessment.  Decreased left hand finger tapping Sensory.: intact to light touch Coordination: Rapid alternating movements normal in all extremities except for mildly decreased in left hand. Finger-to-nose and heel-to-shin performed accurately bilaterally. Gait and Station: Arises from chair without difficulty. Stance is normal. Gait demonstrates normal stride length and balance.  Able to heel, toe and tandem walk without difficulty.  Reflexes: UTA   NIHSS  1 Modified Rankin  1    Diagnostic Data (Labs, Imaging, Testing)  Ct Angio Head W Or Wo Contrast Ct Angio Neck W Or Wo Contrast 04/21/2019 IMPRESSION:  1. Patent carotid and vertebral arteries. No dissection, aneurysm, or hemodynamically significant stenosis utilizing NASCET criteria.  2. Patent anterior and posterior intracranial circulation.  No large vessel occlusion, aneurysm, or significant stenosis.  3. Mild calcific atherosclerosis of the aorta, carotid systems, and vertebral arteries.  4. Enlarged main pulmonary artery compatible pulmonary artery hypertension.    Ct Head Wo Contrast 04/23/2019 IMPRESSION:  1. Unchanged extent of right MCA branch infarct with hemorrhagic conversion classified on prior brain MRI. No new abnormality.  2. Remote left thalamic and right inferior cerebellar infarcts.    Mr Brain Wo Contrast 04/21/2019 IMPRESSION:  1. Acute/early subacute infarction in the right lateral frontal lobe centered in the pre motor cortex measuring up to 5.1 cm, 28 cc.  Acute hemorrhage within the infarct, approximately 5.4 cc. Heidelberg calssification 1c: PH1, hematoma within infarcted tissue, occupying <30%, no substantive mass effect.  2. Mild-to-moderate chronic microvascular ischemic changes and mild volume loss of the brain. Multiple small chronic infarctions in the right cerebellar hemisphere and bilateral thalami.  3. Multiple foci of chronic microhemorrhage in a central distribution likely due to sequelae of chronic hypertension.    Dg Chest Portable 1 View 03/11/2019 IMPRESSION: Stable cardiomegaly.  No edema or consolidation.    Ct Head Code Stroke Wo Contrast 04/20/2019 IMPRESSION:  1. No acute cortically based infarct or acute intracranial hemorrhage identified. ASPECTS 10.  2. Progressed since 2009 and age indeterminate small vessel disease including in the left  thalamus.  3. Chronic right PICA infarct appears stable.    TTE  1. The left ventricle has normal systolic function, with an ejection fraction of 55-60%. The cavity size was normal. There is severely increased left ventricular wall thickness. Left ventricular diastolic function could not be evaluated secondary to  atrial fibrillation. There is incoordinate septal motion. 2. Left atrial size was severely dilated. 3. Right atrial size was severely dilated. 4. The tricuspid valve is not well visualized. Tricuspid valve regurgitation was not assessed by color flow Doppler. 5. The aortic valve was not well visualized. Aortic valve regurgitation was not assessed by color flow Doppler. 6. There is moderate dilatation at the level of the sinuses of Valsalva measuring 47 mm. 7. The inferior vena cava was dilated in size with <50% respiratory variability. 8. The interatrial septum was not well visualized. 9. When compared to the prior study: 09/30/2018: LVEF 65% with anteroseptal hypokinesis.     ASSESSMENT: Andrew Sparks is a 53 y.o. 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 fib not compliant with AC. Vascular risk factors include A. fib, CHF, HTN, and STEMI on aspirin, obesity, OSA, V. tach and prior stroke.  Stable from a stroke standpoint with residual left upper extremity weakness and left facial droop    PLAN:  1. Cardioembolic infarct: Continue aspirin 81 mg daily and Xarelto (rivaroxaban) daily  and lipitor  for secondary stroke prevention. Maintain strict control of hypertension with blood pressure goal below 130/90, diabetes with hemoglobin A1c goal below 6.5% and cholesterol with LDL cholesterol (bad cholesterol) goal below 70 mg/dL.  I also advised the patient to eat a healthy diet with plenty of whole grains, cereals, fruits and vegetables, exercise regularly with at least 30  minutes of continuous activity daily and maintain ideal body weight. 2. HTN: Advised to continue current treatment regimen. Advised to continue to monitor at home along with continued follow-up with PCP for management 3. HLD: Advised to continue current treatment regimen along with continued follow-up with PCP for future prescribing and monitoring of lipid panel  4. Atrial fibrillation: Continue Xarelto and ongoing follow-up with PCP for management.  No indication at this time for repeat imaging as he has been stable with restarting Xarelto dosage 5. OSA: Untreated OSA likely contributing to morning headaches and highly encouraged following up with ENT provider to undergo repeat sleep study 6. Residual deficits: Ongoing exercises at home and notify office if interested in additional outpatient therapies 7. Depression/anxiety worsened since stroke: Follow-up with psychiatry as scheduled on 05/26/2019 for ongoing management     Follow up in 3 months or call earlier if needed   Greater than 50% of time during this 45 minute non-face-to-face visit was spent on counseling, explanation of diagnosis of cardioembolic infarct, reviewing risk factor management of HTN, HLD, atrial fibrillation, OSA, discussion regarding headaches and residual deficits, planning of further management along with potential future management, and discussion with patient and family answering all questions.    George Hugh, AGNP-BC  [facility] [address] [place], [place] [address]  Phone [phone] Fax [phone] Note: This document was prepared with digital dictation and possible smart phrase technology. Any transcriptional errors that result from this process are unintentional.

## 2019-05-19 NOTE — Telephone Encounter (Signed)
Left vm for patient to call back to update medications, pharmacy and allergies.

## 2019-05-19 NOTE — Progress Notes (Signed)
I agree with the above plan 

## 2019-07-22 ENCOUNTER — Other Ambulatory Visit: Payer: Self-pay

## 2019-07-22 NOTE — Patient Outreach (Signed)
Telephone outreach to patient to obtain mRs was successfully completed. mRs= 1. 

## 2019-08-20 ENCOUNTER — Ambulatory Visit: Payer: Medicare Other | Admitting: Adult Health

## 2019-08-20 ENCOUNTER — Encounter: Payer: Self-pay | Admitting: Adult Health

## 2019-08-20 ENCOUNTER — Telehealth: Payer: Self-pay

## 2019-08-20 NOTE — Telephone Encounter (Signed)
Patient was a no call/no show for their appointment today.   

## 2019-09-16 ENCOUNTER — Encounter: Payer: Self-pay | Admitting: Emergency Medicine

## 2019-09-16 ENCOUNTER — Emergency Department
Admission: EM | Admit: 2019-09-16 | Discharge: 2019-09-16 | Disposition: A | Discharge status: Home / Self Care | Payer: Medicare Other | Attending: Emergency Medicine | Admitting: Emergency Medicine

## 2019-09-16 ENCOUNTER — Other Ambulatory Visit: Payer: Self-pay

## 2019-09-16 ENCOUNTER — Emergency Department: Payer: Medicare Other

## 2019-09-16 DIAGNOSIS — Z7901 Long term (current) use of anticoagulants: Secondary | ICD-10-CM | POA: Insufficient documentation

## 2019-09-16 DIAGNOSIS — W3400XA Accidental discharge from unspecified firearms or gun, initial encounter: Secondary | ICD-10-CM | POA: Insufficient documentation

## 2019-09-16 DIAGNOSIS — Y999 Unspecified external cause status: Secondary | ICD-10-CM | POA: Insufficient documentation

## 2019-09-16 DIAGNOSIS — F17210 Nicotine dependence, cigarettes, uncomplicated: Secondary | ICD-10-CM | POA: Diagnosis not present

## 2019-09-16 DIAGNOSIS — Z79899 Other long term (current) drug therapy: Secondary | ICD-10-CM | POA: Diagnosis not present

## 2019-09-16 DIAGNOSIS — Y9389 Activity, other specified: Secondary | ICD-10-CM | POA: Insufficient documentation

## 2019-09-16 DIAGNOSIS — S71101A Unspecified open wound, right thigh, initial encounter: Secondary | ICD-10-CM | POA: Diagnosis present

## 2019-09-16 DIAGNOSIS — Y929 Unspecified place or not applicable: Secondary | ICD-10-CM | POA: Insufficient documentation

## 2019-09-16 LAB — BASIC METABOLIC PANEL
Anion gap: 10 (ref 5–15)
BUN: 18 mg/dL (ref 6–20)
CO2: 26 mmol/L (ref 22–32)
Calcium: 8.8 mg/dL — ABNORMAL LOW (ref 8.9–10.3)
Chloride: 94 mmol/L — ABNORMAL LOW (ref 98–111)
Creatinine, Ser: 1.04 mg/dL (ref 0.61–1.24)
GFR calc Af Amer: >60 mL/min (ref >60)
GFR calc non Af Amer: >60 mL/min (ref >60)
Glucose, Bld: 100 mg/dL — ABNORMAL HIGH (ref 70–99)
Potassium: 3.4 mmol/L — ABNORMAL LOW (ref 3.5–5.1)
Sodium: 130 mmol/L — ABNORMAL LOW (ref 135–145)

## 2019-09-16 LAB — CBC WITH DIFFERENTIAL/PLATELET
Abs Immature Granulocytes: 0.05 10*3/uL (ref 0.00–0.07)
Basophils Absolute: 0 10*3/uL (ref 0.0–0.1)
Basophils Relative: 0 %
Eosinophils Absolute: 0.8 10*3/uL — ABNORMAL HIGH (ref 0.0–0.5)
Eosinophils Relative: 11 %
HCT: 41.8 % (ref 39.0–52.0)
Hemoglobin: 14 g/dL (ref 13.0–17.0)
Immature Granulocytes: 1 %
Lymphocytes Relative: 19 %
Lymphs Abs: 1.4 10*3/uL (ref 0.7–4.0)
MCH: 32.5 pg (ref 26.0–34.0)
MCHC: 33.5 g/dL (ref 30.0–36.0)
MCV: 97 fL (ref 80.0–100.0)
Monocytes Absolute: 0.8 10*3/uL (ref 0.1–1.0)
Monocytes Relative: 11 %
Neutro Abs: 4.2 10*3/uL (ref 1.7–7.7)
Neutrophils Relative %: 58 %
Platelets: 158 10*3/uL (ref 150–400)
RBC: 4.31 MIL/uL (ref 4.22–5.81)
RDW: 13.3 % (ref 11.5–15.5)
WBC: 7.2 10*3/uL (ref 4.0–10.5)
nRBC: 0 % (ref 0.0–0.2)

## 2019-09-16 MED ORDER — OXYCODONE-ACETAMINOPHEN 5-325 MG PO TABS
1.0000 | ORAL_TABLET | Freq: Once | ORAL | Status: AC
  Administered 2019-09-16: 1 via ORAL
  Filled 2019-09-16: qty 1

## 2019-09-16 MED ORDER — OXYCODONE HCL 5 MG PO TABS: 2.5000 mg | ORAL_TABLET | Freq: Three times a day (TID) | ORAL | 0 refills | Status: AC | PRN

## 2019-09-16 MED ORDER — TETANUS-DIPHTH-ACELL PERTUSSIS 5-2.5-18.5 LF-MCG/0.5 IM SUSP
0.5000 mL | Freq: Once | INTRAMUSCULAR | Status: AC
  Administered 2019-09-16: 0.5 mL via INTRAMUSCULAR
  Filled 2019-09-16: qty 0.5
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
## 2019-09-16 NOTE — ED Notes (Signed)
Bandage applied to wounds x2.
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
## 2019-12-23 ENCOUNTER — Emergency Department
Admission: EM | Admit: 2019-12-23 | Discharge: 2019-12-23 | Disposition: A | Discharge status: Home / Self Care | Payer: Medicare Other | Attending: Emergency Medicine | Admitting: Emergency Medicine

## 2019-12-23 ENCOUNTER — Encounter: Payer: Self-pay | Admitting: Emergency Medicine

## 2019-12-23 ENCOUNTER — Other Ambulatory Visit: Payer: Self-pay

## 2019-12-23 DIAGNOSIS — I10 Essential (primary) hypertension: Secondary | ICD-10-CM | POA: Insufficient documentation

## 2019-12-23 DIAGNOSIS — T22222A Burn of second degree of left elbow, initial encounter: Secondary | ICD-10-CM | POA: Insufficient documentation

## 2019-12-23 DIAGNOSIS — W0110XA Fall on same level from slipping, tripping and stumbling with subsequent striking against unspecified object, initial encounter: Secondary | ICD-10-CM | POA: Insufficient documentation

## 2019-12-23 DIAGNOSIS — Y939 Activity, unspecified: Secondary | ICD-10-CM | POA: Insufficient documentation

## 2019-12-23 DIAGNOSIS — S59902A Unspecified injury of left elbow, initial encounter: Secondary | ICD-10-CM | POA: Diagnosis present

## 2019-12-23 DIAGNOSIS — I509 Heart failure, unspecified: Secondary | ICD-10-CM | POA: Insufficient documentation

## 2019-12-23 DIAGNOSIS — F17210 Nicotine dependence, cigarettes, uncomplicated: Secondary | ICD-10-CM | POA: Diagnosis not present

## 2019-12-23 DIAGNOSIS — Z7901 Long term (current) use of anticoagulants: Secondary | ICD-10-CM | POA: Diagnosis not present

## 2019-12-23 DIAGNOSIS — X19XXXA Contact with other heat and hot substances, initial encounter: Secondary | ICD-10-CM | POA: Insufficient documentation

## 2019-12-23 DIAGNOSIS — Z79899 Other long term (current) drug therapy: Secondary | ICD-10-CM | POA: Insufficient documentation

## 2019-12-23 DIAGNOSIS — Y999 Unspecified external cause status: Secondary | ICD-10-CM | POA: Insufficient documentation

## 2019-12-23 DIAGNOSIS — Y929 Unspecified place or not applicable: Secondary | ICD-10-CM | POA: Diagnosis not present

## 2019-12-23 DIAGNOSIS — T22121A Burn of first degree of right elbow, initial encounter: Secondary | ICD-10-CM

## 2019-12-23 MED ORDER — SILVER SULFADIAZINE 1 % EX CREA: TOPICAL_CREAM | Freq: Once | CUTANEOUS | Status: AC

## 2019-12-23 MED ORDER — SILVER SULFADIAZINE 1 % EX CREA: TOPICAL_CREAM | CUTANEOUS | 1 refills | Status: DC

## 2019-12-23 MED ORDER — SULFAMETHOXAZOLE-TRIMETHOPRIM 800-160 MG PO TABS: 1.0000 | ORAL_TABLET | Freq: Two times a day (BID) | ORAL | 0 refills | Status: DC

## 2019-12-23 MED ORDER — OXYCODONE-ACETAMINOPHEN 5-325 MG PO TABS: 1.0000 | ORAL_TABLET | Freq: Four times a day (QID) | ORAL | 0 refills | Status: AC | PRN
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
## 2019-12-23 NOTE — Discharge Instructions (Addendum)
Follow discharge care instruction.  Wear arm sling for 3 to 5 days as needed.  Take medication as directed.
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
## 2020-04-01 ENCOUNTER — Ambulatory Visit: Payer: Medicare Other

## 2020-04-03 ENCOUNTER — Ambulatory Visit: Payer: Medicare Other | Attending: Internal Medicine

## 2020-04-20 ENCOUNTER — Encounter: Payer: Self-pay | Admitting: Emergency Medicine

## 2020-04-20 ENCOUNTER — Emergency Department: Payer: Medicare Other

## 2020-04-20 ENCOUNTER — Other Ambulatory Visit: Payer: Self-pay

## 2020-04-20 ENCOUNTER — Inpatient Hospital Stay
Admit: 2020-04-20 | Discharge: 2020-04-20 | Disposition: A | Discharge status: Home / Self Care | Payer: Medicare Other | Attending: Internal Medicine | Admitting: Internal Medicine

## 2020-04-20 ENCOUNTER — Inpatient Hospital Stay
Admission: EM | Admit: 2020-04-20 | Discharge: 2020-04-24 | DRG: 640 | Disposition: A | Discharge status: Home / Self Care | Payer: Medicare Other | Attending: Internal Medicine | Admitting: Internal Medicine

## 2020-04-20 DIAGNOSIS — F1010 Alcohol abuse, uncomplicated: Secondary | ICD-10-CM | POA: Diagnosis present

## 2020-04-20 DIAGNOSIS — I130 Hypertensive heart and chronic kidney disease with heart failure and stage 1 through stage 4 chronic kidney disease, or unspecified chronic kidney disease: Secondary | ICD-10-CM | POA: Diagnosis present

## 2020-04-20 DIAGNOSIS — Z803 Family history of malignant neoplasm of breast: Secondary | ICD-10-CM

## 2020-04-20 DIAGNOSIS — E861 Hypovolemia: Secondary | ICD-10-CM | POA: Diagnosis present

## 2020-04-20 DIAGNOSIS — R531 Weakness: Secondary | ICD-10-CM

## 2020-04-20 DIAGNOSIS — N181 Chronic kidney disease, stage 1: Secondary | ICD-10-CM | POA: Diagnosis present

## 2020-04-20 DIAGNOSIS — I10 Essential (primary) hypertension: Secondary | ICD-10-CM | POA: Diagnosis not present

## 2020-04-20 DIAGNOSIS — F419 Anxiety disorder, unspecified: Secondary | ICD-10-CM | POA: Diagnosis present

## 2020-04-20 DIAGNOSIS — Z8249 Family history of ischemic heart disease and other diseases of the circulatory system: Secondary | ICD-10-CM

## 2020-04-20 DIAGNOSIS — F418 Other specified anxiety disorders: Secondary | ICD-10-CM | POA: Diagnosis not present

## 2020-04-20 DIAGNOSIS — I5033 Acute on chronic diastolic (congestive) heart failure: Secondary | ICD-10-CM | POA: Diagnosis present

## 2020-04-20 DIAGNOSIS — G4733 Obstructive sleep apnea (adult) (pediatric): Secondary | ICD-10-CM

## 2020-04-20 DIAGNOSIS — I482 Chronic atrial fibrillation, unspecified: Secondary | ICD-10-CM | POA: Diagnosis present

## 2020-04-20 DIAGNOSIS — I252 Old myocardial infarction: Secondary | ICD-10-CM | POA: Diagnosis not present

## 2020-04-20 DIAGNOSIS — J9611 Chronic respiratory failure with hypoxia: Secondary | ICD-10-CM | POA: Diagnosis present

## 2020-04-20 DIAGNOSIS — J441 Chronic obstructive pulmonary disease with (acute) exacerbation: Secondary | ICD-10-CM | POA: Insufficient documentation

## 2020-04-20 DIAGNOSIS — E871 Hypo-osmolality and hyponatremia: Principal | ICD-10-CM | POA: Diagnosis present

## 2020-04-20 DIAGNOSIS — Z720 Tobacco use: Secondary | ICD-10-CM | POA: Diagnosis not present

## 2020-04-20 DIAGNOSIS — Z6836 Body mass index (BMI) 36.0-36.9, adult: Secondary | ICD-10-CM | POA: Diagnosis not present

## 2020-04-20 DIAGNOSIS — D72819 Decreased white blood cell count, unspecified: Secondary | ICD-10-CM | POA: Diagnosis present

## 2020-04-20 DIAGNOSIS — Z20822 Contact with and (suspected) exposure to covid-19: Secondary | ICD-10-CM | POA: Diagnosis present

## 2020-04-20 DIAGNOSIS — I4510 Unspecified right bundle-branch block: Secondary | ICD-10-CM | POA: Diagnosis present

## 2020-04-20 DIAGNOSIS — I2510 Atherosclerotic heart disease of native coronary artery without angina pectoris: Secondary | ICD-10-CM | POA: Diagnosis present

## 2020-04-20 DIAGNOSIS — F329 Major depressive disorder, single episode, unspecified: Secondary | ICD-10-CM | POA: Diagnosis present

## 2020-04-20 DIAGNOSIS — I5032 Chronic diastolic (congestive) heart failure: Secondary | ICD-10-CM | POA: Diagnosis present

## 2020-04-20 DIAGNOSIS — F32A Depression, unspecified: Secondary | ICD-10-CM | POA: Insufficient documentation

## 2020-04-20 DIAGNOSIS — E662 Morbid (severe) obesity with alveolar hypoventilation: Secondary | ICD-10-CM | POA: Diagnosis present

## 2020-04-20 DIAGNOSIS — Z8673 Personal history of transient ischemic attack (TIA), and cerebral infarction without residual deficits: Secondary | ICD-10-CM

## 2020-04-20 DIAGNOSIS — Z9119 Patient's noncompliance with other medical treatment and regimen: Secondary | ICD-10-CM

## 2020-04-20 DIAGNOSIS — Z833 Family history of diabetes mellitus: Secondary | ICD-10-CM | POA: Diagnosis not present

## 2020-04-20 DIAGNOSIS — Z716 Tobacco abuse counseling: Secondary | ICD-10-CM | POA: Diagnosis not present

## 2020-04-20 DIAGNOSIS — I161 Hypertensive emergency: Secondary | ICD-10-CM | POA: Diagnosis present

## 2020-04-20 DIAGNOSIS — F17210 Nicotine dependence, cigarettes, uncomplicated: Secondary | ICD-10-CM | POA: Diagnosis present

## 2020-04-20 DIAGNOSIS — J90 Pleural effusion, not elsewhere classified: Secondary | ICD-10-CM

## 2020-04-20 DIAGNOSIS — I509 Heart failure, unspecified: Secondary | ICD-10-CM

## 2020-04-20 DIAGNOSIS — Z79899 Other long term (current) drug therapy: Secondary | ICD-10-CM

## 2020-04-20 LAB — URINALYSIS, COMPLETE (UACMP) WITH MICROSCOPIC
Bacteria, UA: NONE SEEN
Bilirubin Urine: NEGATIVE
Glucose, UA: NEGATIVE mg/dL
Ketones, ur: NEGATIVE mg/dL
Leukocytes,Ua: NEGATIVE
Nitrite: NEGATIVE
Protein, ur: >=300 mg/dL — AB
Specific Gravity, Urine: 1.01 (ref 1.005–1.030)
pH: 6 (ref 5.0–8.0)

## 2020-04-20 LAB — COMPREHENSIVE METABOLIC PANEL
ALT: 19 U/L (ref 0–44)
AST: 26 U/L (ref 15–41)
Albumin: 3.9 g/dL (ref 3.5–5.0)
Alkaline Phosphatase: 93 U/L (ref 38–126)
Anion gap: 10 (ref 5–15)
BUN: 6 mg/dL (ref 6–20)
CO2: 27 mmol/L (ref 22–32)
Calcium: 8.5 mg/dL — ABNORMAL LOW (ref 8.9–10.3)
Chloride: 76 mmol/L — ABNORMAL LOW (ref 98–111)
Creatinine, Ser: 0.55 mg/dL — ABNORMAL LOW (ref 0.61–1.24)
GFR calc Af Amer: >60 mL/min (ref >60)
GFR calc non Af Amer: >60 mL/min (ref >60)
Glucose, Bld: 87 mg/dL (ref 70–99)
Potassium: 4.4 mmol/L (ref 3.5–5.1)
Sodium: 113 mmol/L — CL (ref 135–145)
Total Bilirubin: 1.5 mg/dL — ABNORMAL HIGH (ref 0.3–1.2)
Total Protein: 7.7 g/dL (ref 6.5–8.1)

## 2020-04-20 LAB — CBC
HCT: 42.8 % (ref 39.0–52.0)
Hemoglobin: 15.1 g/dL (ref 13.0–17.0)
MCH: 32.6 pg (ref 26.0–34.0)
MCHC: 35.3 g/dL (ref 30.0–36.0)
MCV: 92.4 fL (ref 80.0–100.0)
Platelets: 193 10*3/uL (ref 150–400)
RBC: 4.63 MIL/uL (ref 4.22–5.81)
RDW: 12.7 % (ref 11.5–15.5)
WBC: 2.7 10*3/uL — ABNORMAL LOW (ref 4.0–10.5)
nRBC: 0 % (ref 0.0–0.2)

## 2020-04-20 LAB — SARS CORONAVIRUS 2 BY RT PCR (HOSPITAL ORDER, PERFORMED IN ~~LOC~~ HOSPITAL LAB): SARS Coronavirus 2: NEGATIVE

## 2020-04-20 LAB — BLOOD GAS, VENOUS
Acid-Base Excess: 1.2 mmol/L (ref 0.0–2.0)
Bicarbonate: 29 mmol/L — ABNORMAL HIGH (ref 20.0–28.0)
O2 Saturation: 60 %
Patient temperature: 37
pCO2, Ven: 59 mmHg (ref 44.0–60.0)
pH, Ven: 7.3 (ref 7.250–7.430)
pO2, Ven: 35 mmHg (ref 32.0–45.0)

## 2020-04-20 LAB — CBC WITH DIFFERENTIAL/PLATELET
Abs Immature Granulocytes: 0.01 10*3/uL (ref 0.00–0.07)
Basophils Absolute: 0 10*3/uL (ref 0.0–0.1)
Basophils Relative: 1 %
Eosinophils Absolute: 0.1 10*3/uL (ref 0.0–0.5)
Eosinophils Relative: 4 %
HCT: 41.9 % (ref 39.0–52.0)
Hemoglobin: 15.4 g/dL (ref 13.0–17.0)
Immature Granulocytes: 0 %
Lymphocytes Relative: 19 %
Lymphs Abs: 0.5 10*3/uL — ABNORMAL LOW (ref 0.7–4.0)
MCH: 33.2 pg (ref 26.0–34.0)
MCHC: 36.8 g/dL — ABNORMAL HIGH (ref 30.0–36.0)
MCV: 90.3 fL (ref 80.0–100.0)
Monocytes Absolute: 0.7 10*3/uL (ref 0.1–1.0)
Monocytes Relative: 26 %
Neutro Abs: 1.4 10*3/uL — ABNORMAL LOW (ref 1.7–7.7)
Neutrophils Relative %: 50 %
Platelets: 206 10*3/uL (ref 150–400)
RBC: 4.64 MIL/uL (ref 4.22–5.81)
RDW: 12.7 % (ref 11.5–15.5)
WBC: 2.8 10*3/uL — ABNORMAL LOW (ref 4.0–10.5)
nRBC: 0 % (ref 0.0–0.2)

## 2020-04-20 LAB — TROPONIN I (HIGH SENSITIVITY): Troponin I (High Sensitivity): 13 ng/L (ref <18)

## 2020-04-20 LAB — GLUCOSE, CAPILLARY: Glucose-Capillary: 85 mg/dL (ref 70–99)

## 2020-04-20 MED ORDER — LORAZEPAM 2 MG/ML IJ SOLN: 1.0000 mg | INTRAMUSCULAR | Status: DC | PRN

## 2020-04-20 MED ORDER — AMLODIPINE BESYLATE 5 MG PO TABS
5.0000 mg | ORAL_TABLET | Freq: Every day | ORAL | Status: DC
  Administered 2020-04-20 – 2020-04-21 (×2): 5 mg via ORAL
  Filled 2020-04-20 (×2): qty 1

## 2020-04-20 MED ORDER — FOLIC ACID 1 MG PO TABS
1.0000 mg | ORAL_TABLET | Freq: Every day | ORAL | Status: DC
  Administered 2020-04-20 – 2020-04-24 (×5): 1 mg via ORAL
  Filled 2020-04-20 (×5): qty 1

## 2020-04-20 MED ORDER — METOPROLOL TARTRATE 25 MG PO TABS
25.0000 mg | ORAL_TABLET | Freq: Two times a day (BID) | ORAL | Status: DC
  Administered 2020-04-20 – 2020-04-22 (×4): 25 mg via ORAL
  Filled 2020-04-20 (×3): qty 1

## 2020-04-20 MED ORDER — ACETAMINOPHEN 650 MG RE SUPP: 650.0000 mg | Freq: Four times a day (QID) | RECTAL | Status: DC | PRN

## 2020-04-20 MED ORDER — ENOXAPARIN SODIUM 100 MG/ML ~~LOC~~ SOLN
1.0000 mg/kg | Freq: Two times a day (BID) | SUBCUTANEOUS | Status: DC
  Administered 2020-04-20 – 2020-04-21 (×3): 85 mg via SUBCUTANEOUS
  Filled 2020-04-20 (×6): qty 1

## 2020-04-20 MED ORDER — CLONIDINE HCL 0.1 MG PO TABS
0.1000 mg | ORAL_TABLET | Freq: Three times a day (TID) | ORAL | Status: DC | PRN
  Administered 2020-04-21 – 2020-04-23 (×4): 0.1 mg via ORAL
  Filled 2020-04-20 (×4): qty 1

## 2020-04-20 MED ORDER — SODIUM CHLORIDE 0.9 % IV SOLN
2.0000 g | INTRAVENOUS | Status: DC
  Administered 2020-04-21 (×2): 2 g via INTRAVENOUS
  Filled 2020-04-20 (×2): qty 20

## 2020-04-20 MED ORDER — AZITHROMYCIN 500 MG PO TABS
500.0000 mg | ORAL_TABLET | Freq: Every day | ORAL | Status: AC
  Administered 2020-04-20: 500 mg via ORAL
  Filled 2020-04-20: qty 1

## 2020-04-20 MED ORDER — THIAMINE HCL 100 MG PO TABS
100.0000 mg | ORAL_TABLET | Freq: Every day | ORAL | Status: DC
  Administered 2020-04-21 – 2020-04-24 (×4): 100 mg via ORAL
  Filled 2020-04-20 (×5): qty 1

## 2020-04-20 MED ORDER — LORAZEPAM 1 MG PO TABS
1.0000 mg | ORAL_TABLET | ORAL | Status: DC | PRN
  Administered 2020-04-21: 1 mg via ORAL
  Filled 2020-04-20: qty 1

## 2020-04-20 MED ORDER — METHYLPREDNISOLONE SODIUM SUCC 40 MG IJ SOLR
40.0000 mg | Freq: Two times a day (BID) | INTRAMUSCULAR | Status: DC
  Administered 2020-04-20 – 2020-04-22 (×4): 40 mg via INTRAVENOUS
  Filled 2020-04-20 (×4): qty 1

## 2020-04-20 MED ORDER — TOLVAPTAN 15 MG PO TABS
15.0000 mg | ORAL_TABLET | ORAL | Status: AC
  Administered 2020-04-20: 15 mg via ORAL
  Filled 2020-04-20: qty 1

## 2020-04-20 MED ORDER — AZITHROMYCIN 250 MG PO TABS
250.0000 mg | ORAL_TABLET | Freq: Every day | ORAL | Status: AC
  Administered 2020-04-21 – 2020-04-24 (×4): 250 mg via ORAL
  Filled 2020-04-20 (×4): qty 1

## 2020-04-20 MED ORDER — IPRATROPIUM-ALBUTEROL 0.5-2.5 (3) MG/3ML IN SOLN
3.0000 mL | Freq: Four times a day (QID) | RESPIRATORY_TRACT | Status: DC
  Administered 2020-04-20 – 2020-04-24 (×15): 3 mL via RESPIRATORY_TRACT
  Filled 2020-04-20 (×14): qty 3

## 2020-04-20 MED ORDER — THIAMINE HCL 100 MG/ML IJ SOLN
100.0000 mg | Freq: Every day | INTRAMUSCULAR | Status: DC
  Administered 2020-04-20: 100 mg via INTRAVENOUS
  Filled 2020-04-20 (×2): qty 2

## 2020-04-20 MED ORDER — SODIUM CHLORIDE 0.9% FLUSH: 3.0000 mL | Freq: Once | INTRAVENOUS | Status: DC

## 2020-04-20 MED ORDER — ONDANSETRON HCL 4 MG/2ML IJ SOLN: 4.0000 mg | Freq: Four times a day (QID) | INTRAMUSCULAR | Status: DC | PRN

## 2020-04-20 MED ORDER — ACETAMINOPHEN 325 MG PO TABS: 650.0000 mg | ORAL_TABLET | Freq: Four times a day (QID) | ORAL | Status: DC | PRN

## 2020-04-20 MED ORDER — ADULT MULTIVITAMIN W/MINERALS CH
1.0000 | ORAL_TABLET | Freq: Every day | ORAL | Status: DC
  Administered 2020-04-20 – 2020-04-24 (×5): 1 via ORAL
  Filled 2020-04-20 (×6): qty 1

## 2020-04-20 MED ORDER — ONDANSETRON HCL 4 MG PO TABS: 4.0000 mg | ORAL_TABLET | Freq: Four times a day (QID) | ORAL | Status: DC | PRN

## 2020-04-20 MED ORDER — NICOTINE 7 MG/24HR TD PT24
7.0000 mg | MEDICATED_PATCH | Freq: Every day | TRANSDERMAL | Status: DC
  Administered 2020-04-20 – 2020-04-24 (×5): 7 mg via TRANSDERMAL
  Filled 2020-04-20 (×6): qty 1

## 2020-04-20 MED ORDER — ASPIRIN EC 81 MG PO TBEC
81.0000 mg | DELAYED_RELEASE_TABLET | Freq: Every day | ORAL | Status: DC
  Administered 2020-04-20 – 2020-04-24 (×5): 81 mg via ORAL
  Filled 2020-04-20 (×5): qty 1

## 2020-04-20 NOTE — ED Notes (Signed)
Eating dinner tray  Family with pt.  Pt waiting on admission bed.

## 2020-04-20 NOTE — Progress Notes (Signed)
ANTICOAGULATION CONSULT NOTE  Pharmacy Consult for Lovenox Indication: atrial fibrillation  No Known Allergies  Patient Measurements: Height: 5\' 11"  (180.3 cm) Weight: 86.2 kg (190 lb) IBW/kg (Calculated) : 75.3  Vital Signs: Temp: 97.6 F (36.4 C) (05/18 1452) Temp Source: Oral (05/18 1452) BP: 138/126 (05/18 1700) Pulse Rate: 73 (05/18 1730)  Labs: Recent Labs    04/20/20 1513 04/20/20 1708  HGB 15.4  15.1  --   HCT 41.9  42.8  --   PLT 206  193  --   CREATININE 0.55*  --   TROPONINIHS  --  13    Estimated Creatinine Clearance: 112.4 mL/min (A) (by C-G formula based on SCr of 0.55 mg/dL (L)).   Medical History: Past Medical History:  Diagnosis Date  . A-fib (HCC)   . Anxiety   . CHF (congestive heart failure) (HCC)   . Depression   . Hypersomnia with sleep apnea   . Hypertension   . Morbid obesity (HCC)   . NSTEMI (non-ST elevated myocardial infarction) (HCC) 2005  . Obesity hypoventilation syndrome (HCC)   . Shortness of breath dyspnea   . Sleep apnea   . Stroke (HCC)   . V-tach [facility])     Assessment: 54 year old male presented in afib. Patient does not appear to be on any medications PTA. Pharmacy consulted for Lovenox for afib.  Goal of Therapy:  Monitor platelets by anticoagulation protocol: Yes   Plan:  Lovenox 1 mg/kg (85 mg) q12h. CBC q72h while inpatient.  57, PharmD 04/20/2020,6:10 PM
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
## 2020-04-20 NOTE — ED Notes (Addendum)
Pt reports feeling weak.  Pt denies chest pain or sob at this time.  No n/v/d.  Pt on 3 liters oxygen at home for copd, afib   Pt drowsy  md at bedside.

## 2020-04-20 NOTE — ED Notes (Signed)
Pt receiving many phone calls,visitor in room and pt asked to have 1 person to relay any questions to one person instead of multiple calls.
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
## 2020-04-20 NOTE — ED Notes (Signed)
Family with pt   Pt more alert. t

## 2020-04-20 NOTE — ED Triage Notes (Signed)
Pt in via EMS with c/o AMS and weakness   Hx CPOD, FSBS 75, A-fib, hx of same. Pt reports has not had a good meal in week. Pt also with weakness for a few days.

## 2020-04-20 NOTE — ED Provider Notes (Signed)
[facility] Emergency Department Provider Note  ____________________________________________  Time seen: Approximately 4:36 PM  I have reviewed the triage vital signs and the nursing notes.   HISTORY  Chief Complaint Weakness    HPI [name] is a 54 y.o. male with a history of A. fib CHF hypertension prior stroke who comes the ED complaining of  generalized weakness and loss of appetite over the last week. Gradual onset, constant, no aggravating or alleviating factors. Uses 3 L nasal cannula at home. Denies chest pain fevers chills diarrhea. Does not take diuretics.  Notes that he recently ran out of 2 of his blood pressure medicines, no other changes to medication regimen.  He follows a low-salt diet, does not add any to his food, rinses all of his canned foods before eating them to remove salt     Past Medical History:  Diagnosis Date  . A-fib (HCC)   . Anxiety   . CHF (congestive heart failure) (HCC)   . Depression   . Hypersomnia with sleep apnea   . Hypertension   . Morbid obesity (HCC)   . NSTEMI (non-ST elevated myocardial infarction) (HCC) 2005  . Obesity hypoventilation syndrome (HCC)   . Shortness of breath dyspnea   . Sleep apnea   . Stroke (HCC)   . V-tach [facility])      Patient Active Problem List   Diagnosis Date Noted  . Hyponatremia 04/20/2020  . Ischemic stroke (HCC) 04/20/2019  . ARF (acute renal failure) (HCC) 03/26/2018  . CHF (congestive heart failure) (HCC) 07/17/2017  . 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 Panic disorder 01/18/2017  . Chest pain 01/17/2017  . Sleep apnea, obstructive 02/01/2016  . Tracheostomy care (HCC)   . 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 Debility 12/16/2014  . Obesity hypoventilation syndrome (HCC) 12/16/2014  . Encephalopathy pulmonary 12/15/2014     Past Surgical History:  Procedure Laterality Date  .  LEFT HEART CATH AND CORONARY ANGIOGRAPHY Right 09/10/2018   Procedure: LEFT HEART CATH AND CORONARY ANGIOGRAPHY with possible percutaneous intervention;  Surgeon: Laurier Nancy, MD;  Location: [facility] INVASIVE CV LAB;  Service: Cardiovascular;  Laterality: Right;  . TRACHEOSTOMY       Prior to Admission medications   Not on File     Allergies Patient has no known allergies.   Family History  Problem Relation Age of Onset  . Heart disease Father   . Diabetes type II Mother   . Breast cancer Mother     Social History Social History   Tobacco Use  . Smoking status: Current Every Day Smoker    Packs/day: 0.25    Years: 28.00    Pack years: 7.00    Types: Cigarettes  . Smokeless tobacco: Never Used  Substance Use Topics  . Alcohol use: Yes    Alcohol/week: 4.0 standard drinks    Types: 4 Cans of beer per week  . Drug use: No    Review of Systems  Constitutional:   No fever or chills.  ENT:   No sore throat. No rhinorrhea. Cardiovascular:   No chest pain or syncope. Respiratory:   Positive shortness of breath without cough. Gastrointestinal:   Negative for abdominal pain, vomiting and diarrhea.  Musculoskeletal:   Negative for focal pain or swelling All other systems reviewed and are negative except as documented above in ROS and HPI.  ____________________________________________   PHYSICAL EXAM:  VITAL SIGNS:  ED Triage Vitals  Enc Vitals Group     BP 04/20/20 1452 (!) 149/107     Pulse Rate 04/20/20 1452 67     Resp 04/20/20 1452 20     Temp 04/20/20 1452 97.6 F (36.4 C)     Temp Source 04/20/20 1452 Oral     SpO2 04/20/20 1452 96 %     Weight 04/20/20 1458 190 lb (86.2 kg)     Height 04/20/20 1458 5\' 11"  (1.803 m)     Head Circumference --      Peak Flow --      Pain Score 04/20/20 1501 0     Pain Loc --      Pain Edu? --      Excl. in GC? --     Vital signs reviewed, nursing assessments reviewed.   Constitutional:   Alert and oriented to person  and place. Non-toxic appearance. Eyes:   Conjunctivae are normal. EOMI. PERRL. ENT      Head:   Normocephalic and atraumatic.      Nose:   Wearing a mask.      Mouth/Throat:   Wearing a mask.      Neck:   No meningismus. Full ROM. Hematological/Lymphatic/Immunilogical:   No cervical lymphadenopathy. Cardiovascular:   RRR. Symmetric bilateral radial and DP pulses.  No murmurs. Cap refill less than 2 seconds. Respiratory:   Normal respiratory effort without tachypnea/retractions. Diminished breath sounds right base. Bibasilar crackles Gastrointestinal:   Soft and nontender. Moderately distended with mild abdominal wall edema.  There is no CVA tenderness.  No rebound, rigidity, or guarding.  Musculoskeletal:   Normal range of motion in all extremities. No joint effusions.  No lower extremity tenderness. 2+ pitting edema bilateral lower extremities, symmetric calf circumference. Neurologic:   Slightly slurred speech, normal language.  Motor grossly intact. No acute focal neurologic deficits are appreciated.  Skin:    Skin is warm, dry and intact. No rash noted.  No petechiae, purpura, or bullae.  ____________________________________________    LABS (pertinent positives/negatives) (all labs ordered are listed, but only abnormal results are displayed) Labs Reviewed  CBC - Abnormal; Notable for the following components:      Result Value   WBC 2.7 (*)    All other components within normal limits  COMPREHENSIVE METABOLIC PANEL - Abnormal; Notable for the following components:   Sodium 113 (*)    Chloride 76 (*)    Creatinine, Ser 0.55 (*)    Calcium 8.5 (*)    Total Bilirubin 1.5 (*)    All other components within normal limits  BLOOD GAS, VENOUS - Abnormal; Notable for the following components:   Bicarbonate 29.0 (*)    All other components within normal limits  CBC WITH DIFFERENTIAL/PLATELET - Abnormal; Notable for the following components:   WBC 2.8 (*)    MCHC 36.8 (*)    Neutro  Abs 1.4 (*)    Lymphs Abs 0.5 (*)    All other components within normal limits  SARS CORONAVIRUS 2 BY RT PCR (HOSPITAL ORDER, PERFORMED IN [facility])  URINALYSIS, COMPLETE (UACMP) WITH MICROSCOPIC  DIFFERENTIAL  BASIC METABOLIC PANEL  SODIUM  BASIC METABOLIC PANEL  CBC  HIV ANTIBODY (ROUTINE TESTING W REFLEX)  TSH  LIPID PANEL  CBG MONITORING, ED  TROPONIN I (HIGH SENSITIVITY)  TROPONIN I (HIGH SENSITIVITY)   ____________________________________________   EKG  Interpreted by me Atrial fibrillation, rate of 75, right axis, normal intervals. Poor R wave  progression. Normal ST segments and T waves.  ____________________________________________    RADIOLOGY  DG Chest 2 View  Result Date: 04/20/2020 CLINICAL DATA:  Shortness of breath for the past week. Smoker. EXAM: CHEST - 2 VIEW COMPARISON:  03/11/2019 FINDINGS: Stable enlarged cardiac silhouette. Mild increase in prominence of the pulmonary vasculature and interstitial markings with scattered poorly defined patchy opacities in both lungs, greater on the left. Interval small to moderate-sized right pleural effusion. Unremarkable bones. IMPRESSION: 1. Interval changes of congestive heart failure with mild patchy bilateral pneumonia or alveolar edema. 2. Interval small to moderate-sized right pleural effusion. 3. Stable cardiomegaly. Electronically Signed   By: Claudie Revering M.D.   On: 04/20/2020 16:25   CT HEAD WO CONTRAST  Result Date: 04/20/2020 CLINICAL DATA:  Altered mental status EXAM: CT HEAD WITHOUT CONTRAST TECHNIQUE: Contiguous axial images were obtained from the base of the skull through the vertex without intravenous contrast. 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 There is atrophy and chronic small vessel disease changes. No acute intracranial abnormality. Specifically, no hemorrhage, hydrocephalus, mass lesion, acute infarction, or significant  intracranial injury. Vascular: No hyperdense vessel or unexpected calcification. Skull: No acute calvarial abnormality. Sinuses/Orbits: Mucosal thickening in the right maxillary sinus. No air-fluid levels. Other: None IMPRESSION: No acute intracranial abnormality. Electronically Signed   By: Rolm Baptise M.D.   On: 04/20/2020 17:30    ____________________________________________   PROCEDURES .Critical Care Performed by: Carrie Mew, MD Authorized by: Carrie Mew, MD   Critical care provider statement:    Critical care time (minutes):  35   Critical care time was exclusive of:  Separately billable procedures and treating other patients   Critical care was necessary to treat or prevent imminent or life-threatening deterioration of the following conditions:  CNS failure or compromise, metabolic crisis and cardiac failure   Critical care was time spent personally by me on the following activities:  Development of treatment plan with patient or surrogate, discussions with consultants, evaluation of patient's response to treatment, examination of patient, obtaining history from patient or surrogate, ordering and performing treatments and interventions, ordering and review of laboratory studies, ordering and review of radiographic studies, pulse oximetry, re-evaluation of patient's condition and review of old charts    ____________________________________________  DIFFERENTIAL DIAGNOSIS   Electrolyte abnormality, dehydration, AKI, CHF/volume overload, pneumonia, pleural effusion, ptx, Covid  CLINICAL IMPRESSION / ASSESSMENT AND PLAN / ED COURSE  Medications ordered in the ED: Medications  sodium chloride flush (NS) 0.9 % injection 3 mL (has no administration in time range)  tolvaptan (SAMSCA) tablet 15 mg (has no administration in time range)  metoprolol tartrate (LOPRESSOR) tablet 25 mg (has no administration in time range)  amLODipine (NORVASC) tablet 5 mg (has no  administration in time range)  aspirin EC tablet 81 mg (has no administration in time range)  acetaminophen (TYLENOL) tablet 650 mg (has no administration in time range)    Or  acetaminophen (TYLENOL) suppository 650 mg (has no administration in time range)  ondansetron (ZOFRAN) tablet 4 mg (has no administration in time range)    Or  ondansetron (ZOFRAN) injection 4 mg (has no administration in time range)  LORazepam (ATIVAN) tablet 1-4 mg (has no administration in time range)    Or  LORazepam (ATIVAN) injection 1-4 mg (has no administration in time range)  thiamine tablet 100 mg (has no administration in time range)    Or  thiamine (B-1) injection 100 mg (has no administration in  time range)  folic acid (FOLVITE) tablet 1 mg (has no administration in time range)  multivitamin with minerals tablet 1 tablet (has no administration in time range)  Ipratropium-Albuterol (COMBIVENT) respimat 1 puff (has no administration in time range)  cefTRIAXone (ROCEPHIN) 2 g in sodium chloride 0.9 % 100 mL IVPB (has no administration in time range)  azithromycin (ZITHROMAX) tablet 500 mg (has no administration in time range)    Followed by  azithromycin (ZITHROMAX) tablet 250 mg (has no administration in time range)  methylPREDNISolone sodium succinate (SOLU-MEDROL) 40 mg/mL injection 40 mg (has no administration in time range)  nicotine (NICODERM CQ - dosed in mg/24 hr) patch 7 mg (has no administration in time range)  enoxaparin (LOVENOX) injection 85 mg (has no administration in time range)  cloNIDine (CATAPRES) tablet 0.1 mg (has no administration in time range)    Pertinent labs & imaging results that were available during my care of the patient were reviewed by me and considered in my medical decision making (see chart for details).  Andrew Sparks was evaluated in Emergency Department on 04/20/2020 for the symptoms described in the history of present illness. He was evaluated in the  context of the global COVID-19 pandemic, which necessitated consideration that the patient might be at risk for infection with the SARS-CoV-2 virus that causes COVID-19. Institutional protocols and algorithms that pertain to the evaluation of patients at risk for COVID-19 are in a state of rapid change based on information released by regulatory bodies including the CDC and federal and state organizations. These policies and algorithms were followed during the patient's care in the ED.   Patient presents with generalized weakness, gradually worsening over the past week with some increase in his chronic shortness of breath but stable on his usual 3 L nasal cannula. Abnormal exam of right lung base. Will check labs and chest x-ray.  Clinical Course as of Apr 20 1820  Tue Apr 20, 2020  1612 Cxr interpreted by me, shows right pleural effusion. Labs show Na 113, which is acute hyponatremia from baseline of 130, leukopenia with wbc 2.7. will add differential, covid test. Vbg normal.    [PS]    Clinical Course User Index [PS] Sharman Cheek, MD     ----------------------------------------- 6:21 PM on 04/20/2020 -----------------------------------------  CT of the head unremarkable, no signs of edema.  Further care per hospitalist.  ____________________________________________   FINAL CLINICAL IMPRESSION(S) / ED DIAGNOSES    Final diagnoses:  Generalized weakness  Acute hyponatremia  Acute on chronic congestive heart failure, unspecified heart failure type (HCC)  Chronic respiratory failure with hypoxia   ED Discharge Orders    None      Portions of this note were generated with dragon dictation software. Dictation errors may occur despite best attempts at proofreading.   Sharman Cheek, MD 04/20/20 [phone]

## 2020-04-20 NOTE — H&P (Addendum)
[facility] NAME: Andrew Sparks    MR#:  [id number]  DATE OF BIRTH:  [birth year]  DATE OF ADMISSION:  04/20/2020  PRIMARY CARE PHYSICIAN: Jodi Marble, MD   REQUESTING/REFERRING PHYSICIAN: Dr Bonnielee Haff  CHIEF COMPLAINT:   Chief Complaint  Patient presents with  . Weakness    HISTORY OF PRESENT ILLNESS:  Andrew Sparks  is a 54 y.o. male states family called EMS to pick him up.  He states for the past week he has been swollen and feeling very weak.  He always has some shortness of breath.  He chronically wears 3 L of oxygen.  He does have some wheezing.  He has nausea vomiting and poor appetite.  He has had some chills but no fever.  Feeling very fatigued.  In the ER, he was found to have a sodium of 113 and chest x-ray looks like fluid overload.  Hospitalist services were contacted for further evaluation.  He also had a CT scan of the head which was negative for acute infarct.  PAST MEDICAL HISTORY:   Past Medical History:  Diagnosis Date  . A-fib ([place])   . Anxiety   . CHF (congestive heart failure) ([place])   . Depression   . Hypersomnia with sleep apnea   . Hypertension   . Morbid obesity ([place])   . NSTEMI (non-ST elevated myocardial infarction) ([place]) 2005  . Obesity hypoventilation syndrome ([place])   . Shortness of breath dyspnea   . Sleep apnea   . Stroke ([place])   . V-tach [facility])     PAST SURGICAL HISTORY:   Past Surgical History:  Procedure Laterality Date  . LEFT HEART CATH AND CORONARY ANGIOGRAPHY Right 09/10/2018   Procedure: LEFT HEART CATH AND CORONARY ANGIOGRAPHY with possible percutaneous intervention;  Surgeon: Dionisio David, MD;  Location: [place] CV LAB;  Service: Cardiovascular;  Laterality: Right;  . TRACHEOSTOMY      SOCIAL HISTORY:   Social History   Tobacco Use  . Smoking status: Current Every Day Smoker    Packs/day: 0.25    Years: 28.00    Pack years: 7.00    Types: Cigarettes   . Smokeless tobacco: Never Used  Substance Use Topics  . Alcohol use: Yes    Alcohol/week: 4.0 standard drinks    Types: 4 Cans of beer per week    FAMILY HISTORY:   Family History  Problem Relation Age of Onset  . Heart disease Father   . Diabetes type II Mother   . Breast cancer Mother     DRUG ALLERGIES:  No Known Allergies  REVIEW OF SYSTEMS:  CONSTITUTIONAL: No fever, fatigue or weakness.  EYES: No blurred or double vision.  EARS, NOSE, AND THROAT: No tinnitus or ear pain. No sore throat RESPIRATORY: No cough, shortness of breath, wheezing or hemoptysis.  CARDIOVASCULAR: No chest pain, orthopnea, edema.  GASTROINTESTINAL: No nausea, vomiting, diarrhea or abdominal pain. No blood in bowel movements GENITOURINARY: No dysuria, hematuria.  ENDOCRINE: No polyuria, nocturia,  HEMATOLOGY: No anemia, easy bruising or bleeding SKIN: No rash or lesion. MUSCULOSKELETAL: No joint pain or arthritis.   NEUROLOGIC: No tingling, numbness, weakness.  PSYCHIATRY: No anxiety or depression.   MEDICATIONS AT HOME:   Prior to Admission medications   Not on File   Medication reconciliation still undergoing.  He states he takes 3 blood pressure medications and one may be hydrochlorothiazide.  He also takes Prozac which he has been  on for a long time.  VITAL SIGNS:  Blood pressure (!) 138/126, pulse 73, temperature 97.6 F (36.4 C), temperature source Oral, resp. rate 18, height 5\' 11"  (1.803 m), weight 86.2 kg, SpO2 97 %.  PHYSICAL EXAMINATION:  GENERAL:  55 y.o.-year-old patient lying in the bed with no acute distress.  EYES: Pupils equal, round, reactive to light and accommodation. No scleral icterus. Extraocular muscles intact.  HEENT: Head atraumatic, normocephalic. Oropharynx and nasopharynx clear.  NECK:  Supple, no jugular venous distention. No thyroid enlargement, no tenderness.  LUNGS: Decreased breath sounds bilaterally. No use of accessory muscles of respiration.  Positive  rales halfway up the lung field. CARDIOVASCULAR: S1, S2 irregularly irregular. No murmurs, rubs, or gallops.  ABDOMEN: Soft, nontender, distended.  Ventral hernia.  Bowel sounds present. No organomegaly or mass.  EXTREMITIES: 3+ pedal edema.  No cyanosis, or clubbing.  NEUROLOGIC: Cranial nerves II through XII are intact. Muscle strength 5/5 in all extremities. Sensation intact. Gait not checked.  PSYCHIATRIC: The patient is alert and oriented to person but.  SKIN: No rash, lesion, or ulcer.   LABORATORY PANEL:   CBC Recent Labs  Lab 04/20/20 1513  WBC 2.8*  2.7*  HGB 15.4  15.1  HCT 41.9  42.8  PLT 206  193   ------------------------------------------------------------------------------------------------------------------  Chemistries  Recent Labs  Lab 04/20/20 1513  NA 113*  K 4.4  CL 76*  CO2 27  GLUCOSE 87  BUN 6  CREATININE 0.55*  CALCIUM 8.5*  AST 26  ALT 19  ALKPHOS 93  BILITOT 1.5*   ------------------------------------------------------------------------------------------------------------------  Cardiac Enzymes High-sensitivity troponin 12  RADIOLOGY:  DG Chest 2 View  Result Date: 04/20/2020 CLINICAL DATA:  Shortness of breath for the past week. Smoker. EXAM: CHEST - 2 VIEW COMPARISON:  03/11/2019 FINDINGS: Stable enlarged cardiac silhouette. Mild increase in prominence of the pulmonary vasculature and interstitial markings with scattered poorly defined patchy opacities in both lungs, greater on the left. Interval small to moderate-sized right pleural effusion. Unremarkable bones. IMPRESSION: 1. Interval changes of congestive heart failure with mild patchy bilateral pneumonia or alveolar edema. 2. Interval small to moderate-sized right pleural effusion. 3. Stable cardiomegaly. Electronically Signed   By: 05/11/2019 M.D.   On: 04/20/2020 16:25   CT HEAD WO CONTRAST  Result Date: 04/20/2020 CLINICAL DATA:  Altered mental status EXAM: CT HEAD WITHOUT  CONTRAST TECHNIQUE: Contiguous axial images were obtained from the base of the skull through the vertex without intravenous contrast. 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 There is atrophy and chronic small vessel disease changes. No acute intracranial abnormality. Specifically, no hemorrhage, hydrocephalus, mass lesion, acute infarction, or significant intracranial injury. Vascular: No hyperdense vessel or unexpected calcification. Skull: No acute calvarial abnormality. Sinuses/Orbits: Mucosal thickening in the right maxillary sinus. No air-fluid levels. Other: None IMPRESSION: No acute intracranial abnormality. Electronically Signed   By: 04/25/2019 M.D.   On: 04/20/2020 17:30    EKG:   Atrial fibrillation 75 bpm, right bundle branch block  IMPRESSION AND PLAN:   1.  Severe hyponatremia.  Sodium of 113 with evidence of fluid overload.  Case discussed with nephrology Dr. 04/22/2020 and will give a dose of tolvaptan.  Serial sodiums.  Watch closely in the stepdown unit.  If the patient was on hydrochlorothiazide at home this is not a good medication for him. 2.  Accelerated hypertension.  Start with metoprolol and Norvasc and monitor blood pressure.  Tolvaptan should also lower  the blood pressure.  As needed oral clonidine for high blood pressure.  Likely can add ACE inhibitor if blood pressure still high later on. 3.  Acute on chronic diastolic congestive heart failure.  Get another echocardiogram.  Tolvaptan today to help out with fluid.  Depending on sodium levels tomorrow may end up needing another dose of tolvaptan or starting Lasix.  Cardiologist needed can consult Dr. Cain Saupe cardiology because he works with Dr. Ellsworth Lennox. 4.  Chronic atrial fibrillation.  We will give Lovenox injections for now.  Metoprolol for rate control. 5.  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  Possibility of pneumonia.  Start Solu-Medrol,  Combivent, Rocephin and Zithromax.  Chronic 3 L of oxygen. 6.  Sleep apnea does not wear CPAP 7.  Depression anxiety on Prozac.  Need to clarify dose. 8.  Alcohol abuse drinks 4 drinks per day we will put on alcohol withdrawal protocol. 9.  Tobacco abuse.  Smoking cessation counseling done 4 minutes by me.  Nicotine patch ordered.  Of note reviewing cardiac catheterization from 2019 showed normal coronary arteries.  All the records, laboratory and radiological data are reviewed and case discussed with ED provider. Management plans discussed with the patient, and he is in agreement.  He refused me calling any family members at this time to give an update.  Patient meets inpatient criteria with severe hyponatremia of 113.  We will have to lift his sodium level up slowly in order to avoid complications.  He will be monitored closely in the stepdown unit  CODE STATUS: Full code  TOTAL TIME TAKING CARE OF THIS PATIENT: 55 minutes.    Alford Highland M.D on 04/20/2020 at 6:05 PM  Between 7am to 6pm - Pager - [phone]  After 6pm call admission pager [phone]  [facility]  CC: Primary care physician; Sherron Monday, MD

## 2020-04-21 LAB — DIFFERENTIAL
Abs Immature Granulocytes: 0.03 10*3/uL (ref 0.00–0.07)
Basophils Absolute: 0 10*3/uL (ref 0.0–0.1)
Basophils Relative: 0 %
Eosinophils Absolute: 0 10*3/uL (ref 0.0–0.5)
Eosinophils Relative: 0 %
Immature Granulocytes: 1 %
Lymphocytes Relative: 11 %
Lymphs Abs: 0.3 10*3/uL — ABNORMAL LOW (ref 0.7–4.0)
Monocytes Absolute: 0.2 10*3/uL (ref 0.1–1.0)
Monocytes Relative: 9 %
Neutro Abs: 2.3 10*3/uL (ref 1.7–7.7)
Neutrophils Relative %: 79 %

## 2020-04-21 LAB — BASIC METABOLIC PANEL
Anion gap: 13 (ref 5–15)
Anion gap: 12 (ref 5–15)
BUN: 7 mg/dL (ref 6–20)
BUN: 7 mg/dL (ref 6–20)
CO2: 28 mmol/L (ref 22–32)
CO2: 32 mmol/L (ref 22–32)
Calcium: 9.1 mg/dL (ref 8.9–10.3)
Calcium: 9 mg/dL (ref 8.9–10.3)
Chloride: 81 mmol/L — ABNORMAL LOW (ref 98–111)
Chloride: 81 mmol/L — ABNORMAL LOW (ref 98–111)
Creatinine, Ser: 0.57 mg/dL — ABNORMAL LOW (ref 0.61–1.24)
Creatinine, Ser: 0.54 mg/dL — ABNORMAL LOW (ref 0.61–1.24)
GFR calc Af Amer: >60 mL/min (ref >60)
GFR calc Af Amer: >60 mL/min (ref >60)
GFR calc non Af Amer: >60 mL/min (ref >60)
GFR calc non Af Amer: >60 mL/min (ref >60)
Glucose, Bld: 86 mg/dL (ref 70–99)
Glucose, Bld: 116 mg/dL — ABNORMAL HIGH (ref 70–99)
Potassium: 4.2 mmol/L (ref 3.5–5.1)
Potassium: 4.2 mmol/L (ref 3.5–5.1)
Sodium: 122 mmol/L — ABNORMAL LOW (ref 135–145)
Sodium: 125 mmol/L — ABNORMAL LOW (ref 135–145)

## 2020-04-21 LAB — CBC
HCT: 47.1 % (ref 39.0–52.0)
Hemoglobin: 17.1 g/dL — ABNORMAL HIGH (ref 13.0–17.0)
MCH: 33.1 pg (ref 26.0–34.0)
MCHC: 36.3 g/dL — ABNORMAL HIGH (ref 30.0–36.0)
MCV: 91.3 fL (ref 80.0–100.0)
Platelets: 215 10*3/uL (ref 150–400)
RBC: 5.16 MIL/uL (ref 4.22–5.81)
RDW: 13 % (ref 11.5–15.5)
WBC: 2.8 10*3/uL — ABNORMAL LOW (ref 4.0–10.5)
nRBC: 0 % (ref 0.0–0.2)

## 2020-04-21 LAB — LIPID PANEL
Cholesterol: 191 mg/dL (ref 0–200)
HDL: 82 mg/dL (ref >40)
LDL Cholesterol: 100 mg/dL — ABNORMAL HIGH (ref 0–99)
Total CHOL/HDL Ratio: 2.3 RATIO
Triglycerides: 47 mg/dL (ref <150)
VLDL: 9 mg/dL (ref 0–40)

## 2020-04-21 LAB — TROPONIN I (HIGH SENSITIVITY): Troponin I (High Sensitivity): 12 ng/L (ref <18)

## 2020-04-21 LAB — URINE DRUG SCREEN, QUALITATIVE (ARMC ONLY)
Amphetamines, Ur Screen: NOT DETECTED
Barbiturates, Ur Screen: NOT DETECTED
Benzodiazepine, Ur Scrn: NOT DETECTED
Cannabinoid 50 Ng, Ur ~~LOC~~: NOT DETECTED
Cocaine Metabolite,Ur ~~LOC~~: NOT DETECTED
MDMA (Ecstasy)Ur Screen: NOT DETECTED
Methadone Scn, Ur: NOT DETECTED
Opiate, Ur Screen: NOT DETECTED
Phencyclidine (PCP) Ur S: NOT DETECTED
Tricyclic, Ur Screen: NOT DETECTED

## 2020-04-21 LAB — TSH: TSH: 0.699 u[IU]/mL (ref 0.350–4.500)

## 2020-04-21 LAB — HIV ANTIBODY (ROUTINE TESTING W REFLEX): HIV Screen 4th Generation wRfx: NONREACTIVE

## 2020-04-21 MED ORDER — AMLODIPINE BESYLATE 10 MG PO TABS
10.0000 mg | ORAL_TABLET | Freq: Every day | ORAL | Status: DC
  Administered 2020-04-22 – 2020-04-24 (×3): 10 mg via ORAL
  Filled 2020-04-21 (×2): qty 1
  Filled 2020-04-21: qty 2

## 2020-04-21 MED ORDER — FUROSEMIDE 10 MG/ML IJ SOLN
40.0000 mg | Freq: Two times a day (BID) | INTRAMUSCULAR | Status: DC
  Administered 2020-04-21 – 2020-04-24 (×5): 40 mg via INTRAVENOUS
  Filled 2020-04-21 (×4): qty 4

## 2020-04-21 MED ORDER — FUROSEMIDE 10 MG/ML IJ SOLN
INTRAMUSCULAR | Status: AC
  Filled 2020-04-21: qty 4

## 2020-04-21 NOTE — ED Notes (Signed)
Message sent to pharmacy regarding Nicoderm patch, pt currently sitting in recliner eating meal tray at this time.

## 2020-04-21 NOTE — ED Notes (Signed)
Pt noted to be A&O x4, pt sitting in recliner watching TV. Pt changed into new gown at this time. Pt tolerated well. Pt denies any needs at this time.

## 2020-04-21 NOTE — ED Notes (Signed)
Lab reports blood sent has hemolyzed, will send phlebotomist to draw again

## 2020-04-21 NOTE — ED Notes (Signed)
Dr. Luther Redo at bedside at this time.

## 2020-04-21 NOTE — ED Notes (Signed)
Lab at bedside to redraw patient's labs as requested by previous shift RN.

## 2020-04-21 NOTE — ED Notes (Signed)
Lab specimen sent twice, hemolyzed per lab.  Lab sending someone from lab to draw blood.

## 2020-04-21 NOTE — ED Notes (Signed)
Report off to lisa rn  

## 2020-04-21 NOTE — Progress Notes (Signed)
PROGRESS NOTE    Andrew Sparks  TGP:[id number] DOB: 07-21-1966 DOA: 04/20/2020 PCP: Jodi Marble, MD    Brief Narrative: Andrew Sparks  is a 54 y.o. male states family called EMS to pick him up.  He states for the past week he has been swollen and feeling very weak.  He always has some shortness of breath.  He chronically wears 3 L of oxygen.  He does have some wheezing.  He has nausea vomiting and poor appetite.  He has had some chills but no fever.  Feeling very fatigued.  In the ER, he was found to have a sodium of 113 and chest x-ray looks like fluid overload.  Hospitalist services were contacted for further evaluation.  He also had a CT scan of the head which was negative for acute infarct.  Patient also complaining of increased short of breath for the last week, he also had a significant leg edema.  Upon arrival in the hospital, nephrology consult was obtained for hyponatremia, he was given a dose of tolvaptan and discontinue hydrochlorothiazide.   Assessment & Plan:   Active Problems:   Obstructive sleep apnea syndrome   Hyponatremia  #1.  Hyponatremia with hypervolemia.  Sodium level is getting better.  Continue to monitor.  Appreciate nephrology consult, start diuretics.  #2.  Acute on chronic diastolic congestive heart failure. Patient has anasarca, pending echocardiogram.  Restart IV Lasix at 40 mg twice a day.  #3.  Hypertension emergency. Continue metoprolol, increase Norvasc to 10 mg daily.  #4.  Chronic atrial fibrillation. Rate controlled.  Currently treated with therapeutic Lovenox.  We will transition to oral Eliquis at time of discharge.  CHA2DS2-VASc score is 4.  #5.  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 Continue steroids, antibiotics.  6.  Obstructive sleep apnea. Continue CPAP while asleep.  7.  Alcohol abuse. No evidence of withdrawal.  Continue thiamine folic acid.  8.  Leukopenia. No neutropenia.   Follow.      DVT prophylaxis: lovenox Code Status: Full Family Communication: None Disposition Plan:  . Patient came from: Home            . Anticipated d/c place: Home . Barriers to d/c OR conditions which need to be met to effect a safe d/c:   Consultants:   Nephrology  Procedures: None Antimicrobials:  Rocephin and Zithromax.  Subjective: Patient is sleepy today.  He still has significant short of breath with min exertion.  He does not have any nausea vomiting or diarrhea today.  No fever or chills.  No dysuria hematuria.  Objective: Vitals:   04/21/20 1203 04/21/20 1300 04/21/20 1400 04/21/20 1500  BP: (!) 178/128 (!) 155/116 (!) 159/115 (!) 160/122  Pulse: 69 75 77 79  Resp: _0 Temp:      TempSrc:      SpO2: 96% 99% 95% 97%  Weight:      Height:        Intake/Output Summary (Last 24 hours) at 04/21/2020 1554 Last data filed at 04/21/2020 1205 Gross per 24 hour  Intake 340 ml  Output 3500 ml  Net -3160 ml   Filed Weights   04/20/20 1458  Weight: 86.2 kg    Examination:  General exam: Appears calm and comfortable  Respiratory system: Decreased breathing sounds.  Respiratory effort normal. Cardiovascular system: Irregularly irregular. No JVD, murmurs, rubs, gallops or clicks.  Anasarca with 3+ pedal edema.. Gastrointestinal system: Abdomen is nondistended, soft and nontender. No  organomegaly or masses felt. Normal bowel sounds heard. Central nervous system: Alert and oriented x2. No focal neurological deficits. Extremities: Symmetric  Skin: No rashes, lesions or ulcers Psychiatry: Judgement and insight appear normal. Mood & affect appropriate.     Data Reviewed: I have personally reviewed following labs and imaging studies  CBC: Recent Labs  Lab 04/20/20 1513 04/21/20 0437  WBC 2.8*  2.7* 2.8*  NEUTROABS 1.4* 2.3  HGB 15.4  15.1 17.1*  HCT 41.9  42.8 47.1  MCV 90.3  92.4 91.3  PLT 206  193 250   Basic Metabolic  Panel: Recent Labs  Lab 04/20/20 1513 04/21/20 0339 04/21/20 1200  NA 113* 122* 125*  K 4.4 4.2 4.2  CL 76* 81* 81*  CO2 27 28 32  GLUCOSE 87 86 116*  BUN _0 CREATININE 0.55* 0.57* 0.54*  CALCIUM 8.5* 9.1 9.0   GFR: Estimated Creatinine Clearance: 112.4 mL/min (A) (by C-G formula based on SCr of 0.54 mg/dL (L)). Liver Function Tests: Recent Labs  Lab 04/20/20 1513  AST 26  ALT 19  ALKPHOS 93  BILITOT 1.5*  PROT 7.7  ALBUMIN 3.9   No results for input(s): LIPASE, AMYLASE in the last 168 hours. No results for input(s): AMMONIA in the last 168 hours. Coagulation Profile: No results for input(s): INR, PROTIME in the last 168 hours. Cardiac Enzymes: No results for input(s): CKTOTAL, CKMB, CKMBINDEX, TROPONINI in the last 168 hours. BNP (last 3 results) No results for input(s): PROBNP in the last 8760 hours. HbA1C: No results for input(s): HGBA1C in the last 72 hours. CBG: Recent Labs  Lab 04/20/20 1511  GLUCAP 85   Lipid Profile: Recent Labs    04/21/20 0300  CHOL 191  HDL 82  LDLCALC 100*  TRIG 47  CHOLHDL 2.3   Thyroid Function Tests: Recent Labs    04/21/20 0300  TSH 0.699   Anemia Panel: No results for input(s): VITAMINB12, FOLATE, FERRITIN, TIBC, IRON, RETICCTPCT in the last 72 hours. Sepsis Labs: No results for input(s): PROCALCITON, LATICACIDVEN in the last 168 hours.  Recent Results (from the past 240 hour(s))  SARS Coronavirus 2 by RT PCR (hospital order, performed in [facility] hospital lab) Nasopharyngeal Nasopharyngeal Swab     Status: None   Collection Time: 04/20/20  3:53 PM   Specimen: Nasopharyngeal Swab  Result Value Ref Range Status   SARS Coronavirus 2 NEGATIVE NEGATIVE Final    Comment: (NOTE) SARS-CoV-2 target nucleic acids are NOT DETECTED. The SARS-CoV-2 RNA is generally detectable in upper and lower respiratory specimens during the acute phase of infection. The lowest concentration of SARS-CoV-2 viral copies this assay  can detect is 250 copies / mL. A negative result does not preclude SARS-CoV-2 infection and should not be used as the sole basis for treatment or other patient management decisions.  A negative result may occur with improper specimen collection / handling, submission of specimen other than nasopharyngeal swab, presence of viral mutation(s) within the areas targeted by this assay, and inadequate number of viral copies (<250 copies / mL). A negative result must be combined with clinical observations, patient history, and epidemiological information. Fact Sheet for Patients:   StrictlyIdeas.no Fact Sheet for Healthcare Providers: BankingDealers.co.za This test is not yet approved or cleared  by the [place] FDA and has been authorized for detection and/or diagnosis of SARS-CoV-2 by FDA under an Emergency Use Authorization (EUA).  This EUA will remain in effect (meaning this test can be used)  for the duration of the COVID-19 declaration under Section 564(b)(1) of the Act, 21 U.S.C. section 360bbb-3(b)(1), unless the authorization is terminated or revoked sooner. Performed at [facility], [address], [place], [place] [address]          Radiology Studies: DG Chest 2 View  Result Date: 04/20/2020 CLINICAL DATA:  Shortness of breath for the past week. Smoker. EXAM: CHEST - 2 VIEW COMPARISON:  03/11/2019 FINDINGS: Stable enlarged cardiac silhouette. Mild increase in prominence of the pulmonary vasculature and interstitial markings with scattered poorly defined patchy opacities in both lungs, greater on the left. Interval small to moderate-sized right pleural effusion. Unremarkable bones. IMPRESSION: 1. Interval changes of congestive heart failure with mild patchy bilateral pneumonia or alveolar edema. 2. Interval small to moderate-sized right pleural effusion. 3. Stable cardiomegaly. Electronically Signed   By: Claudie Revering M.D.    On: 04/20/2020 16:25   CT HEAD WO CONTRAST  Result Date: 04/20/2020 CLINICAL DATA:  Altered mental status EXAM: CT HEAD WITHOUT CONTRAST TECHNIQUE: Contiguous axial images were obtained from the base of the skull through the vertex without intravenous contrast. 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 There is atrophy and chronic small vessel disease changes. No acute intracranial abnormality. Specifically, no hemorrhage, hydrocephalus, mass lesion, acute infarction, or significant intracranial injury. Vascular: No hyperdense vessel or unexpected calcification. Skull: No acute calvarial abnormality. Sinuses/Orbits: Mucosal thickening in the right maxillary sinus. No air-fluid levels. Other: None IMPRESSION: No acute intracranial abnormality. Electronically Signed   By: Rolm Baptise M.D.   On: 04/20/2020 17:30        Scheduled Meds: . amLODipine  5 mg Oral Daily  . aspirin EC  81 mg Oral Daily  . azithromycin  250 mg Oral Daily  . enoxaparin (LOVENOX) injection  1 mg/kg Subcutaneous Q12H  . folic acid  1 mg Oral Daily  . furosemide  40 mg Intravenous Q12H  . ipratropium-albuterol  3 mL Inhalation Q6H  . methylPREDNISolone (SOLU-MEDROL) injection  40 mg Intravenous Q12H  . metoprolol tartrate  25 mg Oral BID  . multivitamin with minerals  1 tablet Oral Daily  . nicotine  7 mg Transdermal Daily  . sodium chloride flush  3 mL Intravenous Once  . thiamine  100 mg Oral Daily   Or  . thiamine  100 mg Intravenous Daily   Continuous Infusions: . cefTRIAXone (ROCEPHIN)  IV Stopped (04/21/20 0516)     LOS: 1 day    Time spent: 35 minutes    Sharen Hones, MD [facility]   To contact the attending provider between 7A-7P or the covering provider during after hours 7P-7A, please log into the web site [email or web address] and access using universal [place] password for that web site. If you do not have the password, please call  the hospital operator.  04/21/2020, 3:54 PM

## 2020-04-21 NOTE — ED Notes (Signed)
Pt's SO at bedside at this time. Pt continues to be A&O x4 at this time. Pt states he is feeling much better. Call bell remains within reach.

## 2020-04-21 NOTE — ED Notes (Signed)
Message sent to pharmacy regarding Lovenox, will administer when they deliver.

## 2020-04-21 NOTE — ED Notes (Signed)
This RN to bedside, pt resting in recliner for comfort, respirations even and unlabored, call bell within reach of patient at this time.

## 2020-04-21 NOTE — ED Notes (Signed)
Breathing treatment administered per MD order. Pt awakens to do breathing treatment at this time. Pt continues to rest in recliner at this time, no change in patient condition.

## 2020-04-21 NOTE — ED Notes (Signed)
Pt sitting in recliner; clothing wet with urine; urine in floor; pt undressed and assisted into hosp gown; card monitor in place with O2 at 2l/min via [place]; pt denies c/o at present and st he is more comfortable remaining in recliner at this time; pt with drooling and clear oral secretions noted; st this is normal for him from prev stroke

## 2020-04-21 NOTE — ED Notes (Signed)
Pt completed breathing treatment, this RN to bedside. Pt awakens to verbal stimuli at this time. Urinal emptied at this time. Pt awakens to verbal stimuli, A&O x4.

## 2020-04-21 NOTE — ED Notes (Signed)
Admitting MD Dr. Chipper Herb aware of patient's BP, no new orders at this time.

## 2020-04-21 NOTE — ED Notes (Signed)
Upon this RN arrival to bedside, pt noted to be acutely awake, requesting lights be turned on. Pt states he has been sleeping good because he hasn't been sleeping well at home due to SOB. Pt remains on 3L via [place] at this time. Call bell remains within reach.

## 2020-04-21 NOTE — ED Notes (Addendum)
Pt continues have HTN despite all BP meds administered and PRN BP meds administered. Admitting MD messaged via secure chat regarding BP. Awaiting orders. Pt given warm blanket by this RN. Denies further needs, continues to rest comfortably in recliner.

## 2020-04-21 NOTE — ED Notes (Signed)
Pt given meal tray at this time, SO remains at bedside. Pt continues to sit in recliner at this time. Call bell remains within reach at this time.

## 2020-04-21 NOTE — ED Notes (Signed)
Pt continues to rest in recliner, no change in patient condition.

## 2020-04-21 NOTE — ED Notes (Signed)
Pt feels anxious.  meds given   Family with pt.

## 2020-04-21 NOTE — ED Notes (Signed)
Pt in recliner asleep.  afib on monitor.

## 2020-04-21 NOTE — Progress Notes (Signed)
[facility]  ROUNDING NOTE   Subjective:   Mr. Bronco Mcgrory admitted to [facility] on 04/20/2020 for Hyponatremia [E87.1]  Patient was admitted with a serum sodium of 113. He was given tolvaptan 15mg  PO once yesterday. Sodium has improved to 122.   Objective:  Vital signs in last 24 hours:  Temp:  [97.6 F (36.4 C)-98.3 F (36.8 C)] 98.3 F (36.8 C) (05/19 0400) Pulse Rate:  [46-89] 84 (05/19 0931) Resp:  [10-23] 16 (05/19 0900) BP: (128-197)/(98-150) 179/137 (05/19 0932) SpO2:  [92 %-100 %] 98 % (05/19 0900) Weight:  [86.2 kg] 86.2 kg (05/18 1458)  Weight change:  Filed Weights   04/20/20 1458  Weight: 86.2 kg    Intake/Output: I/O last 3 completed shifts: In: 100 [IV Piggyback:100] Out: 1100 [Urine:1100]   Intake/Output this shift:  Total I/O In: -  Out: 1900 [Urine:1900]  Physical Exam: General: NAD, sitting in chair  Head: +drooling. Moist oral mucosal membranes  Eyes: Anicteric, PERRL  Neck: Supple, trachea midline  Lungs:  Bilateral diminished with crackles  Heart: irregular  Abdomen:  Soft, nontender, obese  Extremities:  ++peripheral edema.  Neurologic: Nonfocal, moving all four extremities  Skin: No lesions        Basic Metabolic Panel: Recent Labs  Lab 04/20/20 1513 04/21/20 0339  NA 113* 122*  K 4.4 4.2  CL 76* 81*  CO2 27 28  GLUCOSE 87 86  BUN 6 7  CREATININE 0.55* 0.57*  CALCIUM 8.5* 9.1    Liver Function Tests: Recent Labs  Lab 04/20/20 1513  AST 26  ALT 19  ALKPHOS 93  BILITOT 1.5*  PROT 7.7  ALBUMIN 3.9   No results for input(s): LIPASE, AMYLASE in the last 168 hours. No results for input(s): AMMONIA in the last 168 hours.  CBC: Recent Labs  Lab 04/20/20 1513 04/21/20 0437  WBC 2.8*  2.7* 2.8*  NEUTROABS 1.4* 2.3  HGB 15.4  15.1 17.1*  HCT 41.9  42.8 47.1  MCV 90.3  92.4 91.3  PLT 206  193 215    Cardiac Enzymes: No results for input(s): CKTOTAL, CKMB, CKMBINDEX, TROPONINI in the last  168 hours.  BNP: Invalid input(s): POCBNP  CBG: Recent Labs  Lab 04/20/20 1511  GLUCAP 85    Microbiology: Results for orders placed or performed during the hospital encounter of 04/20/20  SARS Coronavirus 2 by RT PCR (hospital order, performed in [facility] hospital lab) Nasopharyngeal Nasopharyngeal Swab     Status: None   Collection Time: 04/20/20  3:53 PM   Specimen: Nasopharyngeal Swab  Result Value Ref Range Status   SARS Coronavirus 2 NEGATIVE NEGATIVE Final    Comment: (NOTE) SARS-CoV-2 target nucleic acids are NOT DETECTED. The SARS-CoV-2 RNA is generally detectable in upper and lower respiratory specimens during the acute phase of infection. The lowest concentration of SARS-CoV-2 viral copies this assay can detect is 250 copies / mL. A negative result does not preclude SARS-CoV-2 infection and should not be used as the sole basis for treatment or other patient management decisions.  A negative result may occur with improper specimen collection / handling, submission of specimen other than nasopharyngeal swab, presence of viral mutation(s) within the areas targeted by this assay, and inadequate number of viral copies (<250 copies / mL). A negative result must be combined with clinical observations, patient history, and epidemiological information. Fact Sheet for Patients:   04/22/20 Fact Sheet for Healthcare Providers: BoilerBrush.com.cy This test is not yet approved or  cleared  by the [place] and has been authorized for detection and/or diagnosis of SARS-CoV-2 by FDA under an Emergency Use Authorization (EUA).  This EUA will remain in effect (meaning this test can be used) for the duration of the COVID-19 declaration under Section 564(b)(1) of the Act, 21 U.S.C. section 360bbb-3(b)(1), unless the authorization is terminated or revoked sooner. Performed at [facility], [address] [place], [place] [address]     Coagulation Studies: No results for input(s): LABPROT, INR in the last 72 hours.  Urinalysis: Recent Labs    04/20/20 1421  COLORURINE YELLOW*  LABSPEC 1.010  PHURINE 6.0  GLUCOSEU NEGATIVE  HGBUR SMALL*  BILIRUBINUR NEGATIVE  KETONESUR NEGATIVE  PROTEINUR >=300*  NITRITE NEGATIVE  LEUKOCYTESUR NEGATIVE      Imaging: DG Chest 2 View  Result Date: 04/20/2020 CLINICAL DATA:  Shortness of breath for the past week. Smoker. EXAM: CHEST - 2 VIEW COMPARISON:  03/11/2019 FINDINGS: Stable enlarged cardiac silhouette. Mild increase in prominence of the pulmonary vasculature and interstitial markings with scattered poorly defined patchy opacities in both lungs, greater on the left. Interval small to moderate-sized right pleural effusion. Unremarkable bones. IMPRESSION: 1. Interval changes of congestive heart failure with mild patchy bilateral pneumonia or alveolar edema. 2. Interval small to moderate-sized right pleural effusion. 3. Stable cardiomegaly. Electronically Signed   By: Claudie Revering M.D.   On: 04/20/2020 16:25   CT HEAD WO CONTRAST  Result Date: 04/20/2020 CLINICAL DATA:  Altered mental status EXAM: CT HEAD WITHOUT CONTRAST TECHNIQUE: Contiguous axial images were obtained from the base of the skull through the vertex without intravenous contrast. 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 There is atrophy and chronic small vessel disease changes. No acute intracranial abnormality. Specifically, no hemorrhage, hydrocephalus, mass lesion, acute infarction, or significant intracranial injury. Vascular: No hyperdense vessel or unexpected calcification. Skull: No acute calvarial abnormality. Sinuses/Orbits: Mucosal thickening in the right maxillary sinus. No air-fluid levels. Other: None IMPRESSION: No acute intracranial abnormality. Electronically Signed   By: Rolm Baptise M.D.   On: 04/20/2020 17:30      Medications:   . cefTRIAXone (ROCEPHIN)  IV Stopped (04/21/20 0516)   . amLODipine  5 mg Oral Daily  . aspirin EC  81 mg Oral Daily  . azithromycin  250 mg Oral Daily  . enoxaparin (LOVENOX) injection  1 mg/kg Subcutaneous Q12H  . folic acid  1 mg Oral Daily  . ipratropium-albuterol  3 mL Inhalation Q6H  . methylPREDNISolone (SOLU-MEDROL) injection  40 mg Intravenous Q12H  . metoprolol tartrate  25 mg Oral BID  . multivitamin with minerals  1 tablet Oral Daily  . nicotine  7 mg Transdermal Daily  . sodium chloride flush  3 mL Intravenous Once  . thiamine  100 mg Oral Daily   Or  . thiamine  100 mg Intravenous Daily   acetaminophen **OR** acetaminophen, cloNIDine, LORazepam **OR** LORazepam, ondansetron **OR** ondansetron (ZOFRAN) IV  Assessment/ Plan:  Mr. Britt Petroni is a 55 y.o. black male with hypertension, atrial fibrillation, obstructive sleep apnea status post tracheostomy, congestive heart failure, coronary artery disease, cerebrovascular accident, depression, tobacco use, who was admitted to [facility] on5/18/2021 for Hyponatremia [E87.1]  1. Hyponatremia: Hypervolemic secondary to diastolic congestive heart failure.  If serum sodium is below 126, will give a second dose of tolvaptan. Otherwise restart IV furosemide.  Do not recommend hydrochlorothiazide in this patient.  - fluid restriction  2. Acute exacerbation  of chronic diastolic congestive heart failure: outpatient regimen of furosemide 40mg  daily. Seems to have failure outpatient therapy   3. Hypertension: hypertensive urgency. Restarted metoprolol, clonidine, hydralazine, lisinopril.   4. Proteinuria: most likely secondary to hypertensive urgency.    LOS: 1 [place] Lehnen 5/19/202110:50 AM

## 2020-04-22 LAB — CBC WITH DIFFERENTIAL/PLATELET
Abs Immature Granulocytes: 0.02 10*3/uL (ref 0.00–0.07)
Basophils Absolute: 0 10*3/uL (ref 0.0–0.1)
Basophils Relative: 0 %
Eosinophils Absolute: 0 10*3/uL (ref 0.0–0.5)
Eosinophils Relative: 0 %
HCT: 43.1 % (ref 39.0–52.0)
Hemoglobin: 14.6 g/dL (ref 13.0–17.0)
Immature Granulocytes: 1 %
Lymphocytes Relative: 6 %
Lymphs Abs: 0.2 10*3/uL — ABNORMAL LOW (ref 0.7–4.0)
MCH: 32.7 pg (ref 26.0–34.0)
MCHC: 33.9 g/dL (ref 30.0–36.0)
MCV: 96.4 fL (ref 80.0–100.0)
Monocytes Absolute: 0.3 10*3/uL (ref 0.1–1.0)
Monocytes Relative: 9 %
Neutro Abs: 2.6 10*3/uL (ref 1.7–7.7)
Neutrophils Relative %: 84 %
Platelets: 201 10*3/uL (ref 150–400)
RBC: 4.47 MIL/uL (ref 4.22–5.81)
RDW: 12.9 % (ref 11.5–15.5)
WBC: 3 10*3/uL — ABNORMAL LOW (ref 4.0–10.5)
nRBC: 0 % (ref 0.0–0.2)

## 2020-04-22 LAB — BASIC METABOLIC PANEL
Anion gap: 11 (ref 5–15)
BUN: 13 mg/dL (ref 6–20)
CO2: 33 mmol/L — ABNORMAL HIGH (ref 22–32)
Calcium: 9.1 mg/dL (ref 8.9–10.3)
Chloride: 83 mmol/L — ABNORMAL LOW (ref 98–111)
Creatinine, Ser: 0.88 mg/dL (ref 0.61–1.24)
GFR calc Af Amer: >60 mL/min (ref >60)
GFR calc non Af Amer: >60 mL/min (ref >60)
Glucose, Bld: 143 mg/dL — ABNORMAL HIGH (ref 70–99)
Potassium: 4.5 mmol/L (ref 3.5–5.1)
Sodium: 127 mmol/L — ABNORMAL LOW (ref 135–145)

## 2020-04-22 LAB — ECHOCARDIOGRAM COMPLETE
Height: 71 in
Weight: 3040 oz

## 2020-04-22 LAB — TSH: TSH: 0.38 u[IU]/mL (ref 0.350–4.500)

## 2020-04-22 LAB — MAGNESIUM: Magnesium: 1.8 mg/dL (ref 1.7–2.4)

## 2020-04-22 MED ORDER — METOPROLOL TARTRATE 50 MG PO TABS
50.0000 mg | ORAL_TABLET | Freq: Two times a day (BID) | ORAL | Status: DC
  Administered 2020-04-22 – 2020-04-24 (×4): 50 mg via ORAL
  Filled 2020-04-22 (×4): qty 1

## 2020-04-22 MED ORDER — APIXABAN 5 MG PO TABS
5.0000 mg | ORAL_TABLET | Freq: Two times a day (BID) | ORAL | Status: DC
  Administered 2020-04-22 – 2020-04-24 (×5): 5 mg via ORAL
  Filled 2020-04-22 (×5): qty 1

## 2020-04-22 NOTE — ED Notes (Signed)
Per Admit [name] he will come down and reassess pt.

## 2020-04-22 NOTE — ED Notes (Signed)
Pt given water 

## 2020-04-22 NOTE — ED Notes (Addendum)
Messaged Admit MD [name] to come and reassess pt and see if he can have a different level of care.  Will wait for instructions

## 2020-04-22 NOTE — Progress Notes (Signed)
OT Cancellation Note  Patient Details Name: Andrew Sparks MRN: [id number] DOB: 04/02/1966   Cancelled Treatment:    Reason Eval/Treat Not Completed: Other (comment). Consult received, chart reviewed. Pt noted with BP 143/111, HR 104. Will hold OT evaluation and re-attempt at later date/time as pt is medically appropriate.   Richrd Prime, MPH, MS, OTR/L ascom [phone] 04/22/20, 11:13 AM

## 2020-04-22 NOTE — Progress Notes (Signed)
[facility]  ROUNDING NOTE   Subjective:   Na 89 States he is breathing better.   Objective:  Vital signs in last 24 hours:  Pulse Rate:  [67-88] 84 (05/20 0845) Resp:  [11-24] 19 (05/20 0845) BP: (125-178)/(98-129) 150/115 (05/20 0800) SpO2:  [93 %-99 %] 98 % (05/20 0845) Weight:  [86.2 kg] 86.2 kg (05/20 0837)  Weight change:  Filed Weights   04/20/20 1458 04/22/20 0837  Weight: 86.2 kg 86.2 kg    Intake/Output: I/O last 3 completed shifts: In: 1050 [P.O.:950; IV Piggyback:100] Out: 4475 [Urine:4475]   Intake/Output this shift:  Total I/O In: -  Out: 600 [Urine:600]  Physical Exam: General: NAD, sitting in chair  Head: +drooling. Moist oral mucosal membranes  Eyes: Anicteric, PERRL  Neck: Supple, trachea midline  Lungs:  Bilateral diminished with crackles  Heart: irregular  Abdomen:  Soft, nontender, obese  Extremities:  ++peripheral edema.  Neurologic: Nonfocal, moving all four extremities  Skin: No lesions        Basic Metabolic Panel: Recent Labs  Lab 04/20/20 1513 04/20/20 1513 04/21/20 0339 04/21/20 1200 04/22/20 0545  NA 113*  --  122* 125* 127*  K 4.4  --  4.2 4.2 4.5  CL 76*  --  81* 81* 83*  CO2 27  --  28 32 33*  GLUCOSE 87  --  86 116* 143*  BUN 6  --  7 7 13   CREATININE 0.55*  --  0.57* 0.54* 0.88  CALCIUM 8.5*   < > 9.1 9.0 9.1  MG  --   --   --   --  1.8   < > = values in this interval not displayed.    Liver Function Tests: Recent Labs  Lab 04/20/20 1513  AST 26  ALT 19  ALKPHOS 93  BILITOT 1.5*  PROT 7.7  ALBUMIN 3.9   No results for input(s): LIPASE, AMYLASE in the last 168 hours. No results for input(s): AMMONIA in the last 168 hours.  CBC: Recent Labs  Lab 04/20/20 1513 04/21/20 0437 04/22/20 0545  WBC 2.8*  2.7* 2.8* 3.0*  NEUTROABS 1.4* 2.3 2.6  HGB 15.4  15.1 17.1* 14.6  HCT 41.9  42.8 47.1 43.1  MCV 90.3  92.4 91.3 96.4  PLT 206  193 215 201    Cardiac Enzymes: No results for  input(s): CKTOTAL, CKMB, CKMBINDEX, TROPONINI in the last 168 hours.  BNP: Invalid input(s): POCBNP  CBG: Recent Labs  Lab 04/20/20 1511  GLUCAP 85    Microbiology: Results for orders placed or performed during the hospital encounter of 04/20/20  SARS Coronavirus 2 by RT PCR (hospital order, performed in [facility] hospital lab) Nasopharyngeal Nasopharyngeal Swab     Status: None   Collection Time: 04/20/20  3:53 PM   Specimen: Nasopharyngeal Swab  Result Value Ref Range Status   SARS Coronavirus 2 NEGATIVE NEGATIVE Final    Comment: (NOTE) SARS-CoV-2 target nucleic acids are NOT DETECTED. The SARS-CoV-2 RNA is generally detectable in upper and lower respiratory specimens during the acute phase of infection. The lowest concentration of SARS-CoV-2 viral copies this assay can detect is 250 copies / mL. A negative result does not preclude SARS-CoV-2 infection and should not be used as the sole basis for treatment or other patient management decisions.  A negative result may occur with improper specimen collection / handling, submission of specimen other than nasopharyngeal swab, presence of viral mutation(s) within the areas targeted by this assay, and  inadequate number of viral copies (<250 copies / mL). A negative result must be combined with clinical observations, patient history, and epidemiological information. Fact Sheet for Patients:   BoilerBrush.com.cy Fact Sheet for Healthcare Providers: https://pope.com/ This test is not yet approved or cleared  by the [place] FDA and has been authorized for detection and/or diagnosis of SARS-CoV-2 by FDA under an Emergency Use Authorization (EUA).  This EUA will remain in effect (meaning this test can be used) for the duration of the COVID-19 declaration under Section 564(b)(1) of the Act, 21 U.S.C. section 360bbb-3(b)(1), unless the authorization is terminated or revoked  sooner. Performed at [facility], [address] [place], [place] [address]     Coagulation Studies: No results for input(s): LABPROT, INR in the last 72 hours.  Urinalysis: Recent Labs    04/20/20 1421  COLORURINE YELLOW*  LABSPEC 1.010  PHURINE 6.0  GLUCOSEU NEGATIVE  HGBUR SMALL*  BILIRUBINUR NEGATIVE  KETONESUR NEGATIVE  PROTEINUR >=300*  NITRITE NEGATIVE  LEUKOCYTESUR NEGATIVE      Imaging: DG Chest 2 View  Result Date: 04/20/2020 CLINICAL DATA:  Shortness of breath for the past week. Smoker. EXAM: CHEST - 2 VIEW COMPARISON:  03/11/2019 FINDINGS: Stable enlarged cardiac silhouette. Mild increase in prominence of the pulmonary vasculature and interstitial markings with scattered poorly defined patchy opacities in both lungs, greater on the left. Interval small to moderate-sized right pleural effusion. Unremarkable bones. IMPRESSION: 1. Interval changes of congestive heart failure with mild patchy bilateral pneumonia or alveolar edema. 2. Interval small to moderate-sized right pleural effusion. 3. Stable cardiomegaly. Electronically Signed   By: Beckie Salts M.D.   On: 04/20/2020 16:25   CT HEAD WO CONTRAST  Result Date: 04/20/2020 CLINICAL DATA:  Altered mental status EXAM: CT HEAD WITHOUT CONTRAST TECHNIQUE: Contiguous axial images were obtained from the base of the skull through the vertex without intravenous contrast. 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 There is atrophy and chronic small vessel disease changes. No acute intracranial abnormality. Specifically, no hemorrhage, hydrocephalus, mass lesion, acute infarction, or significant intracranial injury. Vascular: No hyperdense vessel or unexpected calcification. Skull: No acute calvarial abnormality. Sinuses/Orbits: Mucosal thickening in the right maxillary sinus. No air-fluid levels. Other: None IMPRESSION: No acute intracranial abnormality.  Electronically Signed   By: Charlett Nose M.D.   On: 04/20/2020 17:30   ECHOCARDIOGRAM COMPLETE  Result Date: 04/22/2020    ECHOCARDIOGRAM REPORT   Patient Name:   Andrew Sparks Date of Exam: 04/20/2020 Medical Rec #:  [id number]              Height:       71.0 in Accession #:    [id number]             Weight:       190.0 lb Date of Birth:  1966-08-29              BSA:          2.063 m Patient Age:    54 years               BP:           162/121 mmHg Patient Gender: M                      HR:           66 bpm. Exam Location:  [facility] Procedure: 2D Echo, Cardiac Doppler and Color Doppler  Indications:     I50.31 Acute Diastolic CHF  History:         Patient has prior history of Echocardiogram examinations, most                  recent 04/21/2019. Risk Factors:Hypertension and Morbid Obesity.                  Atrial Fibrillation. Congestive heart failure. NSTEMI.                  Ventricular Tachycardia. Stroke. Sleep apnea. Shortness of                  breath. Obesity hypoventilation syndrome.  Sonographer:     Sedonia Small Rodgers-Jones Referring Phys:  [id number] Alford Highland Diagnosing Phys: Adrian Blackwater MD IMPRESSIONS  1. Left ventricular ejection fraction, by estimation, is 50 to 55%. The left ventricle has low normal function. The left ventricle has no regional wall motion abnormalities. Left ventricular diastolic parameters are consistent with Grade I diastolic dysfunction (impaired relaxation).  2. Right ventricular systolic function is normal. The right ventricular size is normal.  3. Left atrial size was mildly dilated.  4. Right atrial size was mildly dilated.  5. The mitral valve is normal in structure. Trivial mitral valve regurgitation. No evidence of mitral stenosis.  6. The aortic valve is normal in structure. Aortic valve regurgitation is not visualized. No aortic stenosis is present.  7. The inferior vena cava is normal in size with greater than 50% respiratory variability, suggesting right atrial  pressure of 3 mmHg. FINDINGS  Left Ventricle: Left ventricular ejection fraction, by estimation, is 50 to 55%. The left ventricle has low normal function. The left ventricle has no regional wall motion abnormalities. The left ventricular internal cavity size was normal in size. There is no left ventricular hypertrophy. Left ventricular diastolic parameters are consistent with Grade I diastolic dysfunction (impaired relaxation). Right Ventricle: The right ventricular size is normal. No increase in right ventricular wall thickness. Right ventricular systolic function is normal. Left Atrium: Left atrial size was mildly dilated. Right Atrium: Right atrial size was mildly dilated. Pericardium: There is no evidence of pericardial effusion. Mitral Valve: The mitral valve is normal in structure. Normal mobility of the mitral valve leaflets. Trivial mitral valve regurgitation. No evidence of mitral valve stenosis. Tricuspid Valve: The tricuspid valve is normal in structure. Tricuspid valve regurgitation is trivial. No evidence of tricuspid stenosis. Aortic Valve: The aortic valve is normal in structure. Aortic valve regurgitation is not visualized. No aortic stenosis is present. Pulmonic Valve: The pulmonic valve was normal in structure. Pulmonic valve regurgitation is not visualized. No evidence of pulmonic stenosis. Aorta: The aortic root is normal in size and structure. Venous: The inferior vena cava is normal in size with greater than 50% respiratory variability, suggesting right atrial pressure of 3 mmHg. IAS/Shunts: No atrial level shunt detected by color flow Doppler.  LEFT VENTRICLE PLAX 2D LVIDd:         4.29 cm LVIDs:         3.09 cm LV PW:         1.27 cm LV IVS:        1.26 cm LVOT diam:     2.70 cm LVOT Area:     5.73 cm  RIGHT VENTRICLE RV Basal diam:  4.35 cm RV S prime:     8.92 cm/s TAPSE (M-mode): 2.5 cm LEFT ATRIUM  Index       RIGHT ATRIUM           Index LA diam:        5.80 cm  2.81 cm/m   RA Area:     39.20 cm LA Vol (A2C):   134.0 ml 64.95 ml/m RA Volume:   164.00 ml 79.49 ml/m LA Vol (A4C):   105.0 ml 50.89 ml/m LA Biplane Vol: 120.0 ml 58.17 ml/m   AORTA Ao Root diam: 5.10 cm Ao Asc diam:  4.30 cm MV E velocity: 131.00 cm/s                             SHUNTS                             Systemic Diam: 2.70 cm Neoma Laming MD Electronically signed by Neoma Laming MD Signature Date/Time: 04/22/2020/9:21:54 AM    Final      Medications:   . cefTRIAXone (ROCEPHIN)  IV Stopped (04/22/20 0250)   . amLODipine  10 mg Oral Daily  . aspirin EC  81 mg Oral Daily  . azithromycin  250 mg Oral Daily  . enoxaparin (LOVENOX) injection  1 mg/kg Subcutaneous Q12H  . folic acid  1 mg Oral Daily  . furosemide  40 mg Intravenous Q12H  . ipratropium-albuterol  3 mL Inhalation Q6H  . methylPREDNISolone (SOLU-MEDROL) injection  40 mg Intravenous Q12H  . metoprolol tartrate  25 mg Oral BID  . multivitamin with minerals  1 tablet Oral Daily  . nicotine  7 mg Transdermal Daily  . sodium chloride flush  3 mL Intravenous Once  . thiamine  100 mg Oral Daily   Or  . thiamine  100 mg Intravenous Daily   acetaminophen **OR** acetaminophen, cloNIDine, LORazepam **OR** LORazepam, ondansetron **OR** ondansetron (ZOFRAN) IV  Assessment/ Plan:  [name] is a 54 y.o. black male with hypertension, atrial fibrillation, obstructive sleep apnea status post tracheostomy, congestive heart failure, coronary artery disease, cerebrovascular accident, depression, tobacco use, who was admitted to [facility] on5/18/2021 for Hyponatremia [E87.1]  1. Hyponatremia: Hypervolemic secondary to diastolic congestive heart failure.  If serum sodium is below 126, will give a second dose of tolvaptan.  - IV furosemide. Do not recommend hydrochlorothiazide in this patient.  - fluid restriction  2. Acute exacerbation of chronic diastolic congestive heart failure: and acute exacerbation of COPD outpatient  regimen of PO furosemide 40mg  daily. Seems to have failure outpatient therapy  - IV furosemide  3. Hypertension: hypertensive urgency. Restarted metoprolol, clonidine, hydralazine, lisinopril.   4. Proteinuria: most likely secondary to hypertensive urgency.    LOS: 2 Eyob Godlewski 5/20/202110:22 AM

## 2020-04-22 NOTE — Progress Notes (Addendum)
PROGRESS NOTE    Andrew Sparks  QMG:[id number] DOB: 1966/09/28 DOA: 04/20/2020 PCP: Jodi Marble, MD   Chief complaint.  Shortness of breath.  Brief Narrative:  [name] a54 y.o.malestates family called EMS to pick him up. He states for the past week he has been swollen and feeling very weak. He always has some shortness of breath. He chronically wears 3 L of oxygen. He does have some wheezing. He has nausea vomiting and poor appetite. He has had some chills but no fever. Feeling very fatigued. In the ER, he was found to have a sodium of 113 and chest x-ray looks like fluid overload. Hospitalist services were contacted for further evaluation. He also had a CT scan of the head which was negative for acute infarct.  Patient also complaining of increased short of breath for the last week, he also had a significant leg edema.  Upon arrival in the hospital, nephrology consult was obtained for hyponatremia, he was given a dose of tolvaptan and discontinue hydrochlorothiazide.  5/20.  Sodium level 127.  Short of breath improving, continue IV Lasix.     Assessment & Plan:   Active Problems:   Obstructive sleep apnea syndrome   Hyponatremia   Acute on chronic diastolic CHF (congestive heart failure) ([place])  #1.  Hyponatremia with hypervolemia. Sodium level gradually going up with diuretics.  Continue to follow.  2.  Acute on chronic diastolic congestive heart failure. Echocardiogram performed yesterday showed ejection fraction 50 to 55%.  Patient still have significant leg edema, anasarca, continue IV Lasix.  3.  Hypertension emergency. Blood pressure still running high, increase metoprolol to 50 mg once a day, continue increased dose of Norvasc 10 mg daily.  4.  Chronic atrial fibrillation. Rate under control.  I will discontinue Lovenox, start oral Eliquis for stroke prevention.  5.  COPD exacerbation with chronic hypoxemic respiratory  failure. Patient currently does not have any bronchospasm, discontinue steroids.  Low suspicion for bacterial infection, discontinue Rocephin.  6.  Obstructive sleep apnea. Not compliant with CPAP.   7.  Alcohol abuse.  Continue thiamine folic acid, no evidence of alcohol withdrawal.  8.  Leukopenia. Stable.  9.  Generalized weakness. Start physical therapy occupational therapy.     DVT prophylaxis: Eliquis Code Status: Full Family Communication: None Disposition Plan:   Patient came from: Home                                                                                                                           Anticipated d/c place: Home  Barriers to d/c OR conditions which need to be met to effect a safe d/c:   Consultants:   Nephrology  Procedures: None Antimicrobials:  Zithromax    Subjective: Patient still has significant edema.  But feel much better today with shortness of breath.  He is more awake today, no confusion. No abdominal pain or nausea vomiting.  No constipation or diarrhea. No fever or chills. No  difficulty with urination.   Objective: Vitals:   04/22/20 0930 04/22/20 0945 04/22/20 1000 04/22/20 1029  BP:   (!) 143/111 (!) 143/111  Pulse: (!) 107 97 (!) 102 90  Resp: _0 Temp:      TempSrc:      SpO2: 94% 97% 98%   Weight:      Height:        Intake/Output Summary (Last 24 hours) at 04/22/2020 1102 Last data filed at 04/22/2020 0840 Gross per 24 hour  Intake 950 ml  Output 2075 ml  Net -1125 ml   Filed Weights   04/20/20 1458 04/22/20 0837  Weight: 86.2 kg 86.2 kg    Examination:  General exam: Appears calm and comfortable  Respiratory system: Clear to auscultation. Respiratory effort normal. Cardiovascular system: Irregular, no JVD, murmurs, rubs, gallops or clicks. 3+ BLE edema. Gastrointestinal system: Abdomen is nondistended, soft and nontender. No organomegaly or masses felt. Normal bowel sounds  heard. Central nervous system: Alert and oriented. No focal neurological deficits. Extremities: Symmetric 5 x 5 power. Skin: No rashes, lesions or ulcers Psychiatry: Judgement and insight appear normal. Mood & affect appropriate.     Data Reviewed: I have personally reviewed following labs and imaging studies  CBC: Recent Labs  Lab 04/20/20 1513 04/21/20 0437 04/22/20 0545  WBC 2.8*  2.7* 2.8* 3.0*  NEUTROABS 1.4* 2.3 2.6  HGB 15.4  15.1 17.1* 14.6  HCT 41.9  42.8 47.1 43.1  MCV 90.3  92.4 91.3 96.4  PLT 206  193 215 403   Basic Metabolic Panel: Recent Labs  Lab 04/20/20 1513 04/21/20 0339 04/21/20 1200 04/22/20 0545  NA 113* 122* 125* 127*  K 4.4 4.2 4.2 4.5  CL 76* 81* 81* 83*  CO2 27 28 32 33*  GLUCOSE 87 86 116* 143*  BUN _1 CREATININE 0.55* 0.57* 0.54* 0.88  CALCIUM 8.5* 9.1 9.0 9.1  MG  --   --   --  1.8   GFR: Estimated Creatinine Clearance: 102.2 mL/min (by C-G formula based on SCr of 0.88 mg/dL). Liver Function Tests: Recent Labs  Lab 04/20/20 1513  AST 26  ALT 19  ALKPHOS 93  BILITOT 1.5*  PROT 7.7  ALBUMIN 3.9   No results for input(s): LIPASE, AMYLASE in the last 168 hours. No results for input(s): AMMONIA in the last 168 hours. Coagulation Profile: No results for input(s): INR, PROTIME in the last 168 hours. Cardiac Enzymes: No results for input(s): CKTOTAL, CKMB, CKMBINDEX, TROPONINI in the last 168 hours. BNP (last 3 results) No results for input(s): PROBNP in the last 8760 hours. HbA1C: No results for input(s): HGBA1C in the last 72 hours. CBG: Recent Labs  Lab 04/20/20 1511  GLUCAP 85   Lipid Profile: Recent Labs    04/21/20 0300  CHOL 191  HDL 82  LDLCALC 100*  TRIG 47  CHOLHDL 2.3   Thyroid Function Tests: Recent Labs    04/22/20 0545  TSH 0.380   Anemia Panel: No results for input(s): VITAMINB12, FOLATE, FERRITIN, TIBC, IRON, RETICCTPCT in the last 72 hours. Sepsis Labs: No results for input(s):  PROCALCITON, LATICACIDVEN in the last 168 hours.  Recent Results (from the past 240 hour(s))  SARS Coronavirus 2 by RT PCR (hospital order, performed in [facility] hospital lab) Nasopharyngeal Nasopharyngeal Swab     Status: None   Collection Time: 04/20/20  3:53 PM   Specimen: Nasopharyngeal Swab  Result Value Ref Range Status  SARS Coronavirus 2 NEGATIVE NEGATIVE Final    Comment: (NOTE) SARS-CoV-2 target nucleic acids are NOT DETECTED. The SARS-CoV-2 RNA is generally detectable in upper and lower respiratory specimens during the acute phase of infection. The lowest concentration of SARS-CoV-2 viral copies this assay can detect is 250 copies / mL. A negative result does not preclude SARS-CoV-2 infection and should not be used as the sole basis for treatment or other patient management decisions.  A negative result may occur with improper specimen collection / handling, submission of specimen other than nasopharyngeal swab, presence of viral mutation(s) within the areas targeted by this assay, and inadequate number of viral copies (<250 copies / mL). A negative result must be combined with clinical observations, patient history, and epidemiological information. Fact Sheet for Patients:   StrictlyIdeas.no Fact Sheet for Healthcare Providers: BankingDealers.co.za This test is not yet approved or cleared  by the [place] FDA and has been authorized for detection and/or diagnosis of SARS-CoV-2 by FDA under an Emergency Use Authorization (EUA).  This EUA will remain in effect (meaning this test can be used) for the duration of the COVID-19 declaration under Section 564(b)(1) of the Act, 21 U.S.C. section 360bbb-3(b)(1), unless the authorization is terminated or revoked sooner. Performed at [facility], [address] [place], [place] [address]          Radiology Studies: DG Chest 2 View  Result Date:  04/20/2020 CLINICAL DATA:  Shortness of breath for the past week. Smoker. EXAM: CHEST - 2 VIEW COMPARISON:  03/11/2019 FINDINGS: Stable enlarged cardiac silhouette. Mild increase in prominence of the pulmonary vasculature and interstitial markings with scattered poorly defined patchy opacities in both lungs, greater on the left. Interval small to moderate-sized right pleural effusion. Unremarkable bones. IMPRESSION: 1. Interval changes of congestive heart failure with mild patchy bilateral pneumonia or alveolar edema. 2. Interval small to moderate-sized right pleural effusion. 3. Stable cardiomegaly. Electronically Signed   By: Claudie Revering M.D.   On: 04/20/2020 16:25   CT HEAD WO CONTRAST  Result Date: 04/20/2020 CLINICAL DATA:  Altered mental status EXAM: CT HEAD WITHOUT CONTRAST TECHNIQUE: Contiguous axial images were obtained from the base of the skull through the vertex without intravenous contrast. 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 There is atrophy and chronic small vessel disease changes. No acute intracranial abnormality. Specifically, no hemorrhage, hydrocephalus, mass lesion, acute infarction, or significant intracranial injury. Vascular: No hyperdense vessel or unexpected calcification. Skull: No acute calvarial abnormality. Sinuses/Orbits: Mucosal thickening in the right maxillary sinus. No air-fluid levels. Other: None IMPRESSION: No acute intracranial abnormality. Electronically Signed   By: Rolm Baptise M.D.   On: 04/20/2020 17:30   ECHOCARDIOGRAM COMPLETE  Result Date: 04/22/2020    ECHOCARDIOGRAM REPORT   Patient Name:   CALEL PISARSKI Date of Exam: 04/20/2020 Medical Rec #:  [id number]              Height:       71.0 in Accession #:    [id number]             Weight:       190.0 lb Date of Birth:  1966-03-21              BSA:          2.063 m Patient Age:    69 years               BP:  162/121 mmHg Patient Gender:  M                      HR:           66 bpm. Exam Location:  [facility] Procedure: 2D Echo, Cardiac Doppler and Color Doppler Indications:     O35.00 Acute Diastolic CHF  History:         Patient has prior history of Echocardiogram examinations, most                  recent 04/21/2019. Risk Factors:Hypertension and Morbid Obesity.                  Atrial Fibrillation. Congestive heart failure. NSTEMI.                  Ventricular Tachycardia. Stroke. Sleep apnea. Shortness of                  breath. Obesity hypoventilation syndrome.  Sonographer:     Wilford Sports Rodgers-Jones Referring Phys:  [id number] Loletha Grayer Diagnosing Phys: Neoma Laming MD IMPRESSIONS  1. Left ventricular ejection fraction, by estimation, is 50 to 55%. The left ventricle has low normal function. The left ventricle has no regional wall motion abnormalities. Left ventricular diastolic parameters are consistent with Grade I diastolic dysfunction (impaired relaxation).  2. Right ventricular systolic function is normal. The right ventricular size is normal.  3. Left atrial size was mildly dilated.  4. Right atrial size was mildly dilated.  5. The mitral valve is normal in structure. Trivial mitral valve regurgitation. No evidence of mitral stenosis.  6. The aortic valve is normal in structure. Aortic valve regurgitation is not visualized. No aortic stenosis is present.  7. The inferior vena cava is normal in size with greater than 50% respiratory variability, suggesting right atrial pressure of 3 mmHg. FINDINGS  Left Ventricle: Left ventricular ejection fraction, by estimation, is 50 to 55%. The left ventricle has low normal function. The left ventricle has no regional wall motion abnormalities. The left ventricular internal cavity size was normal in size. There is no left ventricular hypertrophy. Left ventricular diastolic parameters are consistent with Grade I diastolic dysfunction (impaired relaxation). Right Ventricle: The right ventricular size is  normal. No increase in right ventricular wall thickness. Right ventricular systolic function is normal. Left Atrium: Left atrial size was mildly dilated. Right Atrium: Right atrial size was mildly dilated. Pericardium: There is no evidence of pericardial effusion. Mitral Valve: The mitral valve is normal in structure. Normal mobility of the mitral valve leaflets. Trivial mitral valve regurgitation. No evidence of mitral valve stenosis. Tricuspid Valve: The tricuspid valve is normal in structure. Tricuspid valve regurgitation is trivial. No evidence of tricuspid stenosis. Aortic Valve: The aortic valve is normal in structure. Aortic valve regurgitation is not visualized. No aortic stenosis is present. Pulmonic Valve: The pulmonic valve was normal in structure. Pulmonic valve regurgitation is not visualized. No evidence of pulmonic stenosis. Aorta: The aortic root is normal in size and structure. Venous: The inferior vena cava is normal in size with greater than 50% respiratory variability, suggesting right atrial pressure of 3 mmHg. IAS/Shunts: No atrial level shunt detected by color flow Doppler.  LEFT VENTRICLE PLAX 2D LVIDd:         4.29 cm LVIDs:         3.09 cm LV PW:         1.27 cm LV IVS:  1.26 cm LVOT diam:     2.70 cm LVOT Area:     5.73 cm  RIGHT VENTRICLE RV Basal diam:  4.35 cm RV S prime:     8.92 cm/s TAPSE (M-mode): 2.5 cm LEFT ATRIUM              Index       RIGHT ATRIUM           Index LA diam:        5.80 cm  2.81 cm/m  RA Area:     39.20 cm LA Vol (A2C):   134.0 ml 64.95 ml/m RA Volume:   164.00 ml 79.49 ml/m LA Vol (A4C):   105.0 ml 50.89 ml/m LA Biplane Vol: 120.0 ml 58.17 ml/m   AORTA Ao Root diam: 5.10 cm Ao Asc diam:  4.30 cm MV E velocity: 131.00 cm/s                             SHUNTS                             Systemic Diam: 2.70 cm Neoma Laming MD Electronically signed by Neoma Laming MD Signature Date/Time: 04/22/2020/9:21:54 AM    Final         Scheduled Meds: .  amLODipine  10 mg Oral Daily  . aspirin EC  81 mg Oral Daily  . azithromycin  250 mg Oral Daily  . enoxaparin (LOVENOX) injection  1 mg/kg Subcutaneous Q12H  . folic acid  1 mg Oral Daily  . furosemide  40 mg Intravenous Q12H  . ipratropium-albuterol  3 mL Inhalation Q6H  . metoprolol tartrate  50 mg Oral BID  . multivitamin with minerals  1 tablet Oral Daily  . nicotine  7 mg Transdermal Daily  . sodium chloride flush  3 mL Intravenous Once  . thiamine  100 mg Oral Daily   Or  . thiamine  100 mg Intravenous Daily   Continuous Infusions: . cefTRIAXone (ROCEPHIN)  IV Stopped (04/22/20 0250)     LOS: 2 days    Time spent: 35 minutes    Sharen Hones, MD [facility]   To contact the attending provider between 7A-7P or the covering provider during after hours 7P-7A, please log into the web site [email or web address] and access using universal [place] password for that web site. If you do not have the password, please call the hospital operator.  04/22/2020, 11:02 AM

## 2020-04-22 NOTE — ED Notes (Signed)
PT given diet soda

## 2020-04-22 NOTE — ED Notes (Signed)
Attempted to call floor for pt's room assignment. Per [employer] they are not getting this pt. They are getting another pt.   Will inform Charge RN Tammy Sours

## 2020-04-22 NOTE — Evaluation (Signed)
Physical Therapy Evaluation Patient Details Name: Andrew Sparks MRN: [id number] DOB: 1966-10-09 Today's Date: 04/22/2020   History of Present Illness  54 y.o. male states family called EMS to pick him up.  He states for the past week he has been swollen and feeling very weak.  He always has some shortness of breath.  He chronically wears 3 L of oxygen.  He does have some wheezing.  On arrival pt with low sodium and elevated BP, at time of PT exam these are improving but still not ideal - medicine working on this  Clinical Impression  Pt did well with all aspects of mobility and safety.  He was able to easily and safely ambulate ~200 ft without AD and w/o LOBs or excessive fatigue.  He had some mild shortness of breath (O2 dropped from high 90s on 3L at rest to 85% post ambulation on room air (RA per pt's "normal" of taking O2 off to go into store, etc).  Pt showed good strength, good overall confidence and is in agreement that he will not require further PT intervention here or when he discharges home.     Follow Up Recommendations No PT follow up    Equipment Recommendations  None recommended by PT    Recommendations for Other Services       Precautions / Restrictions Precautions Precautions: None Restrictions Weight Bearing Restrictions: No      Mobility  Bed Mobility Overal bed mobility: Independent                Transfers Overall transfer level: Independent Equipment used: None             General transfer comment: Pt able to rise to standing and maintain balance w/o assist  Ambulation/Gait Ambulation/Gait assistance: Independent Gait Distance (Feet): 200 Feet Assistive device: None       General Gait Details: Pt with minimal stiffness apparent in LEs, but ultimately did well and showed good overall confidence and safety; reports he is at/near his baseline.  Of note, pt states "I can take my O2 off to walk, I usually do in stores and stuff."  His  sats did drop from the high 90s on 3L to 85% post ambulation on room air.  Stairs            Wheelchair Mobility    Modified Rankin (Stroke Patients Only)       Balance Overall balance assessment: Independent                                           Pertinent Vitals/Pain Pain Assessment: No/denies pain    Home Living Family/patient expects to be discharged to:: Private residence Living Arrangements: Alone(girlfriend can and does stay with him some) Available Help at Discharge: Family   Home Access: Stairs to enter   Technical brewer of Steps: 2 Home Layout: One level        Prior Function Level of Independence: Independent         Comments: Pt is able to run errands with GF (does not drive), does his cooking, ADLs, etc w/o issue     Hand Dominance        Extremity/Trunk Assessment   Upper Extremity Assessment Upper Extremity Assessment: Overall WFL for tasks assessed    Lower Extremity Assessment Lower Extremity Assessment: Overall WFL for tasks assessed(b/l LEs with continued significant  swelling)       Communication   Communication: No difficulties  Cognition Arousal/Alertness: Awake/alert Behavior During Therapy: WFL for tasks assessed/performed Overall Cognitive Status: Within Functional Limits for tasks assessed                                        General Comments General comments (skin integrity, edema, etc.): Pt has been having elevated BP, still 156/122 at beginning of PT session, spoke with nursing who approves modest effort    Exercises     Assessment/Plan    PT Assessment Patent does not need any further PT services  PT Problem List         PT Treatment Interventions      PT Goals (Current goals can be found in the Care Plan section)  Acute Rehab PT Goals Patient Stated Goal: go home tomorrow PT Goal Formulation: All assessment and education complete, DC therapy    Frequency      Barriers to discharge        Co-evaluation               AM-PAC PT "6 Clicks" Mobility  Outcome Measure Help needed turning from your back to your side while in a flat bed without using bedrails?: None Help needed moving from lying on your back to sitting on the side of a flat bed without using bedrails?: None Help needed moving to and from a bed to a chair (including a wheelchair)?: None Help needed standing up from a chair using your arms (e.g., wheelchair or bedside chair)?: None Help needed to walk in hospital room?: None Help needed climbing 3-5 steps with a railing? : None 6 Click Score: 24    End of Session Equipment Utilized During Treatment: Gait belt;Oxygen(3L (baseline) did ambulate on room air per pt "normal") Activity Tolerance: Patient tolerated treatment well Patient left: in chair;with call bell/phone within reach Nurse Communication: Mobility status(vitals) PT Visit Diagnosis: Muscle weakness (generalized) (M62.81);Difficulty in walking, not elsewhere classified (R26.2)    Time: 3785-8850 PT Time Calculation (min) (ACUTE ONLY): 16 min   Charges:   PT Evaluation $PT Eval Low Complexity: 1 Low          Malachi Pro, DPT 04/22/2020, 4:52 PM

## 2020-04-22 NOTE — ED Notes (Signed)
Provided pt w/ Ice H2O per request. Lunch plate arrived.

## 2020-04-23 LAB — RENAL FUNCTION PANEL
Albumin: 3.7 g/dL (ref 3.5–5.0)
Anion gap: 11 (ref 5–15)
BUN: 13 mg/dL (ref 6–20)
CO2: 37 mmol/L — ABNORMAL HIGH (ref 22–32)
Calcium: 9.3 mg/dL (ref 8.9–10.3)
Chloride: 81 mmol/L — ABNORMAL LOW (ref 98–111)
Creatinine, Ser: 0.72 mg/dL (ref 0.61–1.24)
GFR calc Af Amer: >60 mL/min (ref >60)
GFR calc non Af Amer: >60 mL/min (ref >60)
Glucose, Bld: 115 mg/dL — ABNORMAL HIGH (ref 70–99)
Phosphorus: 4.4 mg/dL (ref 2.5–4.6)
Potassium: 3.7 mmol/L (ref 3.5–5.1)
Sodium: 129 mmol/L — ABNORMAL LOW (ref 135–145)

## 2020-04-23 MED ORDER — FLUOXETINE HCL 20 MG PO CAPS
20.0000 mg | ORAL_CAPSULE | Freq: Every day | ORAL | Status: DC
  Administered 2020-04-23 – 2020-04-24 (×2): 20 mg via ORAL
  Filled 2020-04-23 (×2): qty 1

## 2020-04-23 MED ORDER — ALPRAZOLAM 0.5 MG PO TABS
0.5000 mg | ORAL_TABLET | ORAL | Status: AC
  Administered 2020-04-23: 0.5 mg via ORAL
  Filled 2020-04-23: qty 1

## 2020-04-23 NOTE — Progress Notes (Signed)
PROGRESS NOTE    Andrew Sparks  CXK:[id number] DOB: April 02, 1966 DOA: 04/20/2020 PCP: Jodi Marble, MD   Brief Narrative:  Andrew Sparks y.o.malestates family called EMS to pick him up. He states for the past week he has been swollen and feeling very weak. He always has some shortness of breath. He chronically wears 3 L of oxygen. He does have some wheezing. He has nausea vomiting and poor appetite. He has had some chills but no fever. Feeling very fatigued. In the ER, he was found to have a sodium of 113 and chest x-ray looks like fluid overload. Hospitalist services were contacted for further evaluation. He also had a CT scan of the head which was negative for acute infarct.  Patient also complaining of increased short of breath for the last week, he also had a significant leg edema. Upon arrival in the hospital, nephrology consult was obtained for hyponatremia, he was given a dose of tolvaptan and discontinue hydrochlorothiazide.  5/20.  Sodium level 127.  Short of breath improving, continue IV Lasix.   Assessment & Plan:   Active Problems:   Obstructive sleep apnea syndrome   Hyponatremia   Acute on chronic diastolic CHF (congestive heart failure) ([place])  #1. Hyponatremia with hypovolemia. Condition improving, continue fluid restriction and IV Lasix.  #2 appear acute on chronic diastolic congestive heart failure. Continue IV Lasix for another 24 hours.  Condition improving.  #3.  Hypertension emergency. Continue current treatment with increased dose of metoprolol and Norvasc.  4.  Chronic atrial fibrillation. Continue Eliquis for stroke prevention.  5.  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 Condition stable, patient still on 3 L oxygen.  6.  Obstructive sleep apnea.  Not compliant with CPAP.  7.  Alcohol abuse. No evidence of withdrawal.  Continue thiamine and folic acid.  8.  Leukopenia. Likely due to  alcohol.  Condition improving.   DVT prophylaxis:Eliquis Code Status:Full Family Communication:None Disposition Plan:  Patient came from:Home  Anticipated d/c place:Home  Barriers to d/c OR conditions which need to be met to effect a safe d/c:   Consultants:  Nephrology  Procedures:None Antimicrobials: Zithromax    Subjective: Patient still has short of breath with exertion.  Was able to walk with the physical therapist in the hallway.  No cough. No abdominal pain or nausea vomiting. No fever or chills. No dysuria hematuria.  Objective: Vitals:   04/23/20 0228 04/23/20 0407 04/23/20 0600 04/23/20 0829  BP:  (!) 186/129 (!) 144/111 (!) 151/127  Pulse:  80  81  Resp:   18 15  Temp:  98 F (36.7 C)  98.1 F (36.7 C)  TempSrc:  Oral  Oral  SpO2: 99% 100%  91%  Weight:  120.1 kg    Height:        Intake/Output Summary (Last 24 hours) at 04/23/2020 1112 Last data filed at 04/23/2020 1038 Gross per 24 hour  Intake 600 ml  Output 2850 ml  Net -2250 ml   Filed Weights   04/22/20 0837 04/22/20 1514 04/23/20 0407  Weight: 86.2 kg 121.8 kg 120.1 kg    Examination:  General exam: Appears calm and comfortable  Respiratory system: Coarse breathing sounds. Respiratory effort normal. Cardiovascular system: Irregular. No JVD, murmurs, rubs, gallops or clicks. 3+ pedal edema. Gastrointestinal system: Abdomen is nondistended, soft and nontender. No organomegaly or masses felt. Normal bowel sounds heard. Central nervous system: Alert and oriented. No focal neurological deficits. Extremities: Symmetric  Skin: No rashes, lesions or  ulcers Psychiatry: Judgement and insight appear normal. Mood & affect appropriate.     Data Reviewed: I have personally reviewed following labs and imaging studies  CBC: Recent Labs  Lab 04/20/20 1513  04/21/20 0437 04/22/20 0545  WBC 2.8*  2.7* 2.8* 3.0*  NEUTROABS 1.4* 2.3 2.6  HGB 15.4  15.1 17.1* 14.6  HCT 41.9  42.8 47.1 43.1  MCV 90.3  92.4 91.3 96.4  PLT 206  193 215 962   Basic Metabolic Panel: Recent Labs  Lab 04/20/20 1513 04/21/20 0339 04/21/20 1200 04/22/20 0545 04/23/20 0927  NA 113* 122* 125* 127* 129*  K 4.4 4.2 4.2 4.5 3.7  CL 76* 81* 81* 83* 81*  CO2 27 28 32 33* 37*  GLUCOSE 87 86 116* 143* 115*  BUN _0 CREATININE 0.55* 0.57* 0.54* 0.88 0.72  CALCIUM 8.5* 9.1 9.0 9.1 9.3  MG  --   --   --  1.8  --   PHOS  --   --   --   --  4.4   GFR: Estimated Creatinine Clearance: 139.2 mL/min (by C-G formula based on SCr of 0.72 mg/dL). Liver Function Tests: Recent Labs  Lab 04/20/20 1513 04/23/20 0927  AST 26  --   ALT 19  --   ALKPHOS 93  --   BILITOT 1.5*  --   PROT 7.7  --   ALBUMIN 3.9 3.7   No results for input(s): LIPASE, AMYLASE in the last 168 hours. No results for input(s): AMMONIA in the last 168 hours. Coagulation Profile: No results for input(s): INR, PROTIME in the last 168 hours. Cardiac Enzymes: No results for input(s): CKTOTAL, CKMB, CKMBINDEX, TROPONINI in the last 168 hours. BNP (last 3 results) No results for input(s): PROBNP in the last 8760 hours. HbA1C: No results for input(s): HGBA1C in the last 72 hours. CBG: Recent Labs  Lab 04/20/20 1511  GLUCAP 85   Lipid Profile: Recent Labs    04/21/20 0300  CHOL 191  HDL 82  LDLCALC 100*  TRIG 47  CHOLHDL 2.3   Thyroid Function Tests: Recent Labs    04/22/20 0545  TSH 0.380   Anemia Panel: No results for input(s): VITAMINB12, FOLATE, FERRITIN, TIBC, IRON, RETICCTPCT in the last 72 hours. Sepsis Labs: No results for input(s): PROCALCITON, LATICACIDVEN in the last 168 hours.  Recent Results (from the past 240 hour(s))  SARS Coronavirus 2 by RT PCR (hospital order, performed in [facility] hospital lab) Nasopharyngeal Nasopharyngeal Swab     Status: None     Collection Time: 04/20/20  3:53 PM   Specimen: Nasopharyngeal Swab  Result Value Ref Range Status   SARS Coronavirus 2 NEGATIVE NEGATIVE Final    Comment: (NOTE) SARS-CoV-2 target nucleic acids are NOT DETECTED. The SARS-CoV-2 RNA is generally detectable in upper and lower respiratory specimens during the acute phase of infection. The lowest concentration of SARS-CoV-2 viral copies this assay can detect is 250 copies / mL. A negative result does not preclude SARS-CoV-2 infection and should not be used as the sole basis for treatment or other patient management decisions.  A negative result may occur with improper specimen collection / handling, submission of specimen other than nasopharyngeal swab, presence of viral mutation(s) within the areas targeted by this assay, and inadequate number of viral copies (<250 copies / mL). A negative result must be combined with clinical observations, patient history, and epidemiological information. Fact Sheet for Patients:   StrictlyIdeas.no Fact Sheet for  Healthcare Providers: BankingDealers.co.za This test is not yet approved or cleared  by the [place] and has been authorized for detection and/or diagnosis of SARS-CoV-2 by FDA under an Emergency Use Authorization (EUA).  This EUA will remain in effect (meaning this test can be used) for the duration of the COVID-19 declaration under Section 564(b)(1) of the Act, 21 U.S.C. section 360bbb-3(b)(1), unless the authorization is terminated or revoked sooner. Performed at [facility], [address] [place], [place] [address]          Radiology Studies: No results found.      Scheduled Meds: . amLODipine  10 mg Oral Daily  . apixaban  5 mg Oral BID  . aspirin EC  81 mg Oral Daily  . azithromycin  250 mg Oral Daily  . folic acid  1 mg Oral Daily  . furosemide  40 mg Intravenous Q12H  . ipratropium-albuterol  3 mL  Inhalation Q6H  . metoprolol tartrate  50 mg Oral BID  . multivitamin with minerals  1 tablet Oral Daily  . nicotine  7 mg Transdermal Daily  . sodium chloride flush  3 mL Intravenous Once  . thiamine  100 mg Oral Daily   Or  . thiamine  100 mg Intravenous Daily   Continuous Infusions:   LOS: 3 days    Time spent: 28 minutes    Sharen Hones, MD [facility]   To contact the attending provider between 7A-7P or the covering provider during after hours 7P-7A, please log into the web site [email or web address] and access using universal [place] password for that web site. If you do not have the password, please call the hospital operator.  04/23/2020, 11:12 AM

## 2020-04-23 NOTE — Progress Notes (Signed)
OT Cancellation Note  Patient Details Name: Christoph Copelan MRN: [id number] DOB: 1966-11-09   Cancelled Treatment:    Reason Eval/Treat Not Completed: OT screened, no needs identified, will sign off. OT order received and chart reviewed. Per PT evaluation and conversation c pt, pt is at baseline for ADLs and has no identified skilled acute OT needs. Will sign off, please re-consult if new needs arise.   Kathie Dike, M.S. OTR/L  04/23/20, 10:00 AM

## 2020-04-23 NOTE — Plan of Care (Signed)
Pt up to chair during shift, tolerating PO intake. No needs expressed. Scheduled medication provided, BP monitored. No falls this shift  Plan for discharge tomorrow.   Problem: Education: Goal: Knowledge of General Education information will improve Description: Including pain rating scale, medication(s)/side effects and non-pharmacologic comfort measures Outcome: Progressing   Problem: Health Behavior/Discharge Planning: Goal: Ability to manage health-related needs will improve Outcome: Progressing   Problem: Clinical Measurements: Goal: Ability to maintain clinical measurements within normal limits will improve Outcome: Progressing Goal: Respiratory complications will improve Outcome: Progressing   Problem: Nutrition: Goal: Adequate nutrition will be maintained Outcome: Progressing   Problem: Education: Goal: Ability to demonstrate management of disease process will improve Outcome: Progressing Goal: Ability to verbalize understanding of medication therapies will improve Outcome: Progressing

## 2020-04-23 NOTE — Care Management Important Message (Signed)
Important Message  Patient Details  Name: Andrew Sparks MRN: [id number] Date of Birth: [birth year]   Medicare Important Message Given:  Yes  Initial Medicare IM given by Patient Access Associate on 04/22/2020 at 2:53am.     Johnell Comings 04/23/2020, 8:18 AM

## 2020-04-23 NOTE — Progress Notes (Signed)
OT Cancellation Note  Patient Details Name: Andrew Sparks MRN: [id number] DOB: February 15, 1966   Cancelled Treatment:    Reason Eval/Treat Not Completed: OT screened, no needs identified, will sign off. New OT orders received, per chart review pt has had no change in status since this AM. Pt remains at baseline for ADLs and has no identified skilled acute OT needs. Will sign off, please re-consult if new acute OT needs arise.   Kathie Dike, M.S. OTR/L  04/23/20, 1:25 PM

## 2020-04-23 NOTE — Progress Notes (Signed)
[facility]  ROUNDING NOTE   Subjective:   Na 129 States he is breathing better. Sitting in chair.   Objective:  Vital signs in last 24 hours:  Temp:  [98 F (36.7 C)-98.3 F (36.8 C)] 98.1 F (36.7 C) (05/21 0829) Pulse Rate:  [52-127] 81 (05/21 0829) Resp:  [11-20] 15 (05/21 0829) BP: (143-186)/(111-133) 151/127 (05/21 0829) SpO2:  [91 %-100 %] 91 % (05/21 0829) Weight:  [120.1 kg-121.8 kg] 120.1 kg (05/21 0407)  Weight change:  Filed Weights   04/22/20 0837 04/22/20 1514 04/23/20 0407  Weight: 86.2 kg 121.8 kg 120.1 kg    Intake/Output: I/O last 3 completed shifts: In: 15 [P.O.:950] Out: 3050 [Urine:3050]   Intake/Output this shift:  Total I/O In: -  Out: 400 [Urine:400]  Physical Exam: General: NAD, sitting in chair  Head: Moist oral mucosal membranes  Eyes: Anicteric, PERRL  Neck: Supple, trachea midline  Lungs:  Bilateral diminished with crackles  Heart: irregular  Abdomen:  Soft, nontender, obese  Extremities:  +peripheral edema.  Neurologic: Nonfocal, moving all four extremities  Skin: No lesions        Basic Metabolic Panel: Recent Labs  Lab 04/20/20 1513 04/20/20 1513 04/21/20 0339 04/21/20 0339 04/21/20 1200 04/22/20 0545 04/23/20 0927  NA 113*  --  122*  --  125* 127* 129*  K 4.4  --  4.2  --  4.2 4.5 3.7  CL 76*  --  81*  --  81* 83* 81*  CO2 27  --  28  --  32 33* 37*  GLUCOSE 87  --  86  --  116* 143* 115*  BUN 6  --  7  --  7 13 13   CREATININE 0.55*  --  0.57*  --  0.54* 0.88 0.72  CALCIUM 8.5*   < > 9.1   < > 9.0 9.1 9.3  MG  --   --   --   --   --  1.8  --   PHOS  --   --   --   --   --   --  4.4   < > = values in this interval not displayed.    Liver Function Tests: Recent Labs  Lab 04/20/20 1513 04/23/20 0927  AST 26  --   ALT 19  --   ALKPHOS 93  --   BILITOT 1.5*  --   PROT 7.7  --   ALBUMIN 3.9 3.7   No results for input(s): LIPASE, AMYLASE in the last 168 hours. No results for input(s):  AMMONIA in the last 168 hours.  CBC: Recent Labs  Lab 04/20/20 1513 04/21/20 0437 04/22/20 0545  WBC 2.8*  2.7* 2.8* 3.0*  NEUTROABS 1.4* 2.3 2.6  HGB 15.4  15.1 17.1* 14.6  HCT 41.9  42.8 47.1 43.1  MCV 90.3  92.4 91.3 96.4  PLT 206  193 215 201    Cardiac Enzymes: No results for input(s): CKTOTAL, CKMB, CKMBINDEX, TROPONINI in the last 168 hours.  BNP: Invalid input(s): POCBNP  CBG: Recent Labs  Lab 04/20/20 1511  GLUCAP 4    Microbiology: Results for orders placed or performed during the hospital encounter of 04/20/20  SARS Coronavirus 2 by RT PCR (hospital order, performed in [facility] hospital lab) Nasopharyngeal Nasopharyngeal Swab     Status: None   Collection Time: 04/20/20  3:53 PM   Specimen: Nasopharyngeal Swab  Result Value Ref Range Status   SARS Coronavirus 2 NEGATIVE NEGATIVE  Final    Comment: (NOTE) SARS-CoV-2 target nucleic acids are NOT DETECTED. The SARS-CoV-2 RNA is generally detectable in upper and lower respiratory specimens during the acute phase of infection. The lowest concentration of SARS-CoV-2 viral copies this assay can detect is 250 copies / mL. A negative result does not preclude SARS-CoV-2 infection and should not be used as the sole basis for treatment or other patient management decisions.  A negative result may occur with improper specimen collection / handling, submission of specimen other than nasopharyngeal swab, presence of viral mutation(s) within the areas targeted by this assay, and inadequate number of viral copies (<250 copies / mL). A negative result must be combined with clinical observations, patient history, and epidemiological information. Fact Sheet for Patients:   BoilerBrush.com.cy Fact Sheet for Healthcare Providers: https://pope.com/ This test is not yet approved or cleared  by the [place] FDA and has been authorized for detection and/or diagnosis of  SARS-CoV-2 by FDA under an Emergency Use Authorization (EUA).  This EUA will remain in effect (meaning this test can be used) for the duration of the COVID-19 declaration under Section 564(b)(1) of the Act, 21 U.S.C. section 360bbb-3(b)(1), unless the authorization is terminated or revoked sooner. Performed at [facility], [address] [place], [place] [address]     Coagulation Studies: No results for input(s): LABPROT, INR in the last 72 hours.  Urinalysis: Recent Labs    04/20/20 1421  COLORURINE YELLOW*  LABSPEC 1.010  PHURINE 6.0  GLUCOSEU NEGATIVE  HGBUR SMALL*  BILIRUBINUR NEGATIVE  KETONESUR NEGATIVE  PROTEINUR >=300*  NITRITE NEGATIVE  LEUKOCYTESUR NEGATIVE      Imaging: No results found.   Medications:    . amLODipine  10 mg Oral Daily  . apixaban  5 mg Oral BID  . aspirin EC  81 mg Oral Daily  . azithromycin  250 mg Oral Daily  . folic acid  1 mg Oral Daily  . furosemide  40 mg Intravenous Q12H  . ipratropium-albuterol  3 mL Inhalation Q6H  . metoprolol tartrate  50 mg Oral BID  . multivitamin with minerals  1 tablet Oral Daily  . nicotine  7 mg Transdermal Daily  . sodium chloride flush  3 mL Intravenous Once  . thiamine  100 mg Oral Daily   Or  . thiamine  100 mg Intravenous Daily   acetaminophen **OR** acetaminophen, cloNIDine, LORazepam **OR** LORazepam, ondansetron **OR** ondansetron (ZOFRAN) IV  Assessment/ Plan:  [name] is a 54 y.o. black male with hypertension, atrial fibrillation, obstructive sleep apnea status post tracheostomy, congestive heart failure, coronary artery disease, cerebrovascular accident, depression, tobacco use, who was admitted to [facility] on5/18/2021 for Acute hyponatremia [E87.1] Hyponatremia [E87.1] Pleural effusion on right [J90] Chronic respiratory failure with hypoxia (HCC) [J96.11] Generalized weakness [R53.1] Acute on chronic diastolic CHF (congestive heart failure) (HCC)  [I50.33] Acute on chronic congestive heart failure, unspecified heart failure type (HCC) [I50.9]  1. Hyponatremia: Hypervolemic secondary to diastolic congestive heart failure.  - Continue IV furosemide. Do not recommend hydrochlorothiazide in this patient.  - Continue fluid restriction  2. Acute exacerbation of chronic diastolic congestive heart failure: and acute exacerbation of COPD outpatient regimen of PO furosemide 40mg  daily. Seems to have failure outpatient therapy  - IV furosemide  3. Hypertension: hypertensive urgency. Restarted metoprolol, clonidine, hydralazine, lisinopril.   4. Proteinuria: most likely secondary to hypertensive urgency.    LOS: 3 Sarath Kolluru 5/21/202110:15 AM

## 2020-04-24 LAB — CBC WITH DIFFERENTIAL/PLATELET
Abs Immature Granulocytes: 0.02 10*3/uL (ref 0.00–0.07)
Basophils Absolute: 0 10*3/uL (ref 0.0–0.1)
Basophils Relative: 1 %
Eosinophils Absolute: 0.1 10*3/uL (ref 0.0–0.5)
Eosinophils Relative: 2 %
HCT: 43 % (ref 39.0–52.0)
Hemoglobin: 14.3 g/dL (ref 13.0–17.0)
Immature Granulocytes: 1 %
Lymphocytes Relative: 19 %
Lymphs Abs: 0.8 10*3/uL (ref 0.7–4.0)
MCH: 32.4 pg (ref 26.0–34.0)
MCHC: 33.3 g/dL (ref 30.0–36.0)
MCV: 97.5 fL (ref 80.0–100.0)
Monocytes Absolute: 0.6 10*3/uL (ref 0.1–1.0)
Monocytes Relative: 13 %
Neutro Abs: 2.9 10*3/uL (ref 1.7–7.7)
Neutrophils Relative %: 64 %
Platelets: 168 10*3/uL (ref 150–400)
RBC: 4.41 MIL/uL (ref 4.22–5.81)
RDW: 13.2 % (ref 11.5–15.5)
WBC: 4.4 10*3/uL (ref 4.0–10.5)
nRBC: 0 % (ref 0.0–0.2)

## 2020-04-24 LAB — BASIC METABOLIC PANEL
Anion gap: 10 (ref 5–15)
BUN: 15 mg/dL (ref 6–20)
CO2: 39 mmol/L — ABNORMAL HIGH (ref 22–32)
Calcium: 9 mg/dL (ref 8.9–10.3)
Chloride: 81 mmol/L — ABNORMAL LOW (ref 98–111)
Creatinine, Ser: 0.75 mg/dL (ref 0.61–1.24)
GFR calc Af Amer: >60 mL/min (ref >60)
GFR calc non Af Amer: >60 mL/min (ref >60)
Glucose, Bld: 87 mg/dL (ref 70–99)
Potassium: 4 mmol/L (ref 3.5–5.1)
Sodium: 130 mmol/L — ABNORMAL LOW (ref 135–145)

## 2020-04-24 LAB — MAGNESIUM: Magnesium: 1.7 mg/dL (ref 1.7–2.4)

## 2020-04-24 MED ORDER — APIXABAN 5 MG PO TABS: 5.0000 mg | ORAL_TABLET | Freq: Two times a day (BID) | ORAL | 0 refills | Status: DC

## 2020-04-24 MED ORDER — NICOTINE 7 MG/24HR TD PT24: 7.0000 mg | MEDICATED_PATCH | Freq: Every day | TRANSDERMAL | 0 refills | Status: DC

## 2020-04-24 MED ORDER — FUROSEMIDE 40 MG PO TABS: 40.0000 mg | ORAL_TABLET | Freq: Two times a day (BID) | ORAL | 11 refills | Status: DC

## 2020-04-24 NOTE — Discharge Instructions (Signed)
Heart Failure, Self Care Heart failure is a serious condition. This sheet explains things you need to do to take care of yourself at home. To help you stay as healthy as possible, you may be asked to change your diet, take certain medicines, and make other changes in your life. Your doctor may also give you more specific instructions. If you have problems or questions, call your doctor. What are the risks? Having heart failure makes it more likely for you to have some problems. These problems can get worse if you do not take good care of yourself. Problems may include:  Blood clotting problems. This may cause a stroke.  Damage to the kidneys, liver, or lungs.  Abnormal heart rhythms. Supplies needed:  Scale for weighing yourself.  Blood pressure monitor.  Notebook.  Medicines. How to care for yourself when you have heart failure Medicines Take over-the-counter and prescription medicines only as told by your doctor. Take your medicines every day.  Do not stop taking your medicine unless your doctor tells you to do so.  Do not skip any medicines.  Get your prescriptions refilled before you run out of medicine. This is important. Eating and drinking   Eat heart-healthy foods. Talk with a diet specialist (dietitian) to create an eating plan.  Choose foods that: ? Have no trans fat. ? Are low in saturated fat and cholesterol.  Choose healthy foods, such as: ? Fresh or frozen fruits and vegetables. ? Fish. ? Low-fat (lean) meats. ? Legumes, such as beans, peas, and lentils. ? Fat-free or low-fat dairy products. ? Whole-grain foods. ? High-fiber foods.  Limit salt (sodium) if told by your doctor. Ask your diet specialist to tell you which seasonings are healthy for your heart.  Cook in healthy ways instead of frying. Healthy ways of cooking include roasting, grilling, broiling, baking, poaching, steaming, and stir-frying.  Limit how much fluid you drink, if told by your  doctor. Alcohol use  Do not drink alcohol if: ? Your doctor tells you not to drink. ? Your heart was damaged by alcohol, or you have very bad heart failure. ? You are pregnant, may be pregnant, or are planning to become pregnant.  If you drink alcohol: ? Limit how much you use to:  0-1 drink a day for women.  0-2 drinks a day for men. ? Be aware of how much alcohol is in your drink. In the [place] one drink equals one 12 oz bottle of beer (355 mL), one 5 oz glass of wine (148 mL), or one 1 oz glass of hard liquor (44 mL). Lifestyle   Do not use any products that contain nicotine or tobacco, such as cigarettes, e-cigarettes, and chewing tobacco. If you need help quitting, ask your doctor. ? Do not use nicotine gum or patches before talking to your doctor.  Do not use illegal drugs.  Lose weight if told by your doctor.  Do physical activity if told by your doctor. Talk to your doctor before you begin an exercise if: ? You are an older adult. ? You have very bad heart failure.  Learn to manage stress. If you need help, ask your doctor.  Get rehab (rehabilitation) to help you stay independent and to help with your quality of life.  Plan time to rest when you get tired. Check weight and blood pressure   Weigh yourself every day. This will help you to know if fluid is building up in your body. ? Weigh yourself every morning   after you pee (urinate) and before you eat breakfast. ? Wear the same amount of clothing each time. ? Write down your daily weight. Give your record to your doctor.  Check and write down your blood pressure as told by your doctor.  Check your pulse as told by your doctor. Dealing with very hot and very cold weather  If it is very hot: ? Avoid activities that take a lot of energy. ? Use air conditioning or fans, or find a cooler place. ? Avoid caffeine and alcohol. ? Wear clothing that is loose-fitting, lightweight, and light-colored.  If it is very  cold: ? Avoid activities that take a lot of energy. ? Layer your clothes. ? Wear mittens or gloves, a hat, and a scarf when you go outside. ? Avoid alcohol. Follow these instructions at home:  Stay up to date with shots (vaccines). Get pneumococcal and flu (influenza) shots.  Keep all follow-up visits as told by your doctor. This is important. Contact a doctor if:  You gain weight quickly.  You have increasing shortness of breath.  You cannot do your normal activities.  You get tired easily.  You cough a lot.  You don't feel like eating or feel like you may vomit (nauseous).  You become puffy (swell) in your hands, feet, ankles, or belly (abdomen).  You cannot sleep well because it is hard to breathe.  You feel like your heart is beating fast (palpitations).  You get dizzy when you stand up. Get help right away if:  You have trouble breathing.  You or someone else notices a change in your behavior, such as having trouble staying awake.  You have chest pain or discomfort.  You pass out (faint). These symptoms may be an emergency. Do not wait to see if the symptoms will go away. Get medical help right away. Call your local emergency services (911 in the [place] Do not drive yourself to the hospital. Summary  Heart failure is a serious condition. To care for yourself, you may have to change your diet, take medicines, and make other lifestyle changes.  Take your medicines every day. Do not stop taking them unless your doctor tells you to do so.  Eat heart-healthy foods, such as fresh or frozen fruits and vegetables, fish, lean meats, legumes, fat-free or low-fat dairy products, and whole-grain or high-fiber foods.  Ask your doctor if you can drink alcohol. You may have to stop alcohol use if you have very bad heart failure.  Contact your doctor if you gain weight quickly or feel that your heart is beating too fast. Get help right away if you pass out, or have chest pain  or trouble breathing. This information is not intended to replace advice given to you by your health care provider. Make sure you discuss any questions you have with your health care provider. Document Revised: 03/04/2019 Document Reviewed: 03/05/2019 Elsevier Patient Education  2020 [employer]   1.  Fluid restriction less than 1500 mL/day 2.  Daily weight, call PCP if weight gain. 3.  Follow-up with primary care physician in 1 week.

## 2020-04-24 NOTE — Progress Notes (Signed)
Andrew Sparks  MRN: [id number]  DOB/AGE: Sep 17, 1966 54 y.o.  Primary Care Physician:Tejan-Sie, Marcelino Freestone, MD  Admit date: 04/20/2020  Chief Complaint:  Chief Complaint  Patient presents with  . Weakness    S-Pt presented on  04/20/2020 with  Chief Complaint  Patient presents with  . Weakness  . Patient sitting comfortably on the chair.  Patient asked me relevant question about whether he should drink more water less water whether he should eat more salt or less salt.  Educated patient about the correlation of salt concentration to the water intake and for him to restrict his water intake.  Patient voiced understanding  Medications . amLODipine  10 mg Oral Daily  . apixaban  5 mg Oral BID  . aspirin EC  81 mg Oral Daily  . FLUoxetine  20 mg Oral Daily  . folic acid  1 mg Oral Daily  . furosemide  40 mg Intravenous Q12H  . ipratropium-albuterol  3 mL Inhalation Q6H  . metoprolol tartrate  50 mg Oral BID  . multivitamin with minerals  1 tablet Oral Daily  . nicotine  7 mg Transdermal Daily  . sodium chloride flush  3 mL Intravenous Once  . thiamine  100 mg Oral Daily   Or  . thiamine  100 mg Intravenous Daily         VQQ:VZDGL from the symptoms mentioned above,there are no other symptoms referable to all systems reviewed.  Physical Exam: Vital signs in last 24 hours: Temp:  [97.7 F (36.5 C)-97.8 F (36.6 C)] 97.7 F (36.5 C) (05/22 0425) Pulse Rate:  [65-78] 78 (05/22 0824) Resp:  [16-20] 18 (05/22 0824) BP: (130-156)/(100-117) 155/112 (05/22 0824) SpO2:  [97 %-100 %] 100 % (05/22 0824) Weight:  [118.1 kg] 118.1 kg (05/22 0425) Weight change: 31.9 kg Last BM Date: 04/23/20  Intake/Output from previous day: 05/21 0701 - 05/22 0700 In: 720 [P.O.:720] Out: 2350 [Urine:2350] No intake/output data recorded.   Physical Exam: General- pt is awake,alert, oriented to time place and person Resp- No acute REsp distress, Rhonchi present CVS- S1S2 regular in  rate and rhythm GIT- BS+, soft, NT, ND EXT-2+ LE Edema, Cyanosis   Lab Results: CBC Recent Labs    04/22/20 0545 04/24/20 0717  WBC 3.0* 4.4  HGB 14.6 14.3  HCT 43.1 43.0  PLT 201 168    BMET Recent Labs    04/23/20 0927 04/24/20 0717  NA 129* 130*  K 3.7 4.0  CL 81* 81*  CO2 37* 39*  GLUCOSE 115* 87  BUN 13 15  CREATININE 0.72 0.75  CALCIUM 9.3 9.0    Sodium trend 2021 May 18 113 May 19 122 May 20 127 May 21 129 May 22 130  In 2020 admission May 17 127 May 19 132 Apr 30, 2028 May 21 134  MICRO Recent Results (from the past 240 hour(s))  SARS Coronavirus 2 by RT PCR (hospital order, performed in [facility] hospital lab) Nasopharyngeal Nasopharyngeal Swab     Status: None   Collection Time: 04/20/20  3:53 PM   Specimen: Nasopharyngeal Swab  Result Value Ref Range Status   SARS Coronavirus 2 NEGATIVE NEGATIVE Final    Comment: (NOTE) SARS-CoV-2 target nucleic acids are NOT DETECTED. The SARS-CoV-2 RNA is generally detectable in upper and lower respiratory specimens during the acute phase of infection. The lowest concentration of SARS-CoV-2 viral copies this assay can detect is 250 copies / mL. A negative result does not preclude SARS-CoV-2 infection  and should not be used as the sole basis for treatment or other patient management decisions.  A negative result may occur with improper specimen collection / handling, submission of specimen other than nasopharyngeal swab, presence of viral mutation(s) within the areas targeted by this assay, and inadequate number of viral copies (<250 copies / mL). A negative result must be combined with clinical observations, patient history, and epidemiological information. Fact Sheet for Patients:   StrictlyIdeas.no Fact Sheet for Healthcare Providers: BankingDealers.co.za This test is not yet approved or cleared  by the [place] FDA and has been authorized for  detection and/or diagnosis of SARS-CoV-2 by FDA under an Emergency Use Authorization (EUA).  This EUA will remain in effect (meaning this test can be used) for the duration of the COVID-19 declaration under Section 564(b)(1) of the Act, 21 U.S.C. section 360bbb-3(b)(1), unless the authorization is terminated or revoked sooner. Performed at [facility], [address] [place], [place] [address]       Lab Results  Component Value Date   CALCIUM 9.0 04/24/2020   CAION 1.11 (L) 04/20/2019   PHOS 4.4 04/23/2020               Impression:  Patient is a 54 year old African-American male with past medical history hypertension, defibrillation, obstructive sleep apnea, s/p ostomy, CHF, CAD, CVA, depression, tobacco abuse who was admitted to the hospital on May 18 for chief complaint of acute hyponatremia, pleural effusion, chronic respiratory failure with hypoxia, generalized weakness, acute on chronic CHF.  1) hyponatremia Patient had acute hyponatremia secondary to hypervolemia Patient responded well to diuretics Patient sodium is trending up Patient sodium up trended slowly-less than 8--10 meq in 24 hours May 18 113 May 19 122 May 20 127 May 21 129 May 22 130   2)HTN Blood pressure is at goal   3) acute exacerbation of chronic CHF Patient is now clinically much better   4) proteinuria Most likely secondary to hypertension Patient will benefit from outpatient nephrology follow-up   5) renal Patient has CKD stage I most likely secondary to hypertension Data in favor of CKD as patient has proteinuria  6)Acid base Co2 at goal     Plan:  Agree with current treatment plan Patient will benefit from outpatient nephrology follow-up upon discharge.      Dorrene Bently s Theador Hawthorne 04/24/2020, 9:35 AM

## 2020-04-24 NOTE — Discharge Summary (Signed)
Physician Discharge Summary  Patient ID: Haidyn Chadderdon MRN: [id number] DOB/AGE: May 20, 1966 54 y.o.  Admit date: 04/20/2020 Discharge date: 04/24/2020  Admission Diagnoses:  Discharge Diagnoses:  Active Problems:   Obstructive sleep apnea syndrome   Hyponatremia   Acute on chronic diastolic CHF (congestive heart failure) [facility])   Discharged Condition: good  Hospital Course: [name] a54 y.o.malestates family called EMS to pick him up. He states for the past week he has been swollen and feeling very weak. He always has some shortness of breath. He chronically wears 3 L of oxygen. He does have some wheezing. He has nausea vomiting and poor appetite. He has had some chills but no fever. Feeling very fatigued. In the ER, he was found to have a sodium of 113 and chest x-ray looks like fluid overload. Hospitalist services were contacted for further evaluation. He also had a CT scan of the head which was negative for acute infarct.  Patient also complaining of increased short of breath for the last week, he also had a significant leg edema. Upon arrival in the hospital, nephrology consult was obtained for hyponatremia, he was given a dose of tolvaptan and discontinue hydrochlorothiazide.  5/20.Sodium level 127. Short of breath improving, continue IV Lasix.   #1. Hyponatremia with hypovolemia. Patient was giving IV Lasix, condition has improved.  Sodium level increased to 130.  Volume status is better.  #2. Acute on chronic diastolic congestive heart failure. Echocardiogram performed on 04/20/2020 showed ejection fraction 50 to 55%.  Condition had improved.  Patient is advised to follow-up with heart failure clinic within 1 week.  Fluid restriction, and daily weight check.  #3.  Hypertension emergency. Resume all home medicines.  4.  Chronic atrial fibrillation. Continue Eliquis for stroke prevention.  5.  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 Condition stable, patient still on 3 L oxygen, which is baseline  6.  Obstructive sleep apnea.  Not compliant with CPAP.  7.  Alcohol abuse. No evidence of withdrawal.    8.  Leukopenia. Likely due to alcohol.  Condition improving.   Consults: nephrology  Significant Diagnostic Studies:  Echocardiogram showed ejection fraction 50 to 55%.  Treatments: IV Lasix.  Discharge Exam: Blood pressure (!) 155/112, pulse 78, temperature 97.7 F (36.5 C), temperature source Oral, resp. rate 18, height 5\' 11"  (1.803 m), weight 118.1 kg, SpO2 100 %. General appearance: alert and cooperative Resp: clear to auscultation bilaterally Cardio: Irregular, no murmurs. GI: soft, non-tender; bowel sounds normal; no masses,  no organomegaly Extremities: edema 3+  Disposition: Discharge disposition: 01-Home or Self Care       Discharge Instructions    Diet - low sodium heart healthy   Complete by: As directed    Fluid restriction 1553ml/day   Increase activity slowly   Complete by: As directed      Allergies as of 04/24/2020   No Known Allergies     Medication List    TAKE these medications   amiodarone 200 MG tablet Commonly known as: PACERONE Take 200 mg by mouth daily.   apixaban 5 MG Tabs tablet Commonly known as: ELIQUIS Take 1 tablet (5 mg total) by mouth 2 (two) times daily.   FLUoxetine 20 MG capsule Commonly known as: PROZAC Take 20 mg by mouth daily.   furosemide 40 MG tablet Commonly known as: Lasix Take 1 tablet (40 mg total) by mouth 2 (two) times daily.   hydrALAZINE 100 MG tablet Commonly known as: APRESOLINE Take 100 mg by  mouth 3 (three) times daily.   lisinopril 20 MG tablet Commonly known as: ZESTRIL Take 20 mg by mouth daily.   Lopressor 50 MG tablet Generic drug: metoprolol tartrate Take 75 mg by mouth 2 (two) times daily.   nicotine 7 mg/24hr patch Commonly known as: NICODERM CQ - dosed in mg/24 hr Place 1 patch (7  mg total) onto the skin daily. Start taking on: Apr 25, 2020      Follow-up Information    [facility] Follow up on 04/28/2020.   Specialty: Cardiology Why: at [place] through the [place] entrance Contact information: [address] [place]       Jodi Marble, MD Follow up in 1 week(s).   Specialty: Internal Medicine Contact information: [place] [address] [phone]         38 minutes  Signed: Sharen Hones 04/24/2020, 10:13 AM

## 2020-04-26 ENCOUNTER — Telehealth: Payer: Self-pay | Admitting: Family

## 2020-04-26 NOTE — Telephone Encounter (Signed)
Spoke with patient who says he is doing well since discharged from the hospital. He is taking his medications as prescribed minus one he is unable to afford and cannot remember the name. He has not started exercising yet but plans too. He has no symptoms or complaints at this time and is doing his best to follow a low sodium diet and at this time does not have a scale at home to check his weight daily.   Andrew Sparks, [place]

## 2020-04-27 NOTE — Progress Notes (Signed)
Patient ID: Andrew Sparks, male    DOB: March 14, 1966, 54 y.o.   MRN: [id number]  HPI  Andrew Sparks is a 54 y/o male with a history of HTN, stroke, anxiety, obesity hypoventilation syndrome, sleep apnea, depression, NSTEMI, VT, atrial fibrillation, previous trach, current tobacco / alcohol use and chronic heart failure.   Echo report from 04/20/20 reviewed and showed an EF of 50-55% along with mild LAE.  Catheterization done 09/10/18 and showed: Normal coronaries, RCA had anterior take off and was non selective and normal LVEF.  Admitted 04/20/20 due to acute on chronic HF along with hyponatremia. Given IV lasix along with tolvaptan. Initial sodium level was 113. Head CT negative for stroke. Discharged after 4 days.   He presents today for his initial visit with a chief complaint of minimal shortness of breath upon moderate exertion. He describes this as chronic in nature having been present for several years. He has associated fatigue, head congestion, pedal edema (improving), leg pain and difficulty sleeping along with this. He denies dizziness, abdominal distention, palpitations, chest pain or cough.   Does not have scales at home. Wears oxygen at bedtime but does not wear CPAP but says that they've talked about doing a sleep study.   Hasn't been taking his eliquis because he says the pharmacy told him it would be ~ $500. He also says that he completely misses taking his medications 2-3 days/ week because he gets busy and forgets and then doesn't want to double up on them.    Past Medical History:  Diagnosis Date  . A-fib (HCC)   . Anxiety   . CHF (congestive heart failure) (HCC)   . Depression   . Hypersomnia with sleep apnea   . Hypertension   . Morbid obesity (HCC)   . NSTEMI (non-ST elevated myocardial infarction) (HCC) 2005  . Obesity hypoventilation syndrome (HCC)   . Shortness of breath dyspnea   . Sleep apnea   . Stroke (HCC)   . V-tach [facility])    Past Surgical History:   Procedure Laterality Date  . LEFT HEART CATH AND CORONARY ANGIOGRAPHY Right 09/10/2018   Procedure: LEFT HEART CATH AND CORONARY ANGIOGRAPHY with possible percutaneous intervention;  Surgeon: Laurier Nancy, MD;  Location: [facility] INVASIVE CV LAB;  Service: Cardiovascular;  Laterality: Right;  . TRACHEOSTOMY     Family History  Problem Relation Age of Onset  . Heart disease Father   . Diabetes type II Mother   . Breast cancer Mother    Social History   Tobacco Use  . Smoking status: Current Every Day Smoker    Packs/day: 0.25    Years: 28.00    Pack years: 7.00    Types: Cigarettes  . Smokeless tobacco: Never Used  Substance Use Topics  . Alcohol use: Yes    Alcohol/week: 4.0 standard drinks    Types: 4 Cans of beer per week   No Known Allergies Prior to Admission medications   Medication Sig Start Date End Date Taking? Authorizing Provider  amiodarone (PACERONE) 200 MG tablet Take 200 mg by mouth daily.   Yes [provider]  aspirin EC 81 MG tablet Take 81 mg by mouth daily.   Yes [provider]  FLUoxetine (PROZAC) 20 MG capsule Take 20 mg by mouth daily.   Yes [provider]  furosemide (LASIX) 40 MG tablet Take 1 tablet (40 mg total) by mouth 2 (two) times daily. 04/24/20 04/24/21 Yes Marrion Coy, MD  hydrALAZINE (APRESOLINE)  100 MG tablet Take 100 mg by mouth 3 (three) times daily.   Yes [provider]  lisinopril (ZESTRIL) 20 MG tablet Take 20 mg by mouth daily.   Yes [provider]  metoprolol tartrate (LOPRESSOR) 50 MG tablet Take 75 mg by mouth 2 (two) times daily.   Yes [provider]  apixaban (ELIQUIS) 5 MG TABS tablet Take 1 tablet (5 mg total) by mouth 2 (two) times daily. Patient not taking: Reported on 04/28/2020 04/24/20   Sharen Hones, MD     Review of Systems  Constitutional: Positive for fatigue. Negative for appetite change.  HENT: Positive for congestion. Negative for postnasal drip and sore throat.    Eyes: Negative.   Respiratory: Positive for shortness of breath (with moderate exertion). Negative for cough and wheezing.   Cardiovascular: Positive for leg swelling. Negative for chest pain and palpitations.  Gastrointestinal: Negative for abdominal distention and abdominal pain.  Endocrine: Negative.   Genitourinary: Negative.   Musculoskeletal: Positive for arthralgias (leg pain). Negative for back pain.  Skin: Negative.   Allergic/Immunologic: Negative.   Neurological: Negative for dizziness and light-headedness.  Hematological: Negative for adenopathy. Does not bruise/bleed easily.  Psychiatric/Behavioral: Positive for sleep disturbance (sleeping on 1 pillow). Negative for dysphoric mood. The patient is not nervous/anxious.    Vitals:   04/28/20 0903 04/28/20 0935  BP: (!) 150/108 122/82  Pulse: 96   Resp: 20   SpO2: 91%   Weight: 253 lb 2 oz (114.8 kg)   Height: 5\' 11"  (1.803 m)    Wt Readings from Last 3 Encounters:  04/28/20 253 lb 2 oz (114.8 kg)  04/24/20 260 lb 4.8 oz (118.1 kg)  12/23/19 263 lb (119.3 kg)   Lab Results  Component Value Date   CREATININE 0.75 04/24/2020   CREATININE 0.72 04/23/2020   CREATININE 0.88 04/22/2020    Physical Exam Vitals and nursing note reviewed.  Constitutional:      Appearance: Normal appearance.  HENT:     Head: Normocephalic and atraumatic.  Cardiovascular:     Rate and Rhythm: Normal rate and regular rhythm.  Pulmonary:     Effort: Pulmonary effort is normal. No respiratory distress.     Breath sounds: No wheezing or rales.  Abdominal:     General: There is no distension.     Palpations: Abdomen is soft.  Musculoskeletal:        General: No tenderness.     Cervical back: Normal range of motion and neck supple.     Right lower leg: Edema (2+ pitting) present.     Left lower leg: Edema (2+ pitting) present.  Skin:    General: Skin is warm and dry.  Neurological:     General: No focal deficit present.     Mental  Status: He is alert and oriented to person, place, and time.  Psychiatric:        Mood and Affect: Mood normal.        Behavior: Behavior normal.        Thought Content: Thought content normal.    Assessment & Plan:  1: Chronic heart failure with preserved ejection fraction with mild structural changes- - NYHA class II - euvolemic today - does not have scales so a set was given to him today; instructed to weigh every morning after using the bathroom and call for an overnight weight gain of >2 pounds or a weekly weight gain of >5 pounds - not adding salt and says that  he rinses his canned foods off 2-3 times before using; does try to use fresh vegetables when possible; reviewed the importance of reading food labels and written dietary information was given to him (does have a history of severe hyponatremia) - per patient, has never seen cardiologist so an appointment was scheduled with Dr. Adrian Blackwater on 05/04/20 - BNP 03/11/2019 was 1006.0 - PharmD reconciled medications with the patient  2: HTN- - BP elevated initially (150/108) but had improved greatly upon recheck with manual cuff at the end of the visit (122/82) - f/u appointment scheduled with PCP (Tejan-Sie) on 05/04/20 - BMP 04/24/20 reviewed and showed sodium 130, potassium 4.0, creatinine 0.75 and GFR >60  3: COPD- - continues to smoke 4-5 cigarettes daily; was unable to afford nicoderm patches - wears oxygen at 3L at bedtime (does not have CPAP)  4: Atrial fibrillation- - was unable to afford eliquis as he says his pharmacy says they are missing paperwork for 3rd party payor; advised patient to contact his medicaid social worker who should be able to help him - 30 day voucher for eliquis given to patient so that he can get started back on it - has already had previous stroke  5: Alcohol use- - continues to drink 4-5 twenty-ounce cans of beer every other day - cessation discussed for 3 minutes with him  6: Lymphedema- - stage  2 - quite active and does a lot of walking - encouraged him to elevate his legs when sitting for long periods of time as well as get compression socks to put on every morning with removal at bedtime - consider lymphapress compression boots if edema persists  Patient says that he misses his medications completely 2-3 days out of the week as he forgets and then doesn't want to take them too close together. Explained that on those days, he could take his morning medications by noon and then take the 2nd doses closer to bedtime. Not sure he could take the 3rd hydralazine on those days but at least he wouldn't be missing days completely. If this continues, consider paramedicine referral for assistance.   Patient did not bring his medications nor a list. Each medication was verbally reviewed with the patient and he was encouraged to bring the bottles to every visit to confirm accuracy of list.  Return in 3 weeks or sooner for any questions/problems before then.

## 2020-04-28 ENCOUNTER — Ambulatory Visit: Payer: Medicare Other | Attending: Family | Admitting: Family

## 2020-04-28 ENCOUNTER — Other Ambulatory Visit: Payer: Self-pay

## 2020-04-28 ENCOUNTER — Encounter: Payer: Self-pay | Admitting: Family

## 2020-04-28 VITALS — BP 122/82 | HR 96 | Resp 20 | Ht 71.0 in | Wt 253.1 lb

## 2020-04-28 DIAGNOSIS — J449 Chronic obstructive pulmonary disease, unspecified: Secondary | ICD-10-CM | POA: Diagnosis not present

## 2020-04-28 DIAGNOSIS — R0602 Shortness of breath: Secondary | ICD-10-CM | POA: Diagnosis present

## 2020-04-28 DIAGNOSIS — I890 Lymphedema, not elsewhere classified: Secondary | ICD-10-CM | POA: Insufficient documentation

## 2020-04-28 DIAGNOSIS — M79606 Pain in leg, unspecified: Secondary | ICD-10-CM | POA: Diagnosis not present

## 2020-04-28 DIAGNOSIS — I110 Hypertensive heart disease with heart failure: Secondary | ICD-10-CM | POA: Insufficient documentation

## 2020-04-28 DIAGNOSIS — F17210 Nicotine dependence, cigarettes, uncomplicated: Secondary | ICD-10-CM | POA: Insufficient documentation

## 2020-04-28 DIAGNOSIS — I252 Old myocardial infarction: Secondary | ICD-10-CM | POA: Diagnosis not present

## 2020-04-28 DIAGNOSIS — I5032 Chronic diastolic (congestive) heart failure: Secondary | ICD-10-CM

## 2020-04-28 DIAGNOSIS — I4891 Unspecified atrial fibrillation: Secondary | ICD-10-CM | POA: Insufficient documentation

## 2020-04-28 DIAGNOSIS — Z8673 Personal history of transient ischemic attack (TIA), and cerebral infarction without residual deficits: Secondary | ICD-10-CM | POA: Insufficient documentation

## 2020-04-28 DIAGNOSIS — Z8249 Family history of ischemic heart disease and other diseases of the circulatory system: Secondary | ICD-10-CM | POA: Insufficient documentation

## 2020-04-28 DIAGNOSIS — I482 Chronic atrial fibrillation, unspecified: Secondary | ICD-10-CM

## 2020-04-28 DIAGNOSIS — Z79899 Other long term (current) drug therapy: Secondary | ICD-10-CM | POA: Insufficient documentation

## 2020-04-28 DIAGNOSIS — F1010 Alcohol abuse, uncomplicated: Secondary | ICD-10-CM

## 2020-04-28 DIAGNOSIS — I10 Essential (primary) hypertension: Secondary | ICD-10-CM

## 2020-04-28 DIAGNOSIS — Z7982 Long term (current) use of aspirin: Secondary | ICD-10-CM | POA: Insufficient documentation

## 2020-04-28 DIAGNOSIS — F329 Major depressive disorder, single episode, unspecified: Secondary | ICD-10-CM | POA: Diagnosis not present

## 2020-04-28 DIAGNOSIS — F419 Anxiety disorder, unspecified: Secondary | ICD-10-CM | POA: Insufficient documentation

## 2020-04-28 DIAGNOSIS — Z7901 Long term (current) use of anticoagulants: Secondary | ICD-10-CM | POA: Insufficient documentation

## 2020-04-28 NOTE — Progress Notes (Signed)
[facility] - PHARMACIST COUNSELING NOTE  ADHERENCE ASSESSMENT  Adherence strategy: Patient endorses taking medications prior to visit today. He did not bring medication bottles to visit. Patient takes medications from the pill bottle.    Do you ever forget to take your medication? [x] Yes (1) [] No (0)  Do you ever skip doses due to side effects? [] Yes (1) [x] No (0)  Do you have trouble affording your medicines? [x] Yes (1) [] No (0)  Are you ever unable to pick up your medication due to transportation difficulties? [] Yes (1) [x] No (0)  Do you ever stop taking your medications because you don't believe they are helping? [] Yes (1) [x] No (0)  Total score 2   Recommendations given to patient about increasing adherence: Patient reports forgetting to take medications 2-3x/week. Recommended patient set an alarm on his phone and obtain a pillbox. He also reports that he is not taking apixaban due to cost.  Guideline-Directed Medical Therapy/Evidence Based Medicine  ACE/ARB/ARNI: Lisinopril 20 mg daily Beta Blocker: Metoprolol tartrate 75 mg BID Aldosterone Antagonist: None Diuretic: Furosemide 40 mg BID    SUBJECTIVE  HPI: Patient is a 54 y/o M with PMH as below who presents to CHF clinic for new patient visit. Patient was recently hospitalized 5/18 - 5/22 with hyponatremia secondary to fluid overload, hypertensive emergency, and COPD exacerbation.  Past Medical History:  Diagnosis Date  . A-fib ([place])   . Anxiety   . CHF (congestive heart failure) ([place])   . Depression   . Hypersomnia with sleep apnea   . Hypertension   . Morbid obesity ([place])   . NSTEMI (non-ST elevated myocardial infarction) ([place]) 2005  . Obesity hypoventilation syndrome ([place])   . Shortness of breath dyspnea   . Sleep apnea   . Stroke ([place])   . V-tach [facility])       OBJECTIVE   Vital signs: HR 96, BP 150/108 (122/82 on re-check), weight (pounds) 253 ECHO: Date  04/20/20, EF 50-55%, notes: Grade I diastolic dysfunction. LA + RA mildly dilated. Cath: Date 09/10/18, EF 60%, notes: normal coronaries  BMP Latest Ref Rng & Units 04/24/2020 04/23/2020 04/22/2020  Glucose 70 - 99 mg/dL 87 115(H) 143(H)  BUN 6 - 20 mg/dL 15 13 13   Creatinine 0.61 - 1.24 mg/dL 0.75 0.72 0.88  Sodium 135 - 145 mmol/L 130(L) 129(L) 127(L)  Potassium 3.5 - 5.1 mmol/L 4.0 3.7 4.5  Chloride 98 - 111 mmol/L 81(L) 81(L) 83(L)  CO2 22 - 32 mmol/L 39(H) 37(H) 33(H)  Calcium 8.9 - 10.3 mg/dL 9.0 9.3 9.1    ASSESSMENT  Patient is well appearing and in no acute distress. He denies shortness of breath but endorses lower extremity edema. He endorses forgetting to take his medications 2-3 x / week due to forgetfulness. Advised patient to set an alarm on his phone and obtain a pillbox. He has had trouble affording apixaban.   PLAN  1). Diastolic CHF -Fluid management with furosemide 40 mg BID -Counseled patient on daily weights and call parameters. He will be provided with a scale -Counseled patient on low sodium diet (<2 g sodium/day) -Discussed with provider - no changes to medication regimen today. Patient scheduled for appointment with cardiology and PCP.  2). Hypertension -Hypertensive initially, but normotensive on re-check -Antihypertensive regimen includes lisinopril 20 mg daily, metoprolol tartrate 75 mg BID, hydralazine 100 mg TID, furosemide 40 mg BID -He reports checking his blood pressure at home and that it runs high. Advised patient to  maintain a log  -Denies signs/symptoms of hypotension  3). Atrial fibrillation -HR 96 today - likely elevated secondary to long walk to clinic -Rhythm control with amiodarone 200 mg daily -Rate control with metoprolol tartrate 75 mg BID -Anticoagulation with apixaban 5 mg BID (not taking) -Spoke with patient's pharmacy about apixaban. They are requiring further insurance information. Patient instructed to follow-up with Education officer, [facility].  Provided with 4-month coupon card  4). COPD -O2 saturation 91% -He is not wearing any supplemental oxygen at today's visit -He is not on any bronchodilators -Continues to smoke  5). Hx of stroke -Aspirin 81 mg daily -He is not on a statin ; 04/21/20 lipid panel with HDL 82, LDL 100, TG 47 -Non-compliant with apixaban secondary to cost  6). Tobacco use -Smoking 4-5 cigarettes per day -Reports he was unable to afford nicotine patches -Made patient aware of [place] Quitline resource  7). Alcohol use -Patient reports drinking four to five 24 oz. beers every other day -Likely contributory to fluid overload / hyponatremia on recent hospitalization  -Counseled patient on importance of decreasing alcohol intake  8). Depression -Fluoxetine 20 mg daily   Time spent: 15 minutes  [place] Resident 04/28/2020 8:41 AM    Current Outpatient Medications:  .  amiodarone (PACERONE) 200 MG tablet, Take 200 mg by mouth daily., Disp: , Rfl:  .  apixaban (ELIQUIS) 5 MG TABS tablet, Take 1 tablet (5 mg total) by mouth 2 (two) times daily., Disp: 60 tablet, Rfl: 0 .  FLUoxetine (PROZAC) 20 MG capsule, Take 20 mg by mouth daily., Disp: , Rfl:  .  furosemide (LASIX) 40 MG tablet, Take 1 tablet (40 mg total) by mouth 2 (two) times daily., Disp: 60 tablet, Rfl: 11 .  hydrALAZINE (APRESOLINE) 100 MG tablet, Take 100 mg by mouth 3 (three) times daily., Disp: , Rfl:  .  lisinopril (ZESTRIL) 20 MG tablet, Take 20 mg by mouth daily., Disp: , Rfl:  .  metoprolol tartrate (LOPRESSOR) 50 MG tablet, Take 75 mg by mouth 2 (two) times daily., Disp: , Rfl:  .  nicotine (NICODERM CQ - DOSED IN MG/24 HR) 7 mg/24hr patch, Place 1 patch (7 mg total) onto the skin daily., Disp: 14 patch, Rfl: 0   COUNSELING POINTS/CLINICAL PEARLS  Metoprolol Succinate (Goal: 200 mg once daily) Warn patient to avoid activities requiring mental alertness or coordination until drug effects are realized, as drug may cause  dizziness. Tell patient planning major surgery with anesthesia to alert physician that drug is being used, as drug impairs ability of heart to respond to reflex adrenergic stimuli. Drug may cause diarrhea, fatigue, headache, or depression. Advise diabetic patient to carefully monitor blood glucose as drug may mask symptoms of hypoglycemia. Patient should take extended-release tablet with or immediately following meals. Counsel patient against sudden discontinuation of drug, as this may precipitate hypertension, angina, or myocardial infarction. In the event of a missed dose, counsel patient to skip the missed dose and maintain a regular dosing schedule. Lisinopril (Goal: 20 to 40 mg once daily)  This drug may cause nausea, vomiting, dizziness, headache, or angioedema of face, lips, throat, or intestines.  Instruct patient to report signs/symptoms of hypotension, or a persistent cough.  Advise patient against sudden discontinuation of drug. Furosemide  Drug causes sun-sensitivity. Advise patient to use sunscreen and avoid tanning beds. Patient should avoid activities requiring coordination until drug effects are realized, as drug may cause dizziness, vertigo, or blurred vision. This drug may cause hyperglycemia,  hyperuricemia, constipation, diarrhea, loss of appetite, nausea, vomiting, purpuric disorder, cramps, spasticity, asthenia, headache, paresthesia, or scaling eczema. Instruct patient to report unusual bleeding/bruising or signs/symptoms of hypotension, infection, pancreatitis, or ototoxicity (tinnitus, hearing impairment). Advise patient to report signs/symptoms of a severe skin reactions (flu-like symptoms, spreading red rash, or skin/mucous membrane blistering) or erythema multiforme. Instruct patient to eat high-potassium foods during drug therapy, as directed by healthcare professional.  Patient should not drink alcohol while taking this drug.  DRUGS TO AVOID IN HEART FAILURE  Drug or  Class Mechanism  Analgesics . NSAIDs . COX-2 inhibitors . Glucocorticoids  Sodium and water retention, increased systemic vascular resistance, decreased response to diuretics   Diabetes Medications . Metformin . Thiazolidinediones o Rosiglitazone ([place]andia) o Pioglitazone (Actos) . DPP4 Inhibitors o Saxagliptin (Onglyza) o Sitagliptin (Januvia)   Lactic acidosis Possible calcium channel blockade   Unknown  Antiarrhythmics . Class I  o Flecainide o Disopyramide . Class III o Sotalol . Other o Dronedarone  Negative inotrope, proarrhythmic   Proarrhythmic, beta blockade  Negative inotrope  Antihypertensives . Alpha Blockers o Doxazosin . Calcium Channel Blockers o Diltiazem o Verapamil o Nifedipine . Central Alpha Adrenergics o Moxonidine . Peripheral Vasodilators o Minoxidil  Increases renin and aldosterone  Negative inotrope    Possible sympathetic withdrawal  Unknown  Anti-infective . Itraconazole . Amphotericin B  Negative inotrope Unknown  Hematologic . Anagrelide . Cilostazol   Possible inhibition of PD IV Inhibition of PD III causing arrhythmias  Neurologic/Psychiatric . Stimulants . Anti-Seizure Drugs o Carbamazepine o Pregabalin . Antidepressants o Tricyclics o Citalopram . Parkinsons o Bromocriptine o Pergolide o Pramipexole . Antipsychotics o Clozapine . Antimigraine o Ergotamine o Methysergide . Appetite suppressants . Bipolar o Lithium  Peripheral alpha and beta agonist activity  Negative inotrope and chronotrope Calcium channel blockade  Negative inotrope, proarrhythmic Dose-dependent QT prolongation  Excessive serotonin activity/valvular damage Excessive serotonin activity/valvular damage Unknown  IgE mediated hypersensitivy, calcium channel blockade  Excessive serotonin activity/valvular damage Excessive serotonin activity/valvular damage Valvular damage  Direct myofibrillar degeneration, adrenergic  stimulation  Antimalarials . Chloroquine . Hydroxychloroquine Intracellular inhibition of lysosomal enzymes  Urologic Agents . Alpha Blockers o Doxazosin o Prazosin o Tamsulosin o Terazosin  Increased renin and aldosterone  Adapted from Page RL, et al. "Drugs That May Cause or Exacerbate Heart Failure: A Scientific Statement from the [place]." Circulation 2016; 425:Z56-L87. DOI: 10.1161/CIR.0000000000000426   MEDICATION ADHERENCES TIPS AND STRATEGIES 1. Taking medication as prescribed improves patient outcomes in heart failure (reduces hospitalizations, improves symptoms, increases survival) 2. Side effects of medications can be managed by decreasing doses, switching agents, stopping drugs, or adding additional therapy. Please let someone in the [facility] know if you have having bothersome side effects so we can modify your regimen. Do not alter your medication regimen without talking to [place].  3. Medication reminders can help patients remember to take drugs on time. If you are missing or forgetting doses you can try linking behaviors, using pill boxes, or an electronic reminder like an alarm on your phone or an app. Some people can also get automated phone calls as medication reminders.

## 2020-04-28 NOTE — Patient Instructions (Addendum)
Continue weighing daily and call for an overnight weight gain of > 2 pounds or a weekly weight gain of >5 pounds.   [facility] June 1st   [facility]                            [address], [place], [place] [address]      Dr. Tasia Catchings at 1:15pm      Dr. Welton Flakes at 2:00pm

## 2020-05-12 ENCOUNTER — Emergency Department
Admission: EM | Admit: 2020-05-12 | Discharge: 2020-05-12 | Disposition: A | Discharge status: Home / Self Care | Payer: Medicare Other | Attending: Emergency Medicine | Admitting: Emergency Medicine

## 2020-05-12 ENCOUNTER — Emergency Department: Payer: Medicare Other

## 2020-05-12 ENCOUNTER — Encounter: Payer: Self-pay | Admitting: Emergency Medicine

## 2020-05-12 ENCOUNTER — Other Ambulatory Visit: Payer: Self-pay

## 2020-05-12 DIAGNOSIS — J449 Chronic obstructive pulmonary disease, unspecified: Secondary | ICD-10-CM | POA: Insufficient documentation

## 2020-05-12 DIAGNOSIS — Z79899 Other long term (current) drug therapy: Secondary | ICD-10-CM | POA: Insufficient documentation

## 2020-05-12 DIAGNOSIS — Z7982 Long term (current) use of aspirin: Secondary | ICD-10-CM | POA: Diagnosis not present

## 2020-05-12 DIAGNOSIS — R0789 Other chest pain: Secondary | ICD-10-CM | POA: Insufficient documentation

## 2020-05-12 DIAGNOSIS — I5032 Chronic diastolic (congestive) heart failure: Secondary | ICD-10-CM | POA: Diagnosis not present

## 2020-05-12 DIAGNOSIS — M79601 Pain in right arm: Secondary | ICD-10-CM | POA: Insufficient documentation

## 2020-05-12 DIAGNOSIS — F17210 Nicotine dependence, cigarettes, uncomplicated: Secondary | ICD-10-CM | POA: Insufficient documentation

## 2020-05-12 DIAGNOSIS — R079 Chest pain, unspecified: Secondary | ICD-10-CM

## 2020-05-12 DIAGNOSIS — I110 Hypertensive heart disease with heart failure: Secondary | ICD-10-CM | POA: Insufficient documentation

## 2020-05-12 DIAGNOSIS — M79606 Pain in leg, unspecified: Secondary | ICD-10-CM

## 2020-05-12 LAB — BASIC METABOLIC PANEL
Anion gap: 16 — ABNORMAL HIGH (ref 5–15)
BUN: 6 mg/dL (ref 6–20)
CO2: 30 mmol/L (ref 22–32)
Calcium: 8.9 mg/dL (ref 8.9–10.3)
Chloride: 90 mmol/L — ABNORMAL LOW (ref 98–111)
Creatinine, Ser: 0.83 mg/dL (ref 0.61–1.24)
GFR calc Af Amer: >60 mL/min (ref >60)
GFR calc non Af Amer: >60 mL/min (ref >60)
Glucose, Bld: 93 mg/dL (ref 70–99)
Potassium: 3.4 mmol/L — ABNORMAL LOW (ref 3.5–5.1)
Sodium: 136 mmol/L (ref 135–145)

## 2020-05-12 LAB — TROPONIN I (HIGH SENSITIVITY)
Troponin I (High Sensitivity): 23 ng/L — ABNORMAL HIGH (ref <18)
Troponin I (High Sensitivity): 22 ng/L — ABNORMAL HIGH (ref <18)

## 2020-05-12 LAB — CK: Total CK: 82 U/L (ref 49–397)

## 2020-05-12 LAB — BRAIN NATRIURETIC PEPTIDE: B Natriuretic Peptide: 390.4 pg/mL — ABNORMAL HIGH (ref 0.0–100.0)

## 2020-05-12 LAB — CBC
HCT: 44.2 % (ref 39.0–52.0)
Hemoglobin: 15.3 g/dL (ref 13.0–17.0)
MCH: 32 pg (ref 26.0–34.0)
MCHC: 34.6 g/dL (ref 30.0–36.0)
MCV: 92.5 fL (ref 80.0–100.0)
Platelets: 245 10*3/uL (ref 150–400)
RBC: 4.78 MIL/uL (ref 4.22–5.81)
RDW: 13.8 % (ref 11.5–15.5)
WBC: 2.9 10*3/uL — ABNORMAL LOW (ref 4.0–10.5)
nRBC: 0 % (ref 0.0–0.2)

## 2020-05-12 MED ORDER — IOHEXOL 350 MG/ML SOLN
75.0000 mL | Freq: Once | INTRAVENOUS | Status: AC | PRN
  Administered 2020-05-12: 75 mL via INTRAVENOUS

## 2020-05-12 MED ORDER — LISINOPRIL 10 MG PO TABS
20.0000 mg | ORAL_TABLET | Freq: Once | ORAL | Status: AC
  Administered 2020-05-12: 20 mg via ORAL
  Filled 2020-05-12: qty 2

## 2020-05-12 MED ORDER — METOPROLOL TARTRATE 50 MG PO TABS
75.0000 mg | ORAL_TABLET | Freq: Once | ORAL | Status: AC
  Administered 2020-05-12: 75 mg via ORAL
  Filled 2020-05-12: qty 1

## 2020-05-12 MED ORDER — HYDROCODONE-ACETAMINOPHEN 5-325 MG PO TABS
1.0000 | ORAL_TABLET | Freq: Once | ORAL | Status: AC
  Administered 2020-05-12: 1 via ORAL
  Filled 2020-05-12: qty 1

## 2020-05-12 MED ORDER — ONDANSETRON HCL 4 MG/2ML IJ SOLN
4.0000 mg | Freq: Once | INTRAMUSCULAR | Status: AC
  Administered 2020-05-12: 4 mg via INTRAVENOUS
  Filled 2020-05-12: qty 2

## 2020-05-12 MED ORDER — MORPHINE SULFATE (PF) 4 MG/ML IV SOLN
4.0000 mg | Freq: Once | INTRAVENOUS | Status: AC
  Administered 2020-05-12: 4 mg via INTRAVENOUS
  Filled 2020-05-12: qty 1

## 2020-05-12 MED ORDER — HYDROCODONE-ACETAMINOPHEN 5-325 MG PO TABS: 1.0000 | ORAL_TABLET | Freq: Four times a day (QID) | ORAL | 0 refills | Status: DC | PRN

## 2020-05-12 NOTE — ED Notes (Addendum)
Patient given warm blanket and ice water per Dr. Darnelle Catalan. NAD.

## 2020-05-12 NOTE — ED Notes (Signed)
Per CT pt PIV will not flush easily enough to take for CTA. Pt PIV pulls back blood with ease and flushes without pain but will not fully insert to hub.

## 2020-05-12 NOTE — ED Notes (Signed)
IV team at bedside 

## 2020-05-12 NOTE — Discharge Instructions (Signed)
I am not sure what is causing the pain in your arm in your legs and your chest.  I do want you to follow-up with your regular doctor to keep an eye on them and also Dr. Alinda Sierras.  The chest CT that we did is suspicious for pulmonary hypertension which is something that Dr. Alinda Sierras will be able to work on.  Additionally he can investigate your leg pain.  It may be that you will need an MRI of your back to evaluate the leg pain more fully.  I will leave that up to your regular doctors.  Please return here for worse pain or any weakness in your legs or incontinence .

## 2020-05-12 NOTE — ED Notes (Signed)
CT at bedside 

## 2020-05-12 NOTE — ED Provider Notes (Signed)
[facility] Emergency Department Provider Note   ____________________________________________   First MD Initiated Contact with Patient 05/12/20 1033     (approximate)  I have reviewed the triage vital signs and the nursing notes.   HISTORY  Chief Complaint Chest Pain    HPI Andrew Sparks is a 54 y.o. male who reports chest pain and shortness of breath intermittently for about 3 days.  Nothing seems to make it better or worse.  Does not seem to be worse with deep breathing or exercise.  Patient also has pain in his arms and legs right arm is worse than left.  Both legs from hip down ache that is worse when he walks better after he rests.  Also been going on for 3 days that is the arm and leg pain         Past Medical History:  Diagnosis Date  . A-fib (HCC)   . Anxiety   . CHF (congestive heart failure) (HCC)   . Depression   . Hypersomnia with sleep apnea   . Hypertension   . Morbid obesity (HCC)   . NSTEMI (non-ST elevated myocardial infarction) (HCC) 2005  . Obesity hypoventilation syndrome (HCC)   . Shortness of breath dyspnea   . Sleep apnea   . Stroke (HCC)   . V-tach [facility])     Patient Active Problem List   Diagnosis Date Noted  . Hyponatremia 04/20/2020  . Accelerated hypertension   . Acute on chronic diastolic CHF (congestive heart failure) (HCC)   . Atrial fibrillation, chronic (HCC)   . COPD with acute exacerbation (HCC)   . Depression with anxiety   . Alcohol abuse   . Tobacco abuse   . Tobacco abuse counseling   . Ischemic stroke (HCC) 04/20/2019  . ARF (acute renal failure) (HCC) 03/26/2018  . CHF (congestive heart failure) (HCC) 07/17/2017  . 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 Panic disorder 01/18/2017  . Chest pain 01/17/2017  . Obstructive sleep apnea syndrome 02/01/2016  . Tracheostomy care (HCC)   . 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 Debility 12/16/2014  . Obesity hypoventilation syndrome (HCC) 12/16/2014  . Encephalopathy pulmonary 12/15/2014    Past Surgical History:  Procedure Laterality Date  . LEFT HEART CATH AND CORONARY ANGIOGRAPHY Right 09/10/2018   Procedure: LEFT HEART CATH AND CORONARY ANGIOGRAPHY with possible percutaneous intervention;  Surgeon: Laurier Nancy, MD;  Location: [facility] INVASIVE CV LAB;  Service: Cardiovascular;  Laterality: Right;  . TRACHEOSTOMY      Prior to Admission medications   Medication Sig Start Date End Date Taking? Authorizing Provider  amiodarone (PACERONE) 200 MG tablet Take 200 mg by mouth daily.    [provider]  apixaban (ELIQUIS) 5 MG TABS tablet Take 1 tablet (5 mg total) by mouth 2 (two) times daily. Patient not taking: Reported on 04/28/2020 04/24/20   Marrion Coy, MD  aspirin EC 81 MG tablet Take 81 mg by mouth daily.    [provider]  FLUoxetine (PROZAC) 20 MG capsule Take 20 mg by mouth daily.    [provider]  furosemide (LASIX) 40 MG tablet Take 1 tablet (40 mg total) by mouth 2 (two) times daily. 04/24/20 04/24/21  Marrion Coy, MD  hydrALAZINE (APRESOLINE) 100 MG tablet Take 100 mg by mouth 3 (three) times daily.    [provider]  HYDROcodone-acetaminophen (NORCO/VICODIN) 5-325 MG tablet Take 1 tablet by mouth  every 6 (six) hours as needed for moderate pain. 05/12/20   Arnaldo Natal, MD  lisinopril (ZESTRIL) 20 MG tablet Take 20 mg by mouth daily.    [provider]  metoprolol tartrate (LOPRESSOR) 50 MG tablet Take 75 mg by mouth 2 (two) times daily.    [provider]    Allergies Patient has no known allergies.  Family History  Problem Relation Age of Onset  . Heart disease Father   . Diabetes type II Mother   . Breast cancer Mother     Social History Social History   Tobacco Use  . Smoking status: Current Every Day Smoker    Packs/day: 0.25    Years: 28.00    Pack years: 7.00    Types:  Cigarettes  . Smokeless tobacco: Never Used  Substance Use Topics  . Alcohol use: Yes    Alcohol/week: 4.0 standard drinks    Types: 4 Cans of beer per week  . Drug use: No    Review of Systems  Constitutional: No fever/chills Eyes: No visual changes. ENT: No sore throat. Cardiovascular:  chest pain. Respiratory:  shortness of breath. Gastrointestinal: No abdominal pain.  No nausea, no vomiting.  No diarrhea.  No constipation. Genitourinary: Negative for dysuria. Musculoskeletal: Negative for back pain. Skin: Negative for rash. Neurological: Negative for headaches, focal weakness  ____________________________________________   PHYSICAL EXAM:  VITAL SIGNS: ED Triage Vitals  Enc Vitals Group     BP 05/12/20 0755 (!) 156/107     Pulse Rate 05/12/20 0755 88     Resp 05/12/20 0755 (!) 24     Temp 05/12/20 0755 98.8 F (37.1 C)     Temp Source 05/12/20 0755 Oral     SpO2 05/12/20 0755 98 %     Weight 05/12/20 0753 253 lb 2 oz (114.8 kg)     Height 05/12/20 0753 5\' 11"  (1.803 m)     Head Circumference --      Peak Flow --      Pain Score 05/12/20 0753 8     Pain Loc --      Pain Edu? --      Excl. in GC? --     Constitutional: Alert and oriented. Well appearing and in no acute distress. Eyes: Conjunctivae are normal.  Head: Atraumatic. Nose: No congestion/rhinnorhea. Mouth/Throat: Mucous membranes are moist.  Oropharynx non-erythematous. Neck: No stridor. Cardiovascular: Normal rate, regular rhythm. Grossly normal heart sounds.  Good peripheral circulation. Respiratory: Normal respiratory effort.  No retractions. Lungs CTAB. Gastrointestinal: Soft and nontender. No distention. No abdominal bruits. No CVA tenderness. Musculoskeletal: No lower extremity tenderness nor edema.  No joint effusions. Neurologic:  Normal speech and language. No gross focal neurologic deficits are appreciated. No gait instability. Skin:  Skin is warm, dry and intact. No rash  noted. Psychiatric: Mood and affect are normal. Speech and behavior are normal.  ____________________________________________   LABS (all labs ordered are listed, but only abnormal results are displayed)  Labs Reviewed  BASIC METABOLIC PANEL - Abnormal; Notable for the following components:      Result Value   Potassium 3.4 (*)    Chloride 90 (*)    Anion gap 16 (*)    All other components within normal limits  CBC - Abnormal; Notable for the following components:   WBC 2.9 (*)    All other components within normal limits  BRAIN NATRIURETIC PEPTIDE - Abnormal; Notable for the following components:   B Natriuretic Peptide  390.4 (*)    All other components within normal limits  TROPONIN I (HIGH SENSITIVITY) - Abnormal; Notable for the following components:   Troponin I (High Sensitivity) 23 (*)    All other components within normal limits  TROPONIN I (HIGH SENSITIVITY) - Abnormal; Notable for the following components:   Troponin I (High Sensitivity) 22 (*)    All other components within normal limits  CK   ____________________________________________  EKG EKG #1 read interpreted by me shows A. fib at a rate of 85 extreme right axis no acute ST-T changes  _EKG #2 read interpreted by me shows A. fib at 97 no acute ST-T changes very similar to EKG #1 same axis ___________________________________________  RADIOLOGY  ED MD interpretation: Chest x-ray and CT angiogram read by radiology as essentially negative except for mildly mild cardiomegaly on the chest x-ray and what appears to be pulmonary hypertension in the chest CT I reviewed the films.  Official radiology report(s): DG Chest 2 View  Result Date: 05/12/2020 CLINICAL DATA:  54 year old male with history of intermittent right-sided chest pain and shortness of breath for the past 3 days. EXAM: CHEST - 2 VIEW COMPARISON:  Chest x-ray 04/20/2020. FINDINGS: Lung volumes are normal. No consolidative airspace disease. No pleural  effusions. No pneumothorax. No suspicious appearing pulmonary nodules or masses are noted. No evidence of pulmonary edema. Mild cardiomegaly. Upper mediastinal contours are within normal limits. IMPRESSION: 1. No radiographic evidence of acute cardiopulmonary disease. 2. Mild cardiomegaly. Electronically Signed   By: Trudie Reed M.D.   On: 05/12/2020 08:40   CT Angio Chest PE W and/or Wo Contrast  Result Date: 05/12/2020 CLINICAL DATA:  Shortness of breath with right-sided chest, epigastric and right arm pain. Symptoms for 3 days. Clinical concern for pulmonary embolism. EXAM: CT ANGIOGRAPHY CHEST WITH CONTRAST TECHNIQUE: Multidetector CT imaging of the chest was performed using the standard protocol during bolus administration of intravenous contrast. Multiplanar CT image reconstructions and MIPs were obtained to evaluate the vascular anatomy. CONTRAST:  56mL OMNIPAQUE IOHEXOL 350 MG/ML SOLN COMPARISON:  Radiographs 05/12/2020 and 04/20/2020. Abdominal CT 03/26/2018. FINDINGS: Cardiovascular: The pulmonary arteries are well opacified with contrast to the level of the subsegmental branches. There is no evidence of acute pulmonary embolism. There is central enlargement of the pulmonary arteries consistent with pulmonary arterial hypertension. There is limited opacification of the systemic arteries. No acute vascular findings are seen. There is atherosclerosis of the aorta, great vessels and coronary arteries. The heart is moderately enlarged. No significant pericardial fluid. Mediastinum/Nodes: There are no enlarged mediastinal, hilar or axillary lymph nodes.There are small mediastinal lymph nodes. The thyroid gland, trachea and esophagus demonstrate no significant findings. Lungs/Pleura: Trace right pleural effusion. No pneumothorax. Stable 5 mm right basilar perifissural nodule on image 52/6, consistent with a benign finding. No suspicious nodule or confluent airspace opacity. Upper abdomen: Mild reflux of  contrast into the IVC and hepatic veins. Low-density in the liver consistent with steatosis. Musculoskeletal/Chest wall: There is no chest wall mass or suspicious osseous finding. Stable subcutaneous lesion in the midline lower back consistent with a sebaceous cyst (image 86/4). Review of the MIP images confirms the above findings. IMPRESSION: 1. No evidence of acute pulmonary embolism or other acute chest process. 2. Central enlargement of the pulmonary arteries consistent with pulmonary arterial hypertension. 3. Moderate cardiomegaly with mild reflux of contrast into the IVC and hepatic veins. Trace right pleural effusion. 4. Aortic Atherosclerosis (ICD10-I70.0). Electronically Signed   By: Hilarie Fredrickson.D.  On: 05/12/2020 13:40    ____________________________________________   PROCEDURES  Procedure(s) performed (including Critical Care):  Procedures   ____________________________________________   INITIAL IMPRESSION / ASSESSMENT AND PLAN / ED COURSE  Discussed patient with Dr. Nehemiah Massed cardiology and with Dr. [facility] medical care.  Patient has seen Dr. Neoma Laming cardiology before.  We will have him follow-up with Dr. Dan Humphreys for the apparent pulmonary hypertension on CT and for his leg pain.  His CK is normal.  CT angio of the chest to rule out PE showed no aneurysm and no PE.  Patient's troponins are negative.  His EKGs show no obvious acute changes.  The patient's BNP is elevated but there is nothing on chest x-ray or CT to suggest CHF.  It may be elevated due to his apparent pulmonary hypertension that showed up on CT.  I cannot get the Doppler with waveforms of the arteries in the legs as I had wanted to and as [facility] have asked for but I did get a call ankle-brachial indexes bilaterally both of these are over 0.9 and are within normal limits.  It is possible that the patient is having some spinal stenosis or other cause of his leg pain he does have a little bit of back pain  that goes along with the leg pain.  He is not having any weakness in his legs.  I will let the patient go with the follow-up I described he will return if he has any further problems or gets worse.  I will give him a little bit of hydrocodone as needed for pain.  I should add that the initial 2 times that I tried to check the PDMP it would not work but after I signed the prescription I went back and checked the PDMP again and it did work.  I should also add that [facility] told me that the patient is apparently noncompliant with his medications and frequently comes in with very high blood pressure.              ____________________________________________   FINAL CLINICAL IMPRESSION(S) / ED DIAGNOSES  Final diagnoses:  Lower extremity pain, diffuse, unspecified laterality  Chest pain, unspecified type     ED Discharge Orders         Ordered    HYDROcodone-acetaminophen (NORCO/VICODIN) 5-325 MG tablet  Every 6 hours PRN     05/12/20 1710           Note:  This document was prepared using Dragon voice recognition software and may include unintentional dictation errors.    Nena Polio, MD 05/12/20 [phone]

## 2020-05-12 NOTE — ED Notes (Signed)
Patient found sitting up in a chair at bedside. Patient instructed to lie on bed for blood pressure readings.

## 2020-05-12 NOTE — ED Triage Notes (Signed)
Here for right sided intermittent CP with SHOB for 3 days.  No radiation of pain, nausea or diaphoresis.  4 L [place] at baseline.  No fever or cough.

## 2020-05-12 NOTE — ED Notes (Signed)
Two unsuccessful attempts by Naoma Diener, Student RN and one attempt by Cherlynn Perches RN. IV team has been consulted.

## 2020-05-18 NOTE — Progress Notes (Deleted)
Patient ID: Andrew Sparks, male    DOB: February 14, 1966, 54 y.o.   MRN: [id number]  HPI  Andrew Sparks is a 54 y/o male with a history of HTN, stroke, anxiety, obesity hypoventilation syndrome, sleep apnea, depression, NSTEMI, VT, atrial fibrillation, previous trach, current tobacco / alcohol use and chronic heart failure.   Echo report from 04/20/20 reviewed and showed an EF of 50-55% along with mild LAE.  Catheterization done 09/10/18 and showed: Normal coronaries, RCA had anterior take off and was non selective and normal LVEF.  Was in the ED 05/12/20 due to shortness of breath and arm/le pain. Chest CT negative for PE or aneurysm. Released with cardiology f/u. Admitted 04/20/20 due to acute on chronic HF along with hyponatremia. Given IV lasix along with tolvaptan. Initial sodium level was 113. Head CT negative for stroke. Discharged after 4 days.   He presents today for a follow-up visit with a chief complaint of    Past Medical History:  Diagnosis Date   A-fib ([place])    Anxiety    CHF (congestive heart failure) ([place])    Depression    Hypersomnia with sleep apnea    Hypertension    Morbid obesity ([place])    NSTEMI (non-ST elevated myocardial infarction) ([place]) 2005   Obesity hypoventilation syndrome (HCC)    Shortness of breath dyspnea    Sleep apnea    Stroke ([place])    V-tach [facility])    Past Surgical History:  Procedure Laterality Date   LEFT HEART CATH AND CORONARY ANGIOGRAPHY Right 09/10/2018   Procedure: LEFT HEART CATH AND CORONARY ANGIOGRAPHY with possible percutaneous intervention;  Surgeon: Dionisio David, MD;  Location: [facility] CV LAB;  Service: Cardiovascular;  Laterality: Right;   TRACHEOSTOMY     Family History  Problem Relation Age of Onset   Heart disease Father    Diabetes type II Mother    Breast cancer Mother    Social History   Tobacco Use   Smoking status: Current Every Day Smoker    Packs/day: 0.25    Years: 28.00    Pack years:  7.00    Types: Cigarettes   Smokeless tobacco: Never Used  Substance Use Topics   Alcohol use: Yes    Alcohol/week: 4.0 standard drinks    Types: 4 Cans of beer per week   No Known Allergies    Review of Systems  Constitutional: Positive for fatigue. Negative for appetite change.  HENT: Positive for congestion. Negative for postnasal drip and sore throat.   Eyes: Negative.   Respiratory: Positive for shortness of breath (with moderate exertion). Negative for cough and wheezing.   Cardiovascular: Positive for leg swelling. Negative for chest pain and palpitations.  Gastrointestinal: Negative for abdominal distention and abdominal pain.  Endocrine: Negative.   Genitourinary: Negative.   Musculoskeletal: Positive for arthralgias (leg pain). Negative for back pain.  Skin: Negative.   Allergic/Immunologic: Negative.   Neurological: Negative for dizziness and light-headedness.  Hematological: Negative for adenopathy. Does not bruise/bleed easily.  Psychiatric/Behavioral: Positive for sleep disturbance (sleeping on 1 pillow). Negative for dysphoric mood. The patient is not nervous/anxious.      Physical Exam Vitals and nursing note reviewed.  Constitutional:      Appearance: Normal appearance.  HENT:     Head: Normocephalic and atraumatic.  Cardiovascular:     Rate and Rhythm: Normal rate and regular rhythm.  Pulmonary:     Effort: Pulmonary effort is normal. No respiratory distress.  Breath sounds: No wheezing or rales.  Abdominal:     General: There is no distension.     Palpations: Abdomen is soft.  Musculoskeletal:        General: No tenderness.     Cervical back: Normal range of motion and neck supple.     Right lower leg: Edema (2+ pitting) present.     Left lower leg: Edema (2+ pitting) present.  Skin:    General: Skin is warm and dry.  Neurological:     General: No focal deficit present.     Mental Status: He is alert and oriented to person, place, and time.   Psychiatric:        Mood and Affect: Mood normal.        Behavior: Behavior normal.        Thought Content: Thought content normal.    Assessment & Plan:  1: Chronic heart failure with preserved ejection fraction with mild structural changes- - NYHA class II - euvolemic today - weighing daily; reminded to call for an overnight weight gain of >2 pounds or a weekly weight gain of >5 pounds - not adding salt and says that he rinses his canned foods off 2-3 times before using; does try to use fresh vegetables when possible; reviewed the importance of reading food labels  - saw cardiology Welton Flakes) on 05/04/20 - BNP 05/12/20 was 390.4 - PharmD reconciled medications with the patient  2: HTN- - BP  - f/u appointment scheduled with PCP (Tejan-Sie) on 05/04/20 - BMP 05/12/20 reviewed and showed sodium 136, potassium 3.4, creatinine 0.83 and GFR >60  3: COPD- - continues to smoke 4-5 cigarettes daily; was unable to afford nicoderm patches - wears oxygen at 3L at bedtime (does not have CPAP)  4: Atrial fibrillation- - was unable to afford eliquis as he says his pharmacy says they are missing paperwork for 3rd party payor; advised patient to contact his medicaid social worker who should be able to help him - 30 day voucher for eliquis given to patient so that he can get started back on it - has already had previous stroke  5: Alcohol use- - continues to drink 4-5 twenty-ounce cans of beer every other day - cessation discussed for 3 minutes with him  6: Lymphedema- - stage 2 - quite active and does a lot of walking - encouraged him to elevate his legs when sitting for long periods of time as well as get compression socks to put on every morning with removal at bedtime - consider lymphapress compression boots if edema persists   Patient did not bring his medications nor a list. Each medication was verbally reviewed with the patient and he was encouraged to bring the bottles to every visit to  confirm accuracy of list.

## 2020-05-19 ENCOUNTER — Ambulatory Visit: Payer: Medicare Other | Admitting: Family

## 2020-06-14 ENCOUNTER — Ambulatory Visit: Payer: Medicare Other | Admitting: Family

## 2020-07-12 ENCOUNTER — Ambulatory Visit: Payer: Medicare Other | Attending: Internal Medicine

## 2020-07-12 DIAGNOSIS — Z23 Encounter for immunization: Secondary | ICD-10-CM

## 2020-07-12 NOTE — Progress Notes (Signed)
Patient ID: Fedor Kazmierski, male    DOB: 05/31/1966, 54 y.o.   MRN: [id number]  HPI  Mr Smoak is a 54 y/o male with a history of HTN, stroke, anxiety, obesity hypoventilation syndrome, sleep apnea, depression, NSTEMI, VT, atrial fibrillation, previous trach, current tobacco / alcohol use and chronic heart failure.   Echo report from 04/20/20 reviewed and showed an EF of 50-55% along with mild LAE.  Catheterization done 09/10/18 and showed: Normal coronaries, RCA had anterior take off and was non selective and normal LVEF.  Was in the ED 05/12/20 due to chest pain, shortness of breath and bilateral arm/leg pain. No PE or aneurysm. Troponins are negative. Released with f/u with his PCP. Admitted 04/20/20 due to acute on chronic HF along with hyponatremia. Given IV lasix along with tolvaptan. Initial sodium level was 113. Head CT negative for stroke. Discharged after 4 days.   He presents today for a follow-up visit with a chief complaint of minimal shortness of breath upon moderate exertion. He describes this as chronic in nature having been present for several years. He has associated fatigue, head congestion and difficulty sleeping along with this. He denies any dizziness, abdominal distention, palpitations, pedal edema, chest pain, wheezing, cough or weight gain.   Has lost his insurance and isn't able to sign up again until ~ November 2021 so is currently uninsured. Has been out of his eliquis due to cost. Was not able to see cardiology or PCP due to this. Has been seen at [facility] in the past.   Past Medical History:  Diagnosis Date  . A-fib (HCC)   . Anxiety   . CHF (congestive heart failure) (HCC)   . Depression   . Hypersomnia with sleep apnea   . Hypertension   . Morbid obesity (HCC)   . NSTEMI (non-ST elevated myocardial infarction) (HCC) 2005  . Obesity hypoventilation syndrome (HCC)   . Shortness of breath dyspnea   . Sleep apnea   . Stroke (HCC)   . V-tach [facility])     Past Surgical History:  Procedure Laterality Date  . LEFT HEART CATH AND CORONARY ANGIOGRAPHY Right 09/10/2018   Procedure: LEFT HEART CATH AND CORONARY ANGIOGRAPHY with possible percutaneous intervention;  Surgeon: Laurier Nancy, MD;  Location: [facility] INVASIVE CV LAB;  Service: Cardiovascular;  Laterality: Right;  . TRACHEOSTOMY     Family History  Problem Relation Age of Onset  . Heart disease Father   . Diabetes type II Mother   . Breast cancer Mother    Social History   Tobacco Use  . Smoking status: Current Every Day Smoker    Packs/day: 0.25    Years: 28.00    Pack years: 7.00    Types: Cigarettes  . Smokeless tobacco: Never Used  Substance Use Topics  . Alcohol use: Yes    Alcohol/week: 4.0 standard drinks    Types: 4 Cans of beer per week   No Known Allergies  Prior to Admission medications   Medication Sig Start Date End Date Taking? Authorizing Provider  amiodarone (PACERONE) 200 MG tablet Take 200 mg by mouth daily.   Yes [provider]  apixaban (ELIQUIS) 5 MG TABS tablet Take 1 tablet (5 mg total) by mouth 2 (two) times daily. 04/24/20  Yes Marrion Coy, MD  aspirin EC 81 MG tablet Take 81 mg by mouth daily.   Yes [provider]  FLUoxetine (PROZAC) 20 MG capsule Take 20 mg by mouth daily.  Yes [provider]  furosemide (LASIX) 40 MG tablet Take 1 tablet (40 mg total) by mouth 2 (two) times daily. 04/24/20 04/24/21 Yes Marrion Coy, MD  hydrALAZINE (APRESOLINE) 100 MG tablet Take 100 mg by mouth 3 (three) times daily.   Yes [provider]  HYDROcodone-acetaminophen (NORCO/VICODIN) 5-325 MG tablet Take 1 tablet by mouth every 6 (six) hours as needed for moderate pain. 05/12/20  Yes Arnaldo Natal, MD  lisinopril (ZESTRIL) 20 MG tablet Take 20 mg by mouth daily.   Yes [provider]  metoprolol tartrate (LOPRESSOR) 50 MG tablet Take 75 mg by mouth 2 (two) times daily.   Yes [provider]    Review of  Systems  Constitutional: Positive for fatigue. Negative for appetite change.  HENT: Positive for congestion. Negative for postnasal drip and sore throat.   Eyes: Negative.   Respiratory: Positive for shortness of breath (with moderate exertion). Negative for cough and wheezing.   Cardiovascular: Negative for chest pain, palpitations and leg swelling.  Gastrointestinal: Negative for abdominal distention and abdominal pain.  Endocrine: Negative.   Genitourinary: Negative.   Musculoskeletal: Positive for arthralgias (leg pain). Negative for back pain.  Skin: Negative.   Allergic/Immunologic: Negative.   Neurological: Negative for dizziness and light-headedness.  Hematological: Negative for adenopathy. Does not bruise/bleed easily.  Psychiatric/Behavioral: Positive for sleep disturbance (sleeping on 1 pillow). Negative for dysphoric mood. The patient is not nervous/anxious.    Vitals:   07/13/20 0833 07/13/20 0900  BP: (!) 167/122 (!) 142/98  Pulse: 99   Resp: 20   SpO2: 98%   Weight: 242 lb 2 oz (109.8 kg)   Height: 5\' 9"  (1.753 m)    Wt Readings from Last 3 Encounters:  07/13/20 242 lb 2 oz (109.8 kg)  05/12/20 253 lb 2 oz (114.8 kg)  04/28/20 253 lb 2 oz (114.8 kg)   Lab Results  Component Value Date   CREATININE 0.83 05/12/2020   CREATININE 0.75 04/24/2020   CREATININE 0.72 04/23/2020   Physical Exam Vitals and nursing note reviewed.  Constitutional:      Appearance: Normal appearance.  HENT:     Head: Normocephalic and atraumatic.  Cardiovascular:     Rate and Rhythm: Normal rate. Rhythm irregular.  Pulmonary:     Effort: Pulmonary effort is normal. No respiratory distress.     Breath sounds: No wheezing or rales.  Abdominal:     General: There is no distension.     Palpations: Abdomen is soft.  Musculoskeletal:        General: No tenderness.     Cervical back: Normal range of motion and neck supple.     Right lower leg: Edema (trace pitting) present.     Left  lower leg: Edema (trace pitting) present.  Skin:    General: Skin is warm and dry.  Neurological:     General: No focal deficit present.     Mental Status: He is alert and oriented to person, place, and time.  Psychiatric:        Mood and Affect: Mood normal.        Behavior: Behavior normal.        Thought Content: Thought content normal.    Assessment & Plan:  1: Chronic heart failure with preserved ejection fraction with mild structural changes- - NYHA class II - euvolemic today - weighing daily; reminded to call for an overnight weight gain of >2 pounds or a weekly weight gain of >5 pounds -  weight down 11 pounds from last visit here 3 months ago - not adding salt and says that he rinses his canned foods off 2-3 times before using; does try to use fresh vegetables when possible - was unable to see Dr. Welton Flakes in June because he lost his insurance - BNP 05/12/20 was 390.4 - reports receiving his first COVID vaccine yesterday  2: HTN- - BP initially elevated after walking here (167/122) but much better after checking it with a manual cuff (142/98) - was unable to see PCP (Tejan-Sie) on 05/04/20 due to losing his insurance - he says that he has been to [facility] in the past and that telephone number was put on his AVS for him to call and get re-established with them - BMP 05/12/20 reviewed and showed sodium 136, potassium 3.4, creatinine 0.83 and GFR >60  3: COPD- - continues to smoke 4-5 cigarettes daily - wears oxygen at 3L at bedtime (does not have CPAP)  4: Atrial fibrillation- - has been off eliquis again because he lost his insurance and is unable to afford it; 1 month samples given to him - [facility] brochure given to him and he says that he will stop over there after leaving here; secure message also sent to them - has already had previous stroke  5: Alcohol use- - drinks one twenty-ounce cans of beer every other day - cessation discussed for 3  minutes with him  6: Lymphedema- - stage 2 - quite active and does a lot of walking - encouraged him to elevate his legs when sitting for long periods of time as well as get compression socks to put on every morning with removal at bedtime - consider lymphapress compression boots if edema persists   Patient did not bring his medications nor a list. Each medication was verbally reviewed with the patient and he was encouraged to bring the bottles to every visit to confirm accuracy of list.  Return in 1 month or sooner for any questions/problems before then.

## 2020-07-12 NOTE — Progress Notes (Signed)
   Covid-19 Vaccination Clinic  Name:  Andrew Sparks    MRN: [id number] DOB: Oct 08, 1966  07/12/2020  [name] was observed post Covid-19 immunization for 15 minutes without incident. He was provided with Vaccine Information Sheet and instruction to access the V-Safe system.   [name] was instructed to call 911 with any severe reactions post vaccine: Marland Kitchen Difficulty breathing  . Swelling of face and throat  . A fast heartbeat  . A bad rash all over body  . Dizziness and weakness   Immunizations Administered    Name Date Dose VIS Date Route   Pfizer COVID-19 Vaccine 07/12/2020  4:38 PM 0.3 mL 01/28/2019 Intramuscular   Manufacturer: [employer], [employer]   Lot: [id number]   NDC: [address]-9

## 2020-07-13 ENCOUNTER — Ambulatory Visit: Payer: Medicare Other | Attending: Family | Admitting: Family

## 2020-07-13 ENCOUNTER — Other Ambulatory Visit: Payer: Self-pay

## 2020-07-13 ENCOUNTER — Encounter: Payer: Self-pay | Admitting: Family

## 2020-07-13 VITALS — BP 142/98 | HR 99 | Resp 20 | Ht 69.0 in | Wt 242.1 lb

## 2020-07-13 DIAGNOSIS — G4730 Sleep apnea, unspecified: Secondary | ICD-10-CM | POA: Diagnosis not present

## 2020-07-13 DIAGNOSIS — I5032 Chronic diastolic (congestive) heart failure: Secondary | ICD-10-CM | POA: Diagnosis not present

## 2020-07-13 DIAGNOSIS — I110 Hypertensive heart disease with heart failure: Secondary | ICD-10-CM | POA: Diagnosis not present

## 2020-07-13 DIAGNOSIS — Z7982 Long term (current) use of aspirin: Secondary | ICD-10-CM | POA: Diagnosis not present

## 2020-07-13 DIAGNOSIS — E871 Hypo-osmolality and hyponatremia: Secondary | ICD-10-CM | POA: Diagnosis not present

## 2020-07-13 DIAGNOSIS — I252 Old myocardial infarction: Secondary | ICD-10-CM | POA: Diagnosis not present

## 2020-07-13 DIAGNOSIS — Z8673 Personal history of transient ischemic attack (TIA), and cerebral infarction without residual deficits: Secondary | ICD-10-CM | POA: Diagnosis not present

## 2020-07-13 DIAGNOSIS — Z930 Tracheostomy status: Secondary | ICD-10-CM | POA: Insufficient documentation

## 2020-07-13 DIAGNOSIS — I4891 Unspecified atrial fibrillation: Secondary | ICD-10-CM | POA: Diagnosis not present

## 2020-07-13 DIAGNOSIS — F419 Anxiety disorder, unspecified: Secondary | ICD-10-CM | POA: Insufficient documentation

## 2020-07-13 DIAGNOSIS — F17210 Nicotine dependence, cigarettes, uncomplicated: Secondary | ICD-10-CM | POA: Diagnosis not present

## 2020-07-13 DIAGNOSIS — Z8249 Family history of ischemic heart disease and other diseases of the circulatory system: Secondary | ICD-10-CM | POA: Insufficient documentation

## 2020-07-13 DIAGNOSIS — I10 Essential (primary) hypertension: Secondary | ICD-10-CM

## 2020-07-13 DIAGNOSIS — I482 Chronic atrial fibrillation, unspecified: Secondary | ICD-10-CM

## 2020-07-13 DIAGNOSIS — J449 Chronic obstructive pulmonary disease, unspecified: Secondary | ICD-10-CM

## 2020-07-13 DIAGNOSIS — E662 Morbid (severe) obesity with alveolar hypoventilation: Secondary | ICD-10-CM | POA: Insufficient documentation

## 2020-07-13 DIAGNOSIS — Z79899 Other long term (current) drug therapy: Secondary | ICD-10-CM | POA: Insufficient documentation

## 2020-07-13 DIAGNOSIS — Z6835 Body mass index (BMI) 35.0-35.9, adult: Secondary | ICD-10-CM | POA: Diagnosis not present

## 2020-07-13 DIAGNOSIS — Z9981 Dependence on supplemental oxygen: Secondary | ICD-10-CM | POA: Insufficient documentation

## 2020-07-13 DIAGNOSIS — F1010 Alcohol abuse, uncomplicated: Secondary | ICD-10-CM

## 2020-07-13 DIAGNOSIS — T45516A Underdosing of anticoagulants, initial encounter: Secondary | ICD-10-CM | POA: Insufficient documentation

## 2020-07-13 DIAGNOSIS — Z91120 Patient's intentional underdosing of medication regimen due to financial hardship: Secondary | ICD-10-CM | POA: Insufficient documentation

## 2020-07-13 DIAGNOSIS — I890 Lymphedema, not elsewhere classified: Secondary | ICD-10-CM

## 2020-07-13 DIAGNOSIS — F329 Major depressive disorder, single episode, unspecified: Secondary | ICD-10-CM | POA: Diagnosis not present

## 2020-07-13 DIAGNOSIS — Z7289 Other problems related to lifestyle: Secondary | ICD-10-CM | POA: Insufficient documentation

## 2020-07-13 NOTE — Patient Instructions (Addendum)
Continue weighing daily and call for an overnight weight gain of > 2 pounds or a weekly weight gain of >5 pounds.   [facility]   [phone]  [address] [place] [address]

## 2020-07-20 ENCOUNTER — Telehealth: Payer: Self-pay | Admitting: Family

## 2020-07-20 NOTE — Telephone Encounter (Signed)
He has been approved for patient assistance with Eliquis from 07/19/2020 to 07/19/2021 through [employer].     Maddux Vanscyoc,NT

## 2020-08-02 ENCOUNTER — Ambulatory Visit: Payer: Medicare Other | Attending: Internal Medicine

## 2020-08-02 DIAGNOSIS — Z23 Encounter for immunization: Secondary | ICD-10-CM

## 2020-08-02 NOTE — Progress Notes (Signed)
   Covid-19 Vaccination Clinic  Name:  Furqan Gosselin    MRN: [id number] DOB: 04-Apr-1966  08/02/2020  Mr. Rueter was observed post Covid-19 immunization for 15 minutes without incident. He was provided with Vaccine Information Sheet and instruction to access the V-Safe system.   Mr. Hermiz was instructed to call 911 with any severe reactions post vaccine: Marland Kitchen Difficulty breathing  . Swelling of face and throat  . A fast heartbeat  . A bad rash all over body  . Dizziness and weakness   Immunizations Administered    Name Date Dose VIS Date Route   Pfizer COVID-19 Vaccine 08/02/2020  2:04 PM 0.3 mL 01/28/2019 Intramuscular   Manufacturer: [employer], [employer]   Lot: [id number]   NDC: [address]-8

## 2020-08-10 NOTE — Progress Notes (Deleted)
Patient ID: Andrew Sparks, male    DOB: 04-Sep-1966, 54 y.o.   MRN: [id number]  HPI  Mr Bamber is a 54 y/o male with a history of HTN, stroke, anxiety, obesity hypoventilation syndrome, sleep apnea, depression, NSTEMI, VT, atrial fibrillation, previous trach, current tobacco / alcohol use and chronic heart failure.   Echo report from 04/20/20 reviewed and showed an EF of 50-55% along with mild LAE.  Catheterization done 09/10/18 and showed: Normal coronaries, RCA had anterior take off and was non selective and normal LVEF.  Was in the ED 05/12/20 due to chest pain, shortness of breath and bilateral arm/leg pain. No PE or aneurysm. Troponins are negative. Released with f/u with his PCP. Admitted 04/20/20 due to acute on chronic HF along with hyponatremia. Given IV lasix along with tolvaptan. Initial sodium level was 113. Head CT negative for stroke. Discharged after 4 days.   He presents today for a follow-up visit with a chief complaint of   Has lost his insurance and isn't able to sign up again until ~ November 2021 so is currently uninsured. Has been out of his eliquis due to cost. Was not able to see cardiology or PCP due to this. Has been seen at [facility] in the past.   Past Medical History:  Diagnosis Date  . A-fib (HCC)   . Anxiety   . CHF (congestive heart failure) (HCC)   . Depression   . Hypersomnia with sleep apnea   . Hypertension   . Morbid obesity (HCC)   . NSTEMI (non-ST elevated myocardial infarction) (HCC) 2005  . Obesity hypoventilation syndrome (HCC)   . Shortness of breath dyspnea   . Sleep apnea   . Stroke (HCC)   . V-tach [facility])    Past Surgical History:  Procedure Laterality Date  . LEFT HEART CATH AND CORONARY ANGIOGRAPHY Right 09/10/2018   Procedure: LEFT HEART CATH AND CORONARY ANGIOGRAPHY with possible percutaneous intervention;  Surgeon: Laurier Nancy, MD;  Location: [facility] INVASIVE CV LAB;  Service: Cardiovascular;  Laterality: Right;  .  TRACHEOSTOMY     Family History  Problem Relation Age of Onset  . Heart disease Father   . Diabetes type II Mother   . Breast cancer Mother    Social History   Tobacco Use  . Smoking status: Current Every Day Smoker    Packs/day: 0.25    Years: 28.00    Pack years: 7.00    Types: Cigarettes  . Smokeless tobacco: Never Used  Substance Use Topics  . Alcohol use: Yes    Alcohol/week: 4.0 standard drinks    Types: 4 Cans of beer per week   No Known Allergies    Review of Systems  Constitutional: Positive for fatigue. Negative for appetite change.  HENT: Positive for congestion. Negative for postnasal drip and sore throat.   Eyes: Negative.   Respiratory: Positive for shortness of breath (with moderate exertion). Negative for cough and wheezing.   Cardiovascular: Negative for chest pain, palpitations and leg swelling.  Gastrointestinal: Negative for abdominal distention and abdominal pain.  Endocrine: Negative.   Genitourinary: Negative.   Musculoskeletal: Positive for arthralgias (leg pain). Negative for back pain.  Skin: Negative.   Allergic/Immunologic: Negative.   Neurological: Negative for dizziness and light-headedness.  Hematological: Negative for adenopathy. Does not bruise/bleed easily.  Psychiatric/Behavioral: Positive for sleep disturbance (sleeping on 1 pillow). Negative for dysphoric mood. The patient is not nervous/anxious.     Physical Exam Vitals and nursing  note reviewed.  Constitutional:      Appearance: Normal appearance.  HENT:     Head: Normocephalic and atraumatic.  Cardiovascular:     Rate and Rhythm: Normal rate. Rhythm irregular.  Pulmonary:     Effort: Pulmonary effort is normal. No respiratory distress.     Breath sounds: No wheezing or rales.  Abdominal:     General: There is no distension.     Palpations: Abdomen is soft.  Musculoskeletal:        General: No tenderness.     Cervical back: Normal range of motion and neck supple.      Right lower leg: Edema (trace pitting) present.     Left lower leg: Edema (trace pitting) present.  Skin:    General: Skin is warm and dry.  Neurological:     General: No focal deficit present.     Mental Status: He is alert and oriented to person, place, and time.  Psychiatric:        Mood and Affect: Mood normal.        Behavior: Behavior normal.        Thought Content: Thought content normal.    Assessment & Plan:  1: Chronic heart failure with preserved ejection fraction with mild structural changes- - NYHA class II - euvolemic today - weighing daily; reminded to call for an overnight weight gain of >2 pounds or a weekly weight gain of >5 pounds - weight 242.2 pounds from last visit here 1 month ago - not adding salt and says that he rinses his canned foods off 2-3 times before using; does try to use fresh vegetables when possible - was unable to see Dr. Welton Flakes in June because he lost his insurance - BNP 05/12/20 was 390.4 - reports receiving his first COVID vaccine yesterday  2: HTN- - BP  - was unable to see PCP (Tejan-Sie) on 05/04/20 due to losing his insurance - he says that he has been to [facility] in the past and that telephone number was put on his AVS for him to call and get re-established with them - BMP 05/12/20 reviewed and showed sodium 136, potassium 3.4, creatinine 0.83 and GFR >60  3: COPD- - continues to smoke 4-5 cigarettes daily - wears oxygen at 3L at bedtime (does not have CPAP)  4: Atrial fibrillation- - has been off eliquis again because he lost his insurance and is unable to afford it; 1 month samples given to him - [facility] brochure given to him and he says that he will stop over there after leaving here; secure message also sent to them - has already had previous stroke  5: Alcohol use- - drinks one twenty-ounce cans of beer every other day - cessation discussed for 3 minutes with him  6: Lymphedema- - stage 2 - quite  active and does a lot of walking - encouraged him to elevate his legs when sitting for long periods of time as well as get compression socks to put on every morning with removal at bedtime - consider lymphapress compression boots if edema persists   Patient did not bring his medications nor a list. Each medication was verbally reviewed with the patient and he was encouraged to bring the bottles to every visit to confirm accuracy of list.

## 2020-08-11 ENCOUNTER — Ambulatory Visit: Payer: Medicare Other | Admitting: Family

## 2020-08-11 ENCOUNTER — Telehealth: Payer: Self-pay | Admitting: Family

## 2020-08-11 NOTE — Telephone Encounter (Signed)
Patient did not show for his [facility] appointment on 08/11/20. Will attempt to reschedule.

## 2020-09-20 DIAGNOSIS — G4731 Primary central sleep apnea: Secondary | ICD-10-CM | POA: Diagnosis not present

## 2020-09-20 DIAGNOSIS — Z930 Tracheostomy status: Secondary | ICD-10-CM | POA: Diagnosis not present

## 2020-09-20 DIAGNOSIS — I209 Angina pectoris, unspecified: Secondary | ICD-10-CM | POA: Diagnosis not present

## 2020-09-20 DIAGNOSIS — E782 Mixed hyperlipidemia: Secondary | ICD-10-CM | POA: Diagnosis not present

## 2020-09-20 DIAGNOSIS — I63549 Cerebral infarction due to unspecified occlusion or stenosis of unspecified cerebellar artery: Secondary | ICD-10-CM | POA: Diagnosis not present

## 2020-09-20 DIAGNOSIS — I160 Hypertensive urgency: Secondary | ICD-10-CM | POA: Diagnosis not present

## 2020-09-20 DIAGNOSIS — E1100 Type 2 diabetes mellitus with hyperosmolarity without nonketotic hyperglycemic-hyperosmolar coma (NKHHC): Secondary | ICD-10-CM | POA: Diagnosis not present

## 2020-09-20 DIAGNOSIS — R69 Illness, unspecified: Secondary | ICD-10-CM | POA: Diagnosis not present

## 2020-09-20 DIAGNOSIS — E559 Vitamin D deficiency, unspecified: Secondary | ICD-10-CM | POA: Diagnosis not present

## 2020-09-20 DIAGNOSIS — I279 Pulmonary heart disease, unspecified: Secondary | ICD-10-CM | POA: Diagnosis not present

## 2020-09-20 DIAGNOSIS — I509 Heart failure, unspecified: Secondary | ICD-10-CM | POA: Diagnosis not present

## 2020-09-20 DIAGNOSIS — I10 Essential (primary) hypertension: Secondary | ICD-10-CM | POA: Diagnosis not present

## 2020-09-20 DIAGNOSIS — R0602 Shortness of breath: Secondary | ICD-10-CM | POA: Diagnosis not present

## 2020-09-20 DIAGNOSIS — I480 Paroxysmal atrial fibrillation: Secondary | ICD-10-CM | POA: Diagnosis not present

## 2020-10-05 DIAGNOSIS — I63549 Cerebral infarction due to unspecified occlusion or stenosis of unspecified cerebellar artery: Secondary | ICD-10-CM | POA: Diagnosis not present

## 2020-10-05 DIAGNOSIS — E785 Hyperlipidemia, unspecified: Secondary | ICD-10-CM | POA: Diagnosis not present

## 2020-10-05 DIAGNOSIS — I4891 Unspecified atrial fibrillation: Secondary | ICD-10-CM | POA: Diagnosis not present

## 2020-10-05 DIAGNOSIS — R0602 Shortness of breath: Secondary | ICD-10-CM | POA: Diagnosis not present

## 2020-10-05 DIAGNOSIS — D649 Anemia, unspecified: Secondary | ICD-10-CM | POA: Diagnosis not present

## 2020-10-05 DIAGNOSIS — I10 Essential (primary) hypertension: Secondary | ICD-10-CM | POA: Diagnosis not present

## 2020-10-05 DIAGNOSIS — G4731 Primary central sleep apnea: Secondary | ICD-10-CM | POA: Diagnosis not present

## 2020-10-05 DIAGNOSIS — J449 Chronic obstructive pulmonary disease, unspecified: Secondary | ICD-10-CM | POA: Diagnosis not present

## 2020-10-05 DIAGNOSIS — R69 Illness, unspecified: Secondary | ICD-10-CM | POA: Diagnosis not present

## 2020-10-05 DIAGNOSIS — I209 Angina pectoris, unspecified: Secondary | ICD-10-CM | POA: Diagnosis not present

## 2020-10-05 DIAGNOSIS — Z930 Tracheostomy status: Secondary | ICD-10-CM | POA: Diagnosis not present

## 2020-10-05 DIAGNOSIS — I509 Heart failure, unspecified: Secondary | ICD-10-CM | POA: Diagnosis not present

## 2020-10-05 DIAGNOSIS — I160 Hypertensive urgency: Secondary | ICD-10-CM | POA: Diagnosis not present

## 2020-10-05 DIAGNOSIS — E119 Type 2 diabetes mellitus without complications: Secondary | ICD-10-CM | POA: Diagnosis not present

## 2020-10-15 DIAGNOSIS — E1159 Type 2 diabetes mellitus with other circulatory complications: Secondary | ICD-10-CM | POA: Diagnosis not present

## 2020-10-25 DIAGNOSIS — Z596 Low income: Secondary | ICD-10-CM | POA: Diagnosis not present

## 2020-10-25 DIAGNOSIS — J449 Chronic obstructive pulmonary disease, unspecified: Secondary | ICD-10-CM | POA: Diagnosis not present

## 2020-10-25 DIAGNOSIS — R69 Illness, unspecified: Secondary | ICD-10-CM | POA: Diagnosis not present

## 2020-10-25 DIAGNOSIS — Z6838 Body mass index (BMI) 38.0-38.9, adult: Secondary | ICD-10-CM | POA: Diagnosis not present

## 2020-10-25 DIAGNOSIS — Z7982 Long term (current) use of aspirin: Secondary | ICD-10-CM | POA: Diagnosis not present

## 2020-10-25 DIAGNOSIS — R609 Edema, unspecified: Secondary | ICD-10-CM | POA: Diagnosis not present

## 2020-10-25 DIAGNOSIS — Z7901 Long term (current) use of anticoagulants: Secondary | ICD-10-CM | POA: Diagnosis not present

## 2020-10-25 DIAGNOSIS — I4891 Unspecified atrial fibrillation: Secondary | ICD-10-CM | POA: Diagnosis not present

## 2020-10-25 DIAGNOSIS — I10 Essential (primary) hypertension: Secondary | ICD-10-CM | POA: Diagnosis not present

## 2020-11-10 DIAGNOSIS — J40 Bronchitis, not specified as acute or chronic: Secondary | ICD-10-CM | POA: Diagnosis not present

## 2020-11-10 DIAGNOSIS — R609 Edema, unspecified: Secondary | ICD-10-CM | POA: Diagnosis not present

## 2020-11-10 DIAGNOSIS — G4731 Primary central sleep apnea: Secondary | ICD-10-CM | POA: Diagnosis not present

## 2020-11-10 DIAGNOSIS — I63549 Cerebral infarction due to unspecified occlusion or stenosis of unspecified cerebellar artery: Secondary | ICD-10-CM | POA: Diagnosis not present

## 2020-11-10 DIAGNOSIS — E559 Vitamin D deficiency, unspecified: Secondary | ICD-10-CM | POA: Diagnosis not present

## 2020-11-10 DIAGNOSIS — I509 Heart failure, unspecified: Secondary | ICD-10-CM | POA: Diagnosis not present

## 2020-11-10 DIAGNOSIS — I10 Essential (primary) hypertension: Secondary | ICD-10-CM | POA: Diagnosis not present

## 2020-11-10 DIAGNOSIS — Z930 Tracheostomy status: Secondary | ICD-10-CM | POA: Diagnosis not present

## 2020-11-10 DIAGNOSIS — E785 Hyperlipidemia, unspecified: Secondary | ICD-10-CM | POA: Diagnosis not present

## 2020-11-10 DIAGNOSIS — I4891 Unspecified atrial fibrillation: Secondary | ICD-10-CM | POA: Diagnosis not present

## 2020-11-10 DIAGNOSIS — I160 Hypertensive urgency: Secondary | ICD-10-CM | POA: Diagnosis not present

## 2020-11-10 DIAGNOSIS — R69 Illness, unspecified: Secondary | ICD-10-CM | POA: Diagnosis not present

## 2020-12-13 DIAGNOSIS — Z20822 Contact with and (suspected) exposure to covid-19: Secondary | ICD-10-CM | POA: Diagnosis not present

## 2020-12-24 DIAGNOSIS — I10 Essential (primary) hypertension: Secondary | ICD-10-CM | POA: Diagnosis not present

## 2020-12-24 DIAGNOSIS — I6389 Other cerebral infarction: Secondary | ICD-10-CM | POA: Diagnosis not present

## 2020-12-24 DIAGNOSIS — E785 Hyperlipidemia, unspecified: Secondary | ICD-10-CM | POA: Diagnosis not present

## 2020-12-24 DIAGNOSIS — R609 Edema, unspecified: Secondary | ICD-10-CM | POA: Diagnosis not present

## 2020-12-24 DIAGNOSIS — I4891 Unspecified atrial fibrillation: Secondary | ICD-10-CM | POA: Diagnosis not present

## 2021-03-11 DIAGNOSIS — M5412 Radiculopathy, cervical region: Secondary | ICD-10-CM | POA: Diagnosis not present

## 2021-03-11 DIAGNOSIS — I509 Heart failure, unspecified: Secondary | ICD-10-CM | POA: Diagnosis not present

## 2021-03-11 DIAGNOSIS — Z930 Tracheostomy status: Secondary | ICD-10-CM | POA: Diagnosis not present

## 2021-03-11 DIAGNOSIS — I160 Hypertensive urgency: Secondary | ICD-10-CM | POA: Diagnosis not present

## 2021-03-11 DIAGNOSIS — R69 Illness, unspecified: Secondary | ICD-10-CM | POA: Diagnosis not present

## 2021-03-11 DIAGNOSIS — G4731 Primary central sleep apnea: Secondary | ICD-10-CM | POA: Diagnosis not present

## 2021-03-11 DIAGNOSIS — I63549 Cerebral infarction due to unspecified occlusion or stenosis of unspecified cerebellar artery: Secondary | ICD-10-CM | POA: Diagnosis not present

## 2021-03-15 DIAGNOSIS — M5412 Radiculopathy, cervical region: Secondary | ICD-10-CM | POA: Diagnosis not present

## 2021-03-25 DIAGNOSIS — I160 Hypertensive urgency: Secondary | ICD-10-CM | POA: Diagnosis not present

## 2021-03-25 DIAGNOSIS — I10 Essential (primary) hypertension: Secondary | ICD-10-CM | POA: Diagnosis not present

## 2021-03-25 DIAGNOSIS — I63549 Cerebral infarction due to unspecified occlusion or stenosis of unspecified cerebellar artery: Secondary | ICD-10-CM | POA: Diagnosis not present

## 2021-03-28 ENCOUNTER — Observation Stay: Payer: Medicare HMO

## 2021-03-28 ENCOUNTER — Inpatient Hospital Stay
Admission: EM | Admit: 2021-03-28 | Discharge: 2021-03-30 | DRG: 683 | Disposition: A | Discharge status: Home / Self Care | Payer: Medicare HMO | Attending: Internal Medicine | Admitting: Internal Medicine

## 2021-03-28 ENCOUNTER — Other Ambulatory Visit: Payer: Self-pay

## 2021-03-28 DIAGNOSIS — I252 Old myocardial infarction: Secondary | ICD-10-CM

## 2021-03-28 DIAGNOSIS — Z833 Family history of diabetes mellitus: Secondary | ICD-10-CM

## 2021-03-28 DIAGNOSIS — R42 Dizziness and giddiness: Secondary | ICD-10-CM | POA: Diagnosis not present

## 2021-03-28 DIAGNOSIS — Z8249 Family history of ischemic heart disease and other diseases of the circulatory system: Secondary | ICD-10-CM

## 2021-03-28 DIAGNOSIS — D649 Anemia, unspecified: Secondary | ICD-10-CM | POA: Diagnosis present

## 2021-03-28 DIAGNOSIS — G4733 Obstructive sleep apnea (adult) (pediatric): Secondary | ICD-10-CM | POA: Diagnosis present

## 2021-03-28 DIAGNOSIS — F419 Anxiety disorder, unspecified: Secondary | ICD-10-CM | POA: Diagnosis present

## 2021-03-28 DIAGNOSIS — Z7901 Long term (current) use of anticoagulants: Secondary | ICD-10-CM

## 2021-03-28 DIAGNOSIS — F32A Depression, unspecified: Secondary | ICD-10-CM | POA: Diagnosis present

## 2021-03-28 DIAGNOSIS — N281 Cyst of kidney, acquired: Secondary | ICD-10-CM | POA: Diagnosis not present

## 2021-03-28 DIAGNOSIS — I5032 Chronic diastolic (congestive) heart failure: Secondary | ICD-10-CM | POA: Diagnosis present

## 2021-03-28 DIAGNOSIS — E662 Morbid (severe) obesity with alveolar hypoventilation: Secondary | ICD-10-CM | POA: Diagnosis present

## 2021-03-28 DIAGNOSIS — I480 Paroxysmal atrial fibrillation: Secondary | ICD-10-CM

## 2021-03-28 DIAGNOSIS — I482 Chronic atrial fibrillation, unspecified: Secondary | ICD-10-CM | POA: Diagnosis present

## 2021-03-28 DIAGNOSIS — I10 Essential (primary) hypertension: Secondary | ICD-10-CM

## 2021-03-28 DIAGNOSIS — Z79899 Other long term (current) drug therapy: Secondary | ICD-10-CM

## 2021-03-28 DIAGNOSIS — F17210 Nicotine dependence, cigarettes, uncomplicated: Secondary | ICD-10-CM | POA: Diagnosis present

## 2021-03-28 DIAGNOSIS — F418 Other specified anxiety disorders: Secondary | ICD-10-CM | POA: Diagnosis present

## 2021-03-28 DIAGNOSIS — N179 Acute kidney failure, unspecified: Secondary | ICD-10-CM | POA: Diagnosis not present

## 2021-03-28 DIAGNOSIS — Z720 Tobacco use: Secondary | ICD-10-CM | POA: Diagnosis present

## 2021-03-28 DIAGNOSIS — F331 Major depressive disorder, recurrent, moderate: Secondary | ICD-10-CM | POA: Diagnosis present

## 2021-03-28 DIAGNOSIS — Z6833 Body mass index (BMI) 33.0-33.9, adult: Secondary | ICD-10-CM

## 2021-03-28 DIAGNOSIS — Z803 Family history of malignant neoplasm of breast: Secondary | ICD-10-CM

## 2021-03-28 DIAGNOSIS — I110 Hypertensive heart disease with heart failure: Secondary | ICD-10-CM | POA: Diagnosis present

## 2021-03-28 DIAGNOSIS — Z20822 Contact with and (suspected) exposure to covid-19: Secondary | ICD-10-CM | POA: Diagnosis present

## 2021-03-28 DIAGNOSIS — Z8673 Personal history of transient ischemic attack (TIA), and cerebral infarction without residual deficits: Secondary | ICD-10-CM

## 2021-03-28 DIAGNOSIS — Z7982 Long term (current) use of aspirin: Secondary | ICD-10-CM

## 2021-03-28 DIAGNOSIS — E538 Deficiency of other specified B group vitamins: Secondary | ICD-10-CM

## 2021-03-28 LAB — COMPREHENSIVE METABOLIC PANEL
ALT: 13 U/L (ref 0–44)
AST: 23 U/L (ref 15–41)
Albumin: 4.2 g/dL (ref 3.5–5.0)
Alkaline Phosphatase: 43 U/L (ref 38–126)
Anion gap: 11 (ref 5–15)
BUN: 64 mg/dL — ABNORMAL HIGH (ref 6–20)
CO2: 28 mmol/L (ref 22–32)
Calcium: 9.6 mg/dL (ref 8.9–10.3)
Chloride: 100 mmol/L (ref 98–111)
Creatinine, Ser: 2.78 mg/dL — ABNORMAL HIGH (ref 0.61–1.24)
GFR, Estimated: 26 mL/min — ABNORMAL LOW (ref >60)
Glucose, Bld: 101 mg/dL — ABNORMAL HIGH (ref 70–99)
Potassium: 3.8 mmol/L (ref 3.5–5.1)
Sodium: 139 mmol/L (ref 135–145)
Total Bilirubin: 0.9 mg/dL (ref 0.3–1.2)
Total Protein: 8.2 g/dL — ABNORMAL HIGH (ref 6.5–8.1)

## 2021-03-28 LAB — CBC
HCT: 34.1 % — ABNORMAL LOW (ref 39.0–52.0)
Hemoglobin: 11.5 g/dL — ABNORMAL LOW (ref 13.0–17.0)
MCH: 33.2 pg (ref 26.0–34.0)
MCHC: 33.7 g/dL (ref 30.0–36.0)
MCV: 98.6 fL (ref 80.0–100.0)
Platelets: 175 10*3/uL (ref 150–400)
RBC: 3.46 MIL/uL — ABNORMAL LOW (ref 4.22–5.81)
RDW: 14.3 % (ref 11.5–15.5)
WBC: 5.1 10*3/uL (ref 4.0–10.5)
nRBC: 0 % (ref 0.0–0.2)

## 2021-03-28 LAB — BASIC METABOLIC PANEL
Anion gap: 12 (ref 5–15)
BUN: 60 mg/dL — ABNORMAL HIGH (ref 6–20)
CO2: 28 mmol/L (ref 22–32)
Calcium: 9.2 mg/dL (ref 8.9–10.3)
Chloride: 99 mmol/L (ref 98–111)
Creatinine, Ser: 2.57 mg/dL — ABNORMAL HIGH (ref 0.61–1.24)
GFR, Estimated: 29 mL/min — ABNORMAL LOW (ref >60)
Glucose, Bld: 125 mg/dL — ABNORMAL HIGH (ref 70–99)
Potassium: 3.6 mmol/L (ref 3.5–5.1)
Sodium: 139 mmol/L (ref 135–145)

## 2021-03-28 LAB — RESP PANEL BY RT-PCR (FLU A&B, COVID) ARPGX2
Influenza A by PCR: NEGATIVE
Influenza B by PCR: NEGATIVE
SARS Coronavirus 2 by RT PCR: NEGATIVE

## 2021-03-28 MED ORDER — ASPIRIN EC 81 MG PO TBEC
81.0000 mg | DELAYED_RELEASE_TABLET | Freq: Every day | ORAL | Status: DC
  Administered 2021-03-29 – 2021-03-30 (×2): 81 mg via ORAL
  Filled 2021-03-28 (×2): qty 1

## 2021-03-28 MED ORDER — SODIUM CHLORIDE 0.9 % IV BOLUS
1000.0000 mL | Freq: Once | INTRAVENOUS | Status: AC
  Administered 2021-03-28: 1000 mL via INTRAVENOUS

## 2021-03-28 MED ORDER — ACETAMINOPHEN 650 MG RE SUPP: 650.0000 mg | Freq: Four times a day (QID) | RECTAL | Status: DC | PRN

## 2021-03-28 MED ORDER — ACETAMINOPHEN 325 MG PO TABS: 650.0000 mg | ORAL_TABLET | Freq: Four times a day (QID) | ORAL | Status: DC | PRN

## 2021-03-28 MED ORDER — HYDRALAZINE HCL 50 MG PO TABS
100.0000 mg | ORAL_TABLET | Freq: Three times a day (TID) | ORAL | Status: DC
  Administered 2021-03-29 – 2021-03-30 (×4): 100 mg via ORAL
  Filled 2021-03-28 (×4): qty 2

## 2021-03-28 MED ORDER — ONDANSETRON HCL 4 MG PO TABS: 4.0000 mg | ORAL_TABLET | Freq: Four times a day (QID) | ORAL | Status: DC | PRN

## 2021-03-28 MED ORDER — ONDANSETRON HCL 4 MG/2ML IJ SOLN: 4.0000 mg | Freq: Four times a day (QID) | INTRAMUSCULAR | Status: DC | PRN

## 2021-03-28 MED ORDER — LISINOPRIL 10 MG PO TABS: 20.0000 mg | ORAL_TABLET | Freq: Every day | ORAL | Status: DC

## 2021-03-28 MED ORDER — LACTATED RINGERS IV SOLN: INTRAVENOUS | Status: AC

## 2021-03-28 MED ORDER — LACTATED RINGERS IV SOLN: INTRAVENOUS | Status: DC

## 2021-03-28 MED ORDER — AMIODARONE HCL 200 MG PO TABS
200.0000 mg | ORAL_TABLET | Freq: Every day | ORAL | Status: DC
  Administered 2021-03-29 – 2021-03-30 (×2): 200 mg via ORAL
  Filled 2021-03-28 (×3): qty 1

## 2021-03-28 MED ORDER — FLUOXETINE HCL 20 MG PO CAPS
20.0000 mg | ORAL_CAPSULE | Freq: Every day | ORAL | Status: DC
  Administered 2021-03-29: 11:00:00 20 mg via ORAL
  Filled 2021-03-28 (×2): qty 1

## 2021-03-28 MED ORDER — APIXABAN 5 MG PO TABS
5.0000 mg | ORAL_TABLET | Freq: Two times a day (BID) | ORAL | Status: DC
  Administered 2021-03-28 – 2021-03-30 (×4): 5 mg via ORAL
  Filled 2021-03-28 (×4): qty 1

## 2021-03-28 NOTE — ED Notes (Signed)
PA student at bedside.

## 2021-03-28 NOTE — ED Notes (Signed)
Spoke with Cox, MD. If BMP redraw at 1800 is improved, PT can be discharged from ED with instructions to follow up.

## 2021-03-28 NOTE — ED Provider Notes (Signed)
[facility] Emergency Department Provider Note   ____________________________________________   I have reviewed the triage vital signs and the nursing notes.   HISTORY  Chief Complaint Abnormal Lab   History limited by: Not Limited   HPI Andrew Sparks is a 55 y.o. male who presents to the emergency department today because of concern for abnormal blood work. Patient states he had blood work checked 3 days ago as part of a scheduled doctors appointment. Was told by his doctor that his kidney function was bad and to come to the emergency department. Patient states he has felt a couple episodes of dizziness. Denies any fatigue. Denies any new or change in medication. Denies any unusual activity or exercise. Denies any change in urination.    Records reviewed. Per medical record review patient has a history of acute renal failure a few years ago thought to be due to atn and dehydration.   Past Medical History:  Diagnosis Date  . A-fib (HCC)   . Anxiety   . CHF (congestive heart failure) (HCC)   . Depression   . Hypersomnia with sleep apnea   . Hypertension   . Morbid obesity (HCC)   . NSTEMI (non-ST elevated myocardial infarction) (HCC) 2005  . Obesity hypoventilation syndrome (HCC)   . Shortness of breath dyspnea   . Sleep apnea   . Stroke (HCC)   . V-tach [facility])     Patient Active Problem List   Diagnosis Date Noted  . Hyponatremia 04/20/2020  . Accelerated hypertension   . Acute on chronic diastolic CHF (congestive heart failure) (HCC)   . Atrial fibrillation, chronic (HCC)   . COPD with acute exacerbation (HCC)   . Depression with anxiety   . Alcohol abuse   . Tobacco abuse   . Tobacco abuse counseling   . Ischemic stroke (HCC) 04/20/2019  . ARF (acute renal failure) (HCC) 03/26/2018  . CHF (congestive heart failure) (HCC) 07/17/2017  . 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 Panic disorder 01/18/2017  . Chest pain  01/17/2017  . Obstructive sleep apnea syndrome 02/01/2016  . Tracheostomy care (HCC)   . 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 Debility 12/16/2014  . Obesity hypoventilation syndrome (HCC) 12/16/2014  . Encephalopathy pulmonary (HCC) 12/15/2014    Past Surgical History:  Procedure Laterality Date  . LEFT HEART CATH AND CORONARY ANGIOGRAPHY Right 09/10/2018   Procedure: LEFT HEART CATH AND CORONARY ANGIOGRAPHY with possible percutaneous intervention;  Surgeon: Laurier Nancy, MD;  Location: [facility] INVASIVE CV LAB;  Service: Cardiovascular;  Laterality: Right;  . TRACHEOSTOMY      Prior to Admission medications   Medication Sig Start Date End Date Taking? Authorizing Provider  amiodarone (PACERONE) 200 MG tablet Take 200 mg by mouth daily.    [provider]  apixaban (ELIQUIS) 5 MG TABS tablet Take 1 tablet (5 mg total) by mouth 2 (two) times daily. 04/24/20   Marrion Coy, MD  aspirin EC 81 MG tablet Take 81 mg by mouth daily.    [provider]  FLUoxetine (PROZAC) 20 MG capsule Take 20 mg by mouth daily.    [provider]  furosemide (LASIX) 40 MG tablet Take 1 tablet (40 mg total) by mouth 2 (two) times daily. 04/24/20 04/24/21  Marrion Coy, MD  hydrALAZINE (APRESOLINE) 100 MG tablet Take 100 mg by mouth 3 (three) times daily.    [provider]  HYDROcodone-acetaminophen (NORCO/VICODIN) 5-325  MG tablet Take 1 tablet by mouth every 6 (six) hours as needed for moderate pain. 05/12/20   Arnaldo Natal, MD  lisinopril (ZESTRIL) 20 MG tablet Take 20 mg by mouth daily.    [provider]  metoprolol tartrate (LOPRESSOR) 50 MG tablet Take 75 mg by mouth 2 (two) times daily.    [provider]    Allergies Patient has no known allergies.  Family History  Problem Relation Age of Onset  . Heart disease Father   . Diabetes type II Mother   . Breast cancer Mother     Social  History Social History   Tobacco Use  . Smoking status: Current Every Day Smoker    Packs/day: 0.25    Years: 28.00    Pack years: 7.00    Types: Cigarettes  . Smokeless tobacco: Never Used  Vaping Use  . Vaping Use: Never used  Substance Use Topics  . Alcohol use: Yes    Alcohol/week: 4.0 standard drinks    Types: 4 Cans of beer per week  . Drug use: No    Review of Systems Constitutional: No fever/chills Eyes: No visual changes. ENT: No sore throat. Cardiovascular: Denies chest pain. Respiratory: Denies shortness of breath. Gastrointestinal: No abdominal pain.  No nausea, no vomiting.  No diarrhea.   Genitourinary: Negative for dysuria. Musculoskeletal: Negative for back pain. Skin: Negative for rash. Neurological: Negative for headaches, focal weakness or numbness. Positive for occasional episodes of dehydration.   ____________________________________________   PHYSICAL EXAM:  VITAL SIGNS: ED Triage Vitals  Enc Vitals Group     BP 03/28/21 1447 119/86     Pulse Rate 03/28/21 1447 95     Resp 03/28/21 1447 18     Temp 03/28/21 1447 (!) 97 F (36.1 C)     Temp Source 03/28/21 1458 Oral     SpO2 03/28/21 1447 95 %     Weight --      Height --      Head Circumference --      Peak Flow --      Pain Score 03/28/21 1446 0   Constitutional: Alert and oriented.  Eyes: Conjunctivae are normal.  ENT      Head: Normocephalic and atraumatic.      Nose: No congestion/rhinnorhea.      Mouth/Throat: Mucous membranes are moist.      Neck: No stridor. Hematological/Lymphatic/Immunilogical: No cervical lymphadenopathy. Cardiovascular: Normal rate, regular rhythm.  No murmurs, rubs, or gallops.  Respiratory: Normal respiratory effort without tachypnea nor retractions. Breath sounds are clear and equal bilaterally. No wheezes/rales/rhonchi. Gastrointestinal: Soft and non tender. No rebound. No guarding.  Genitourinary: Deferred Musculoskeletal: Normal range of motion in  all extremities. No lower extremity edema. Neurologic:  Normal speech and language. No gross focal neurologic deficits are appreciated.  Skin:  Skin is warm, dry and intact. No rash noted. Psychiatric: Mood and affect are normal. Speech and behavior are normal. Patient exhibits appropriate insight and judgment.  ____________________________________________    LABS (pertinent positives/negatives)  CBC wbc 5.1, hgb 11.5, plt 175 CMP na 139, k 3.8, glu 101, cr 2.78  ____________________________________________   EKG  None  ____________________________________________    RADIOLOGY  None  ____________________________________________   PROCEDURES  Procedures  ____________________________________________   INITIAL IMPRESSION / ASSESSMENT AND PLAN / ED COURSE  Pertinent labs & imaging results that were available during my care of the patient were reviewed by me and considered in my medical decision making (see  chart for details).   Patient presented to the emergency department today because of concern for abnormal creatinine level found on outpatient labs. Patient states that he has felt occasional episodes of dizziness however no other acute complaints. Blood work here is consistent with AKI. Will start IVFs and plan on admission. Discussed with patient.   ____________________________________________   FINAL CLINICAL IMPRESSION(S) / ED DIAGNOSES  Final diagnoses:  AKI (acute kidney injury) (HCC)  Dizziness     Note: This dictation was prepared with Dragon dictation. Any transcriptional errors that result from this process are unintentional     Phineas Semen, MD 03/28/21 [phone]

## 2021-03-28 NOTE — ED Notes (Signed)
Patient denies pain and is resting comfortably. Call light in reach. Fall precautions in place.

## 2021-03-28 NOTE — H&P (Addendum)
History and Physical   Andrew Sparks RAQ:[id number] DOB: Dec 21, 1965 DOA: 03/28/2021  PCP: Sherron Monday, MD  Patient coming from: Home  I have personally briefly reviewed patient's old medical records in [facility] EMR.  Chief Concern: Abnormal lab  HPI: Andrew Sparks is a 55 y.o. male with medical history significant for osa not on cpap yet, hypertension, tobacco use, presents to the emergency department for chief concerns of abnormal labs.  He was told by his PCP to present to the emergency department due to abnormal lab.  He is otherwise has no complaints.  He denies dysuria, chest pain, shortness of breath, headaches, abdominal pain, diarrhea, lower extremity swelling, diarrhea, fever, chills, cough, shortness of breath.  He does endorse that over the last couple of months he has not been drinking water regularly.  Instead he does endorse that he has been drinking more soda.  He states that he does drink beer, however states that he drinks approximately 5-6 beers per week.  He denies history of seizures or tremors.  Social history: lives by himself. He formerly worked in [employer] and is disabled.   Vaccinations: 2 doses of COVID-19 via Moderna  ROS: Constitutional: no weight change, no fever ENT/Mouth: no sore throat, no rhinorrhea Eyes: no eye pain, no vision changes Cardiovascular: no chest pain, no dyspnea,  no edema, no palpitations Respiratory: no cough, no sputum, no wheezing Gastrointestinal: no nausea, no vomiting, no diarrhea, no constipation Genitourinary: no urinary incontinence, no dysuria, no hematuria Musculoskeletal: no arthralgias, no myalgias Skin: no skin lesions, no pruritus, Neuro: no weakness, no loss of consciousness, no syncope Psych: no anxiety, no depression, no decrease appetite Heme/Lymph: no bruising, no bleeding  ED Course: Discussed with EDP, patient requiring hospitalization for acute kidney injury.  Vitals in the emergency  department was remarkable for temperature of 97.7, respiration rate of 18, heart rate 79, blood pressure 91/79.  At bedside systolic blood pressure ranged from 95-1 10.  Patient SPO2 at 95% on room air.  He is status post 1 L normal saline per ED provider.  Assessment/Plan  Principal Problem:   AKI (acute kidney injury) (HCC) Active Problems:   Moderate recurrent major depression (HCC)   Obstructive sleep apnea syndrome   Atrial fibrillation, chronic (HCC)   Depression with anxiety   Tobacco abuse   Essential hypertension   Acute kidney injury-suspect secondary to poor p.o. water consumption - Baseline serum creatinine in 2021 was 0.54-0.88, no CKD - Advised patient to stop drinking soda in place of water in setting of furosemide use - Status post normal saline 1 L bolus per EDP - LR 150 mL/h, 1 bag ordered - Holding home lisinopril and Lasix - Would recommend primary care provider to refer patient to nephrology for outpatient regular follow-up - Repeat BMP at 1800 on day of evaluation showed minimal improvement in creatinine function - Ultrasound kidneys ordered - BMP scheduled for 03/30/2019 2 AM - Continue LR IVF - I secure chatted nephrology, Dr. Thedore Mins, and his office will call patient to schedule an appointment for follow-up  Hypertension- controlled - Holding home lisinopril - Hydralazine 100 mg p.o. 3 times daily resumed for 03/29/2021  Atrial fibrillation-resumed home Eliquis 5 mg twice daily, amiodarone 200 mg daily - Patient states he took his amiodarone on day of presentation already  Normocytic anemia-anemia panel ordered - Patient does not take NSAIDs daily - He does endorse taking NSAIDs every other day joint pain - Advised patient to stop doing  this - Patient denies melena stool, bright red blood per rectum, bloody vomitus or coffee-ground emesis - Advised outpatient colonoscopy  Obstructive sleep apnea-he is not on CPAP nightly yet - He has an appointment  for outpatient sleep study - Recommend patient keep appointment for outpatient sleep study - Recommended weight loss gust with PCP for safe weight loss management - CPAP nightly ordered  Healthcare maintenance - Patient has never had a colonoscopy, advised patient needs to talk with his PCP for GI referral for colonoscopy Chart reviewed.   Depression/anxiety-resume fluoxetine 20 mg daily  DVT prophylaxis: Eliquis 5 mg twice daily Code Status: Full code  Diet: Heart healthy Family Communication: No  Disposition Plan: Pending clinical course, anticipate discharge from the ED Consults called: No Admission status: MedSurg, observation, no telemetry indicated  Past Medical History:  Diagnosis Date  . A-fib (HCC)   . Anxiety   . CHF (congestive heart failure) (HCC)   . Depression   . Hypersomnia with sleep apnea   . Hypertension   . Morbid obesity (HCC)   . NSTEMI (non-ST elevated myocardial infarction) (HCC) 2005  . Obesity hypoventilation syndrome (HCC)   . Shortness of breath dyspnea   . Sleep apnea   . Stroke (HCC)   . V-tach [facility])    Past Surgical History:  Procedure Laterality Date  . LEFT HEART CATH AND CORONARY ANGIOGRAPHY Right 09/10/2018   Procedure: LEFT HEART CATH AND CORONARY ANGIOGRAPHY with possible percutaneous intervention;  Surgeon: Laurier Nancy, MD;  Location: [facility] INVASIVE CV LAB;  Service: Cardiovascular;  Laterality: Right;  . TRACHEOSTOMY     Social History:  reports that he has been smoking cigarettes. He has a 7.00 pack-year smoking history. He has never used smokeless tobacco. He reports current alcohol use of about 4.0 standard drinks of alcohol per week. He reports that he does not use drugs.  No Known Allergies Family History  Problem Relation Age of Onset  . Heart disease Father   . Diabetes type II Mother   . Breast cancer Mother    Family history: Family history reviewed and not pertinent  Prior to Admission medications   Medication Sig  Start Date End Date Taking? Authorizing Provider  amiodarone (PACERONE) 200 MG tablet Take 200 mg by mouth daily.    [provider]  apixaban (ELIQUIS) 5 MG TABS tablet Take 1 tablet (5 mg total) by mouth 2 (two) times daily. 04/24/20   Marrion Coy, MD  aspirin EC 81 MG tablet Take 81 mg by mouth daily.    [provider]  FLUoxetine (PROZAC) 20 MG capsule Take 20 mg by mouth daily.    [provider]  furosemide (LASIX) 40 MG tablet Take 1 tablet (40 mg total) by mouth 2 (two) times daily. 04/24/20 04/24/21  Marrion Coy, MD  hydrALAZINE (APRESOLINE) 100 MG tablet Take 100 mg by mouth 3 (three) times daily.    [provider]  HYDROcodone-acetaminophen (NORCO/VICODIN) 5-325 MG tablet Take 1 tablet by mouth every 6 (six) hours as needed for moderate pain. 05/12/20   Arnaldo Natal, MD  lisinopril (ZESTRIL) 20 MG tablet Take 20 mg by mouth daily.    [provider]  metoprolol tartrate (LOPRESSOR) 50 MG tablet Take 75 mg by mouth 2 (two) times daily.    [provider]   Physical Exam: Vitals:   03/28/21 1447 03/28/21 1458 03/28/21 1530 03/28/21 1550  BP: 119/86  91/70   Pulse: 95  79   Resp:  18  (!) 27   Temp: (!) 97 F (36.1 C) 97.7 F (36.5 C)    TempSrc:  Oral    SpO2: 95%  95%   Weight:    110 kg   Constitutional: appears age-appropriate, NAD, calm, comfortable Eyes: PERRL, lids and conjunctivae normal ENMT: Mucous membranes are moist. Posterior pharynx clear of any exudate or lesions. Age-appropriate dentition. Hearing appropriate Neck: normal, supple, no masses, no thyromegaly Respiratory: clear to auscultation bilaterally, no wheezing, no crackles. Normal respiratory effort. No accessory muscle use.  Cardiovascular: Regular rate and rhythm, no murmurs / rubs / gallops. No extremity edema. 2+ pedal pulses. No carotid bruits.  Abdomen: Obese abdomen, no tenderness, no masses palpated, no hepatosplenomegaly. Bowel sounds positive.   Musculoskeletal: no clubbing / cyanosis. No joint deformity upper and lower extremities. Good ROM, no contractures, no atrophy. Normal muscle tone.  Skin: no rashes, lesions, ulcers. No induration Neurologic: Sensation intact. Strength 5/5 in all 4.  Psychiatric: Normal judgment and insight. Alert and oriented x 3. Normal mood.   EKG: Not indicated  Chest x-ray on Admission: Not indicated  Labs on Admission: I have personally reviewed following labs  CBC: Recent Labs  Lab 03/28/21 1454  WBC 5.1  HGB 11.5*  HCT 34.1*  MCV 98.6  PLT 175   Basic Metabolic Panel: Recent Labs  Lab 03/28/21 1454  NA 139  K 3.8  CL 100  CO2 28  GLUCOSE 101*  BUN 64*  CREATININE 2.78*  CALCIUM 9.6   GFR: CrCl cannot be calculated (Unknown ideal weight.).  Liver Function Tests: Recent Labs  Lab 03/28/21 1454  AST 23  ALT 13  ALKPHOS 43  BILITOT 0.9  PROT 8.2*  ALBUMIN 4.2   Urine analysis:    Component Value Date/Time   COLORURINE YELLOW (A) 04/20/2020 1421   APPEARANCEUR CLEAR (A) 04/20/2020 1421   APPEARANCEUR Clear 12/11/2014 1147   LABSPEC 1.010 04/20/2020 1421   LABSPEC 1.020 12/11/2014 1147   PHURINE 6.0 04/20/2020 1421   GLUCOSEU NEGATIVE 04/20/2020 1421   GLUCOSEU Negative 12/11/2014 1147   HGBUR SMALL (A) 04/20/2020 1421   BILIRUBINUR NEGATIVE 04/20/2020 1421   BILIRUBINUR Negative 12/11/2014 1147   KETONESUR NEGATIVE 04/20/2020 1421   PROTEINUR >=300 (A) 04/20/2020 1421   NITRITE NEGATIVE 04/20/2020 1421   LEUKOCYTESUR NEGATIVE 04/20/2020 1421   LEUKOCYTESUR Negative 12/11/2014 1147   Andrew Sparks N Andrew Sparks D.O. [facility]  If 7PM-7AM, please contact overnight-coverage provider If 7AM-7PM, please contact day coverage provider [email or web address]  03/28/2021, 5:14 PM

## 2021-03-28 NOTE — ED Notes (Signed)
Report given to Jessica, RN.

## 2021-03-28 NOTE — ED Triage Notes (Signed)
Pt comes with c/o abnormal lab. Pt hs paperwork with elevated creatinine lab result. Pt denies any current complications.

## 2021-03-29 DIAGNOSIS — Z20822 Contact with and (suspected) exposure to covid-19: Secondary | ICD-10-CM | POA: Diagnosis not present

## 2021-03-29 DIAGNOSIS — Z803 Family history of malignant neoplasm of breast: Secondary | ICD-10-CM | POA: Diagnosis not present

## 2021-03-29 DIAGNOSIS — I480 Paroxysmal atrial fibrillation: Secondary | ICD-10-CM | POA: Diagnosis not present

## 2021-03-29 DIAGNOSIS — I110 Hypertensive heart disease with heart failure: Secondary | ICD-10-CM | POA: Diagnosis not present

## 2021-03-29 DIAGNOSIS — D649 Anemia, unspecified: Secondary | ICD-10-CM | POA: Diagnosis present

## 2021-03-29 DIAGNOSIS — F331 Major depressive disorder, recurrent, moderate: Secondary | ICD-10-CM | POA: Diagnosis present

## 2021-03-29 DIAGNOSIS — N179 Acute kidney failure, unspecified: Secondary | ICD-10-CM | POA: Diagnosis not present

## 2021-03-29 DIAGNOSIS — E538 Deficiency of other specified B group vitamins: Secondary | ICD-10-CM | POA: Diagnosis not present

## 2021-03-29 DIAGNOSIS — Z6833 Body mass index (BMI) 33.0-33.9, adult: Secondary | ICD-10-CM | POA: Diagnosis not present

## 2021-03-29 DIAGNOSIS — I10 Essential (primary) hypertension: Secondary | ICD-10-CM | POA: Diagnosis not present

## 2021-03-29 DIAGNOSIS — Z8673 Personal history of transient ischemic attack (TIA), and cerebral infarction without residual deficits: Secondary | ICD-10-CM | POA: Diagnosis not present

## 2021-03-29 DIAGNOSIS — Z8249 Family history of ischemic heart disease and other diseases of the circulatory system: Secondary | ICD-10-CM | POA: Diagnosis not present

## 2021-03-29 DIAGNOSIS — F419 Anxiety disorder, unspecified: Secondary | ICD-10-CM | POA: Diagnosis present

## 2021-03-29 DIAGNOSIS — R69 Illness, unspecified: Secondary | ICD-10-CM | POA: Diagnosis not present

## 2021-03-29 DIAGNOSIS — Z79899 Other long term (current) drug therapy: Secondary | ICD-10-CM | POA: Diagnosis not present

## 2021-03-29 DIAGNOSIS — I5032 Chronic diastolic (congestive) heart failure: Secondary | ICD-10-CM | POA: Diagnosis not present

## 2021-03-29 DIAGNOSIS — F17210 Nicotine dependence, cigarettes, uncomplicated: Secondary | ICD-10-CM | POA: Diagnosis present

## 2021-03-29 DIAGNOSIS — I252 Old myocardial infarction: Secondary | ICD-10-CM | POA: Diagnosis not present

## 2021-03-29 DIAGNOSIS — Z7901 Long term (current) use of anticoagulants: Secondary | ICD-10-CM | POA: Diagnosis not present

## 2021-03-29 DIAGNOSIS — Z7982 Long term (current) use of aspirin: Secondary | ICD-10-CM | POA: Diagnosis not present

## 2021-03-29 DIAGNOSIS — R42 Dizziness and giddiness: Secondary | ICD-10-CM | POA: Diagnosis not present

## 2021-03-29 DIAGNOSIS — I482 Chronic atrial fibrillation, unspecified: Secondary | ICD-10-CM | POA: Diagnosis not present

## 2021-03-29 DIAGNOSIS — Z833 Family history of diabetes mellitus: Secondary | ICD-10-CM | POA: Diagnosis not present

## 2021-03-29 DIAGNOSIS — E662 Morbid (severe) obesity with alveolar hypoventilation: Secondary | ICD-10-CM | POA: Diagnosis not present

## 2021-03-29 LAB — CBC
HCT: 32.2 % — ABNORMAL LOW (ref 39.0–52.0)
Hemoglobin: 10.8 g/dL — ABNORMAL LOW (ref 13.0–17.0)
MCH: 33.3 pg (ref 26.0–34.0)
MCHC: 33.5 g/dL (ref 30.0–36.0)
MCV: 99.4 fL (ref 80.0–100.0)
Platelets: 191 10*3/uL (ref 150–400)
RBC: 3.24 MIL/uL — ABNORMAL LOW (ref 4.22–5.81)
RDW: 14.1 % (ref 11.5–15.5)
WBC: 5.6 10*3/uL (ref 4.0–10.5)
nRBC: 0 % (ref 0.0–0.2)

## 2021-03-29 LAB — RETICULOCYTES
Immature Retic Fract: 9.8 % (ref 2.3–15.9)
RBC.: 3.13 MIL/uL — ABNORMAL LOW (ref 4.22–5.81)
Retic Count, Absolute: 83.9 10*3/uL (ref 19.0–186.0)
Retic Ct Pct: 2.7 % (ref 0.4–3.1)

## 2021-03-29 LAB — IRON AND TIBC
Iron: 77 ug/dL (ref 45–182)
Saturation Ratios: 23 % (ref 17.9–39.5)
TIBC: 333 ug/dL (ref 250–450)
UIBC: 256 ug/dL

## 2021-03-29 LAB — BASIC METABOLIC PANEL
Anion gap: 10 (ref 5–15)
BUN: 56 mg/dL — ABNORMAL HIGH (ref 6–20)
CO2: 29 mmol/L (ref 22–32)
Calcium: 9.2 mg/dL (ref 8.9–10.3)
Chloride: 98 mmol/L (ref 98–111)
Creatinine, Ser: 2.27 mg/dL — ABNORMAL HIGH (ref 0.61–1.24)
GFR, Estimated: 33 mL/min — ABNORMAL LOW (ref >60)
Glucose, Bld: 105 mg/dL — ABNORMAL HIGH (ref 70–99)
Potassium: 3.4 mmol/L — ABNORMAL LOW (ref 3.5–5.1)
Sodium: 137 mmol/L (ref 135–145)

## 2021-03-29 LAB — VITAMIN B12: Vitamin B-12: 167 pg/mL — ABNORMAL LOW (ref 180–914)

## 2021-03-29 LAB — FOLATE: Folate: 10.2 ng/mL (ref >5.9)

## 2021-03-29 LAB — FERRITIN: Ferritin: 281 ng/mL (ref 24–336)

## 2021-03-29 NOTE — Progress Notes (Signed)
PROGRESS NOTE    Andrew Sparks  HYQ:[id number] DOB: Sep 21, 1966 DOA: 03/28/2021 PCP: Sherron Monday, MD   Brief Narrative:  Andrew Sparks is a 55 y.o. male with medical history significant for osa not on cpap yet, hypertension, tobacco use, presents to the emergency department for chief concerns of abnormal labs. He was told by his PCP to present to the emergency department due to abnormal lab.  He is otherwise has no complaints.  He denies dysuria, chest pain, shortness of breath, headaches, abdominal pain, diarrhea, lower extremity swelling, diarrhea, fever, chills, cough, shortness of breath. He does endorse that over the last couple of months he has not been drinking water regularly.  He does report drinking soda as well as beer upwards of 5 to 6/week.  He denies history of seizures or tremors.  Hospitalist called for admission.  Assessment & Plan:   Principal Problem:   AKI (acute kidney injury) (HCC) Active Problems:   Moderate recurrent major depression (HCC)   Obstructive sleep apnea syndrome   Atrial fibrillation, chronic (HCC)   Depression with anxiety   Tobacco abuse   Essential hypertension   Acute kidney injury, likely prerenal azotemia due to poor PO intake - Baseline serum creatinine 0.54-0.88 Lab Results  Component Value Date   CREATININE 2.27 (H) 03/29/2021   CREATININE 2.57 (H) 03/28/2021   CREATININE 2.78 (H) 03/28/2021  -Unlikely alcohol-related given sodium remains essentially within normal limits -Discussed need for increased free water intake -Continue IV fluids, increase p.o. intake as appropriate -Holding home lisinopril and Lasix - Nephrology, Dr. Thedore Mins, and his office will call patient to schedule an appointment for follow-up  Hypertension- controlled - Holding home lisinopril - Hydralazine 100 mg p.o. 3 times daily resumed for 03/29/2021 -Certainly above could be new worsening kidney disease in the setting of uncontrolled  hypertension but patient reports adequate control and medication compliance  Atrial fibrillation-resumed home Eliquis 5 mg twice daily, amiodarone 200 mg daily - Patient states he took his amiodarone on day of presentation already  Normocytic anemia -Patient admits to frequent NSAID use, we discussed discontinuation in the setting of above -No signs or symptoms of bleeding, -Questionably chronic anemia of chronic disease, would benefit from further outpatient work-up    Component Value Date/Time   IRON 77 03/29/2021 0320   TIBC 333 03/29/2021 0320   FERRITIN 281 03/29/2021 0320   IRONPCTSAT 23 03/29/2021 0320    Obstructive sleep apnea, suspected -Unclear if patient has had a formal diagnosis of this condition -Outpatient sleep study pending per PCP -Discussed weight loss lifestyle management  Depression/anxiety- continue fluoxetine 20 mg daily  DVT prophylaxis: Eliquis 5 mg twice daily Code Status: Full code  Family Communication: None present  Status is: Inpatient  Dispo: The patient is from: Home              Anticipated d/c is to: Home              Anticipated d/c date is: 24 to 48 hours              Patient currently not medically stable for discharge given need for ongoing IV fluid  Consultants:   None  Procedures:   None  Antimicrobials:  None indicated  Subjective: No acute issues or events overnight, denies nausea vomiting diarrhea constipation headache fevers chills chest pain shortness of breath  Objective: Vitals:   03/28/21 2100 03/28/21 2158 03/29/21 0000 03/29/21 0530  BP: (!) 130/96 (!) 142/97 129/60 Marland Kitchen)  126/94  Pulse: 74 68 72 (!) 57  Resp: 18 16 17 18   Temp:  98 F (36.7 C) 98.2 F (36.8 C) 98.3 F (36.8 C)  TempSrc:  Oral Oral Oral  SpO2: 97% 100% 99% 98%  Weight:       No intake or output data in the 24 hours ending 03/29/21 0710 Filed Weights   03/28/21 1550  Weight: 110 kg    Examination:  General:  Pleasantly resting  in bed, No acute distress. HEENT:  Normocephalic atraumatic.  Sclerae nonicteric, noninjected.  Extraocular movements intact bilaterally. Neck:  Without mass or deformity.  Trachea is midline. Lungs:  Clear to auscultate bilaterally without rhonchi, wheeze, or rales. Heart:  Regular rate and rhythm.  Without murmurs, rubs, or gallops. Abdomen:  Soft, nontender, nondistended.  Without guarding or rebound. Extremities: Without cyanosis, clubbing, edema, or obvious deformity. Vascular:  Dorsalis pedis and posterior tibial pulses palpable bilaterally. Skin:  Warm and dry, no erythema, no ulcerations.   Data Reviewed: I have personally reviewed following labs and imaging studies  CBC: Recent Labs  Lab 03/28/21 1454 03/29/21 0320  WBC 5.1 5.6  HGB 11.5* 10.8*  HCT 34.1* 32.2*  MCV 98.6 99.4  PLT 175 191   Basic Metabolic Panel: Recent Labs  Lab 03/28/21 1454 03/28/21 1815 03/29/21 0320  NA 139 139 137  K 3.8 3.6 3.4*  CL 100 99 98  CO2 28 28 29   GLUCOSE 101* 125* 105*  BUN 64* 60* 56*  CREATININE 2.78* 2.57* 2.27*  CALCIUM 9.6 9.2 9.2   GFR: CrCl cannot be calculated (Unknown ideal weight.). Liver Function Tests: Recent Labs  Lab 03/28/21 1454  AST 23  ALT 13  ALKPHOS 43  BILITOT 0.9  PROT 8.2*  ALBUMIN 4.2   No results for input(s): LIPASE, AMYLASE in the last 168 hours. No results for input(s): AMMONIA in the last 168 hours. Coagulation Profile: No results for input(s): INR, PROTIME in the last 168 hours. Cardiac Enzymes: No results for input(s): CKTOTAL, CKMB, CKMBINDEX, TROPONINI in the last 168 hours. BNP (last 3 results) No results for input(s): PROBNP in the last 8760 hours. HbA1C: No results for input(s): HGBA1C in the last 72 hours. CBG: No results for input(s): GLUCAP in the last 168 hours. Lipid Profile: No results for input(s): CHOL, HDL, LDLCALC, TRIG, CHOLHDL, LDLDIRECT in the last 72 hours. Thyroid Function Tests: No results for input(s):  TSH, T4TOTAL, FREET4, T3FREE, THYROIDAB in the last 72 hours. Anemia Panel: Recent Labs    03/29/21 0320  FOLATE 10.2  FERRITIN 281  TIBC 333  IRON 77  RETICCTPCT 2.7   Sepsis Labs: No results for input(s): PROCALCITON, LATICACIDVEN in the last 168 hours.  Recent Results (from the past 240 hour(s))  Resp Panel by RT-PCR (Flu A&B, Covid) Nasopharyngeal Swab     Status: None   Collection Time: 03/28/21  3:43 PM   Specimen: Nasopharyngeal Swab; Nasopharyngeal(NP) swabs in vial transport medium  Result Value Ref Range Status   SARS Coronavirus 2 by RT PCR NEGATIVE NEGATIVE Final    Comment: (NOTE) SARS-CoV-2 target nucleic acids are NOT DETECTED.  The SARS-CoV-2 RNA is generally detectable in upper respiratory specimens during the acute phase of infection. The lowest concentration of SARS-CoV-2 viral copies this assay can detect is 138 copies/mL. A negative result does not preclude SARS-Cov-2 infection and should not be used as the sole basis for treatment or other patient management decisions. A negative result may occur with  improper  specimen collection/handling, submission of specimen other than nasopharyngeal swab, presence of viral mutation(s) within the areas targeted by this assay, and inadequate number of viral copies(<138 copies/mL). A negative result must be combined with clinical observations, patient history, and epidemiological information. The expected result is Negative.  Fact Sheet for Patients:  BloggerCourse.com  Fact Sheet for Healthcare Providers:  SeriousBroker.it  This test is no t yet approved or cleared by the [place] FDA and  has been authorized for detection and/or diagnosis of SARS-CoV-2 by FDA under an Emergency Use Authorization (EUA). This EUA will remain  in effect (meaning this test can be used) for the duration of the COVID-19 declaration under Section 564(b)(1) of the Act,  21 U.S.C.section 360bbb-3(b)(1), unless the authorization is terminated  or revoked sooner.       Influenza A by PCR NEGATIVE NEGATIVE Final   Influenza B by PCR NEGATIVE NEGATIVE Final    Comment: (NOTE) The Xpert Xpress SARS-CoV-2/FLU/RSV plus assay is intended as an aid in the diagnosis of influenza from Nasopharyngeal swab specimens and should not be used as a sole basis for treatment. Nasal washings and aspirates are unacceptable for Xpert Xpress SARS-CoV-2/FLU/RSV testing.  Fact Sheet for Patients: BloggerCourse.com  Fact Sheet for Healthcare Providers: SeriousBroker.it  This test is not yet approved or cleared by the [place] FDA and has been authorized for detection and/or diagnosis of SARS-CoV-2 by FDA under an Emergency Use Authorization (EUA). This EUA will remain in effect (meaning this test can be used) for the duration of the COVID-19 declaration under Section 564(b)(1) of the Act, 21 U.S.C. section 360bbb-3(b)(1), unless the authorization is terminated or revoked.  Performed at [facility], [address] [place], [place] [address]          Radiology Studies: US RENAL  Result Date: 03/28/2021 CLINICAL DATA:  Acute kidney injury. EXAM: RENAL / URINARY TRACT ULTRASOUND COMPLETE COMPARISON:  None. FINDINGS: Right Kidney: Renal measurements: 11.2 x 5.9 x 6.5 cm = volume: 224 mL. 1.2 cm cyst in the upper pole of the right kidney. No other focal lesions. Normal parenchymal thickness and echotexture. No hydronephrosis. Left Kidney: Renal measurements: 12.1 x 5.7 x 5.8 cm = volume: 212 mL. Echogenicity within normal limits. No mass or hydronephrosis visualized. Bladder: Appears normal for degree of bladder distention. Other: None. IMPRESSION: Small cyst in the upper pole of the right kidney. Otherwise normal ultrasound appearance of the kidneys and bladder. Electronically Signed   By: Burman Nieves  M.D.   On: 03/28/2021 21:04    Scheduled Meds: . amiodarone  200 mg Oral Daily  . apixaban  5 mg Oral BID  . aspirin EC  81 mg Oral Daily  . FLUoxetine  20 mg Oral Daily  . hydrALAZINE  100 mg Oral TID   Continuous Infusions: . lactated ringers 150 mL/hr at 03/28/21 2158    LOS: 0 days   Time spent:  Azucena Fallen, DO [facility]  If 7PM-7AM, please contact night-coverage [email or web address]  03/29/2021, 7:10 AM

## 2021-03-30 DIAGNOSIS — I480 Paroxysmal atrial fibrillation: Secondary | ICD-10-CM

## 2021-03-30 DIAGNOSIS — E538 Deficiency of other specified B group vitamins: Secondary | ICD-10-CM

## 2021-03-30 DIAGNOSIS — I10 Essential (primary) hypertension: Secondary | ICD-10-CM

## 2021-03-30 DIAGNOSIS — F418 Other specified anxiety disorders: Secondary | ICD-10-CM

## 2021-03-30 LAB — BASIC METABOLIC PANEL
Anion gap: 11 (ref 5–15)
BUN: 39 mg/dL — ABNORMAL HIGH (ref 6–20)
CO2: 27 mmol/L (ref 22–32)
Calcium: 9.1 mg/dL (ref 8.9–10.3)
Chloride: 98 mmol/L (ref 98–111)
Creatinine, Ser: 1.67 mg/dL — ABNORMAL HIGH (ref 0.61–1.24)
GFR, Estimated: 48 mL/min — ABNORMAL LOW (ref >60)
Glucose, Bld: 102 mg/dL — ABNORMAL HIGH (ref 70–99)
Potassium: 3.6 mmol/L (ref 3.5–5.1)
Sodium: 136 mmol/L (ref 135–145)

## 2021-03-30 LAB — CBC
HCT: 30.2 % — ABNORMAL LOW (ref 39.0–52.0)
Hemoglobin: 10.3 g/dL — ABNORMAL LOW (ref 13.0–17.0)
MCH: 33.7 pg (ref 26.0–34.0)
MCHC: 34.1 g/dL (ref 30.0–36.0)
MCV: 98.7 fL (ref 80.0–100.0)
Platelets: 180 10*3/uL (ref 150–400)
RBC: 3.06 MIL/uL — ABNORMAL LOW (ref 4.22–5.81)
RDW: 14.1 % (ref 11.5–15.5)
WBC: 5.2 10*3/uL (ref 4.0–10.5)
nRBC: 0 % (ref 0.0–0.2)

## 2021-03-30 MED ORDER — APIXABAN 5 MG PO TABS: 5.0000 mg | ORAL_TABLET | Freq: Two times a day (BID) | ORAL | 0 refills | Status: DC

## 2021-03-30 MED ORDER — AMLODIPINE BESYLATE 2.5 MG PO TABS: 2.5000 mg | ORAL_TABLET | Freq: Every day | ORAL | 0 refills | Status: DC

## 2021-03-30 MED ORDER — VITAMIN B-12 1000 MCG PO TABS: 1000.0000 ug | ORAL_TABLET | Freq: Every day | ORAL | 0 refills | Status: DC

## 2021-03-30 MED ORDER — METOPROLOL TARTRATE 25 MG PO TABS: 12.5000 mg | ORAL_TABLET | Freq: Two times a day (BID) | ORAL | 0 refills | Status: DC

## 2021-03-30 MED ORDER — AMLODIPINE BESYLATE 5 MG PO TABS
5.0000 mg | ORAL_TABLET | Freq: Every day | ORAL | Status: DC
  Administered 2021-03-30: 09:00:00 5 mg via ORAL
  Filled 2021-03-30: qty 1

## 2021-03-30 MED ORDER — CYANOCOBALAMIN 1000 MCG/ML IJ SOLN
1000.0000 ug | Freq: Once | INTRAMUSCULAR | Status: AC
  Administered 2021-03-30: 1000 ug via INTRAMUSCULAR
  Filled 2021-03-30: qty 1

## 2021-03-30 NOTE — TOC Transition Note (Signed)
Transition of Care [facility]) - CM/SW Discharge Note   Patient Details  Name: Andrew Sparks MRN: [id number] Date of Birth: [birth year]  [facility] CM/SW Contact:  Caryn Section, RN Phone Number: 03/30/2021, 9:26 AM   Clinical Narrative:   TOC in to see patient at bedside, purpose: Discharge planning.  Patient lives alone, no concerns about getting to appointments or getting medications.  Patient uses [facility], [facility] on [address].  Patient given coupon and information about Eliquis, he will speak with pharmacy regarding this.  Patient has no concerns about returning home at this time, girlfriend will transport patient home.    Final next level of care: Home/Self Care Barriers to Discharge: Barriers Resolved   Patient Goals and CMS Choice     Choice offered to / list presented to : NA  Discharge Placement                       Discharge Plan and Services   Discharge Planning Services: CM Consult            DME Arranged:  (n/a)         HH Arranged:  (n/a)          Social Determinants of Health (SDOH) Interventions     Readmission Risk Interventions No flowsheet data found.

## 2021-03-30 NOTE — Discharge Summary (Signed)
[facility] [place] [facility]   PATIENT NAME: Andrew Rankrnesto Sparks    MR#:  [id number]  DATE OF BIRTH:  November 03, 1966  DATE OF ADMISSION:  03/28/2021 ADMITTING PHYSICIAN: Azucena FallenWilliam C Lancaster, MD  DATE OF DISCHARGE: 03/30/2021 10:12 AM  PRIMARY CARE PHYSICIAN: Sherron Mondayejan-Sie, S Ahmed, MD    ADMISSION DIAGNOSIS:  Dizziness [R42] AKI (acute kidney injury) (HCC) [N17.9]  DISCHARGE DIAGNOSIS:  Principal Problem:   AKI (acute kidney injury) (HCC) Active Problems:   Moderate recurrent major depression (HCC)   Obstructive sleep apnea syndrome   Atrial fibrillation, chronic (HCC)   Depression with anxiety   Tobacco abuse   Essential hypertension   SECONDARY DIAGNOSIS:   Past Medical History:  Diagnosis Date  . A-fib (HCC)   . Anxiety   . CHF (congestive heart failure) (HCC)   . Depression   . Hypersomnia with sleep apnea   . Hypertension   . Morbid obesity (HCC)   . NSTEMI (non-ST elevated myocardial infarction) (HCC) 2005  . Obesity hypoventilation syndrome (HCC)   . Shortness of breath dyspnea   . Sleep apnea   . Stroke (HCC)   . V-tach [facility](HCC)     HOSPITAL COURSE:   1.  Acute kidney injury secondary to poor oral intake.  Creatinine 2.78 on presentation and down to 1.67 with IV fluids.  Patient feeling better.  Continue to hold chlorthalidone, lisinopril, Diovan, Lasix.  Recheck creatinine and follow-up appointment.  Previous physician Dr. Natale MilchLancaster contacted Dr. Thedore MinsSingh nephrology and his office will call patient to schedule a follow-up appointment.  Ultrasound the kidney did show small cyst in the upper pole the right kidney 2.  Essential hypertension on hydralazine, restarted lower dose metoprolol.  Added low-dose Norvasc with blood pressure coming up. 3.  Paroxysmal atrial fibrillation.  Prescription written for Eliquis 5 mg twice a day.  Continue amiodarone and low-dose metoprolol. 4.  Vitamin B12 deficiency.  Vitamin B12 167 I did give an IM B12 shot here and  give oral B12 supplementation upon going home. 5.  Keep appointment for sleep study 6.  Depression with anxiety continue fluoxetine 7.  Chronic diastolic congestive heart failure.  Continue low-dose metoprolol.  Holding Lasix at this time with acute kidney injury.  No signs of heart failure on this hospital stay.  DISCHARGE CONDITIONS:   Satisfactory  CONSULTS OBTAINED:  None  DRUG ALLERGIES:  No Known Allergies  DISCHARGE MEDICATIONS:   Allergies as of 03/30/2021   No Known Allergies     Medication List    STOP taking these medications   albuterol 108 (90 Base) MCG/ACT inhaler Commonly known as: VENTOLIN HFA   chlorthalidone 25 MG tablet Commonly known as: HYGROTON   furosemide 40 MG tablet Commonly known as: Lasix   HYDROcodone-acetaminophen 5-325 MG tablet Commonly known as: NORCO/VICODIN   lisinopril 20 MG tablet Commonly known as: ZESTRIL   valsartan 320 MG tablet Commonly known as: DIOVAN     TAKE these medications   amiodarone 200 MG tablet Commonly known as: PACERONE Take 200 mg by mouth daily.   amLODipine 2.5 MG tablet Commonly known as: NORVASC Take 1 tablet (2.5 mg total) by mouth daily. Start taking on: March 31, 2021   apixaban 5 MG Tabs tablet Commonly known as: ELIQUIS Take 1 tablet (5 mg total) by mouth 2 (two) times daily.   aspirin EC 81 MG tablet Take 81 mg by mouth daily.   FLUoxetine 20 MG capsule Commonly known as: PROZAC Take 20 mg  by mouth daily.   hydrALAZINE 100 MG tablet Commonly known as: APRESOLINE Take 100 mg by mouth 3 (three) times daily.   metoprolol tartrate 25 MG tablet Commonly known as: LOPRESSOR Take 0.5 tablets (12.5 mg total) by mouth 2 (two) times daily. What changed:  medication strength how much to take   vitamin B-12 1000 MCG tablet Commonly known as: CYANOCOBALAMIN Take 1 tablet (1,000 mcg total) by mouth daily. Start taking on: March 31, 2021        DISCHARGE INSTRUCTIONS:   Follow-up  PMD 5 days  If you experience worsening of your admission symptoms, develop shortness of breath, life threatening emergency, suicidal or homicidal thoughts you must seek medical attention immediately by calling 911 or calling your MD immediately  if symptoms less severe.  You Must read complete instructions/literature along with all the possible adverse reactions/side effects for all the Medicines you take and that have been prescribed to you. Take any new Medicines after you have completely understood and accept all the possible adverse reactions/side effects.   Please note  You were cared for by a hospitalist during your hospital stay. If you have any questions about your discharge medications or the care you received while you were in the hospital after you are discharged, you can call the unit and asked to speak with the hospitalist on call if the hospitalist that took care of you is not available. Once you are discharged, your primary care physician will handle any further medical issues. Please note that NO REFILLS for any discharge medications will be authorized once you are discharged, as it is imperative that you return to your primary care physician (or establish a relationship with a primary care physician if you do not have one) for your aftercare needs so that they can reassess your need for medications and monitor your lab values.    Today   CHIEF COMPLAINT:   Chief Complaint  Patient presents with  . Abnormal Lab    HISTORY OF PRESENT ILLNESS:  Andrew Sparks  is a 55 y.o. male sent in with worsening kidney function for   VITAL SIGNS:  Blood pressure (!) 138/102, pulse 63, temperature 98.7 F (37.1 C), resp. rate 16, height 5\' 11"  (1.803 m), weight 110 kg, SpO2 97 %.  Started Norvasc and will restart metoprolol  I/O:    Intake/Output Summary (Last 24 hours) at 03/30/2021 1657 Last data filed at 03/30/2021 1011 Gross per 24 hour  Intake 360 ml  Output --  Net 360 ml     PHYSICAL EXAMINATION:  GENERAL:  55 y.o.-year-old patient lying in the bed with no acute distress.  EYES: Pupils equal, round, reactive to light and accommodation. No scleral icterus.  HEENT: Head atraumatic, normocephalic. Oropharynx and nasopharynx clear.  LUNGS: Normal breath sounds bilaterally, no wheezing, rales,rhonchi or crepitation. No use of accessory muscles of respiration.  CARDIOVASCULAR: S1, S2 normal. No murmurs, rubs, or gallops.  ABDOMEN: Soft, non-tender, non-distended.  EXTREMITIES: No pedal edema.  NEUROLOGIC: Cranial nerves II through XII are intact. Muscle strength 5/5 in all extremities. Sensation intact. Gait not checked.  PSYCHIATRIC: The patient is alert and oriented x 3.  SKIN: No obvious rash, lesion, or ulcer.   DATA REVIEW:   CBC Recent Labs  Lab 03/30/21 0507  WBC 5.2  HGB 10.3*  HCT 30.2*  PLT 180    Chemistries  Recent Labs  Lab 03/28/21 1454 03/28/21 1815 03/30/21 0507  NA 139   < > 136  K 3.8   < > 3.6  CL 100   < > 98  CO2 28   < > 27  GLUCOSE 101*   < > 102*  BUN 64*   < > 39*  CREATININE 2.78*   < > 1.67*  CALCIUM 9.6   < > 9.1  AST 23  --   --   ALT 13  --   --   ALKPHOS 43  --   --   BILITOT 0.9  --   --    < > = values in this interval not displayed.    Microbiology Results  Results for orders placed or performed during the hospital encounter of 03/28/21  Resp Panel by RT-PCR (Flu A&B, Covid) Nasopharyngeal Swab     Status: None   Collection Time: 03/28/21  3:43 PM   Specimen: Nasopharyngeal Swab; Nasopharyngeal(NP) swabs in vial transport medium  Result Value Ref Range Status   SARS Coronavirus 2 by RT PCR NEGATIVE NEGATIVE Final    Comment: (NOTE) SARS-CoV-2 target nucleic acids are NOT DETECTED.  The SARS-CoV-2 RNA is generally detectable in upper respiratory specimens during the acute phase of infection. The lowest concentration of SARS-CoV-2 viral copies this assay can detect is 138 copies/mL. A negative  result does not preclude SARS-Cov-2 infection and should not be used as the sole basis for treatment or other patient management decisions. A negative result may occur with  improper specimen collection/handling, submission of specimen other than nasopharyngeal swab, presence of viral mutation(s) within the areas targeted by this assay, and inadequate number of viral copies(<138 copies/mL). A negative result must be combined with clinical observations, patient history, and epidemiological information. The expected result is Negative.  Fact Sheet for Patients:  BloggerCourse.com  Fact Sheet for Healthcare Providers:  SeriousBroker.it  This test is no t yet approved or cleared by the [place] FDA and  has been authorized for detection and/or diagnosis of SARS-CoV-2 by FDA under an Emergency Use Authorization (EUA). This EUA will remain  in effect (meaning this test can be used) for the duration of the COVID-19 declaration under Section 564(b)(1) of the Act, 21 U.S.C.section 360bbb-3(b)(1), unless the authorization is terminated  or revoked sooner.       Influenza A by PCR NEGATIVE NEGATIVE Final   Influenza B by PCR NEGATIVE NEGATIVE Final    Comment: (NOTE) The Xpert Xpress SARS-CoV-2/FLU/RSV plus assay is intended as an aid in the diagnosis of influenza from Nasopharyngeal swab specimens and should not be used as a sole basis for treatment. Nasal washings and aspirates are unacceptable for Xpert Xpress SARS-CoV-2/FLU/RSV testing.  Fact Sheet for Patients: BloggerCourse.com  Fact Sheet for Healthcare Providers: SeriousBroker.it  This test is not yet approved or cleared by the [place] FDA and has been authorized for detection and/or diagnosis of SARS-CoV-2 by FDA under an Emergency Use Authorization (EUA). This EUA will remain in effect (meaning this test can be used)  for the duration of the COVID-19 declaration under Section 564(b)(1) of the Act, 21 U.S.C. section 360bbb-3(b)(1), unless the authorization is terminated or revoked.  Performed at [facility], [address] [place], [place] [address]     RADIOLOGY:  US RENAL  Result Date: 03/28/2021 CLINICAL DATA:  Acute kidney injury. EXAM: RENAL / URINARY TRACT ULTRASOUND COMPLETE COMPARISON:  None. FINDINGS: Right Kidney: Renal measurements: 11.2 x 5.9 x 6.5 cm = volume: 224 mL. 1.2 cm cyst in the upper pole of the right kidney. No  other focal lesions. Normal parenchymal thickness and echotexture. No hydronephrosis. Left Kidney: Renal measurements: 12.1 x 5.7 x 5.8 cm = volume: 212 mL. Echogenicity within normal limits. No mass or hydronephrosis visualized. Bladder: Appears normal for degree of bladder distention. Other: None. IMPRESSION: Small cyst in the upper pole of the right kidney. Otherwise normal ultrasound appearance of the kidneys and bladder. Electronically Signed   By: Burman Nieves M.D.   On: 03/28/2021 21:04     Management plans discussed with the patient, and he is in agreement.  CODE STATUS:  Code Status History    Date Active Date Inactive Code Status Order ID Comments User Context   03/28/2021 1612 03/30/2021 1555 Full Code [id number]  CoxNadyne Coombes, DO ED   04/20/2020 1758 04/24/2020 1700 Full Code [id number]  Alford Highland, MD ED   04/20/2019 2318 04/24/2019 1931 Full Code [id number]  Aroor, Dara Lords, MD ED   09/09/2018 2223 09/11/2018 1624 Full Code [id number]  Ihor Austin, MD Inpatient   03/26/2018 1849 03/29/2018 1950 Full Code [id number]  Shaune Pollack, MD Inpatient   07/17/2017 1524 07/19/2017 1742 Full Code [id number]  Ihor Austin, MD Inpatient   01/17/2017 0827 01/18/2017 1909 Full Code [id number]  Arnaldo Natal, MD Inpatient   12/15/2014 1547 12/23/2014 1443 Full Code [id number]  Love, Evlyn Kanner, PA-C Inpatient   Advance Care Planning Activity    Questions for Most  Recent Historical Code Status (Order [id number])       TOTAL TIME TAKING CARE OF THIS PATIENT: 32 minutes.    Alford Highland M.D on 03/30/2021 at 4:57 PM  Between 7am to 6pm - Pager - [phone]  After 6pm go to [email or web address] - password [email or web address]  [facility]  CC: Primary care physician; Sherron Monday, MD

## 2021-03-30 NOTE — Discharge Instructions (Signed)
Acute Kidney Injury, Adult  Acute kidney injury is a sudden worsening of kidney function. The kidneys are organs that have several jobs. They filter the blood to remove waste products and extra fluid. They also maintain a healthy balance of minerals and hormones in the body, which helps control blood pressure and keep bones strong. With this condition, your kidneys do not do their jobs as well as they should. This condition ranges from mild to severe. Over time, it may develop into long-lasting (chronic) kidney disease. Early detection and treatment may prevent acute kidney injury from developing into a chronic condition. What are the causes? Common causes of this condition include:  A problem with blood flow to the kidneys. This may be caused by: ? Low blood pressure (hypotension) or shock. ? Blood loss. ? Heart and blood vessel (cardiovascular) disease. ? Severe burns. ? Liver disease.  Direct damage to the kidneys. This may be caused by: ? Certain medicines. ? A kidney infection. ? Poisoning. ? Being around or in contact with toxic substances. ? A surgical wound. ? A hard, direct hit to the kidney area.  A sudden blockage of urine flow. This may be caused by: ? Cancer. ? Kidney stones. ? An enlarged prostate in males. What increases the risk? You are more likely to develop this condition if you:  Are older than age 55.  Are male.  Are hospitalized, especially if you are in critical condition.  Have certain conditions, such as: ? Chronic kidney disease. ? Diabetes. ? Coronary artery disease and heart failure. ? Pulmonary disease. ? Chronic liver disease. What are the signs or symptoms? Symptoms of this condition may not be obvious until the condition becomes severe. Symptoms of this condition can include:  Tiredness (lethargy) or difficulty staying awake.  Nausea or vomiting.  Swelling (edema) of the face, legs, ankles, or feet.  Problems with urination, such  as: ? Pain in the abdomen, or pain along the side of your stomach (flank). ? Producing little or no urine. ? Passing urine with a weak flow.  Muscle twitches and cramps, especially in the legs.  Confusion or trouble concentrating.  Loss of appetite.  Fever. How is this diagnosed? Your health care provider can diagnose this condition based on your symptoms, medical history, and a physical exam.  You may also have other tests, such as:  Blood tests.  Urine tests.  Imaging tests.  A test in which a sample of tissue is removed from the kidneys to be examined under a microscope (kidney biopsy). How is this treated? Treatment for this condition depends on the cause and how severe the condition is. In mild cases, treatment may not be needed. The kidneys may heal on their own. In more severe cases, treatment will involve:  Treating the cause of the kidney injury. This may involve changing any medicines you are taking or adjusting your dosage.  Fluids. You may need specialized IV fluids to balance your body's needs.  Having a catheter placed to drain urine and prevent blockages.  Preventing problems from occurring. This may mean avoiding certain medicines or procedures that can cause further injury to the kidneys. In some cases, treatment may also require:  A procedure to remove toxic wastes from the body (dialysis or continuous renal replacement therapy, CRRT).  Surgery. This may be done to repair a torn kidney or to remove the blockage from the urinary system. Follow these instructions at home: Medicines  Take over-the-counter and prescription medicines only as   told by your health care provider.  Do not take any new medicines without your health care provider's approval. Many medicines can worsen your kidney damage.  Do not take any vitamin and mineral supplements without your health care provider's approval. Many nutritional supplements can worsen your kidney  damage. Lifestyle  If your health care provider prescribed changes to your diet, follow them. You may need to decrease the amount of protein you eat.  Achieve and maintain a healthy weight. If you need help with this, ask your health care provider.  Start or continue an exercise plan. Try to exercise at least 30 minutes a day, 5 days a week.  Do not use any products that contain nicotine or tobacco, such as cigarettes, e-cigarettes, and chewing tobacco. If you need help quitting, ask your health care provider.   General instructions  Keep track of your blood pressure. Report changes in your blood pressure as told by your health care provider.  Stay up to date with your vaccines. Ask your health care provider which vaccines you need.  Keep all follow-up visits as told by your health care provider. This is important.   Where to find more information  American Association of Kidney Patients: www.aakp.org  National Kidney Foundation: www.kidney.org  American Kidney Fund: www.akfinc.org  [employer] [facility]: ? www.lifeoptions.org ? www.kidneyschool.org Contact a health care provider if:  Your symptoms get worse.  You develop new symptoms. Get help right away if:  You develop symptoms of worsening kidney disease, which include: ? Headaches. ? Abnormally dark or light skin. ? Easy bruising. ? Frequent hiccups. ? Chest pain. ? Shortness of breath. ? End of menstruation in women. ? Seizures. ? Confusion or altered mental status. ? Abdominal or back pain. ? Itchiness.  You have a fever.  Your body is producing less urine.  You have pain or bleeding when you urinate. Summary  Acute kidney injury is a sudden worsening of kidney function.  Acute kidney injury can be caused by problems with blood flow to the kidneys, direct damage to the kidneys, and sudden blockage of urine flow.  Symptoms of this condition may not be obvious until it becomes severe.  Symptoms may include edema, lethargy, confusion, nausea or vomiting, and problems passing urine.  This condition can be diagnosed with blood tests, urine tests, and imaging tests. Sometimes a kidney biopsy is done to diagnose this condition.  Treatment for this condition often involves treating the underlying cause. It is treated with fluids, medicines, diet changes, dialysis, or surgery. This information is not intended to replace advice given to you by your health care provider. Make sure you discuss any questions you have with your health care provider. Document Revised: 09/30/2019 Document Reviewed: 09/30/2019 Elsevier Patient Education  2021 Elsevier Inc.  

## 2021-04-21 DIAGNOSIS — M6281 Muscle weakness (generalized): Secondary | ICD-10-CM | POA: Diagnosis not present

## 2021-04-21 DIAGNOSIS — M25642 Stiffness of left hand, not elsewhere classified: Secondary | ICD-10-CM | POA: Diagnosis not present

## 2021-04-21 DIAGNOSIS — M25542 Pain in joints of left hand: Secondary | ICD-10-CM | POA: Diagnosis not present

## 2021-05-30 DIAGNOSIS — R69 Illness, unspecified: Secondary | ICD-10-CM | POA: Diagnosis not present

## 2021-05-30 DIAGNOSIS — I160 Hypertensive urgency: Secondary | ICD-10-CM | POA: Diagnosis not present

## 2021-05-30 DIAGNOSIS — G4731 Primary central sleep apnea: Secondary | ICD-10-CM | POA: Diagnosis not present

## 2021-05-30 DIAGNOSIS — M5412 Radiculopathy, cervical region: Secondary | ICD-10-CM | POA: Diagnosis not present

## 2021-05-30 DIAGNOSIS — I509 Heart failure, unspecified: Secondary | ICD-10-CM | POA: Diagnosis not present

## 2021-05-30 DIAGNOSIS — Z930 Tracheostomy status: Secondary | ICD-10-CM | POA: Diagnosis not present

## 2021-05-30 DIAGNOSIS — I63549 Cerebral infarction due to unspecified occlusion or stenosis of unspecified cerebellar artery: Secondary | ICD-10-CM | POA: Diagnosis not present

## 2021-05-30 DIAGNOSIS — I10 Essential (primary) hypertension: Secondary | ICD-10-CM | POA: Diagnosis not present

## 2021-07-15 NOTE — Progress Notes (Deleted)
Patient ID: Andrew Sparks, male    DOB: [birth year], 55 y.o.   MRN: [id number]  HPI  Andrew Sparks is a 55 y/o male with a history of HTN, stroke, anxiety, obesity hypoventilation syndrome, sleep apnea, depression, NSTEMI, VT, atrial fibrillation, previous trach, current tobacco / alcohol use and chronic heart failure.   Echo report from 04/20/20 reviewed and showed an EF of 50-55% along with mild LAE.  Catheterization done 09/10/18 and showed: Normal coronaries, RCA had anterior take off and was non selective and normal LVEF.  Admitted 03/28/21 due to acute kidney injury due to decreased oral intake. IVF given and oral medications held. Discharged after 2 days.   He presents today for a follow-up visit with a chief complaint of   Past Medical History:  Diagnosis Date   A-fib (HCC)    Anxiety    CHF (congestive heart failure) (HCC)    Depression    Hypersomnia with sleep apnea    Hypertension    Morbid obesity (HCC)    NSTEMI (non-ST elevated myocardial infarction) (HCC) 2005   Obesity hypoventilation syndrome (HCC)    Shortness of breath dyspnea    Sleep apnea    Stroke (HCC)    V-tach [facility])    Past Surgical History:  Procedure Laterality Date   LEFT HEART CATH AND CORONARY ANGIOGRAPHY Right 09/10/2018   Procedure: LEFT HEART CATH AND CORONARY ANGIOGRAPHY with possible percutaneous intervention;  Surgeon: Laurier Nancy, MD;  Location: [facility] INVASIVE CV LAB;  Service: Cardiovascular;  Laterality: Right;   TRACHEOSTOMY     Family History  Problem Relation Age of Onset   Heart disease Father    Diabetes type II Mother    Breast cancer Mother    Social History   Tobacco Use   Smoking status: Every Day    Packs/day: 0.25    Years: 28.00    Pack years: 7.00    Types: Cigarettes   Smokeless tobacco: Never  Substance Use Topics   Alcohol use: Yes    Alcohol/week: 4.0 standard drinks    Types: 4 Cans of beer per week   No Known Allergies    Review of Systems   Constitutional:  Positive for fatigue. Negative for appetite change.  HENT:  Positive for congestion. Negative for postnasal drip and sore throat.   Eyes: Negative.   Respiratory:  Positive for shortness of breath (with moderate exertion). Negative for cough and wheezing.   Cardiovascular:  Negative for chest pain, palpitations and leg swelling.  Gastrointestinal:  Negative for abdominal distention and abdominal pain.  Endocrine: Negative.   Genitourinary: Negative.   Musculoskeletal:  Positive for arthralgias (leg pain). Negative for back pain.  Skin: Negative.   Allergic/Immunologic: Negative.   Neurological:  Negative for dizziness and light-headedness.  Hematological:  Negative for adenopathy. Does not bruise/bleed easily.  Psychiatric/Behavioral:  Positive for sleep disturbance (sleeping on 1 pillow). Negative for dysphoric mood. The patient is not nervous/anxious.     Physical Exam Vitals and nursing note reviewed.  Constitutional:      Appearance: Normal appearance.  HENT:     Head: Normocephalic and atraumatic.  Cardiovascular:     Rate and Rhythm: Normal rate. Rhythm irregular.  Pulmonary:     Effort: Pulmonary effort is normal. No respiratory distress.     Breath sounds: No wheezing or rales.  Abdominal:     General: There is no distension.     Palpations: Abdomen is soft.  Musculoskeletal:  General: No tenderness.     Cervical back: Normal range of motion and neck supple.     Right lower leg: Edema (trace pitting) present.     Left lower leg: Edema (trace pitting) present.  Skin:    General: Skin is warm and dry.  Neurological:     General: No focal deficit present.     Mental Status: He is alert and oriented to person, place, and time.  Psychiatric:        Mood and Affect: Mood normal.        Behavior: Behavior normal.        Thought Content: Thought content normal.   Assessment & Plan:  1: Chronic heart failure with preserved ejection fraction with  mild structural changes- - NYHA class II - euvolemic today - weighing daily; reminded to call for an overnight weight gain of >2 pounds or a weekly weight gain of >5 pounds - weight 242.2 pounds from last visit here 1 year ago - not adding salt and says that he rinses his canned foods off 2-3 times before using; does try to use fresh vegetables when possible - saw cardiology Welton Flakes) 12/24/20 - BNP 05/12/20 was 390.4   2: HTN- - BP - saw PCP Valrie Hart) 03/15/21 - BMP 03/30/21 reviewed and showed sodium 136, potassium 3.6, creatinine 1.67 and GFR 48  3: COPD- - continues to smoke 4-5 cigarettes daily - wears oxygen at 3L at bedtime (does not have CPAP)  4: Atrial fibrillation- - has been off eliquis again because he lost his insurance and is unable to afford it; 1 month samples given to him - [facility] brochure given to him and he says that he will stop over there after leaving here; secure message also sent to them - has already had previous stroke  5: Alcohol use- - drinks one twenty-ounce cans of beer every other day - cessation discussed for 3 minutes with him  6: Lymphedema- - stage 2 - quite active and does a lot of walking - encouraged him to elevate his legs when sitting for long periods of time as well as get compression socks to put on every morning with removal at bedtime - consider lymphapress compression boots if edema persists   Patient did not bring his medications nor a list. Each medication was verbally reviewed with the patient and he was encouraged to bring the bottles to every visit to confirm accuracy of list.

## 2021-07-18 ENCOUNTER — Telehealth: Payer: Self-pay | Admitting: Family

## 2021-07-18 ENCOUNTER — Ambulatory Visit: Payer: Medicare HMO | Admitting: Family

## 2021-07-18 DIAGNOSIS — I509 Heart failure, unspecified: Secondary | ICD-10-CM | POA: Diagnosis not present

## 2021-07-18 DIAGNOSIS — Z930 Tracheostomy status: Secondary | ICD-10-CM | POA: Diagnosis not present

## 2021-07-18 DIAGNOSIS — I63549 Cerebral infarction due to unspecified occlusion or stenosis of unspecified cerebellar artery: Secondary | ICD-10-CM | POA: Diagnosis not present

## 2021-07-18 DIAGNOSIS — N343 Urethral syndrome, unspecified: Secondary | ICD-10-CM | POA: Diagnosis not present

## 2021-07-18 DIAGNOSIS — G4731 Primary central sleep apnea: Secondary | ICD-10-CM | POA: Diagnosis not present

## 2021-07-18 DIAGNOSIS — R69 Illness, unspecified: Secondary | ICD-10-CM | POA: Diagnosis not present

## 2021-07-18 DIAGNOSIS — M5412 Radiculopathy, cervical region: Secondary | ICD-10-CM | POA: Diagnosis not present

## 2021-07-18 DIAGNOSIS — I10 Essential (primary) hypertension: Secondary | ICD-10-CM | POA: Diagnosis not present

## 2021-07-18 NOTE — Telephone Encounter (Signed)
Unable to reach patient in attempt to reschedule his missed appointment from this [facility]. We also have [facility] patient assistance paperwork we need patient to come in and sign.   Ciaira Natividad, NT

## 2021-08-30 DIAGNOSIS — E559 Vitamin D deficiency, unspecified: Secondary | ICD-10-CM | POA: Diagnosis not present

## 2021-08-30 DIAGNOSIS — M5412 Radiculopathy, cervical region: Secondary | ICD-10-CM | POA: Diagnosis not present

## 2021-08-30 DIAGNOSIS — N343 Urethral syndrome, unspecified: Secondary | ICD-10-CM | POA: Diagnosis not present

## 2021-08-30 DIAGNOSIS — R69 Illness, unspecified: Secondary | ICD-10-CM | POA: Diagnosis not present

## 2021-08-30 DIAGNOSIS — I509 Heart failure, unspecified: Secondary | ICD-10-CM | POA: Diagnosis not present

## 2021-08-30 DIAGNOSIS — G4731 Primary central sleep apnea: Secondary | ICD-10-CM | POA: Diagnosis not present

## 2021-08-30 DIAGNOSIS — I63549 Cerebral infarction due to unspecified occlusion or stenosis of unspecified cerebellar artery: Secondary | ICD-10-CM | POA: Diagnosis not present

## 2021-08-30 DIAGNOSIS — R7989 Other specified abnormal findings of blood chemistry: Secondary | ICD-10-CM | POA: Diagnosis not present

## 2021-08-30 DIAGNOSIS — J40 Bronchitis, not specified as acute or chronic: Secondary | ICD-10-CM | POA: Diagnosis not present

## 2021-08-30 DIAGNOSIS — I10 Essential (primary) hypertension: Secondary | ICD-10-CM | POA: Diagnosis not present

## 2022-01-01 ENCOUNTER — Emergency Department: Payer: Medicare HMO

## 2022-01-01 ENCOUNTER — Encounter: Payer: Self-pay | Admitting: Emergency Medicine

## 2022-01-01 ENCOUNTER — Emergency Department
Admission: EM | Admit: 2022-01-01 | Discharge: 2022-01-01 | Disposition: A | Discharge status: Home / Self Care | Payer: Medicare HMO | Attending: Emergency Medicine | Admitting: Emergency Medicine

## 2022-01-01 ENCOUNTER — Other Ambulatory Visit: Payer: Self-pay

## 2022-01-01 DIAGNOSIS — T2020XA Burn of second degree of head, face, and neck, unspecified site, initial encounter: Secondary | ICD-10-CM | POA: Diagnosis not present

## 2022-01-01 DIAGNOSIS — I110 Hypertensive heart disease with heart failure: Secondary | ICD-10-CM | POA: Insufficient documentation

## 2022-01-01 DIAGNOSIS — I10 Essential (primary) hypertension: Secondary | ICD-10-CM

## 2022-01-01 DIAGNOSIS — I4891 Unspecified atrial fibrillation: Secondary | ICD-10-CM | POA: Insufficient documentation

## 2022-01-01 DIAGNOSIS — I509 Heart failure, unspecified: Secondary | ICD-10-CM | POA: Diagnosis not present

## 2022-01-01 DIAGNOSIS — Z7982 Long term (current) use of aspirin: Secondary | ICD-10-CM | POA: Diagnosis not present

## 2022-01-01 DIAGNOSIS — X088XXA Exposure to other specified smoke, fire and flames, initial encounter: Secondary | ICD-10-CM | POA: Diagnosis not present

## 2022-01-01 DIAGNOSIS — R69 Illness, unspecified: Secondary | ICD-10-CM | POA: Diagnosis not present

## 2022-01-01 DIAGNOSIS — R42 Dizziness and giddiness: Secondary | ICD-10-CM

## 2022-01-01 DIAGNOSIS — Z79899 Other long term (current) drug therapy: Secondary | ICD-10-CM | POA: Diagnosis not present

## 2022-01-01 DIAGNOSIS — F17210 Nicotine dependence, cigarettes, uncomplicated: Secondary | ICD-10-CM | POA: Insufficient documentation

## 2022-01-01 DIAGNOSIS — R0602 Shortness of breath: Secondary | ICD-10-CM | POA: Diagnosis not present

## 2022-01-01 DIAGNOSIS — I517 Cardiomegaly: Secondary | ICD-10-CM | POA: Diagnosis not present

## 2022-01-01 LAB — BASIC METABOLIC PANEL WITH GFR
Anion gap: 17 — ABNORMAL HIGH (ref 5–15)
Anion gap: 16 — ABNORMAL HIGH (ref 5–15)
BUN: 10 mg/dL (ref 6–20)
BUN: 11 mg/dL (ref 6–20)
CO2: 25 mmol/L (ref 22–32)
CO2: 26 mmol/L (ref 22–32)
Calcium: 9.7 mg/dL (ref 8.9–10.3)
Calcium: 9.6 mg/dL (ref 8.9–10.3)
Chloride: 81 mmol/L — ABNORMAL LOW (ref 98–111)
Chloride: 82 mmol/L — ABNORMAL LOW (ref 98–111)
Creatinine, Ser: 1.01 mg/dL (ref 0.61–1.24)
Creatinine, Ser: 0.96 mg/dL (ref 0.61–1.24)
GFR, Estimated: >60 mL/min (ref >60)
GFR, Estimated: >60 mL/min (ref >60)
Glucose, Bld: 98 mg/dL (ref 70–99)
Glucose, Bld: 93 mg/dL (ref 70–99)
Potassium: 3.7 mmol/L (ref 3.5–5.1)
Potassium: 3.8 mmol/L (ref 3.5–5.1)
Sodium: 123 mmol/L — ABNORMAL LOW (ref 135–145)
Sodium: 124 mmol/L — ABNORMAL LOW (ref 135–145)

## 2022-01-01 LAB — CBC
HCT: 40.1 % (ref 39.0–52.0)
Hemoglobin: 13.9 g/dL (ref 13.0–17.0)
MCH: 31.4 pg (ref 26.0–34.0)
MCHC: 34.7 g/dL (ref 30.0–36.0)
MCV: 90.7 fL (ref 80.0–100.0)
Platelets: 138 10*3/uL — ABNORMAL LOW (ref 150–400)
RBC: 4.42 MIL/uL (ref 4.22–5.81)
RDW: 13.2 % (ref 11.5–15.5)
WBC: 3.5 10*3/uL — ABNORMAL LOW (ref 4.0–10.5)
nRBC: 0 % (ref 0.0–0.2)

## 2022-01-01 LAB — TROPONIN I (HIGH SENSITIVITY)
Troponin I (High Sensitivity): 17 ng/L (ref <18)
Troponin I (High Sensitivity): 22 ng/L — ABNORMAL HIGH (ref <18)

## 2022-01-01 MED ORDER — AMIODARONE HCL 200 MG PO TABS
200.0000 mg | ORAL_TABLET | ORAL | Status: AC
  Administered 2022-01-01: 200 mg via ORAL
  Filled 2022-01-01: qty 1

## 2022-01-01 MED ORDER — FLUOXETINE HCL 20 MG PO CAPS
20.0000 mg | ORAL_CAPSULE | ORAL | Status: AC
  Administered 2022-01-01: 20 mg via ORAL
  Filled 2022-01-01: qty 1

## 2022-01-01 MED ORDER — ASPIRIN EC 81 MG PO TBEC: 81.0000 mg | DELAYED_RELEASE_TABLET | Freq: Every day | ORAL | 0 refills | Status: AC

## 2022-01-01 MED ORDER — FLUOXETINE HCL 40 MG PO CAPS: 40.0000 mg | ORAL_CAPSULE | Freq: Every day | ORAL | 0 refills | Status: DC

## 2022-01-01 MED ORDER — ASPIRIN EC 81 MG PO TBEC
81.0000 mg | DELAYED_RELEASE_TABLET | ORAL | Status: AC
  Administered 2022-01-01: 81 mg via ORAL
  Filled 2022-01-01: qty 1

## 2022-01-01 MED ORDER — HYDRALAZINE HCL 100 MG PO TABS: 100.0000 mg | ORAL_TABLET | Freq: Three times a day (TID) | ORAL | 0 refills | Status: DC

## 2022-01-01 MED ORDER — AMLODIPINE BESYLATE 2.5 MG PO TABS: 2.5000 mg | ORAL_TABLET | Freq: Every day | ORAL | 0 refills | Status: DC

## 2022-01-01 MED ORDER — METOPROLOL TARTRATE 25 MG PO TABS
12.5000 mg | ORAL_TABLET | ORAL | Status: AC
  Administered 2022-01-01: 12.5 mg via ORAL
  Filled 2022-01-01: qty 1

## 2022-01-01 MED ORDER — AMLODIPINE BESYLATE 5 MG PO TABS
2.5000 mg | ORAL_TABLET | ORAL | Status: AC
  Administered 2022-01-01: 2.5 mg via ORAL
  Filled 2022-01-01: qty 1

## 2022-01-01 MED ORDER — CHLORTHALIDONE 25 MG PO TABS: 25.0000 mg | ORAL_TABLET | Freq: Every day | ORAL | 0 refills | Status: DC

## 2022-01-01 MED ORDER — HYDRALAZINE HCL 50 MG PO TABS
100.0000 mg | ORAL_TABLET | ORAL | Status: AC
  Administered 2022-01-01: 100 mg via ORAL
  Filled 2022-01-01: qty 2

## 2022-01-01 MED ORDER — AMIODARONE HCL 200 MG PO TABS: 200.0000 mg | ORAL_TABLET | Freq: Every day | ORAL | 0 refills | Status: DC

## 2022-01-01 MED ORDER — METOPROLOL TARTRATE 50 MG PO TABS: 50.0000 mg | ORAL_TABLET | Freq: Two times a day (BID) | ORAL | 0 refills | Status: DC

## 2022-01-01 MED ORDER — APIXABAN 5 MG PO TABS
5.0000 mg | ORAL_TABLET | ORAL | Status: AC
  Administered 2022-01-01: 5 mg via ORAL
  Filled 2022-01-01: qty 1

## 2022-01-01 MED ORDER — MUPIROCIN 2 % EX OINT: 1.0000 "application " | TOPICAL_OINTMENT | Freq: Two times a day (BID) | CUTANEOUS | 0 refills | Status: DC

## 2022-01-01 MED ORDER — APIXABAN 5 MG PO TABS: 5.0000 mg | ORAL_TABLET | Freq: Two times a day (BID) | ORAL | 0 refills | Status: DC

## 2022-01-01 NOTE — ED Notes (Signed)
Patient sitting on the side of the bed as he has the entire time he has been here. RR even and slightly labored. Patient denies pain at this time. Reassess blood pressure multiple times.

## 2022-01-01 NOTE — ED Notes (Signed)
Patient continues to sit on the side of the bed. RR even and slightly labored. Patient requesting update from the provider. I have explained that we are waiting on repeat blood tests then he will be in to speak with him. I also made the patient aware that the provider is aware of his ongoing high  blood pressure. Patient states that his visitor is ready to go as soon as possible.

## 2022-01-01 NOTE — ED Notes (Signed)
Discharge instructions, follow-up and scripts reviewed with patient and family member. Patient taken out by wheelchair to the front lobby. Patient had no questions.

## 2022-01-01 NOTE — ED Notes (Signed)
After speaking with Dr. Joni Fears, the [facility] that was ordered was added to the blood drawn at 2030 for the Troponin. Lab was notified.

## 2022-01-01 NOTE — ED Triage Notes (Signed)
Pt via POV from home. Pt c/o SOB and dizziness that started today. Denies pain. Denies cough. States that he took his BP and it was 169/116. Pt has a hx of hypertension. Pt is on 3L [place] chronically. Pt is A&OX4 and NAD
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
## 2022-01-01 NOTE — ED Notes (Signed)
Patient resting on stretcher in room. RR even and slightly labored. Radiology tech at bedside to take patient to xray. Patient placed on 3lpm oxygen via [place] portable.

## 2022-01-02 DIAGNOSIS — I10 Essential (primary) hypertension: Secondary | ICD-10-CM | POA: Diagnosis not present

## 2022-01-02 DIAGNOSIS — R7989 Other specified abnormal findings of blood chemistry: Secondary | ICD-10-CM | POA: Diagnosis not present

## 2022-01-02 DIAGNOSIS — I63549 Cerebral infarction due to unspecified occlusion or stenosis of unspecified cerebellar artery: Secondary | ICD-10-CM | POA: Diagnosis not present

## 2022-01-04 DIAGNOSIS — T2022XD Burn of second degree of lip(s), subsequent encounter: Secondary | ICD-10-CM | POA: Diagnosis not present

## 2022-01-04 DIAGNOSIS — R7989 Other specified abnormal findings of blood chemistry: Secondary | ICD-10-CM | POA: Diagnosis not present

## 2022-01-04 DIAGNOSIS — I509 Heart failure, unspecified: Secondary | ICD-10-CM | POA: Diagnosis not present

## 2022-01-04 DIAGNOSIS — R69 Illness, unspecified: Secondary | ICD-10-CM | POA: Diagnosis not present

## 2022-01-04 DIAGNOSIS — I63549 Cerebral infarction due to unspecified occlusion or stenosis of unspecified cerebellar artery: Secondary | ICD-10-CM | POA: Diagnosis not present

## 2022-01-04 DIAGNOSIS — T2024XD Burn of second degree of nose (septum), subsequent encounter: Secondary | ICD-10-CM | POA: Diagnosis not present

## 2022-01-04 DIAGNOSIS — G4731 Primary central sleep apnea: Secondary | ICD-10-CM | POA: Diagnosis not present

## 2022-01-04 DIAGNOSIS — K739 Chronic hepatitis, unspecified: Secondary | ICD-10-CM | POA: Diagnosis not present

## 2022-01-04 DIAGNOSIS — M5412 Radiculopathy, cervical region: Secondary | ICD-10-CM | POA: Diagnosis not present

## 2022-01-04 DIAGNOSIS — I10 Essential (primary) hypertension: Secondary | ICD-10-CM | POA: Diagnosis not present

## 2022-01-30 DIAGNOSIS — K739 Chronic hepatitis, unspecified: Secondary | ICD-10-CM | POA: Diagnosis not present

## 2022-02-20 DIAGNOSIS — G4731 Primary central sleep apnea: Secondary | ICD-10-CM | POA: Diagnosis not present

## 2022-02-20 DIAGNOSIS — M5412 Radiculopathy, cervical region: Secondary | ICD-10-CM | POA: Diagnosis not present

## 2022-02-20 DIAGNOSIS — I10 Essential (primary) hypertension: Secondary | ICD-10-CM | POA: Diagnosis not present

## 2022-02-20 DIAGNOSIS — T2024XD Burn of second degree of nose (septum), subsequent encounter: Secondary | ICD-10-CM | POA: Diagnosis not present

## 2022-02-20 DIAGNOSIS — I63549 Cerebral infarction due to unspecified occlusion or stenosis of unspecified cerebellar artery: Secondary | ICD-10-CM | POA: Diagnosis not present

## 2022-02-20 DIAGNOSIS — I951 Orthostatic hypotension: Secondary | ICD-10-CM | POA: Diagnosis not present

## 2022-02-20 DIAGNOSIS — T2022XD Burn of second degree of lip(s), subsequent encounter: Secondary | ICD-10-CM | POA: Diagnosis not present

## 2022-02-20 DIAGNOSIS — K739 Chronic hepatitis, unspecified: Secondary | ICD-10-CM | POA: Diagnosis not present

## 2022-02-20 DIAGNOSIS — R7989 Other specified abnormal findings of blood chemistry: Secondary | ICD-10-CM | POA: Diagnosis not present

## 2022-02-20 DIAGNOSIS — R69 Illness, unspecified: Secondary | ICD-10-CM | POA: Diagnosis not present

## 2022-02-21 ENCOUNTER — Other Ambulatory Visit: Payer: Self-pay

## 2022-02-21 ENCOUNTER — Encounter: Payer: Self-pay | Admitting: Emergency Medicine

## 2022-02-21 ENCOUNTER — Emergency Department
Admission: EM | Admit: 2022-02-21 | Discharge: 2022-02-21 | Disposition: A | Discharge status: Home / Self Care | Payer: Medicare HMO | Attending: Emergency Medicine | Admitting: Emergency Medicine

## 2022-02-21 DIAGNOSIS — E871 Hypo-osmolality and hyponatremia: Secondary | ICD-10-CM | POA: Diagnosis not present

## 2022-02-21 DIAGNOSIS — R202 Paresthesia of skin: Secondary | ICD-10-CM | POA: Insufficient documentation

## 2022-02-21 DIAGNOSIS — R200 Anesthesia of skin: Secondary | ICD-10-CM | POA: Diagnosis not present

## 2022-02-21 DIAGNOSIS — E860 Dehydration: Secondary | ICD-10-CM | POA: Insufficient documentation

## 2022-02-21 DIAGNOSIS — I110 Hypertensive heart disease with heart failure: Secondary | ICD-10-CM | POA: Insufficient documentation

## 2022-02-21 DIAGNOSIS — I509 Heart failure, unspecified: Secondary | ICD-10-CM | POA: Insufficient documentation

## 2022-02-21 LAB — CBC WITH DIFFERENTIAL/PLATELET
Abs Immature Granulocytes: 0.02 10*3/uL (ref 0.00–0.07)
Basophils Absolute: 0.1 10*3/uL (ref 0.0–0.1)
Basophils Relative: 2 %
Eosinophils Absolute: 0.4 10*3/uL (ref 0.0–0.5)
Eosinophils Relative: 12 %
HCT: 38.9 % — ABNORMAL LOW (ref 39.0–52.0)
Hemoglobin: 13.2 g/dL (ref 13.0–17.0)
Immature Granulocytes: 1 %
Lymphocytes Relative: 22 %
Lymphs Abs: 0.8 10*3/uL (ref 0.7–4.0)
MCH: 30.4 pg (ref 26.0–34.0)
MCHC: 33.9 g/dL (ref 30.0–36.0)
MCV: 89.6 fL (ref 80.0–100.0)
Monocytes Absolute: 0.7 10*3/uL (ref 0.1–1.0)
Monocytes Relative: 21 %
Neutro Abs: 1.5 10*3/uL — ABNORMAL LOW (ref 1.7–7.7)
Neutrophils Relative %: 42 %
Platelets: 232 10*3/uL (ref 150–400)
RBC: 4.34 MIL/uL (ref 4.22–5.81)
RDW: 14.5 % (ref 11.5–15.5)
WBC: 3.5 10*3/uL — ABNORMAL LOW (ref 4.0–10.5)
nRBC: 0 % (ref 0.0–0.2)

## 2022-02-21 LAB — BASIC METABOLIC PANEL
Anion gap: 15 (ref 5–15)
BUN: 28 mg/dL — ABNORMAL HIGH (ref 6–20)
CO2: 30 mmol/L (ref 22–32)
Calcium: 9.9 mg/dL (ref 8.9–10.3)
Chloride: 77 mmol/L — ABNORMAL LOW (ref 98–111)
Creatinine, Ser: 1.4 mg/dL — ABNORMAL HIGH (ref 0.61–1.24)
GFR, Estimated: 59 mL/min — ABNORMAL LOW (ref >60)
Glucose, Bld: 89 mg/dL (ref 70–99)
Potassium: 3.9 mmol/L (ref 3.5–5.1)
Sodium: 122 mmol/L — ABNORMAL LOW (ref 135–145)

## 2022-02-21 MED ORDER — SODIUM CHLORIDE 0.9 % IV BOLUS
1000.0000 mL | Freq: Once | INTRAVENOUS | Status: AC
  Administered 2022-02-21: 1000 mL via INTRAVENOUS

## 2022-02-21 MED ORDER — SODIUM CHLORIDE 0.9 % IV BOLUS
500.0000 mL | Freq: Once | INTRAVENOUS | Status: AC
  Administered 2022-02-21: 500 mL via INTRAVENOUS

## 2022-02-21 NOTE — ED Triage Notes (Signed)
First Nurse Note: Pt to ED via POV with c/o being sent by his PMD to "get fluids, they told me that I am dehydrated". NAD.Skin color WNL ?

## 2022-02-21 NOTE — ED Provider Notes (Signed)
? ?[facility] ?Provider Note ? ? ? Event Date/Time  ? First MD Initiated Contact with Patient 02/21/22 1113   ?  (approximate) ? ? ?History  ? ?Dehydration ? ? ?HPI ? ?Andrew Sparks is a 56 y.o. male with history of hypertension, CHF, hyponatremia, atrial fibrillation who presents stating he was referred from his primary care provider after having labs done yesterday.  He was told he was dehydrated and that he needed fluids.  In addition the patient and family member report that he has had increased numbness and tingling to both feet over the last week and has had several falls due to this.  He denies any weakness.  He did fall in the shower chair yesterday and hit his head on the back of the toe but has no headache or swelling.  He denies any lightheadedness, vomiting, increased difficulty breathing, fever, or other acute symptoms. ? ? ? ?Physical Exam  ? ?Triage Vital Signs: ?ED Triage Vitals  ?Enc Vitals Group  ?   BP 02/21/22 1019 (!) 144/10  ?   Pulse Rate 02/21/22 1019 86  ?   Resp 02/21/22 1019 20  ?   Temp 02/21/22 1019 97.6 ?F (36.4 ?C)  ?   Temp Source 02/21/22 1019 Oral  ?   SpO2 02/21/22 1019 100 %  ?   Weight 02/21/22 1021 260 lb (117.9 kg)  ?   Height 02/21/22 1021 5\' 7"  (1.702 m)  ?   Head Circumference --   ?   Peak Flow --   ?   Pain Score 02/21/22 1021 0  ?   Pain Loc --   ?   Pain Edu? --   ?   Excl. in GC? --   ? ? ?Most recent vital signs: ?Vitals:  ? 02/21/22 1229 02/21/22 1245  ?BP: (!) 134/94   ?Pulse: 64 63  ?Resp: 16   ?Temp: 97.6 ?F (36.4 ?C)   ?SpO2: 100% 100%  ? ? ? ?General: Alert, oriented x3, no distress. ?CV:  Good peripheral perfusion.  ?Resp:  Normal effort.  Lungs CTAB. ?Abd:  No distention.  ?Other:  Mucous membranes slightly dry.  No peripheral edema.  Motor intact in all extremities.  5/5 motor strength and intact sensation to bilateral feet.  Normal gait. ? ? ?ED Results / Procedures / Treatments  ? ?Labs ?(all labs ordered are listed, but only  abnormal results are displayed) ?Labs Reviewed  ?CBC WITH DIFFERENTIAL/PLATELET - Abnormal; Notable for the following components:  ?    Result Value  ? WBC 3.5 (*)   ? HCT 38.9 (*)   ? Neutro Abs 1.5 (*)   ? All other components within normal limits  ?BASIC METABOLIC PANEL - Abnormal; Notable for the following components:  ? Sodium 122 (*)   ? Chloride 77 (*)   ? BUN 28 (*)   ? Creatinine, Ser 1.40 (*)   ? GFR, Estimated 59 (*)   ? All other components within normal limits  ?URINALYSIS, ROUTINE W REFLEX MICROSCOPIC  ? ? ? ?EKG ? ? ? ? ?RADIOLOGY ? ? ? ?PROCEDURES: ? ?Critical Care performed: No ? ?Procedures ? ? ?MEDICATIONS ORDERED IN ED: ?Medications  ?sodium chloride 0.9 % bolus 500 mL (0 mLs Intravenous Stopped 02/21/22 1216)  ?sodium chloride 0.9 % bolus 1,000 mL (1,000 mLs Intravenous New Bag/Given 02/21/22 1229)  ? ? ? ?IMPRESSION / MDM / ASSESSMENT AND PLAN / ED COURSE  ?I reviewed the triage vital  signs and the nursing notes. ? ?56 year old male with PMH as noted above presents after he was referred from his primary care provider for dehydration and abnormal labs yesterday although the patient does not know specifically what lab test was abnormal.  He has had some increased paresthesias to his feet and several falls recently but denies dizziness or weakness.  He states he is somewhat forgetful, but is not confused or altered from baseline. ? ?Exam is overall unremarkable.  The patient has a nonfocal neurologic exam and normal gait.  Mucous membranes are slightly dry.  There is no peripheral edema. ? ?I reviewed the past medical records.  I do not have access to records from [facility] where the patient was seen yesterday.  Lab work-up from late January shows hyponatremia at that time. ? ?We will obtain lab work-up for further evaluation and to determine what abnormality the patient was sent in for although I suspect he may be hyponatremic again.  This could also explain his worsening paresthesias.   Differential also includes hyperglycemia/diabetes, AKI or other other metabolic disturbance.  I do not suspect cardiac etiology.  I will start fluids and reassess after labs are obtained. ? ?----------------------------------------- ?1:39 PM on 02/21/2022 ?----------------------------------------- ? ?Lab work-up is significant for hyponatremia, although it is only slightly worse than recent prior labs.  Creatinine is slightly elevated from his baseline.  Chloride is also low.  There is no leukocytosis and hemoglobin is normal. ? ?Overall I suspect that the patient's symptoms are related to his hyponatremia.  I did consider whether the patient may require admission, however on reassessment after fluids he states he is feeling well, and appears comfortable.  His vital signs have been stable.  Neurologic exam is nonfocal and his gait is normal.  Therefore, there is no requirement at this time for inpatient treatment.  The patient himself would strongly prefer to go home. ? ?I counseled the patient and his family member on the results of the work-up.  I gave him thorough return precautions and he expressed understanding.  I instructed him to follow-up with his PMD within the next week. ? ? ?FINAL CLINICAL IMPRESSION(S) / ED DIAGNOSES  ? ?Final diagnoses:  ?Hyponatremia  ?Dehydration  ? ? ? ?Rx / DC Orders  ? ?ED Discharge Orders   ? ? None  ? ?  ? ? ? ?Note:  This document was prepared using Dragon voice recognition software and may include unintentional dictation errors.  ?  Dionne Bucy, MD ?02/21/22 1340 ? ?

## 2022-02-21 NOTE — Discharge Instructions (Signed)
Your sodium level is lower than normal.  This should improve with the fluids given today.  Make an appointment to follow-up with your regular doctor within the next week to have your blood rechecked. ? ?Return to the ER for new, worsening, or persistent severe weakness, recurrent falls, dizziness, difficulty walking, worsening numbness or tingling in the legs, difficulty walking, or any other new or worsening symptoms that concern you. ?

## 2022-02-21 NOTE — ED Notes (Addendum)
Pt's family at bedside states he had fall a week ago; denies hit his head during this fall; pt reports slipped in shower and fell into his shower chair yesterday & hit his head on back of tub; no hematoma or bleeding noted. Pt uses cane and has it at bedside with him. Pt reports lately forgetful at times. Pt's resp reg/unlabored; skin dry; calm. Pt ambulatory to room with his cane; steady. Pt remains on home 3L O2; pt confirms history of CHF; denies COPD/asthma; denies inc fluid to legs or feet or inc SOB. Pt states has forgotten to take his daily meds at times which he confirms includes BP, cardiac meds, and a diuretic.  ?

## 2022-02-21 NOTE — ED Triage Notes (Signed)
Pt states that they took his blood yesterday and MD called and told him to come here for dehydration and to get fluids.  ?

## 2022-02-21 NOTE — ED Notes (Signed)
EDP Siadecki at bedside. 

## 2022-03-20 DIAGNOSIS — I4891 Unspecified atrial fibrillation: Secondary | ICD-10-CM | POA: Diagnosis not present

## 2022-03-20 DIAGNOSIS — I509 Heart failure, unspecified: Secondary | ICD-10-CM | POA: Diagnosis not present

## 2022-03-20 DIAGNOSIS — E119 Type 2 diabetes mellitus without complications: Secondary | ICD-10-CM | POA: Diagnosis not present

## 2022-03-20 DIAGNOSIS — I10 Essential (primary) hypertension: Secondary | ICD-10-CM | POA: Diagnosis not present

## 2022-03-20 DIAGNOSIS — E782 Mixed hyperlipidemia: Secondary | ICD-10-CM | POA: Diagnosis not present

## 2022-04-25 DIAGNOSIS — R7989 Other specified abnormal findings of blood chemistry: Secondary | ICD-10-CM | POA: Diagnosis not present

## 2022-04-25 DIAGNOSIS — E559 Vitamin D deficiency, unspecified: Secondary | ICD-10-CM | POA: Diagnosis not present

## 2022-04-25 DIAGNOSIS — K739 Chronic hepatitis, unspecified: Secondary | ICD-10-CM | POA: Diagnosis not present

## 2022-04-25 DIAGNOSIS — I10 Essential (primary) hypertension: Secondary | ICD-10-CM | POA: Diagnosis not present

## 2022-04-25 DIAGNOSIS — R69 Illness, unspecified: Secondary | ICD-10-CM | POA: Diagnosis not present

## 2022-04-25 DIAGNOSIS — I509 Heart failure, unspecified: Secondary | ICD-10-CM | POA: Diagnosis not present

## 2022-04-25 DIAGNOSIS — I951 Orthostatic hypotension: Secondary | ICD-10-CM | POA: Diagnosis not present

## 2022-04-25 DIAGNOSIS — I63549 Cerebral infarction due to unspecified occlusion or stenosis of unspecified cerebellar artery: Secondary | ICD-10-CM | POA: Diagnosis not present

## 2022-04-25 DIAGNOSIS — G629 Polyneuropathy, unspecified: Secondary | ICD-10-CM | POA: Diagnosis not present

## 2022-04-25 DIAGNOSIS — G4731 Primary central sleep apnea: Secondary | ICD-10-CM | POA: Diagnosis not present

## 2022-04-25 DIAGNOSIS — M5412 Radiculopathy, cervical region: Secondary | ICD-10-CM | POA: Diagnosis not present

## 2022-05-17 DIAGNOSIS — H5203 Hypermetropia, bilateral: Secondary | ICD-10-CM | POA: Diagnosis not present

## 2022-05-17 DIAGNOSIS — H524 Presbyopia: Secondary | ICD-10-CM | POA: Diagnosis not present

## 2022-05-17 DIAGNOSIS — H52223 Regular astigmatism, bilateral: Secondary | ICD-10-CM | POA: Diagnosis not present

## 2022-06-05 DIAGNOSIS — Z0100 Encounter for examination of eyes and vision without abnormal findings: Secondary | ICD-10-CM | POA: Diagnosis not present

## 2022-06-30 ENCOUNTER — Emergency Department
Admission: EM | Admit: 2022-06-30 | Discharge: 2022-06-30 | Disposition: A | Discharge status: Other Acute Inpatient Hospital | Payer: Medicare HMO | Attending: Emergency Medicine | Admitting: Emergency Medicine

## 2022-06-30 ENCOUNTER — Other Ambulatory Visit: Payer: Self-pay

## 2022-06-30 ENCOUNTER — Encounter: Payer: Self-pay | Admitting: *Deleted

## 2022-06-30 ENCOUNTER — Inpatient Hospital Stay
Admission: AD | Admit: 2022-06-30 | Discharge: 2022-07-02 | DRG: 885 | Disposition: A | Discharge status: Other Acute Inpatient Hospital | Payer: Medicare HMO | Source: Intra-hospital | Attending: Psychiatry | Admitting: Psychiatry

## 2022-06-30 ENCOUNTER — Encounter: Payer: Self-pay | Admitting: Psychiatry

## 2022-06-30 DIAGNOSIS — Z20822 Contact with and (suspected) exposure to covid-19: Secondary | ICD-10-CM | POA: Diagnosis not present

## 2022-06-30 DIAGNOSIS — F419 Anxiety disorder, unspecified: Secondary | ICD-10-CM | POA: Diagnosis present

## 2022-06-30 DIAGNOSIS — I10 Essential (primary) hypertension: Secondary | ICD-10-CM | POA: Diagnosis present

## 2022-06-30 DIAGNOSIS — Z79899 Other long term (current) drug therapy: Secondary | ICD-10-CM | POA: Diagnosis not present

## 2022-06-30 DIAGNOSIS — R45851 Suicidal ideations: Secondary | ICD-10-CM

## 2022-06-30 DIAGNOSIS — I110 Hypertensive heart disease with heart failure: Secondary | ICD-10-CM | POA: Insufficient documentation

## 2022-06-30 DIAGNOSIS — F17210 Nicotine dependence, cigarettes, uncomplicated: Secondary | ICD-10-CM | POA: Diagnosis not present

## 2022-06-30 DIAGNOSIS — Z833 Family history of diabetes mellitus: Secondary | ICD-10-CM

## 2022-06-30 DIAGNOSIS — I509 Heart failure, unspecified: Secondary | ICD-10-CM | POA: Diagnosis not present

## 2022-06-30 DIAGNOSIS — G4700 Insomnia, unspecified: Secondary | ICD-10-CM | POA: Diagnosis not present

## 2022-06-30 DIAGNOSIS — I959 Hypotension, unspecified: Secondary | ICD-10-CM | POA: Diagnosis not present

## 2022-06-30 DIAGNOSIS — E785 Hyperlipidemia, unspecified: Secondary | ICD-10-CM | POA: Diagnosis not present

## 2022-06-30 DIAGNOSIS — F332 Major depressive disorder, recurrent severe without psychotic features: Principal | ICD-10-CM | POA: Diagnosis present

## 2022-06-30 DIAGNOSIS — I4891 Unspecified atrial fibrillation: Secondary | ICD-10-CM | POA: Insufficient documentation

## 2022-06-30 DIAGNOSIS — E662 Morbid (severe) obesity with alveolar hypoventilation: Secondary | ICD-10-CM | POA: Diagnosis not present

## 2022-06-30 DIAGNOSIS — Z930 Tracheostomy status: Secondary | ICD-10-CM | POA: Diagnosis not present

## 2022-06-30 DIAGNOSIS — Z136 Encounter for screening for cardiovascular disorders: Secondary | ICD-10-CM | POA: Diagnosis not present

## 2022-06-30 DIAGNOSIS — Z8673 Personal history of transient ischemic attack (TIA), and cerebral infarction without residual deficits: Secondary | ICD-10-CM | POA: Diagnosis not present

## 2022-06-30 DIAGNOSIS — R69 Illness, unspecified: Secondary | ICD-10-CM | POA: Diagnosis not present

## 2022-06-30 DIAGNOSIS — J449 Chronic obstructive pulmonary disease, unspecified: Secondary | ICD-10-CM | POA: Diagnosis not present

## 2022-06-30 DIAGNOSIS — I480 Paroxysmal atrial fibrillation: Secondary | ICD-10-CM | POA: Diagnosis not present

## 2022-06-30 DIAGNOSIS — G4710 Hypersomnia, unspecified: Secondary | ICD-10-CM | POA: Diagnosis not present

## 2022-06-30 DIAGNOSIS — Z8249 Family history of ischemic heart disease and other diseases of the circulatory system: Secondary | ICD-10-CM | POA: Diagnosis not present

## 2022-06-30 DIAGNOSIS — Z046 Encounter for general psychiatric examination, requested by authority: Secondary | ICD-10-CM | POA: Diagnosis present

## 2022-06-30 DIAGNOSIS — E876 Hypokalemia: Secondary | ICD-10-CM | POA: Diagnosis not present

## 2022-06-30 DIAGNOSIS — E871 Hypo-osmolality and hyponatremia: Secondary | ICD-10-CM

## 2022-06-30 DIAGNOSIS — I252 Old myocardial infarction: Secondary | ICD-10-CM

## 2022-06-30 DIAGNOSIS — F1010 Alcohol abuse, uncomplicated: Secondary | ICD-10-CM | POA: Diagnosis not present

## 2022-06-30 DIAGNOSIS — N179 Acute kidney failure, unspecified: Secondary | ICD-10-CM | POA: Diagnosis not present

## 2022-06-30 DIAGNOSIS — Z6832 Body mass index (BMI) 32.0-32.9, adult: Secondary | ICD-10-CM

## 2022-06-30 DIAGNOSIS — F322 Major depressive disorder, single episode, severe without psychotic features: Secondary | ICD-10-CM | POA: Diagnosis not present

## 2022-06-30 DIAGNOSIS — Z7901 Long term (current) use of anticoagulants: Secondary | ICD-10-CM

## 2022-06-30 LAB — MAGNESIUM: Magnesium: 1.5 mg/dL — ABNORMAL LOW (ref 1.7–2.4)

## 2022-06-30 LAB — URINE DRUG SCREEN, QUALITATIVE (ARMC ONLY)
Amphetamines, Ur Screen: NOT DETECTED
Barbiturates, Ur Screen: NOT DETECTED
Benzodiazepine, Ur Scrn: NOT DETECTED
Cannabinoid 50 Ng, Ur ~~LOC~~: NOT DETECTED
Cocaine Metabolite,Ur ~~LOC~~: NOT DETECTED
MDMA (Ecstasy)Ur Screen: NOT DETECTED
Methadone Scn, Ur: NOT DETECTED
Opiate, Ur Screen: NOT DETECTED
Phencyclidine (PCP) Ur S: NOT DETECTED
Tricyclic, Ur Screen: NOT DETECTED

## 2022-06-30 LAB — URINALYSIS, COMPLETE (UACMP) WITH MICROSCOPIC
Bilirubin Urine: NEGATIVE
Glucose, UA: NEGATIVE mg/dL
Hgb urine dipstick: NEGATIVE
Ketones, ur: NEGATIVE mg/dL
Leukocytes,Ua: NEGATIVE
Nitrite: NEGATIVE
Protein, ur: 30 mg/dL — AB
Specific Gravity, Urine: 1.01 (ref 1.005–1.030)
pH: 7 (ref 5.0–8.0)

## 2022-06-30 LAB — CBC
HCT: 36.8 % — ABNORMAL LOW (ref 39.0–52.0)
Hemoglobin: 13.2 g/dL (ref 13.0–17.0)
MCH: 31.7 pg (ref 26.0–34.0)
MCHC: 35.9 g/dL (ref 30.0–36.0)
MCV: 88.5 fL (ref 80.0–100.0)
Platelets: 176 10*3/uL (ref 150–400)
RBC: 4.16 MIL/uL — ABNORMAL LOW (ref 4.22–5.81)
RDW: 12.8 % (ref 11.5–15.5)
WBC: 4.4 10*3/uL (ref 4.0–10.5)
nRBC: 0 % (ref 0.0–0.2)

## 2022-06-30 LAB — COMPREHENSIVE METABOLIC PANEL
ALT: 22 U/L (ref 0–44)
AST: 36 U/L (ref 15–41)
Albumin: 4.3 g/dL (ref 3.5–5.0)
Alkaline Phosphatase: 74 U/L (ref 38–126)
Anion gap: 17 — ABNORMAL HIGH (ref 5–15)
BUN: 25 mg/dL — ABNORMAL HIGH (ref 6–20)
CO2: 24 mmol/L (ref 22–32)
Calcium: 9.6 mg/dL (ref 8.9–10.3)
Chloride: 80 mmol/L — ABNORMAL LOW (ref 98–111)
Creatinine, Ser: 1.54 mg/dL — ABNORMAL HIGH (ref 0.61–1.24)
GFR, Estimated: 53 mL/min — ABNORMAL LOW (ref >60)
Glucose, Bld: 99 mg/dL (ref 70–99)
Potassium: 3.2 mmol/L — ABNORMAL LOW (ref 3.5–5.1)
Sodium: 121 mmol/L — ABNORMAL LOW (ref 135–145)
Total Bilirubin: 2 mg/dL — ABNORMAL HIGH (ref 0.3–1.2)
Total Protein: 8.4 g/dL — ABNORMAL HIGH (ref 6.5–8.1)

## 2022-06-30 LAB — ETHANOL: Alcohol, Ethyl (B): <10 mg/dL (ref <10)

## 2022-06-30 LAB — ACETAMINOPHEN LEVEL: Acetaminophen (Tylenol), Serum: <10 ug/mL — ABNORMAL LOW (ref 10–30)

## 2022-06-30 LAB — SALICYLATE LEVEL: Salicylate Lvl: <7 mg/dL — ABNORMAL LOW (ref 7.0–30.0)

## 2022-06-30 LAB — PHOSPHORUS: Phosphorus: 2.9 mg/dL (ref 2.5–4.6)

## 2022-06-30 LAB — SARS CORONAVIRUS 2 BY RT PCR: SARS Coronavirus 2 by RT PCR: NEGATIVE

## 2022-06-30 MED ORDER — AMLODIPINE BESYLATE 5 MG PO TABS
2.5000 mg | ORAL_TABLET | Freq: Every day | ORAL | Status: DC
  Filled 2022-06-30: qty 1

## 2022-06-30 MED ORDER — MAGNESIUM HYDROXIDE 400 MG/5ML PO SUSP: 30.0000 mL | Freq: Every day | ORAL | Status: DC | PRN

## 2022-06-30 MED ORDER — STERILE WATER FOR INJECTION IJ SOLN
INTRAMUSCULAR | Status: AC
  Filled 2022-06-30: qty 10

## 2022-06-30 MED ORDER — FOLIC ACID 1 MG PO TABS
1.0000 mg | ORAL_TABLET | Freq: Every day | ORAL | Status: DC
  Administered 2022-07-01 – 2022-07-02 (×2): 1 mg via ORAL
  Filled 2022-06-30 (×2): qty 1

## 2022-06-30 MED ORDER — AMIODARONE HCL 200 MG PO TABS
200.0000 mg | ORAL_TABLET | Freq: Every day | ORAL | Status: DC
  Administered 2022-06-30: 200 mg via ORAL
  Filled 2022-06-30: qty 1

## 2022-06-30 MED ORDER — FOLIC ACID 1 MG PO TABS
1.0000 mg | ORAL_TABLET | Freq: Every day | ORAL | Status: DC
  Administered 2022-06-30: 1 mg via ORAL
  Filled 2022-06-30: qty 1

## 2022-06-30 MED ORDER — FLUOXETINE HCL 20 MG PO CAPS
40.0000 mg | ORAL_CAPSULE | Freq: Every day | ORAL | Status: DC
  Filled 2022-06-30: qty 2

## 2022-06-30 MED ORDER — THIAMINE HCL 100 MG PO TABS
100.0000 mg | ORAL_TABLET | Freq: Every day | ORAL | Status: DC
  Administered 2022-06-30: 100 mg via ORAL
  Filled 2022-06-30: qty 1

## 2022-06-30 MED ORDER — METOPROLOL TARTRATE 25 MG PO TABS
50.0000 mg | ORAL_TABLET | Freq: Two times a day (BID) | ORAL | Status: DC
  Administered 2022-06-30: 50 mg via ORAL
  Filled 2022-06-30: qty 2

## 2022-06-30 MED ORDER — APIXABAN 5 MG PO TABS
5.0000 mg | ORAL_TABLET | Freq: Two times a day (BID) | ORAL | Status: DC
  Administered 2022-06-30: 5 mg via ORAL
  Filled 2022-06-30: qty 1

## 2022-06-30 MED ORDER — AMIODARONE HCL 200 MG PO TABS
200.0000 mg | ORAL_TABLET | Freq: Every day | ORAL | Status: DC
  Administered 2022-07-01 – 2022-07-02 (×2): 200 mg via ORAL
  Filled 2022-06-30 (×2): qty 1

## 2022-06-30 MED ORDER — THIAMINE HCL 100 MG/ML IJ SOLN: 100.0000 mg | Freq: Every day | INTRAMUSCULAR | Status: DC

## 2022-06-30 MED ORDER — THIAMINE HCL 100 MG/ML IJ SOLN
100.0000 mg | Freq: Every day | INTRAMUSCULAR | Status: DC
  Filled 2022-06-30: qty 2

## 2022-06-30 MED ORDER — LORAZEPAM 1 MG PO TABS: 1.0000 mg | ORAL_TABLET | ORAL | Status: DC | PRN

## 2022-06-30 MED ORDER — VITAMIN B-12 1000 MCG PO TABS
1000.0000 ug | ORAL_TABLET | Freq: Every day | ORAL | Status: DC
  Administered 2022-07-01 – 2022-07-02 (×2): 1000 ug via ORAL
  Filled 2022-06-30 (×2): qty 1

## 2022-06-30 MED ORDER — ADULT MULTIVITAMIN W/MINERALS CH
1.0000 | ORAL_TABLET | Freq: Every day | ORAL | Status: DC
  Administered 2022-07-01 – 2022-07-02 (×2): 1 via ORAL
  Filled 2022-06-30 (×2): qty 1

## 2022-06-30 MED ORDER — ADULT MULTIVITAMIN W/MINERALS CH
1.0000 | ORAL_TABLET | Freq: Every day | ORAL | Status: DC
  Administered 2022-06-30: 1 via ORAL
  Filled 2022-06-30: qty 1

## 2022-06-30 MED ORDER — NICOTINE 14 MG/24HR TD PT24
14.0000 mg | MEDICATED_PATCH | Freq: Every day | TRANSDERMAL | Status: DC
  Administered 2022-06-30 – 2022-07-02 (×3): 14 mg via TRANSDERMAL
  Filled 2022-06-30 (×3): qty 1

## 2022-06-30 MED ORDER — FLUOXETINE HCL 20 MG PO CAPS
40.0000 mg | ORAL_CAPSULE | Freq: Every day | ORAL | Status: DC
  Administered 2022-07-01: 40 mg via ORAL
  Filled 2022-06-30: qty 2

## 2022-06-30 MED ORDER — APIXABAN 5 MG PO TABS
5.0000 mg | ORAL_TABLET | Freq: Two times a day (BID) | ORAL | Status: DC
  Administered 2022-06-30 – 2022-07-02 (×4): 5 mg via ORAL
  Filled 2022-06-30 (×4): qty 1

## 2022-06-30 MED ORDER — LORAZEPAM 1 MG PO TABS
1.0000 mg | ORAL_TABLET | ORAL | Status: DC | PRN
  Administered 2022-06-30: 1 mg via ORAL
  Filled 2022-06-30: qty 1

## 2022-06-30 MED ORDER — LORAZEPAM 2 MG/ML IJ SOLN: 1.0000 mg | INTRAMUSCULAR | Status: DC | PRN

## 2022-06-30 MED ORDER — AMLODIPINE BESYLATE 5 MG PO TABS
2.5000 mg | ORAL_TABLET | Freq: Every day | ORAL | Status: DC
  Administered 2022-07-01 – 2022-07-02 (×2): 2.5 mg via ORAL
  Filled 2022-06-30 (×2): qty 1

## 2022-06-30 MED ORDER — MAGNESIUM SULFATE 2 GM/50ML IV SOLN
2.0000 g | Freq: Once | INTRAVENOUS | Status: AC
  Administered 2022-06-30: 2 g via INTRAVENOUS
  Filled 2022-06-30: qty 50

## 2022-06-30 MED ORDER — POTASSIUM CHLORIDE CRYS ER 20 MEQ PO TBCR
40.0000 meq | EXTENDED_RELEASE_TABLET | Freq: Once | ORAL | Status: AC
Start: 2022-06-30 — End: 2022-06-30
  Administered 2022-06-30: 40 meq via ORAL
  Filled 2022-06-30: qty 2

## 2022-06-30 MED ORDER — VITAMIN B-12 1000 MCG PO TABS: 1000.0000 ug | ORAL_TABLET | Freq: Every day | ORAL | Status: DC

## 2022-06-30 MED ORDER — ACETAMINOPHEN 325 MG PO TABS: 650.0000 mg | ORAL_TABLET | Freq: Four times a day (QID) | ORAL | Status: DC | PRN

## 2022-06-30 MED ORDER — THIAMINE HCL 100 MG PO TABS
100.0000 mg | ORAL_TABLET | Freq: Every day | ORAL | Status: DC
  Administered 2022-07-01 – 2022-07-02 (×2): 100 mg via ORAL
  Filled 2022-06-30 (×2): qty 1

## 2022-06-30 MED ORDER — ALUM & MAG HYDROXIDE-SIMETH 200-200-20 MG/5ML PO SUSP: 30.0000 mL | ORAL | Status: DC | PRN

## 2022-06-30 MED ORDER — METOPROLOL TARTRATE 50 MG PO TABS
50.0000 mg | ORAL_TABLET | Freq: Two times a day (BID) | ORAL | Status: DC
  Administered 2022-06-30: 50 mg via ORAL
  Filled 2022-06-30: qty 1

## 2022-06-30 NOTE — BH Assessment (Signed)
2215 This writer observed wetness and then bubbles of secretion coming from around the patient's neck. 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 He stated that the trach "Just  fell out about six months ago. He reports that the stoma has not closed and that he has used oxygen three liters every night since his cardiac arrest from being in congested heart failure. The patient  has an order for oxygen set up at the bedside at [facility]. Will continue  to monitor patient for safety.   2240 This Clinical research associate spoke with the [facility] and informed the [facility] of patient's status, medical history and the need for 1:1 observation to carry out patient's orders. An order was obtained from Provider for a 1:1 and an order specifying number of oxygen liters. Will continue to monitor patient for safety.  2350 Patient is alert and oriented x 4 resting in bed 02 at 3 liters via nasal cannula and 1:1 sitter in place at bedside for safety.
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
## 2022-06-30 NOTE — ED Triage Notes (Signed)
Per patient's report, patient has had increased feelings of anxiety and depression which has led to suicidal feelings. Patient was brought in by his girlfriend. Patient states he has been drinking more alcohol to deal with these feelings. Patient states he occasionally smokes marijuana. Patient states he has been taking his Prozac.

## 2022-06-30 NOTE — ED Provider Notes (Signed)
Patient seen by psychiatry and they are recommending inpatient psych.  Patient sodium chronically low it is 121 today has been around this in the past.  We will avoid any rapid correction.  Suspect that once patient is not drinking and eating normal meals that this will self-correct.  He is on CIWA currently.   Georga Hacking, MD 06/30/22 1640

## 2022-06-30 NOTE — Plan of Care (Signed)
  Problem: Education: Goal: Knowledge of General Education information will improve Description: Including pain rating scale, medication(s)/side effects and non-pharmacologic comfort measures Outcome: Not Progressing   Problem: Health Behavior/Discharge Planning: Goal: Ability to manage health-related needs will improve Outcome: Not Progressing   Problem: Clinical Measurements: Goal: Ability to maintain clinical measurements within normal limits will improve Outcome: Not Progressing Goal: Will remain free from infection Outcome: Not Progressing Goal: Diagnostic test results will improve Outcome: Not Progressing Goal: Respiratory complications will improve Outcome: Not Progressing Goal: Cardiovascular complication will be avoided Outcome: Not Progressing   Problem: Activity: Goal: Risk for activity intolerance will decrease Outcome: Not Progressing   Problem: Nutrition: Goal: Adequate nutrition will be maintained Outcome: Not Progressing   Problem: Coping: Goal: Level of anxiety will decrease Outcome: Not Progressing   Problem: Elimination: Goal: Will not experience complications related to bowel motility Outcome: Not Progressing Goal: Will not experience complications related to urinary retention Outcome: Not Progressing   Problem: Pain Managment: Goal: General experience of comfort will improve Outcome: Not Progressing   

## 2022-06-30 NOTE — ED Provider Notes (Signed)
[facility] Provider Note    Event Date/Time   First MD Initiated Contact with Patient 06/30/22 1212     (approximate)   History   Suicidal   HPI  Andrew Sparks is a 56 y.o. male with past medical history of A-fib prescribed Eliquis and amiodarone as well as metoprolol, anxiety, chronic hyponatremia, CHF, HTN, obesity, CVA, OSA tracheostomy as well as depression treated with Prozac who presents for evaluation of worsening depression and suicidal thoughts.  He denies any specific stressors but notes he has been drinking quite heavily up to 1/5 of liquor per day over the last couple days to help deal with his depression.  He endorses some THC use but denies any other illicit drug use.  He denies any acute physical symptoms including fevers, cough, chest pain, shortness of breath, abdominal pain, nausea, vomiting, diarrhea, rash or any acute focal extremity weakness numbness or tingling.  He denies any recent injuries or falls.  He denies any HI or hallucinations.  He denies any attempt to harm himself prior to arrival.     Past Medical History:  Diagnosis Date   A-fib [facility])    Anxiety    CHF (congestive heart failure) (HCC)    Depression    Hypersomnia with sleep apnea    Hypertension    Morbid obesity (HCC)    NSTEMI (non-ST elevated myocardial infarction) (HCC) 2005   Obesity hypoventilation syndrome (HCC)    Shortness of breath dyspnea    Sleep apnea    Stroke (HCC)    V-tach [facility])      Physical Exam  Triage Vital Signs: ED Triage Vitals  Enc Vitals Group     BP 06/30/22 1121 (!) 123/99     Pulse Rate 06/30/22 1121 80     Resp 06/30/22 1121 17     Temp 06/30/22 1121 98.5 F (36.9 C)     Temp Source 06/30/22 1121 Oral     SpO2 06/30/22 1121 98 %     Weight 06/30/22 1121 218 lb (98.9 kg)     Height 06/30/22 1121 5\' 9"  (1.753 m)     Head Circumference --      Peak Flow --      Pain Score 06/30/22 1134 6     Pain Loc --      Pain  Edu? --      Excl. in GC? --     Most recent vital signs: Vitals:   06/30/22 1121  BP: (!) 123/99  Pulse: 80  Resp: 17  Temp: 98.5 F (36.9 C)  SpO2: 98%    General: Awake, no distress.  CV:  Good peripheral perfusion.  Resp:  Normal effort.  Abd:  No distention.  Other:  Patient is depressed and suicidal.  He does not seem acutely psychotic.   ED Results / Procedures / Treatments  Labs (all labs ordered are listed, but only abnormal results are displayed) Labs Reviewed  COMPREHENSIVE METABOLIC PANEL - Abnormal; Notable for the following components:      Result Value   Sodium 121 (*)    Potassium 3.2 (*)    Chloride 80 (*)    BUN 25 (*)    Creatinine, Ser 1.54 (*)    Total Protein 8.4 (*)    Total Bilirubin 2.0 (*)    GFR, Estimated 53 (*)    Anion gap 17 (*)    All other components within normal limits  CBC - Abnormal; Notable for  the following components:   RBC 4.16 (*)    HCT 36.8 (*)    All other components within normal limits  SARS CORONAVIRUS 2 BY RT PCR  ETHANOL  SALICYLATE LEVEL  ACETAMINOPHEN LEVEL  URINE DRUG SCREEN, QUALITATIVE ([facility] ONLY)  MAGNESIUM  PHOSPHORUS     EKG     RADIOLOGY    PROCEDURES:  Critical Care performed: No  Procedures    MEDICATIONS ORDERED IN ED: Medications  apixaban (ELIQUIS) tablet 5 mg (has no administration in time range)  amLODipine (NORVASC) tablet 2.5 mg (has no administration in time range)  FLUoxetine (PROZAC) capsule 40 mg (has no administration in time range)  metoprolol tartrate (LOPRESSOR) tablet 50 mg (has no administration in time range)  cyanocobalamin (VITAMIN B12) tablet 1,000 mcg (has no administration in time range)  amiodarone (PACERONE) tablet 200 mg (has no administration in time range)  LORazepam (ATIVAN) tablet 1-4 mg (has no administration in time range)    Or  LORazepam (ATIVAN) injection 1-4 mg (has no administration in time range)  thiamine (VITAMIN B1) tablet 100 mg (has no  administration in time range)    Or  thiamine (VITAMIN B1) injection 100 mg (has no administration in time range)  folic acid (FOLVITE) tablet 1 mg (has no administration in time range)  multivitamin with minerals tablet 1 tablet (has no administration in time range)  potassium chloride SA (KLOR-CON M) CR tablet 40 mEq (has no administration in time range)     IMPRESSION / MDM / ASSESSMENT AND PLAN / ED COURSE  I reviewed the triage vital signs and the nursing notes. Patient's presentation is most consistent with acute presentation with potential threat to life or bodily function.                               Differential diagnosis includes, but is not limited to underlying depression is suspect likely exacerbated by his alcohol abuse.  He identify any other specific stressors at this time.  He denies any other acute physical complaints.  I will restart his home medications.  Routine psychiatric screening labs sent.  Psychiatry TTS consulted.  Patient is here voluntarily requesting help with depression no acute pathology at this time.  The patient has been placed in psychiatric observation due to the need to provide a safe environment for the patient while obtaining psychiatric consultation and evaluation, as well as ongoing medical and medication management to treat the patient's condition.  The patient has not been placed under full IVC at this time.  CMP is remarkable for evidence of chronic hyponatremia stable CKD.  His sodium today of 121 is corrected to 122 4 months ago and returning for 6 months for that.  His K is 3.2.  Chloride is 88 compared to 77 4 months ago.  His creatinine is 1.54 compared to 1.44 months ago.  Total bili is 2 and his anion gap 17 with no other significant electrolyte or metabolic derangements.  CBC without leukocytosis or acute anemia.  Magnesium is 1.5.  Phosphorus WNL UDS is negative.  UA does have some protein and rare bacteria but otherwise does not appear  infected.  COVID PCR negative.  Potassium and magnesium repleted.  I suspect his chronic hyponatremia and hypochloremia and hypokalemia is all related to patient's alcohol abuse.  I suspect this will improve with alcohol cessation.  Patient is otherwise asymptomatic aside from his chronic paresthesias in his legs  otherwise do not believe this requires emergency department correction of his hyponatremia at this time.  At this point think patient is medically cleared for psychiatric evaluation and patient treatment of his depression if needed although he will definitely need to follow-up with PCP once discharged to have his electrolytes rechecked.       FINAL CLINICAL IMPRESSION(S) / ED DIAGNOSES   Final diagnoses:  Suicidal ideation  Alcohol abuse  Chronic hyponatremia  Hypokalemia     Rx / DC Orders   ED Discharge Orders     None        Note:  This document was prepared using Dragon voice recognition software and may include unintentional dictation errors.   Gilles Chiquito, MD 06/30/22 [phone]

## 2022-06-30 NOTE — ED Notes (Signed)
Pt brought from lobby. Pt states he has been having suicidal feeling sbut no specific plan or mechanism to carry out SI. Pt is calm and cooperative. Pt requests urinal, provided. Denies other needs. Pt states has been drinking more recently to deal with feelings and when asked, states for last 5 days had had 1/5 hard liquor per day with last drink last night about 7pm. EDP at bedside now talking with pt.

## 2022-06-30 NOTE — BH Assessment (Incomplete)
2215 This writer observed wetness and then bubbles of secretion coming from around the patient's neck. 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 He stated that the trach "Just  fell out about six months ago. He reports that the stoma has not closed and that he has used oxygen three liters every night since his cardiac arrest from being in congested heart failure. The patient  has an order for oxygen set up at the bedside at [facility].   This Clinical research associate spoke with the [facility] and informed the [facility] of patient's status, medical history and the need for 1:1 observation to carry out patient's orders. An order was obtained from Provider for a !

## 2022-06-30 NOTE — Progress Notes (Signed)
Patient admitted voluntary to [facility] from [facility] ED with diagnosis of worsening depression. Patient states his depression and anxiety have been getting worse and he has been drinking more alcohol to cope. Patient presents to unit ambulatory A&Ox4. Patient states, "I want to get my anxiety and depression under control." Patient's affect is calm, and cooperative. Speech is hard to understand at times due to open old trach site and thoughts are disorganized OR Patient's affect is extremely anxious and worried / Patient endorses depression and anxiety. Patient currently expresses suicidal ideations, but denies homicidal ideations, audio or visual hallucinations and verbally contracts for safety on unit. Reports chronic foot pain d/t neuropathy. Pt reports generalized weakness and using cane at home. Patient reports poor appetite. Patient expresses smoking and drug use. Patient states he has a good support system including his brother and son.   Emotional support and reassurance provided throughout admission intake. Afterwards, oriented patient to unit, room and call light, reviewed POC with all questions answered and understanding verbalzied. Denies any needs at this time.  Will continue to monitor with ongoing Q 15 minute safety checks per unit protocol.

## 2022-06-30 NOTE — ED Notes (Signed)
Dinner tray given

## 2022-06-30 NOTE — Plan of Care (Signed)
  Problem: Education: Goal: Knowledge of General Education information will improve Description: Including pain rating scale, medication(s)/side effects and non-pharmacologic comfort measures 06/30/2022 1815 by Doneen Poisson, RN Outcome: Not Progressing 06/30/2022 1815 by Doneen Poisson, RN Outcome: Not Progressing   Problem: Health Behavior/Discharge Planning: Goal: Ability to manage health-related needs will improve 06/30/2022 1815 by Doneen Poisson, RN Outcome: Not Progressing 06/30/2022 1815 by Doneen Poisson, RN Outcome: Not Progressing   Problem: Clinical Measurements: Goal: Ability to maintain clinical measurements within normal limits will improve 06/30/2022 1815 by Doneen Poisson, RN Outcome: Not Progressing 06/30/2022 1815 by Doneen Poisson, RN Outcome: Not Progressing Goal: Will remain free from infection 06/30/2022 1815 by Doneen Poisson, RN Outcome: Not Progressing 06/30/2022 1815 by Doneen Poisson, RN Outcome: Not Progressing Goal: Diagnostic test results will improve 06/30/2022 1815 by Doneen Poisson, RN Outcome: Not Progressing 06/30/2022 1815 by Doneen Poisson, RN Outcome: Not Progressing Goal: Respiratory complications will improve 06/30/2022 1815 by Doneen Poisson, RN Outcome: Not Progressing 06/30/2022 1815 by Doneen Poisson, RN Outcome: Not Progressing Goal: Cardiovascular complication will be avoided 06/30/2022 1815 by Doneen Poisson, RN Outcome: Not Progressing 06/30/2022 1815 by Doneen Poisson, RN Outcome: Not Progressing   Problem: Elimination: Goal: Will not experience complications related to bowel motility 06/30/2022 1815 by Doneen Poisson, RN Outcome: Not Progressing 06/30/2022 1815 by Doneen Poisson, RN Outcome: Not Progressing Goal: Will not experience complications related to urinary retention 06/30/2022 1815 by Doneen Poisson, RN Outcome: Not Progressing 06/30/2022 1815 by Doneen Poisson, RN Outcome: Not Progressing   Problem: Pain Managment: Goal: General experience of comfort will improve 06/30/2022 1815 by Doneen Poisson, RN Outcome: Not Progressing 06/30/2022 1815 by Doneen Poisson, RN Outcome: Not Progressing   Problem: Skin Integrity: Goal: Risk for impaired skin integrity will decrease 06/30/2022 1815 by Doneen Poisson, RN Outcome: Not Progressing 06/30/2022 1815 by Doneen Poisson, RN Outcome: Not Progressing   Problem: Safety: Goal: Ability to remain free from injury will improve 06/30/2022 1815 by Doneen Poisson, RN Outcome: Not Progressing 06/30/2022 1815 by Doneen Poisson, RN Outcome: Not Progressing   Problem: Education: Goal: Knowledge of [facility] Education information/materials will improve Outcome: Not Progressing Goal: Emotional status will improve Outcome: Not Progressing Goal: Mental status will improve Outcome: Not Progressing Goal: Verbalization of understanding the information provided will improve Outcome: Not Progressing   Problem: Coping: Goal: Ability to verbalize frustrations and anger appropriately will improve Outcome: Not Progressing Goal: Ability to demonstrate self-control will improve Outcome: Not Progressing   Problem: Health Behavior/Discharge Planning: Goal: Identification of resources available to assist in meeting health care needs will improve Outcome: Not Progressing Goal: Compliance with treatment plan for underlying cause of condition will improve Outcome: Not Progressing   Problem: Physical Regulation: Goal: Ability to maintain clinical measurements within normal limits will improve Outcome: Not Progressing   Problem: Safety: Goal: Periods of time without injury will increase Outcome: Not Progressing

## 2022-06-30 NOTE — BH Assessment (Signed)
Comprehensive Clinical Assessment (CCA) Note  06/30/2022 Andrew Sparks [id number]  Chief Complaint:  Chief Complaint  Patient presents with   Suicidal   Visit Diagnosis: Major Depression   Andrew Sparks is 56 year old who presents to the ER due to having thoughts of SI, increase depression and anxiety. He states he has dealt with depression in the past but not to this degree. He also states he has used alcohol to self medicate for his anxiety. He is having anxiety on a daily basis and that is new to him as well.  During the interview the patient was calm, cooperative and pleasant. Patient was able to provide appropriate answers to the questions. Throughout the interview, the patient denies HI and AV/H.  CCA Screening, Triage and Referral (STR)  Patient Reported Information How did you hear about [place]? Self  What Is the Reason for Your Visit/Call Today? Came to the ER due to having increase anxiety, and depressed and SI.  How Long Has This Been Causing You Problems? 1 wk - 1 month  What Do You Feel Would Help You the Most Today? Treatment for Depression or other mood problem; Alcohol or Drug Use Treatment   Have You Recently Had Any Thoughts About Hurting Yourself? Yes  Are You Planning to Commit Suicide/Harm Yourself At This time? Yes   Have you Recently Had Thoughts About Hurting Someone [name]? No  Are You Planning to Harm Someone at This Time? No  Explanation: No data recorded  Have You Used Any Alcohol or Drugs in the Past 24 Hours? Yes  How Long Ago Did You Use Drugs or Alcohol? No data recorded What Did You Use and How Much? Alcohol 06/30/2022   Do You Currently Have a Therapist/Psychiatrist? No  Name of Therapist/Psychiatrist: No data recorded  Have You Been Recently Discharged From Any Office Practice or Programs? No  Explanation of Discharge From Practice/Program: No data recorded    CCA Screening Triage Referral Assessment Type of Contact:  Face-to-Face  Telemedicine Service Delivery:   Is this Initial or Reassessment? No data recorded Date Telepsych consult ordered in [facility]:  No data recorded Time Telepsych consult ordered in [facility]:  No data recorded Location of Assessment: [facility] ED  Provider Location: [facility] ED   Collateral Involvement: No data recorded  Does Patient Have a Court Appointed Legal Guardian? No data recorded Name and Contact of Legal Guardian: No data recorded If Minor and Not Living with Parent(s), Who has Custody? No data recorded Is CPS involved or ever been involved? Never  Is APS involved or ever been involved? Never   Patient Determined To Be At Risk for Harm To Self or Others Based on Review of Patient Reported Information or Presenting Complaint? No  Method: No data recorded Availability of Means: No data recorded Intent: No data recorded Notification Required: No data recorded Additional Information for Danger to Others Potential: No data recorded Additional Comments for Danger to Others Potential: No data recorded Are There Guns or Other Weapons in Your Home? No data recorded Types of Guns/Weapons: No data recorded Are These Weapons Safely Secured?                            No data recorded Who Could Verify You Are Able To Have These Secured: No data recorded Do You Have any Outstanding Charges, Pending Court Dates, Parole/Probation? No data recorded Contacted To Inform of Risk of Harm To Self or  Others: No data recorded   Does Patient Present under Involuntary Commitment? No  IVC Papers Initial File Date: No data recorded  [place] of Residence: [place]   Patient Currently Receiving the Following Services: Not Receiving Services   Determination of Need: Emergent (2 hours)   Options For Referral: ED Visit; Inpatient Hospitalization     CCA Biopsychosocial Patient Reported Schizophrenia/Schizoaffective Diagnosis in Past: No   Strengths: Have support system, stable housing, &  seeking help   Mental Health Symptoms Depression:   Difficulty Concentrating; Hopelessness; Worthlessness; Change in energy/activity   Duration of Depressive symptoms:  Duration of Depressive Symptoms: Greater than two weeks   Mania:   N/A   Anxiety:    Restlessness; Worrying; Difficulty concentrating; Irritability   Psychosis:   None   Duration of Psychotic symptoms:    Trauma:   N/A   Obsessions:   N/A   Compulsions:   N/A   Inattention:   N/A   Hyperactivity/Impulsivity:   N/A   Oppositional/Defiant Behaviors:   N/A   Emotional Irregularity:   N/A   Other Mood/Personality Symptoms:  No data recorded   Mental Status Exam Appearance and self-care  Stature:   Average   Weight:   Average weight   Clothing:   Neat/clean; Age-appropriate   Grooming:   Normal   Cosmetic use:   None   Posture/gait:   Normal   Motor activity:   -- (Within normal range)   Sensorium  Attention:   Normal   Concentration:   Normal   Orientation:   X5   Recall/memory:   Normal   Affect and Mood  Affect:   Anxious; Full Range; Depressed   Mood:   Anxious; Depressed   Relating  Eye contact:   Normal   Facial expression:   Responsive; Depressed; Anxious   Attitude toward examiner:   Cooperative   Thought and Language  Speech flow:  Clear and Coherent; Normal   Thought content:   Appropriate to Mood and Circumstances   Preoccupation:   Ruminations   Hallucinations:   None   Organization:  No data recorded  [employer] of Knowledge:   Fair   Intelligence:   Average   Abstraction:   Normal   Judgement:   Normal   Reality TestingNaval architect; Adequate   Insight:   Present; Fair   Decision Making:   Normal   Social Functioning  Social Maturity:   Isolates; Responsible   Social Judgement:   Chemical engineer"; Normal   Stress  Stressors:   Relationship; Other (Comment)   Coping Ability:   Normal    Skill Deficits:   None   Supports:   Friends/Service system     Religion: Religion/Spirituality Are You A Religious Person?: No  Leisure/Recreation: Leisure / Recreation Do You Have Hobbies?: No  Exercise/Diet: Exercise/Diet Do You Exercise?: No Have You Gained or Lost A Significant Amount of Weight in the Past Six Months?: No Do You Follow a Special Diet?: No Do You Have Any Trouble Sleeping?: No   CCA Employment/Education Employment/Work Situation: Employment / Work Situation Employment Situation: Unemployed Patient's Job has Been Impacted by Current Illness: No Has Patient ever Been in Equities trader?: No  Education: Education Is Patient Currently Attending School?: No Did Theme park manager?: No Did You Have An Individualized Education Program (IIEP): No Did You Have Any Difficulty At [employer]?: No Patient's Education Has Been Impacted by Current Illness: No   CCA Family/Childhood History  Family and Relationship History: Family history Marital status: Single Does patient have children?: No  Childhood History:  Childhood History By whom was/is the patient raised?: Mother Did patient suffer any verbal/emotional/physical/sexual abuse as a child?: No Did patient suffer from severe childhood neglect?: No Has patient ever been sexually abused/assaulted/raped as an adolescent or adult?: No Was the patient ever a victim of a crime or a disaster?: No Witnessed domestic violence?: No Has patient been affected by domestic violence as an adult?: No  Child/Adolescent Assessment:     CCA Substance Use Alcohol/Drug Use: Alcohol / Drug Use Pain Medications: See PTA Prescriptions: See PTA Over the Counter: See PTA History of alcohol / drug use?: Yes Longest period of sobriety (when/how long): Unable to quantify Substance #1 Name of Substance 1: Alcohol 1 - Last Use / Amount: 06/30/2022 Substance #2 Name of Substance 2: Cannabis 2 - Last Use / Amount:  06/2022   ASAM's:  Six Dimensions of Multidimensional Assessment  Dimension 1:  Acute Intoxication and/or Withdrawal Potential:      Dimension 2:  Biomedical Conditions and Complications:      Dimension 3:  Emotional, Behavioral, or Cognitive Conditions and Complications:     Dimension 4:  Readiness to Change:     Dimension 5:  Relapse, Continued use, or Continued Problem Potential:     Dimension 6:  Recovery/Living Environment:     ASAM Severity Score:    ASAM Recommended Level of Treatment:     Substance use Disorder (SUD)    Recommendations for Services/Supports/Treatments:    Discharge Disposition:    DSM5 Diagnoses: Patient Active Problem List   Diagnosis Date Noted   AF (paroxysmal atrial fibrillation) (HCC)    Vitamin B12 deficiency    AKI (acute kidney injury) (HCC) 03/28/2021   Essential hypertension 03/28/2021   Hyponatremia 04/20/2020   Accelerated hypertension    Acute on chronic diastolic CHF (congestive heart failure) (HCC)    Atrial fibrillation, chronic (HCC)    COPD with acute exacerbation (HCC)    Depression with anxiety    Alcohol abuse    Tobacco abuse    Tobacco abuse counseling    Ischemic stroke (HCC) 04/20/2019   ARF (acute renal failure) (HCC) 03/26/2018   CHF (congestive heart failure) (HCC) 07/17/2017   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   Panic disorder 01/18/2017   Chest pain 01/17/2017   Obstructive sleep apnea syndrome 02/01/2016   Tracheostomy care [facility])    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

## 2022-06-30 NOTE — TOC Initial Note (Signed)
Transition of Care [facility]) - Initial/Assessment Note    Patient Details  Name: Andrew Sparks MRN: [id number] Date of Birth: Apr 29, 1966  Transition of Care [facility]) CM/SW Contact:    Allayne Butcher, RN Phone Number: 06/30/2022, 2:40 PM  Clinical Narrative:                 [facility] consult acknowledged.  TOC will sign off as patient coming in for suicidal ideation.  Psychiatry and TTS have been consulted.  Please re- consult if additional needs arise.        Patient Goals and CMS Choice        Expected Discharge Plan and Services                                                Prior Living Arrangements/Services                       Activities of Daily Living      Permission Sought/Granted                  Emotional Assessment              Admission diagnosis:  sent by dr beh med eval Patient Active Problem List   Diagnosis Date Noted   AF (paroxysmal atrial fibrillation) (HCC)    Vitamin B12 deficiency    AKI (acute kidney injury) (HCC) 03/28/2021   Essential hypertension 03/28/2021   Hyponatremia 04/20/2020   Accelerated hypertension    Acute on chronic diastolic CHF (congestive heart failure) (HCC)    Atrial fibrillation, chronic (HCC)    COPD with acute exacerbation (HCC)    Depression with anxiety    Alcohol abuse    Tobacco abuse    Tobacco abuse counseling    Ischemic stroke (HCC) 04/20/2019   ARF (acute renal failure) (HCC) 03/26/2018   CHF (congestive heart failure) (HCC) 07/17/2017   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   Panic disorder 01/18/2017   Chest pain 01/17/2017   Obstructive sleep apnea syndrome 02/01/2016   Tracheostomy care [facility])    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 [place]) [place] [address] Phone: [phone] Fax: [phone]     Social Determinants of Health (SDOH) Interventions    Readmission Risk Interventions     No data to display
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
## 2022-06-30 NOTE — ED Notes (Signed)
Report given to Whitney.

## 2022-06-30 NOTE — Tx Team (Signed)
Initial Treatment Plan 06/30/2022 6:11 PM Andrew Sparks IOE:[id number]    PATIENT STRESSORS: Health problems     PATIENT STRENGTHS: Motivation for treatment/growth    PATIENT IDENTIFIED PROBLEMS: Mental problems/ anxiety/depression                     DISCHARGE CRITERIA:  Ability to meet basic life and health needs Improved stabilization in mood, thinking, and/or behavior  PRELIMINARY DISCHARGE PLAN: Return to previous living arrangement  PATIENT/FAMILY INVOLVEMENT: This treatment plan has been presented to and reviewed with the patient. The patient has been given the opportunity to ask questions and make suggestions.  Doneen Poisson, RN 06/30/2022, 6:11 PM

## 2022-07-01 DIAGNOSIS — F332 Major depressive disorder, recurrent severe without psychotic features: Secondary | ICD-10-CM

## 2022-07-01 MED ORDER — RISPERIDONE 1 MG PO TABS
0.5000 mg | ORAL_TABLET | ORAL | Status: DC
  Administered 2022-07-01 – 2022-07-02 (×3): 0.5 mg via ORAL
  Filled 2022-07-01 (×3): qty 1

## 2022-07-01 MED ORDER — VENLAFAXINE HCL ER 75 MG PO CP24
75.0000 mg | ORAL_CAPSULE | Freq: Every day | ORAL | Status: DC
  Administered 2022-07-02: 75 mg via ORAL
  Filled 2022-07-01 (×2): qty 1

## 2022-07-01 MED ORDER — QUETIAPINE FUMARATE 100 MG PO TABS
100.0000 mg | ORAL_TABLET | Freq: Every day | ORAL | Status: DC
Start: 2022-07-01 — End: 2022-07-02
  Administered 2022-07-01: 100 mg via ORAL
  Filled 2022-07-01: qty 1

## 2022-07-01 MED ORDER — METOPROLOL TARTRATE 25 MG PO TABS
25.0000 mg | ORAL_TABLET | Freq: Two times a day (BID) | ORAL | Status: DC
  Administered 2022-07-02: 25 mg via ORAL
  Filled 2022-07-01 (×2): qty 1

## 2022-07-01 MED ORDER — GABAPENTIN 100 MG PO CAPS
400.0000 mg | ORAL_CAPSULE | Freq: Three times a day (TID) | ORAL | Status: DC
  Administered 2022-07-01 – 2022-07-02 (×4): 400 mg via ORAL
  Filled 2022-07-01 (×4): qty 1

## 2022-07-01 NOTE — BH Assessment (Signed)
2215 This writer observed wetness and then bubbles of secretion coming from around the patient's neck. 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 He stated that the trach "Just  fell out about six months ago. He reports that the stoma has not closed and that he has used oxygen three liters every night since his cardiac arrest from being in congested heart failure. The patient  has an order for oxygen set up at the bedside at [facility]. Will continue  to monitor patient for safety.   2240 This Clinical research associate spoke with the [facility] and informed the [facility] of patient's status, medical history and the need for 1:1 observation to carry out patient's orders. An order was obtained from Provider for a 1:1 and an order specifying number of oxygen liters. Will continue to monitor patient for safety.  2330 Stoma cleansed with sterile water using a cotton swab. Stoma is without signs of infection. Stoma is patients natural skin tone draining a small amount of non odorous thick clear secretions. A non adhesive dressing placed very loosely over stoma.  2350 Patient is alert and oriented x 4 resting in bed 02 at 3 liters via nasal cannula and 1:1 sitter in place at bedside for safety.
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
## 2022-07-01 NOTE — Progress Notes (Addendum)
Patient is seen in the dayroom at the beginning of the shift. AAOx4. He is calm and cooperative. He denies SI/ HI and AVH at this time. He denied pain. He stated that he feels anxious about being in a new environment. He rated his anxiety a 8.5/10. He also stated he is depressed and rated that a 8/10. He complained of sleep disturbance and insomnia due to working 2nd shift he is not used to sleeping at his time.   Patient became 1:1 due to orders of oxygen at bedside being placed. Pt has a history of trach usage. He stated that he coughed out his trach 6 months ago. He also stated that he has been sleeping with oxygen 3 liters via nasal canula for the last 5 years. Pt put on 1:1 for safety risk and O2 being checked every hour with dinamap while asleep. Pt is currently asleep with sitter by his side. Q 15 min safety checks in place.

## 2022-07-01 NOTE — B[facility] Suicide Risk Assessment (Signed)
[facility] Admission Suicide Risk Assessment   Nursing information obtained from:  Patient Demographic factors:  Male Current Mental Status:  Self-harm thoughts Loss Factors:  NA Historical Factors:  NA Risk Reduction Factors:  Positive social support  Total Time spent with patient: 1 hour Principal Problem: Major depressive disorder, recurrent severe without psychotic features (HCC) Diagnosis:  Principal Problem:   Major depressive disorder, recurrent severe without psychotic features (HCC)  Subjective Data:  56 yo male presents with worsening depression and suicidal ideations with a plan to overdose.  "I've been having suicidal ideations and depression and trying to cure it with alcohol."  He's been drinking a 1/5 of liquor daily for the past two weeks, drinking daily prior to this time also.  No homicidal ideations or psychosis or hospitalizations.  He does use cannabis to assist his symptoms along with nicotine.  Sleep is poor along with appetite.  History of COPD  Continued Clinical Symptoms:  Alcohol Use Disorder Identification Test Final Score (AUDIT): 23 The "Alcohol Use Disorders Identification Test", Guidelines for Use in Primary Care, Second Edition.  World Science writer [facility]). Score between 0-7:  no or low risk or alcohol related problems. Score between 8-15:  moderate risk of alcohol related problems. Score between 16-19:  high risk of alcohol related problems. Score 20 or above:  warrants further diagnostic evaluation for alcohol dependence and treatment.   CLINICAL FACTORS:   Depression:   Anhedonia   Musculoskeletal: Strength & Muscle Tone: within normal limits Gait & Station: normal Patient leans: N/A  Psychiatric Specialty Exam:  Presentation  General Appearance: No data recorded Eye Contact:No data recorded Speech:No data recorded Speech Volume:No data recorded Handedness:No data recorded  Mood and Affect  Mood:No data recorded Affect:No data  recorded  Thought Process  Thought Processes:No data recorded Descriptions of Associations:No data recorded Orientation:No data recorded Thought Content:No data recorded History of Schizophrenia/Schizoaffective disorder:No  Duration of Psychotic Symptoms:No data recorded Hallucinations:No data recorded Ideas of Reference:No data recorded Suicidal Thoughts:No data recorded Homicidal Thoughts:No data recorded  Sensorium  Memory:No data recorded Judgment:No data recorded Insight:No data recorded  Executive Functions  Concentration:No data recorded Attention Span:No data recorded Recall:No data recorded Fund of Knowledge:No data recorded Language:No data recorded  Psychomotor Activity  Psychomotor Activity:No data recorded  Assets  Assets:No data recorded  Sleep  Sleep:No data recorded    Blood pressure (!) 116/98, pulse 62, temperature 98.2 F (36.8 C), resp. rate 18, height 5\' 9"  (1.753 m), weight 98.4 kg, SpO2 98 %. Body mass index is 32.05 kg/m.   COGNITIVE FEATURES THAT CONTRIBUTE TO RISK:  None    SUICIDE RISK:   Minimal: No identifiable suicidal ideation.  Patients presenting with no risk factors but with morbid ruminations; may be classified as minimal risk based on the severity of the depressive symptoms  PLAN OF CARE: See orders  I certify that inpatient services furnished can reasonably be expected to improve the patient's condition.   , DO 07/01/2022, 10:36 AM

## 2022-07-01 NOTE — Progress Notes (Signed)
Patient noted to have excessive saliva secretions, and he drools at times.

## 2022-07-01 NOTE — Plan of Care (Signed)
Patient is alert and oriented times 4. Mood and affect pleasant, and sullen. Patient denies pain at this time. He denies SI, HI, and AVH. Expresses feelings of anxiety and depression at this time. States he slept good last night. Morning meds given whole by mouth W/O difficulty. Ate breakfast in day room- appetite good. Patient remains on unit with Q15 minute checks in place.     Problem: Education: Goal: Knowledge of General Education information will improve Description: Including pain rating scale, medication(s)/side effects and non-pharmacologic comfort measures 07/01/2022 0958 by Doneen Poisson, RN Outcome: Progressing 07/01/2022 0958 by Doneen Poisson, RN Outcome: Progressing   Problem: Health Behavior/Discharge Planning: Goal: Ability to manage health-related needs will improve 07/01/2022 0958 by Doneen Poisson, RN Outcome: Progressing 07/01/2022 0958 by Doneen Poisson, RN Outcome: Progressing   Problem: Clinical Measurements: Goal: Ability to maintain clinical measurements within normal limits will improve 07/01/2022 0958 by Doneen Poisson, RN Outcome: Progressing 07/01/2022 0958 by Doneen Poisson, RN Outcome: Progressing Goal: Will remain free from infection 07/01/2022 0958 by Doneen Poisson, RN Outcome: Progressing 07/01/2022 0958 by Doneen Poisson, RN Outcome: Progressing Goal: Diagnostic test results will improve 07/01/2022 0958 by Doneen Poisson, RN Outcome: Progressing 07/01/2022 0958 by Doneen Poisson, RN Outcome: Progressing Goal: Respiratory complications will improve 07/01/2022 0958 by Doneen Poisson, RN Outcome: Progressing 07/01/2022 0958 by Doneen Poisson, RN Outcome: Progressing Goal: Cardiovascular complication will be avoided 07/01/2022 0958 by Doneen Poisson, RN Outcome: Progressing 07/01/2022 0958 by Doneen Poisson, RN Outcome: Progressing   Problem: Activity: Goal: Risk for activity intolerance will  decrease 07/01/2022 0958 by Doneen Poisson, RN Outcome: Progressing 07/01/2022 0958 by Doneen Poisson, RN Outcome: Progressing   Problem: Nutrition: Goal: Adequate nutrition will be maintained 07/01/2022 0958 by Doneen Poisson, RN Outcome: Progressing 07/01/2022 0958 by Doneen Poisson, RN Outcome: Progressing   Problem: Coping: Goal: Level of anxiety will decrease 07/01/2022 0958 by Doneen Poisson, RN Outcome: Progressing 07/01/2022 0958 by Doneen Poisson, RN Outcome: Progressing   Problem: Elimination: Goal: Will not experience complications related to bowel motility 07/01/2022 0958 by Doneen Poisson, RN Outcome: Progressing 07/01/2022 0958 by Doneen Poisson, RN Outcome: Progressing Goal: Will not experience complications related to urinary retention 07/01/2022 0958 by Doneen Poisson, RN Outcome: Progressing 07/01/2022 0958 by Doneen Poisson, RN Outcome: Progressing   Problem: Pain Managment: Goal: General experience of comfort will improve 07/01/2022 0958 by Doneen Poisson, RN Outcome: Progressing 07/01/2022 0958 by Doneen Poisson, RN Outcome: Progressing   Problem: Safety: Goal: Ability to remain free from injury will improve 07/01/2022 0958 by Doneen Poisson, RN Outcome: Progressing 07/01/2022 0958 by Doneen Poisson, RN Outcome: Progressing   Problem: Skin Integrity: Goal: Risk for impaired skin integrity will decrease 07/01/2022 0958 by Doneen Poisson, RN Outcome: Progressing 07/01/2022 0958 by Doneen Poisson, RN Outcome: Progressing   Problem: Education: Goal: Knowledge of [facility] Education information/materials will improve 07/01/2022 0958 by Doneen Poisson, RN Outcome: Progressing 07/01/2022 0958 by Doneen Poisson, RN Outcome: Progressing Goal: Emotional status will improve 07/01/2022 0958 by Doneen Poisson, RN Outcome: Progressing 07/01/2022 0958 by Doneen Poisson,  RN Outcome: Progressing Goal: Mental status will improve 07/01/2022 0958 by Doneen Poisson, RN Outcome: Progressing 07/01/2022 0958 by Doneen Poisson, RN Outcome: Progressing Goal: Verbalization of understanding the information provided will improve 07/01/2022 0958 by Doneen Poisson, RN  Outcome: Progressing 07/01/2022 0958 by Doneen Poisson, RN Outcome: Progressing   Problem: Activity: Goal: Interest or engagement in activities will improve 07/01/2022 0958 by Doneen Poisson, RN Outcome: Progressing 07/01/2022 0958 by Doneen Poisson, RN Outcome: Progressing Goal: Sleeping patterns will improve 07/01/2022 0958 by Doneen Poisson, RN Outcome: Progressing 07/01/2022 0958 by Doneen Poisson, RN Outcome: Progressing   Problem: Coping: Goal: Ability to verbalize frustrations and anger appropriately will improve 07/01/2022 0958 by Doneen Poisson, RN Outcome: Progressing 07/01/2022 0958 by Doneen Poisson, RN Outcome: Progressing Goal: Ability to demonstrate self-control will improve 07/01/2022 0958 by Doneen Poisson, RN Outcome: Progressing 07/01/2022 0958 by Doneen Poisson, RN Outcome: Progressing   Problem: Health Behavior/Discharge Planning: Goal: Identification of resources available to assist in meeting health care needs will improve 07/01/2022 0958 by Doneen Poisson, RN Outcome: Progressing 07/01/2022 0958 by Doneen Poisson, RN Outcome: Progressing Goal: Compliance with treatment plan for underlying cause of condition will improve 07/01/2022 0958 by Doneen Poisson, RN Outcome: Progressing 07/01/2022 0958 by Doneen Poisson, RN Outcome: Progressing   Problem: Physical Regulation: Goal: Ability to maintain clinical measurements within normal limits will improve 07/01/2022 0958 by Doneen Poisson, RN Outcome: Progressing 07/01/2022 0958 by Doneen Poisson, RN Outcome: Progressing   Problem: Safety: Goal: Periods of  time without injury will increase 07/01/2022 0958 by Doneen Poisson, RN Outcome: Progressing 07/01/2022 0958 by Doneen Poisson, RN Outcome: Progressing   Problem: Education: Goal: Utilization of techniques to improve thought processes will improve 07/01/2022 0958 by Doneen Poisson, RN Outcome: Progressing 07/01/2022 0958 by Doneen Poisson, RN Outcome: Progressing Goal: Knowledge of the prescribed therapeutic regimen will improve 07/01/2022 0958 by Doneen Poisson, RN Outcome: Progressing 07/01/2022 0958 by Doneen Poisson, RN Outcome: Progressing   Problem: Activity: Goal: Interest or engagement in leisure activities will improve 07/01/2022 0958 by Doneen Poisson, RN Outcome: Progressing 07/01/2022 0958 by Doneen Poisson, RN Outcome: Progressing Goal: Imbalance in normal sleep/wake cycle will improve Outcome: Progressing   Problem: Coping: Goal: Coping ability will improve Outcome: Progressing Goal: Will verbalize feelings Outcome: Progressing   Problem: Health Behavior/Discharge Planning: Goal: Ability to make decisions will improve Outcome: Progressing Goal: Compliance with therapeutic regimen will improve Outcome: Progressing   Problem: Role Relationship: Goal: Will demonstrate positive changes in social behaviors and relationships Outcome: Progressing   Problem: Safety: Goal: Ability to disclose and discuss suicidal ideas will improve Outcome: Progressing Goal: Ability to identify and utilize support systems that promote safety will improve Outcome: Progressing   Problem: Self-Concept: Goal: Will verbalize positive feelings about self Outcome: Progressing Goal: Level of anxiety will decrease Outcome: Progressing   Problem: Education: Goal: Ability to state activities that reduce stress will improve Outcome: Progressing   Problem: Coping: Goal: Ability to identify and develop effective coping behavior will improve Outcome:  Progressing   Problem: Self-Concept: Goal: Ability to identify factors that promote anxiety will improve Outcome: Progressing Goal: Level of anxiety will decrease Outcome: Progressing Goal: Ability to modify response to factors that promote anxiety will improve Outcome: Progressing   Problem: Education: Goal: Ability to make informed decisions regarding treatment will improve Outcome: Progressing

## 2022-07-01 NOTE — Progress Notes (Signed)
Patient is asleep. In no current distress. Sitter at bedside. Q 15 min safety checks in place.
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
## 2022-07-01 NOTE — [facility] Suicide Risk Assessment (Signed)
[facility] INPATIENT:  Family/Significant Other Suicide Prevention Education  Suicide Prevention Education:  SPE completed with pt, as pt refused to consent to family contact. SPI pamphlet provided to pt and pt was encouraged to share information with support network, ask questions, and talk about any concerns relating to SPE. Pt denies access to guns/firearms and verbalized understanding of information provided. Mobile Crisis information also provided to pt.   Patient Refusal for Family/Significant Other Suicide Prevention Education: The patient Andrew Sparks has refused to provide written consent for family/significant other to be provided Family/Significant Other Suicide Prevention Education during admission and/or prior to discharge.  Physician notified.  Shelbylynn Walczyk A [place] 07/01/2022, 1:01 PM

## 2022-07-01 NOTE — Group Note (Signed)
LCSW Group Therapy Note  Group Date: 07/01/2022 Start Time: 1330 End Time: 1415   Type of Therapy and Topic:  Group Therapy - How To Cope with Nervousness about Discharge   Participation Level:  Minimal   Description of Group This process group involved identification of patients' feelings about discharge. Some of them are scheduled to be discharged soon, while others are new admissions, but each of them was asked to share thoughts and feelings surrounding discharge from the hospital. One common theme was that they are excited at the prospect of going home, while another was that many of them are apprehensive about sharing why they were hospitalized. Patients were given the opportunity to discuss these feelings with their peers in preparation for discharge.  Therapeutic Goals  Patient will identify their overall feelings about pending discharge. Patient will think about how they might proactively address issues that they believe will once again arise once they get home (i.e. with parents). Patients will participate in discussion about having hope for change.   Summary of Patient Progress:  Patient was present for the entirety of the group session. Patient was an active listener and participated in the topic of discussion, provided helpful advice to others, and added nuance to topic of conversation. He said that he wanted someone to talk to more regularly and felt that therapy would be helpful. He said he wants to get better and is hopeful that being on the waitlist for a new apartment will come through.   Therapeutic Modalities Cognitive Behavioral Therapy   Kaelea Gathright A [place], LCSWA 07/01/2022  3:14 PM

## 2022-07-02 ENCOUNTER — Other Ambulatory Visit: Payer: Self-pay

## 2022-07-02 ENCOUNTER — Encounter: Payer: Self-pay | Admitting: Family Medicine

## 2022-07-02 ENCOUNTER — Inpatient Hospital Stay
Admission: AD | Admit: 2022-07-02 | Discharge: 2022-07-06 | DRG: 683 | Disposition: A | Discharge status: Home / Self Care | Payer: Medicare HMO | Source: Ambulatory Visit | Attending: Internal Medicine | Admitting: Internal Medicine

## 2022-07-02 DIAGNOSIS — N179 Acute kidney failure, unspecified: Secondary | ICD-10-CM | POA: Diagnosis present

## 2022-07-02 DIAGNOSIS — E538 Deficiency of other specified B group vitamins: Secondary | ICD-10-CM | POA: Diagnosis present

## 2022-07-02 DIAGNOSIS — I959 Hypotension, unspecified: Secondary | ICD-10-CM | POA: Diagnosis not present

## 2022-07-02 DIAGNOSIS — Z8249 Family history of ischemic heart disease and other diseases of the circulatory system: Secondary | ICD-10-CM

## 2022-07-02 DIAGNOSIS — I2510 Atherosclerotic heart disease of native coronary artery without angina pectoris: Secondary | ICD-10-CM | POA: Diagnosis present

## 2022-07-02 DIAGNOSIS — E785 Hyperlipidemia, unspecified: Secondary | ICD-10-CM | POA: Diagnosis not present

## 2022-07-02 DIAGNOSIS — F1010 Alcohol abuse, uncomplicated: Secondary | ICD-10-CM | POA: Diagnosis present

## 2022-07-02 DIAGNOSIS — Z6831 Body mass index (BMI) 31.0-31.9, adult: Secondary | ICD-10-CM

## 2022-07-02 DIAGNOSIS — E861 Hypovolemia: Secondary | ICD-10-CM | POA: Diagnosis present

## 2022-07-02 DIAGNOSIS — I252 Old myocardial infarction: Secondary | ICD-10-CM

## 2022-07-02 DIAGNOSIS — G4710 Hypersomnia, unspecified: Secondary | ICD-10-CM | POA: Diagnosis present

## 2022-07-02 DIAGNOSIS — E662 Morbid (severe) obesity with alveolar hypoventilation: Secondary | ICD-10-CM | POA: Diagnosis present

## 2022-07-02 DIAGNOSIS — I10 Essential (primary) hypertension: Secondary | ICD-10-CM | POA: Diagnosis present

## 2022-07-02 DIAGNOSIS — E871 Hypo-osmolality and hyponatremia: Secondary | ICD-10-CM | POA: Diagnosis not present

## 2022-07-02 DIAGNOSIS — I480 Paroxysmal atrial fibrillation: Secondary | ICD-10-CM | POA: Diagnosis not present

## 2022-07-02 DIAGNOSIS — F419 Anxiety disorder, unspecified: Secondary | ICD-10-CM | POA: Diagnosis present

## 2022-07-02 DIAGNOSIS — N170 Acute kidney failure with tubular necrosis: Principal | ICD-10-CM | POA: Diagnosis present

## 2022-07-02 DIAGNOSIS — F17210 Nicotine dependence, cigarettes, uncomplicated: Secondary | ICD-10-CM | POA: Diagnosis present

## 2022-07-02 DIAGNOSIS — F332 Major depressive disorder, recurrent severe without psychotic features: Secondary | ICD-10-CM | POA: Diagnosis not present

## 2022-07-02 DIAGNOSIS — Z7901 Long term (current) use of anticoagulants: Secondary | ICD-10-CM

## 2022-07-02 DIAGNOSIS — Z79899 Other long term (current) drug therapy: Secondary | ICD-10-CM

## 2022-07-02 DIAGNOSIS — Z8673 Personal history of transient ischemic attack (TIA), and cerebral infarction without residual deficits: Secondary | ICD-10-CM

## 2022-07-02 LAB — CBC WITH DIFFERENTIAL/PLATELET
Abs Immature Granulocytes: 0.03 10*3/uL (ref 0.00–0.07)
Basophils Absolute: 0 10*3/uL (ref 0.0–0.1)
Basophils Relative: 1 %
Eosinophils Absolute: 0.2 10*3/uL (ref 0.0–0.5)
Eosinophils Relative: 4 %
HCT: 34.3 % — ABNORMAL LOW (ref 39.0–52.0)
Hemoglobin: 11.7 g/dL — ABNORMAL LOW (ref 13.0–17.0)
Immature Granulocytes: 1 %
Lymphocytes Relative: 20 %
Lymphs Abs: 1 10*3/uL (ref 0.7–4.0)
MCH: 32.2 pg (ref 26.0–34.0)
MCHC: 34.1 g/dL (ref 30.0–36.0)
MCV: 94.5 fL (ref 80.0–100.0)
Monocytes Absolute: 0.7 10*3/uL (ref 0.1–1.0)
Monocytes Relative: 15 %
Neutro Abs: 2.9 10*3/uL (ref 1.7–7.7)
Neutrophils Relative %: 59 %
Platelets: 156 10*3/uL (ref 150–400)
RBC: 3.63 MIL/uL — ABNORMAL LOW (ref 4.22–5.81)
RDW: 12.8 % (ref 11.5–15.5)
WBC: 4.9 10*3/uL (ref 4.0–10.5)
nRBC: 0 % (ref 0.0–0.2)

## 2022-07-02 LAB — COMPREHENSIVE METABOLIC PANEL
ALT: 14 U/L (ref 0–44)
AST: 23 U/L (ref 15–41)
Albumin: 3.8 g/dL (ref 3.5–5.0)
Alkaline Phosphatase: 60 U/L (ref 38–126)
Anion gap: 12 (ref 5–15)
BUN: 31 mg/dL — ABNORMAL HIGH (ref 6–20)
CO2: 31 mmol/L (ref 22–32)
Calcium: 9.9 mg/dL (ref 8.9–10.3)
Chloride: 82 mmol/L — ABNORMAL LOW (ref 98–111)
Creatinine, Ser: 2.36 mg/dL — ABNORMAL HIGH (ref 0.61–1.24)
GFR, Estimated: 32 mL/min — ABNORMAL LOW (ref >60)
Glucose, Bld: 90 mg/dL (ref 70–99)
Potassium: 3.5 mmol/L (ref 3.5–5.1)
Sodium: 125 mmol/L — ABNORMAL LOW (ref 135–145)
Total Bilirubin: 1 mg/dL (ref 0.3–1.2)
Total Protein: 7.4 g/dL (ref 6.5–8.1)

## 2022-07-02 LAB — GLUCOSE, CAPILLARY: Glucose-Capillary: 119 mg/dL — ABNORMAL HIGH (ref 70–99)

## 2022-07-02 LAB — OSMOLALITY: Osmolality: 269 mOsm/kg — ABNORMAL LOW (ref 275–295)

## 2022-07-02 MED ORDER — ONDANSETRON HCL 4 MG PO TABS: 4.0000 mg | ORAL_TABLET | Freq: Four times a day (QID) | ORAL | Status: DC | PRN

## 2022-07-02 MED ORDER — MODAFINIL 100 MG PO TABS
100.0000 mg | ORAL_TABLET | Freq: Every day | ORAL | Status: DC
  Administered 2022-07-02: 100 mg via ORAL
  Filled 2022-07-02: qty 1

## 2022-07-02 MED ORDER — SODIUM CHLORIDE 0.9 % IV SOLN
INTRAVENOUS | Status: DC
Start: 2022-07-02 — End: 2022-07-02

## 2022-07-02 MED ORDER — ONDANSETRON HCL 4 MG/2ML IJ SOLN: 4.0000 mg | Freq: Four times a day (QID) | INTRAMUSCULAR | Status: DC | PRN

## 2022-07-02 MED ORDER — POTASSIUM CHLORIDE 20 MEQ PO PACK
40.0000 meq | PACK | Freq: Once | ORAL | Status: AC
  Administered 2022-07-02: 40 meq via ORAL
  Filled 2022-07-02: qty 2

## 2022-07-02 MED ORDER — FLUOXETINE HCL 20 MG PO CAPS
40.0000 mg | ORAL_CAPSULE | Freq: Every day | ORAL | Status: DC
  Administered 2022-07-03: 40 mg via ORAL
  Filled 2022-07-02: qty 2

## 2022-07-02 MED ORDER — APIXABAN 5 MG PO TABS
5.0000 mg | ORAL_TABLET | Freq: Two times a day (BID) | ORAL | Status: DC
  Administered 2022-07-02 – 2022-07-06 (×8): 5 mg via ORAL
  Filled 2022-07-02 (×8): qty 1

## 2022-07-02 MED ORDER — SODIUM CHLORIDE 0.9 % IV BOLUS
500.0000 mL | Freq: Once | INTRAVENOUS | Status: DC
Start: 2022-07-02 — End: 2022-07-02

## 2022-07-02 MED ORDER — ACETAMINOPHEN 325 MG PO TABS: 650.0000 mg | ORAL_TABLET | Freq: Four times a day (QID) | ORAL | Status: DC | PRN

## 2022-07-02 MED ORDER — MAGNESIUM HYDROXIDE 400 MG/5ML PO SUSP: 30.0000 mL | Freq: Every day | ORAL | Status: DC | PRN

## 2022-07-02 MED ORDER — RISPERIDONE 0.25 MG PO TABS
0.2500 mg | ORAL_TABLET | ORAL | Status: DC
  Filled 2022-07-02: qty 1

## 2022-07-02 MED ORDER — AMIODARONE HCL 200 MG PO TABS
200.0000 mg | ORAL_TABLET | Freq: Every day | ORAL | Status: DC
  Administered 2022-07-03: 200 mg via ORAL
  Filled 2022-07-02: qty 1

## 2022-07-02 MED ORDER — ACETAMINOPHEN 650 MG RE SUPP: 650.0000 mg | Freq: Four times a day (QID) | RECTAL | Status: DC | PRN

## 2022-07-02 MED ORDER — SODIUM CHLORIDE 0.9 % IV SOLN
INTRAVENOUS | Status: DC
Start: 2022-07-02 — End: 2022-07-05

## 2022-07-02 MED ORDER — TRAZODONE HCL 50 MG PO TABS
25.0000 mg | ORAL_TABLET | Freq: Every evening | ORAL | Status: DC | PRN
  Administered 2022-07-02: 25 mg via ORAL
  Filled 2022-07-02: qty 1

## 2022-07-02 MED ORDER — VITAMIN B-12 1000 MCG PO TABS
1000.0000 ug | ORAL_TABLET | Freq: Every day | ORAL | Status: DC
  Administered 2022-07-03 – 2022-07-06 (×4): 1000 ug via ORAL
  Filled 2022-07-02 (×4): qty 1

## 2022-07-02 NOTE — Assessment & Plan Note (Signed)
-   continue his Eliquis for anticoagulation.  Holding amiodarone due to hypotension. 

## 2022-07-02 NOTE — Assessment & Plan Note (Addendum)
-   We are holding off his chlorthalidone, Norvasc and hydralazine as well as Lopressor and Diovan. - We will reintroduce Lopressor, Norvasc and hydralazine with improvement of his blood pressure.

## 2022-07-02 NOTE — Progress Notes (Signed)
Noticed patient started to look lethargic again,  reassessed, pt alert and oriented x4 but is slow to respond at times. BP 70/52, pulse 52, respirations 14. O2 96% on room air. Dr. Marlou Porch and [facility] notified.

## 2022-07-02 NOTE — Significant Event (Addendum)
Rapid Response Event Note   Reason for Call : called RRT for lethargic pt, reported hypotensive   Initial Focused Assessment: laying in bed, arouses, able to conversate, VSS... MAP 82, blood glucose 119... see flowsheets for details.      Interventions: MD had previously d/c BP meds earlier today, pt lethargic, but RN's report getting better now. Pt states "ready to eat dinner and will drink some fluids".   Plan of Care: RN Lowella Bandy to call for further assistance. No new MD orders.    Event Summary: as above  MD Notified: 1640- Dr Marlou Porch via message Call [phone] Arrival [phone] End Time:1630  Charlton Boule A, RN

## 2022-07-02 NOTE — Progress Notes (Signed)
[facility] MD Progress Note  07/02/2022 3:22 PM Andrew Sparks  MRN:  [id number] Subjective: Patient appears to be very sensitive to medications.  Nurses state that he wakes up and does things appropriately but then falls asleep in his chair.  He has a history of sleep apnea and cardiac disease.  I put him on a low-dose of Risperdal but that might still be too much.  His blood pressure is a little bit low but I believe it is chronically low and most of his blood pressure medicines are for arrhythmia.  We will go ahead and make some changes and I think he could benefit from Provigil.  Principal Problem: Major depressive disorder, recurrent severe without psychotic features ([place]) Diagnosis: Principal Problem:   Major depressive disorder, recurrent severe without psychotic features ([place])  Total Time spent with patient: 15 minutes  Past Psychiatric History:  depression, alcohol abuse, anxiety  Past Medical History:  Past Medical History:  Diagnosis Date   A-fib ([place])    Anxiety    CHF (congestive heart failure) (HCC)    Depression    Hypersomnia with sleep apnea    Hypertension    Morbid obesity ([place])    NSTEMI (non-ST elevated myocardial infarction) ([place]) 2005   Obesity hypoventilation syndrome (HCC)    Shortness of breath dyspnea    Sleep apnea    Stroke ([place])    V-tach [facility])     Past Surgical History:  Procedure Laterality Date   LEFT HEART CATH AND CORONARY ANGIOGRAPHY Right 09/10/2018   Procedure: LEFT HEART CATH AND CORONARY ANGIOGRAPHY with possible percutaneous intervention;  Surgeon: Dionisio David, MD;  Location: [place] CV LAB;  Service: Cardiovascular;  Laterality: Right;   TRACHEOSTOMY     Family History:  Family History  Problem Relation Age of Onset   Heart disease Father    Diabetes type II Mother    Breast cancer Mother     Social History:  Social History   Substance and Sexual Activity  Alcohol Use Yes   Alcohol/week: 4.0 standard drinks of  alcohol   Types: 4 Cans of beer per week   Comment: 1/5 of whiskey dailty for last 5 days     Social History   Substance and Sexual Activity  Drug Use Yes   Types: Marijuana    Social History   Socioeconomic History   Marital status: Single    Spouse name: Not on file   Number of children: Not on file   Years of education: Not on file   Highest education level: Not on file  Occupational History   Occupation: disabled  Tobacco Use   Smoking status: Every Day    Packs/day: 0.25    Years: 28.00    Total pack years: 7.00    Types: Cigarettes   Smokeless tobacco: Never  Vaping Use   Vaping Use: Never used  Substance and Sexual Activity   Alcohol use: Yes    Alcohol/week: 4.0 standard drinks of alcohol    Types: 4 Cans of beer per week    Comment: 1/5 of whiskey dailty for last 5 days   Drug use: Yes    Types: Marijuana   Sexual activity: Yes  Other Topics Concern   Not on file  Social History Narrative   Not on file   Social Determinants of Health   Financial Resource Strain: Not on file  Food Insecurity: Not on file  Transportation Needs: Not on file  Physical Activity: Not on  file  Stress: Not on file  Social Connections: Not on file   Additional Social History:                         Sleep: Good  Appetite:  Good  Current Medications: Current Facility-Administered Medications  Medication Dose Route Frequency Provider Last Rate Last Admin   acetaminophen (TYLENOL) tablet 650 mg  650 mg Oral Q6H PRN Charm Rings, NP       alum & mag hydroxide-simeth (MAALOX/MYLANTA) 200-200-20 MG/5ML suspension 30 mL  30 mL Oral Q4H PRN Charm Rings, NP       amiodarone (PACERONE) tablet 200 mg  200 mg Oral Daily Charm Rings, NP   200 mg at 07/02/22 0901   apixaban (ELIQUIS) tablet 5 mg  5 mg Oral BID Charm Rings, NP   5 mg at 07/02/22 0901   cyanocobalamin (VITAMIN B12) tablet 1,000 mcg  1,000 mcg Oral Daily Charm Rings, NP   1,000 mcg at  07/02/22 0865   folic acid (FOLVITE) tablet 1 mg  1 mg Oral Daily Charm Rings, NP   1 mg at 07/02/22 7846   gabapentin (NEURONTIN) capsule 400 mg  400 mg Oral TID Sarina Ill, DO   400 mg at 07/02/22 9629   LORazepam (ATIVAN) tablet 1-4 mg  1-4 mg Oral Q1H PRN Charm Rings, NP   1 mg at 06/30/22 2107   Or   LORazepam (ATIVAN) injection 1-4 mg  1-4 mg Intravenous Q1H PRN Charm Rings, NP       magnesium hydroxide (MILK OF MAGNESIA) suspension 30 mL  30 mL Oral Daily PRN Charm Rings, NP       modafinil (PROVIGIL) tablet 100 mg  100 mg Oral QPC breakfast Sarina Ill, DO   100 mg at 07/02/22 1500   multivitamin with minerals tablet 1 tablet  1 tablet Oral Daily Charm Rings, NP   1 tablet at 07/02/22 5284   nicotine (NICODERM CQ - dosed in mg/24 hours) patch 14 mg  14 mg Transdermal Daily Sarina Ill, DO   14 mg at 07/02/22 0908   QUEtiapine (SEROQUEL) tablet 100 mg  100 mg Oral QHS Sarina Ill, DO   100 mg at 07/01/22 2158   risperiDONE (RISPERDAL) tablet 0.25 mg  0.25 mg Oral BH-[id number] Sarina Ill, DO       thiamine (VITAMIN B1) tablet 100 mg  100 mg Oral Daily Charm Rings, NP   100 mg at 07/02/22 1324   Or   thiamine (VITAMIN B1) injection 100 mg  100 mg Intravenous Daily Charm Rings, NP       venlafaxine XR (EFFEXOR-XR) 24 hr capsule 75 mg  75 mg Oral Q breakfast Sarina Ill, DO   75 mg at 07/02/22 0900    Lab Results: No results found for this or any previous visit (from the past 48 hour(s)).  Blood Alcohol level:  Lab Results  Component Value Date   ETH <10 06/30/2022    Metabolic Disorder Labs: Lab Results  Component Value Date   HGBA1C 5.1 04/21/2019   MPG 99.67 04/21/2019   MPG 142.72 03/26/2018   No results found for: "PROLACTIN" Lab Results  Component Value Date   CHOL 191 04/21/2020   TRIG 47 04/21/2020   HDL 82 04/21/2020   CHOLHDL 2.3 04/21/2020   VLDL 9 04/21/2020    LDLCALC 100 (  H) 04/21/2020   LDLCALC 64 04/21/2019    Physical Findings: AIMS:  , ,  ,  ,    CIWA:  CIWA-Ar Total: 3 COWS:     Musculoskeletal: Strength & Muscle Tone: within normal limits Gait & Station: normal Patient leans: N/A  Psychiatric Specialty Exam:  Presentation  General Appearance: No data recorded Eye Contact:No data recorded Speech:No data recorded Speech Volume:No data recorded Handedness:No data recorded  Mood and Affect  Mood:No data recorded Affect:No data recorded  Thought Process  Thought Processes:No data recorded Descriptions of Associations:No data recorded Orientation:No data recorded Thought Content:No data recorded History of Schizophrenia/Schizoaffective disorder:No  Duration of Psychotic Symptoms:No data recorded Hallucinations:No data recorded Ideas of Reference:No data recorded Suicidal Thoughts:No data recorded Homicidal Thoughts:No data recorded  Sensorium  Memory:No data recorded Judgment:No data recorded Insight:No data recorded  Executive Functions  Concentration:No data recorded Attention Span:No data recorded Recall:No data recorded Fund of Knowledge:No data recorded Language:No data recorded  Psychomotor Activity  Psychomotor Activity:No data recorded  Assets  Assets:No data recorded  Sleep  Sleep:No data recorded   Physical Exam: Physical Exam ROS Blood pressure (!) 70/52, pulse (!) 55, temperature (!) 97.5 F (36.4 C), resp. rate 19, height 5\' 9"  (1.753 m), weight 98.4 kg, SpO2 99 %. Body mass index is 32.05 kg/m.   Treatment Plan Summary: Daily contact with patient to assess and evaluate symptoms and progress in treatment, Medication management, and Plan discontinue Norvasc and metoprolol and start Provigil 100 mg in the morning.  Decrease Risperdal to 0.25 mg twice a day.  His biggest complaint was racing thoughts.  , DO 07/02/2022, 3:22 PM

## 2022-07-02 NOTE — Assessment & Plan Note (Signed)
-   Likely prerenal versus ATN due to hypotension Continue IV fluids and avoid nephrotoxic medications  Lab Results  Component Value Date   CREATININE 2.18 (H) 07/03/2022   CREATININE 2.36 (H) 07/02/2022   CREATININE 1.54 (H) 06/30/2022

## 2022-07-02 NOTE — Assessment & Plan Note (Signed)
-   The patient will be admitted to an observation medical telemetry bed. - He will be given a bolus of 1 L bolus of IV normal saline followed by 100 mL/h. - We will follow his BMP. - We will hold off antihypertensives.

## 2022-07-02 NOTE — Progress Notes (Signed)
1951: Pt alert and oriented. Pt denies experiencing any anxiety/depression at this time. Pt denies experiencing any pain at this time. Pt denies experiencing any SI/HI, or AVH at this time. 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  2300: Pt  noted that he doesn't want to use his ordered O2@3L  HS while asleep d/t requiring 1:1 staff present w/ O2 use. On call psych provider R. Dixon notified.   2330: O2 sat @98  RA.  0100: O2 sat@83  RA. Pt advised of decrease in O2 sat, placed on O2@3L  via [place] w/ 1:1 implemented. O2 sat > 95% on O2@3L .  A: Scheduled medications administered as prescribed. Metoprolol 25mg  po HS held per psych on call d/t B/P 93/59, 69. Support and encouragement provided. Routine safety checks conducted q15 minutes.   R: No adverse drug reactions noted. Pt verbally contracts for safety at this time. Pt complaint with medications. Pt interacts appropriately with others on the unit. Pt remains safe at this time. Will continue to monitor.

## 2022-07-02 NOTE — Assessment & Plan Note (Signed)
-   This could be acute on chronic hyponatremia. - We will obtain hyponatremia work-up. - He will be hydrated with IV normal saline and will follow his BMP.

## 2022-07-02 NOTE — Progress Notes (Addendum)
Patient sitting up in chair in dayroom, patient is sleeping on and off, BP 70/52 manually in left arm, pulse 55, O2 100% on room air. Pt is slouched over in the chair, he responds when he is spoken to but goes right back to sleep.... Dr. Marlou Porch notified, states he will put in orders for Provigil,d/c norvasc and d/c metoprolol. RR even and non-labored. Skin cool to touch, peripheral pulses 3+, no abnormal breath sounds noted. NO abnormal heart sounds noted. Pt remains in line of sight of undersigned. Will continue to monitor and update provider as needed.

## 2022-07-02 NOTE — Progress Notes (Signed)
Patient arrived on unit.. Alert, oriented x4. No distress noted, no complaints voiced. Patient settled into room. Staff/safety sitter at bedside. Will continue to monitor according to orders and plan of care.

## 2022-07-02 NOTE — Progress Notes (Signed)
Patient sitting in dayroom with no distress. Talking to staff and peers, taking in PO fluids and ate 100% of his dinner.

## 2022-07-02 NOTE — Progress Notes (Signed)
Rapid response called at 1614 due to patient continuing to be lethargic and difficult to arouse. Team responded, he was able to answer all questions, alert and oriented x4, stated, "I think its all this medication I've been taking." BP rechecked- see flowsheet for details. BG 119 when checked. Dr. Marlou Porch notified. Seroquel will be discontinued for tonight and patient refused his dinner medications. Pt is currently sitting up in the chair in the dayroom eating dinner with no distress noted.

## 2022-07-02 NOTE — Assessment & Plan Note (Signed)
-   continue statin therapy. 

## 2022-07-02 NOTE — Assessment & Plan Note (Signed)
continue vitamin B12. ?

## 2022-07-02 NOTE — Progress Notes (Signed)
Order is in per Dr. Marlou Porch for hospitalist to come see patient. Oncoming shift notified. Pt is currently drinking fluids sitting in the dayroom.

## 2022-07-02 NOTE — Progress Notes (Signed)
Resumed care of patient at 6. V/S 75/55, 63, 14, 97.7, 99%RA. Pt appears asymptomatic, Alert & Oriented x 4. Pt seen and examined by hospital internist as ordered. Recommendation for transfer to medical observation unit for overnight observation. Transfer recommendation explained to patient w/ verbalized understanding noted by patient.

## 2022-07-02 NOTE — Plan of Care (Signed)
Patient was asleep when writer arrived to the unit, with a 1:1 at bedside due to oxygen use during the night. Patient woke up around 0815 and came to the day room to take his medications and eat breakfast. Patient is noted to have an abundant amount of saliva coming out of his mouth while he sits in the dayroom. He is noted to fall asleep at the table often. He has a diagnosis of sleep apnea but says he did not wear a CPAP or bipap at home. Patient informed this writer this morning, "I feel drunk, I feel out of it, I'm on too many meds." Writer redirected pt and explained the medications he was on, but will let MD know later today upon assessment. Pt declines SI/HI, denies AVH. Denies any pain at present. Ate his meal with no assistance. Patient is noted to have a non-productive cough, pt states he has had the cough for five years and denies that is new, he states he used to be a smoker. Will continue q12min checks and continue to provide verbal encouragement as needed.   Problem: Education: Goal: Knowledge of General Education information will improve Description: Including pain rating scale, medication(s)/side effects and non-pharmacologic comfort measures Outcome: Progressing   Problem: Health Behavior/Discharge Planning: Goal: Ability to manage health-related needs will improve Outcome: Progressing   Problem: Activity: Goal: Risk for activity intolerance will decrease Outcome: Progressing   Problem: Nutrition: Goal: Adequate nutrition will be maintained Outcome: Progressing   Problem: Coping: Goal: Level of anxiety will decrease Outcome: Progressing
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
## 2022-07-03 DIAGNOSIS — F332 Major depressive disorder, recurrent severe without psychotic features: Secondary | ICD-10-CM

## 2022-07-03 DIAGNOSIS — E871 Hypo-osmolality and hyponatremia: Secondary | ICD-10-CM | POA: Diagnosis not present

## 2022-07-03 DIAGNOSIS — R69 Illness, unspecified: Secondary | ICD-10-CM | POA: Diagnosis not present

## 2022-07-03 DIAGNOSIS — N179 Acute kidney failure, unspecified: Secondary | ICD-10-CM | POA: Diagnosis not present

## 2022-07-03 DIAGNOSIS — I959 Hypotension, unspecified: Secondary | ICD-10-CM | POA: Diagnosis not present

## 2022-07-03 LAB — CBC
HCT: 33.3 % — ABNORMAL LOW (ref 39.0–52.0)
Hemoglobin: 11.2 g/dL — ABNORMAL LOW (ref 13.0–17.0)
MCH: 31.9 pg (ref 26.0–34.0)
MCHC: 33.6 g/dL (ref 30.0–36.0)
MCV: 94.9 fL (ref 80.0–100.0)
Platelets: 154 10*3/uL (ref 150–400)
RBC: 3.51 MIL/uL — ABNORMAL LOW (ref 4.22–5.81)
RDW: 13.1 % (ref 11.5–15.5)
WBC: 4.7 10*3/uL (ref 4.0–10.5)
nRBC: 0 % (ref 0.0–0.2)

## 2022-07-03 LAB — NA AND K (SODIUM & POTASSIUM), RAND UR
Potassium Urine: 18 mmol/L
Sodium, Ur: <10 mmol/L

## 2022-07-03 LAB — BASIC METABOLIC PANEL
Anion gap: 10 (ref 5–15)
BUN: 32 mg/dL — ABNORMAL HIGH (ref 6–20)
CO2: 29 mmol/L (ref 22–32)
Calcium: 9.1 mg/dL (ref 8.9–10.3)
Chloride: 87 mmol/L — ABNORMAL LOW (ref 98–111)
Creatinine, Ser: 2.18 mg/dL — ABNORMAL HIGH (ref 0.61–1.24)
GFR, Estimated: 35 mL/min — ABNORMAL LOW (ref >60)
Glucose, Bld: 103 mg/dL — ABNORMAL HIGH (ref 70–99)
Potassium: 4 mmol/L (ref 3.5–5.1)
Sodium: 126 mmol/L — ABNORMAL LOW (ref 135–145)

## 2022-07-03 LAB — HIV ANTIBODY (ROUTINE TESTING W REFLEX): HIV Screen 4th Generation wRfx: NONREACTIVE

## 2022-07-03 LAB — MAGNESIUM: Magnesium: 1.6 mg/dL — ABNORMAL LOW (ref 1.7–2.4)

## 2022-07-03 LAB — OSMOLALITY, URINE: Osmolality, Ur: 226 mOsm/kg — ABNORMAL LOW (ref 300–900)

## 2022-07-03 MED ORDER — THIAMINE HCL 100 MG/ML IJ SOLN: 100.0000 mg | Freq: Every day | INTRAMUSCULAR | Status: DC

## 2022-07-03 MED ORDER — FOLIC ACID 1 MG PO TABS
1.0000 mg | ORAL_TABLET | Freq: Every day | ORAL | Status: DC
  Administered 2022-07-03 – 2022-07-06 (×4): 1 mg via ORAL
  Filled 2022-07-03 (×4): qty 1

## 2022-07-03 MED ORDER — SENNOSIDES-DOCUSATE SODIUM 8.6-50 MG PO TABS
2.0000 | ORAL_TABLET | Freq: Two times a day (BID) | ORAL | Status: DC
  Administered 2022-07-03 – 2022-07-06 (×7): 2 via ORAL
  Filled 2022-07-03 (×7): qty 2

## 2022-07-03 MED ORDER — NICOTINE 14 MG/24HR TD PT24
14.0000 mg | MEDICATED_PATCH | Freq: Every day | TRANSDERMAL | Status: DC
  Administered 2022-07-03 – 2022-07-06 (×4): 14 mg via TRANSDERMAL
  Filled 2022-07-03 (×4): qty 1

## 2022-07-03 MED ORDER — GABAPENTIN 400 MG PO CAPS
400.0000 mg | ORAL_CAPSULE | Freq: Three times a day (TID) | ORAL | Status: DC
  Administered 2022-07-03 – 2022-07-04 (×3): 400 mg via ORAL
  Filled 2022-07-03 (×3): qty 1

## 2022-07-03 MED ORDER — MAGNESIUM SULFATE 2 GM/50ML IV SOLN
2.0000 g | Freq: Once | INTRAVENOUS | Status: AC
  Administered 2022-07-03: 2 g via INTRAVENOUS
  Filled 2022-07-03: qty 50

## 2022-07-03 MED ORDER — THIAMINE HCL 100 MG PO TABS
100.0000 mg | ORAL_TABLET | Freq: Every day | ORAL | Status: DC
  Administered 2022-07-03 – 2022-07-06 (×4): 100 mg via ORAL
  Filled 2022-07-03 (×4): qty 1

## 2022-07-03 MED ORDER — LORAZEPAM 1 MG PO TABS
1.0000 mg | ORAL_TABLET | ORAL | Status: DC | PRN
  Administered 2022-07-03 (×2): 1 mg via ORAL
  Filled 2022-07-03 (×2): qty 1

## 2022-07-03 MED ORDER — QUETIAPINE FUMARATE 25 MG PO TABS
100.0000 mg | ORAL_TABLET | Freq: Every day | ORAL | Status: DC
  Administered 2022-07-03 – 2022-07-05 (×3): 100 mg via ORAL
  Filled 2022-07-03 (×3): qty 4

## 2022-07-03 MED ORDER — VENLAFAXINE HCL ER 75 MG PO CP24
75.0000 mg | ORAL_CAPSULE | Freq: Every day | ORAL | Status: DC
  Administered 2022-07-03 – 2022-07-06 (×4): 75 mg via ORAL
  Filled 2022-07-03 (×4): qty 1

## 2022-07-03 MED ORDER — LORAZEPAM 2 MG/ML IJ SOLN: 1.0000 mg | INTRAMUSCULAR | Status: DC | PRN

## 2022-07-03 MED ORDER — ADULT MULTIVITAMIN W/MINERALS CH
1.0000 | ORAL_TABLET | Freq: Every day | ORAL | Status: DC
  Administered 2022-07-03 – 2022-07-06 (×4): 1 via ORAL
  Filled 2022-07-03 (×4): qty 1

## 2022-07-03 MED ORDER — RISPERIDONE 0.5 MG PO TABS
0.5000 mg | ORAL_TABLET | Freq: Two times a day (BID) | ORAL | Status: DC
  Administered 2022-07-03 – 2022-07-04 (×3): 0.5 mg via ORAL
  Filled 2022-07-03 (×3): qty 1

## 2022-07-03 NOTE — Care Management Obs Status (Signed)
MEDICARE OBSERVATION STATUS NOTIFICATION   Patient Details  Name: Andrew Sparks MRN: [id number] Date of Birth: 11/21/1966   Medicare Observation Status Notification Given:  Yes    Margarito Liner, LCSW 07/03/2022, 2:14 PM

## 2022-07-03 NOTE — Consult Note (Signed)
[facility] Consult   Reason for Consult:  Depression Referring Physician:  Manuella Ghazi Patient Identification: Andrew Sparks MRN:  [id number] Principal Diagnosis: Major depressive disorder, recurrent severe without psychotic features ([place]) Diagnosis:  Principal Problem:   Major depressive disorder, recurrent severe without psychotic features ([place]) Active Problems:   Hyponatremia   Alcohol abuse   AKI (acute kidney injury) ([place])   Essential hypertension   Paroxysmal atrial fibrillation ([place])   Vitamin B12 deficiency   Hypotension   Dyslipidemia   Total Time spent with patient: 30 minutes  Subjective:   Andrew Sparks is a 56 y.o. male patient admitted with hyponatremia.  HPI:  Patient re-assessed on the medical floor, where he was transferred to from the psych unit d/t hypotension, hyponatremia.   On evaluation, patient is very sleepy, but becomes alert with encouragement. Patient states that he wants to go back to the inpatient psychiatric unit when he is medically cleared, as he continues to be depressed and states he has thoughts of running into traffic. He states that he would like to participate  more in groups on the unit  that will help him with his depression and anxiety.  Patient is not medically cleared at this time. He can be considered for psychiatric inpatient if there is a bed when he is medically cleared.    Past Psychiatric History: see previous  Risk to Self:   Risk to Others:   Prior Inpatient Therapy:   Prior Outpatient Therapy:    Past Medical History:  Past Medical History:  Diagnosis Date   A-fib ([place])    Anxiety    CHF (congestive heart failure) ([place])    Depression    Hypersomnia with sleep apnea    Hypertension    Morbid obesity ([place])    NSTEMI (non-ST elevated myocardial infarction) ([place]) 2005   Obesity hypoventilation syndrome (HCC)    Shortness of breath dyspnea    Sleep apnea    Stroke ([place])    V-tach [facility])      Past Surgical History:  Procedure Laterality Date   LEFT HEART CATH AND CORONARY ANGIOGRAPHY Right 09/10/2018   Procedure: LEFT HEART CATH AND CORONARY ANGIOGRAPHY with possible percutaneous intervention;  Surgeon: Dionisio David, MD;  Location: [facility];  Service: Cardiovascular;  Laterality: Right;   TRACHEOSTOMY     Family History:  Family History  Problem Relation Age of Onset   Heart disease Father    Diabetes type II Mother    Breast cancer Mother    Family Psychiatric  History: see previous Social History:  Social History   Substance and Sexual Activity  Alcohol Use Yes   Alcohol/week: 4.0 standard drinks of alcohol   Types: 4 Cans of beer per week   Comment: 1/5 of whiskey dailty for last 5 days     Social History   Substance and Sexual Activity  Drug Use Yes   Types: Marijuana    Social History   Socioeconomic History   Marital status: Single    Spouse name: Not on file   Number of children: Not on file   Years of education: Not on file   Highest education level: Not on file  Occupational History   Occupation: disabled  Tobacco Use   Smoking status: Every Day    Packs/day: 0.25    Years: 28.00    Total pack years: 7.00    Types: Cigarettes   Smokeless tobacco: Never  Vaping Use   Vaping  Use: Never used  Substance and Sexual Activity   Alcohol use: Yes    Alcohol/week: 4.0 standard drinks of alcohol    Types: 4 Cans of beer per week    Comment: 1/5 of whiskey dailty for last 5 days   Drug use: Yes    Types: Marijuana   Sexual activity: Yes  Other Topics Concern   Not on file  Social History Narrative   Not on file   Social Determinants of Health   Financial Resource Strain: Not on file  Food Insecurity: Not on file  Transportation Needs: Not on file  Physical Activity: Not on file  Stress: Not on file  Social Connections: Not on file   Additional Social History:    Allergies:  No Known Allergies  Labs:  Results for  orders placed or performed during the hospital encounter of 07/02/22 (from the past 48 hour(s))  Osmolality     Status: Abnormal   Collection Time: 07/02/22  8:35 PM  Result Value Ref Range   Osmolality 269 (L) 275 - 295 mOsm/kg    Comment: Performed at [facility], [address] [place], [place] [address]  Basic metabolic panel     Status: Abnormal   Collection Time: 07/03/22  6:21 AM  Result Value Ref Range   Sodium 126 (L) 135 - 145 mmol/L   Potassium 4.0 3.5 - 5.1 mmol/L   Chloride 87 (L) 98 - 111 mmol/L   CO2 29 22 - 32 mmol/L   Glucose, Bld 103 (H) 70 - 99 mg/dL    Comment: Glucose reference range applies only to samples taken after fasting for at least 8 hours.   BUN 32 (H) 6 - 20 mg/dL   Creatinine, Ser 8.46 (H) 0.61 - 1.24 mg/dL   Calcium 9.1 8.9 - 96.2 mg/dL   GFR, Estimated 35 (L) >60 mL/min    Comment: (NOTE) Calculated using the CKD-EPI Creatinine Equation (2021)    Anion gap 10 5 - 15    Comment: Performed at [facility], [address] [place], [place] [address]  CBC     Status: Abnormal   Collection Time: 07/03/22  6:21 AM  Result Value Ref Range   WBC 4.7 4.0 - 10.5 K/uL   RBC 3.51 (L) 4.22 - 5.81 MIL/uL   Hemoglobin 11.2 (L) 13.0 - 17.0 g/dL   HCT 13.2 (L) 44.0 - 10.2 %   MCV 94.9 80.0 - 100.0 fL   MCH 31.9 26.0 - 34.0 pg   MCHC 33.6 30.0 - 36.0 g/dL   RDW 72.5 36.6 - 44.0 %   Platelets 154 150 - 400 K/uL   nRBC 0.0 0.0 - 0.2 %    Comment: Performed at [facility], [address] [place], [place] [address]  HIV Antibody (routine testing w rflx)     Status: None   Collection Time: 07/03/22  6:21 AM  Result Value Ref Range   HIV Screen 4th Generation wRfx Non Reactive Non Reactive    Comment: Performed at [facility], [address], [place], [place] [address]  Magnesium     Status: Abnormal   Collection Time: 07/03/22  6:21 AM  Result Value Ref Range   Magnesium 1.6 (L) 1.7 - 2.4 mg/dL    Comment: Performed at  [facility], [address] [place], [place] [address]  Na and K (sodium & potassium), rand urine     Status: None   Collection Time: 07/03/22  6:45 AM  Result Value Ref Range   Sodium, Ur <10 mmol/L   Potassium Urine 18 mmol/L    Comment: Performed at [facility], [address] [place], [place] [address]  Osmolality, urine     Status: Abnormal   Collection Time: 07/03/22  6:45 AM  Result Value Ref Range   Osmolality, Ur 226 (L) 300 - 900 mOsm/kg    Comment: REPEATED TO VERIFY MW Performed at [facility], [address], [place], [place] [address]     Current Facility-Administered Medications  Medication Dose Route Frequency Provider Last Rate Last Admin   0.9 %  sodium chloride infusion   Intravenous Continuous Mansy, Jan A, MD 100 mL/hr at 07/03/22 1552 Infusion Verify at 07/03/22 1552   acetaminophen (TYLENOL) tablet 650 mg  650 mg Oral Q6H PRN Mansy, Jan A, MD       Or   acetaminophen (TYLENOL) suppository 650 mg  650 mg Rectal Q6H PRN Mansy, Jan A, MD       apixaban Arne Cleveland) tablet 5 mg  5 mg Oral BID Mansy, Jan A, MD   5 mg at 07/03/22 [id number]   cyanocobalamin (VITAMIN B12) tablet 1,000 mcg  1,000 mcg Oral Daily Mansy, Jan A, MD   1,000 mcg at [id number]   folic acid (FOLVITE) tablet 1 mg  1 mg Oral Daily Max Sane, MD   1 mg at 07/03/22 1159   gabapentin (NEURONTIN) capsule 400 mg  400 mg Oral TID Max Sane, MD   400 mg at 07/03/22 1354   LORazepam (ATIVAN) tablet 1-4 mg  1-4 mg Oral Q1H PRN Max Sane, MD   1 mg at 07/03/22 1159   Or   LORazepam (ATIVAN) injection 1-4 mg  1-4 mg Intravenous Q1H PRN Max Sane, MD       magnesium hydroxide (MILK OF MAGNESIA) suspension 30 mL  30 mL Oral Daily PRN Mansy, Jan A, MD       multivitamin with minerals tablet 1 tablet  1 tablet Oral Daily Max Sane, MD   1 tablet at 07/03/22 1159   nicotine (NICODERM CQ - dosed in mg/24 hours) patch 14 mg  14 mg Transdermal Daily Max Sane, MD   14 mg at  07/03/22 1117   ondansetron (ZOFRAN) tablet 4 mg  4 mg Oral Q6H PRN Mansy, Jan A, MD       Or   ondansetron [facility]) injection 4 mg  4 mg Intravenous Q6H PRN Mansy, Jan A, MD       QUEtiapine (SEROQUEL) tablet 100 mg  100 mg Oral QHS Manuella Ghazi, Vipul, MD       risperiDONE (RISPERDAL) tablet 0.5 mg  0.5 mg Oral BID Max Sane, MD   0.5 mg at 07/03/22 1530   senna-docusate (Senokot-S) tablet 2 tablet  2 tablet Oral BID Max Sane, MD   2 tablet at 07/03/22 1158   thiamine (VITAMIN B1) tablet 100 mg  100 mg Oral Daily Max Sane, MD   100 mg at 07/03/22 1159   Or   thiamine (VITAMIN B1) injection 100 mg  100 mg Intravenous Daily Max Sane, MD       traZODone (DESYREL) tablet 25 mg  25 mg Oral QHS PRN Mansy, Jan A, MD   25 mg at 07/02/22 2318   venlafaxine XR (EFFEXOR-XR) 24 hr capsule 75 mg  75 mg Oral Q breakfast Max Sane, MD   75 mg at 07/03/22 1530    Musculoskeletal: Strength & Muscle  Tone: decreased Gait & Station:  did not observe Patient leans: N/A    Psychiatric Specialty Exam:  Presentation  General Appearance: Appropriate for Environment  Eye Contact:Fair  Speech:Clear and Coherent; Slow (sleepy)  Speech Volume:Decreased  Handedness:No data recorded  Mood and Affect  Mood:Depressed  Affect:Congruent   Thought Process  Thought Processes:Coherent  Descriptions of Associations:Intact  Orientation:Full (Time, Place and Person)  Thought Content:Logical  History of Schizophrenia/Schizoaffective disorder:No  Duration of Psychotic Symptoms:No data recorded Hallucinations:Hallucinations: None  Ideas of Reference:None  Suicidal Thoughts:Suicidal Thoughts: Yes, Passive SI Passive Intent and/or Plan: With Plan (Run into traffic)  Homicidal Thoughts:Homicidal Thoughts: No   Sensorium  Memory:Immediate Fair  Judgment:Fair  Insight:Fair   Executive Functions  Concentration:Fair  Attention Span:Fair  Recall:Fair  Fund of  Knowledge:Fair  Language:Fair   Psychomotor Activity  Psychomotor Activity:Psychomotor Activity: Decreased   Assets  Assets:Desire for Improvement; Housing; Health and safety inspector; Resilience   Sleep  Sleep:Sleep: Good   Physical Exam: Physical Exam Vitals and nursing note reviewed.  Constitutional:      Appearance: He is ill-appearing (sleepy).  HENT:     Head: Normocephalic.     Nose: No congestion or rhinorrhea.  Eyes:     General:        Right eye: No discharge.  Cardiovascular:     Rate and Rhythm: Normal rate.  Pulmonary:     Effort: Pulmonary effort is normal.  Musculoskeletal:        General: Normal range of motion.  Neurological:     Mental Status: He is oriented to person, place, and time.  Psychiatric:        Attention and Perception: Attention normal.        Mood and Affect: Mood is depressed.        Speech: Speech normal.        Behavior: Behavior normal.        Thought Content: Thought content is not paranoid or delusional. Thought content includes suicidal ideation. Thought content does not include homicidal ideation. Thought content does not include suicidal plan.        Cognition and Memory: Cognition normal.        Judgment: Judgment normal.    Review of Systems  Psychiatric/Behavioral:  Positive for depression.    Blood pressure 109/81, pulse 70, temperature 98 F (36.7 C), resp. rate 16, height 5\' 9"  (1.753 m), weight 98 kg, SpO2 100 %. Body mass index is 31.91 kg/m.  Treatment Plan Summary: Psychiatric medications were re-started by attending. Patient states that he still has thoughts of "running out into traffic" and would like to go back downstairs to the psychiatric unit when medically cleared. Reviewed with Dr. and Dr. Sherryll Burger via secure chat  Disposition: Recommend psychiatric Inpatient admission when medically cleared.  Marlou Porch, NP 07/03/2022 5:01 PM

## 2022-07-03 NOTE — Assessment & Plan Note (Signed)
-   CIWA protocol 

## 2022-07-03 NOTE — Significant Event (Signed)
Rapid Response Event Note  Reason for Call :  hypotension  Initial Focused Assessment: On arrival patient observed sitting in a chair, patient warm and dry able to answer questions, being assessed by NP K.Foust.         Interventions:  20g IV placed to LFA, and 1L NS fluid bolus started per NP order.   Plan of Care:  Head CT ordered, Hospitalist consulted, plans for transfer to higher level of care.patient transferred to room 224    Event Summary:   MD Notified:  Call Time: Arrival Time: End Time:  Skip Estimable, RN

## 2022-07-03 NOTE — Hospital Course (Signed)
56 y.o. male with medical history significant for paroxysmal atrial fibrillation, on Eliquis, essential hypertension, coronary artery disease, obstructive sleep apnea not on home CPAP, CVA, hyponatremia, anxiety and depression with recent intermittent suicidal thoughts for which she was admitted to [facility] psych unit on 7/28.  He has a history of alcohol abuse and therefore was placed on CIWA protocol and has been given Ativan as well as Risperdal.  His Prozac was stopped and he was started on Effexor XR in a.m. and 0.5 mg of p.o. Risperdal twice daily as well as Seroquel 100 mg p.o. at bedtime.  Patient became somnolent throughout the day and in the afternoon became hypotensive so was transferred to medical floor  7/31: Patient awake and alert.  Blood pressure improving 8/1: Risperdal discontinued.  Decrease dose of gabapentin and added Wellbutrin per psychiatry.  To be discharged home on the medical floor once stable per psychiatry 8/2: Ongoing IV hydration for 1 more day for possible discharge tomorrow

## 2022-07-03 NOTE — TOC Initial Note (Signed)
Transition of Care [facility]) - Initial/Assessment Note    Patient Details  Name: Andrew Sparks MRN: [id number] Date of Birth: June 04, 1966  Transition [facility]) CM/SW Contact:    Candie Chroman, LCSW Phone Number: 07/03/2022, 2:18 PM  Clinical Narrative:    CSW met with patient. Sitter at bedside. CSW introduced role and inquired about interest in SA resources. Patient is agreeable. Printed and put on his chart to go home with him once discharged. Patient confirmed he is on 3 L chronic oxygen through [employer]. At discharge, his friend will transport him home and bring his oxygen for the ride. No further concerns. CSW encouraged patient to contact CSW as needed. CSW will continue to follow patient for support and facilitate discharge once stable.              Expected Discharge Plan: Psychiatric Hospital Barriers to Discharge: Continued Medical Work up   Patient Goals and CMS Choice        Expected Discharge Plan and Services Expected Discharge Plan: Psychiatric Hospital     Post Acute Care Choice: NA Living arrangements for the past 2 months: Single Family Home                                      Prior Living Arrangements/Services Living arrangements for the past 2 months: Single Family Home   Patient language and need for interpreter reviewed:: Yes Do you feel safe going back to the place where you live?: Yes      Need for Family Participation in Patient Care: Yes (Comment)   Current home services: DME Criminal Activity/Legal Involvement Pertinent to Current Situation/Hospitalization: No - Comment as needed  Activities of Daily Living Home Assistive Devices/Equipment: None ADL Screening (condition at time of admission) Patient's cognitive ability adequate to safely complete daily activities?: Yes Is the patient deaf or have difficulty hearing?: No Does the patient have difficulty seeing, even when wearing glasses/contacts?: No Does the patient have  difficulty concentrating, remembering, or making decisions?: No Patient able to express need for assistance with ADLs?: Yes Does the patient have difficulty dressing or bathing?: No Independently performs ADLs?: Yes (appropriate for developmental age) Does the patient have difficulty walking or climbing stairs?: No Weakness of Legs: None Weakness of Arms/Hands: None  Permission Sought/Granted                  Emotional Assessment Appearance:: Appears stated age Attitude/Demeanor/Rapport: Engaged, Gracious Affect (typically observed): Accepting, Apprehensive, Calm, Pleasant Orientation: : Oriented to Self, Oriented to Place, Oriented to  Time, Oriented to Situation Alcohol / Substance Use: Not Applicable Psych Involvement: Yes (comment)  Admission diagnosis:  Hypotension [I95.9] Patient Active Problem List   Diagnosis Date Noted   Hypotension 07/02/2022   Dyslipidemia 07/02/2022   Major depressive disorder, recurrent severe without psychotic features ([place]) 06/30/2022   Paroxysmal atrial fibrillation ([place])    Vitamin B12 deficiency    AKI (acute kidney injury) ([place]) 03/28/2021   Essential hypertension 03/28/2021   Hyponatremia 04/20/2020   Atrial fibrillation, chronic ([place])    COPD with acute exacerbation ([place])    Alcohol abuse    Tobacco abuse    CHF (congestive heart failure) ([place]) 07/17/2017   Panic disorder 01/18/2017   Obstructive sleep apnea syndrome 02/01/2016   PCP:  Jodi Marble, MD Pharmacy:   [facility], [place]  [place] [address] Phone: [phone] Fax: [phone]  [facility] [address] [place]) [place] [address] Phone: [phone] Fax: [phone]     Social Determinants of Health (SDOH) Interventions    Readmission Risk Interventions     No data to display

## 2022-07-03 NOTE — Assessment & Plan Note (Addendum)
Resume his psychiatry medications including trazodone, Seroquel, Risperdal, Effexor.  Appreciate psychiatry evaluation Plan is to discharge him back to inpatient psychiatry unit as he is still suicidal

## 2022-07-03 NOTE — BHH Suicide Risk Assessment (Signed)
[facility] Discharge Suicide Risk Assessment   Principal Problem: Major depressive disorder, recurrent severe without psychotic features (HCC) Discharge Diagnoses: Principal Problem:   Major depressive disorder, recurrent severe without psychotic features (HCC)   Total Time spent with patient: 1 hour  Musculoskeletal: Strength & Muscle Tone: within normal limits Gait & Station: unable to stand Patient leans: N/A  Psychiatric Specialty Exam  Presentation  General Appearance: No data recorded Eye Contact:No data recorded Speech:No data recorded Speech Volume:No data recorded Handedness:No data recorded  Mood and Affect  Mood:No data recorded Duration of Depression Symptoms: Greater than two weeks  Affect:No data recorded  Thought Process  Thought Processes:No data recorded Descriptions of Associations:No data recorded Orientation:No data recorded Thought Content:No data recorded History of Schizophrenia/Schizoaffective disorder:No  Duration of Psychotic Symptoms:No data recorded Hallucinations:No data recorded Ideas of Reference:No data recorded Suicidal Thoughts:No data recorded Homicidal Thoughts:No data recorded  Sensorium  Memory:No data recorded Judgment:No data recorded Insight:No data recorded  Executive Functions  Concentration:No data recorded Attention Span:No data recorded Recall:No data recorded Fund of Knowledge:No data recorded Language:No data recorded  Psychomotor Activity  Psychomotor Activity:No data recorded  Assets  Assets:No data recorded  Sleep  Sleep:No data recorded  Physical Exam: Physical Exam ROS Blood pressure 94/70, pulse 62, temperature 98.3 F (36.8 C), temperature source Oral, resp. rate 18, height 5\' 9"  (1.753 m), weight 98.4 kg, SpO2 100 %. Body mass index is 32.05 kg/m.  Mental Status Per Nursing Assessment::   On Admission:  Self-harm thoughts  Demographic Factors:  Male  Loss Factors: Decline in physical  health  Historical Factors: NA  Risk Reduction Factors:   NA  Continued Clinical Symptoms:  Depression:   Anhedonia  Cognitive Features That Contribute To Risk:  None    Suicide Risk:  Minimal: No identifiable suicidal ideation.  Patients presenting with no risk factors but with morbid ruminations; may be classified as minimal risk based on the severity of the depressive symptoms    Plan Of Care/Follow-up recommendations: PCP   [phone], DO 07/03/2022, 10:09 AM

## 2022-07-03 NOTE — Progress Notes (Signed)
  Progress Note   Patient: Andrew Sparks [id number] DOB: [birth year] DOA: 07/02/2022     0 DOS: the patient was seen and examined on 07/03/2022   Brief hospital course: 56 y.o. male with medical history significant for paroxysmal atrial fibrillation, on Eliquis, essential hypertension, coronary artery disease, obstructive sleep apnea not on home CPAP, CVA, hyponatremia, anxiety and depression with recent intermittent suicidal thoughts for which she was admitted to [facility] unit on 7/28.  He has a history of alcohol abuse and therefore was placed on CIWA protocol and has been given Ativan as well as Risperdal.  His Prozac was stopped and he was started on Effexor XR in a.m. and 0.5 mg of p.o. Risperdal twice daily as well as Seroquel 100 mg p.o. at bedtime.  Patient became somnolent throughout the day and in the afternoon became hypotensive so was transferred to medical floor  7/31: Patient awake and alert.  Blood pressure improving   Assessment and Plan: * Major depressive disorder, recurrent severe without psychotic features (HCC) Resume his psychiatry medications including trazodone, Seroquel, Risperdal, Effexor.  Appreciate psychiatry evaluation Plan is to discharge him back to inpatient psychiatry unit as he is still suicidal  Hypotension - Blood pressure improved with IV hydration.  Holding amiodarone for now  AKI (acute kidney injury) (HCC) - Likely prerenal versus ATN due to hypotension Continue IV fluids and avoid nephrotoxic medications  Lab Results  Component Value Date   CREATININE 2.18 (H) 07/03/2022   CREATININE 2.36 (H) 07/02/2022   CREATININE 1.54 (H) 06/30/2022    Hyponatremia - Likely acute on chronic hyponatremia. - Sodium slowly improving.  121-> 126.  Continue IV hydration  Paroxysmal atrial fibrillation (HCC) - continue his Eliquis for anticoagulation.  Holding amiodarone due to hypotension  Dyslipidemia - continue statin therapy.  Vitamin B12  deficiency - continue vitamin B12.  Essential hypertension - Holding all blood pressure medicine due to hypotension  Alcohol abuse CIWA protocol        Subjective: Patient requesting to restart his psych medicine and gabapentin along with other home medicine  Physical Exam: Vitals:   07/03/22 0826 07/03/22 1202 07/03/22 1533 07/03/22 2102  BP: 125/90 98/72 109/81 100/80  Pulse: 72 77 70 65  Resp: 16 14 16 15   Temp: 97.9 F (36.6 C) (!) 97.5 F (36.4 C) 98 F (36.7 C) 97.7 F (36.5 C)  TempSrc: Oral Oral  Oral  SpO2: 100% 100% 100% 100%  Weight:      Height:       56 year old male sitting in the bed comfortably without any acute distress.  He is somewhat shaky Lungs clear to auscultation bilaterally Cardiovascular S1-S2 normal Abdomen soft, benign Neuro alert and oriented, nonfocal Skin no rash or lesions Psych depressed mood Data Reviewed:  Mag 1.6, sodium 126, creatinine 2.18  Family Communication: None  Disposition: Status is: Observation The patient remains OBS appropriate and will d/c before 2 midnights.  Planned Discharge Destination: Inpatient behavioral medicine    DVT prophylaxis-Eliquis Time spent: 35 minutes  Author: 59, MD 07/03/2022 9:53 PM  For on call review [email or web address].
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
## 2022-07-03 NOTE — Plan of Care (Signed)

## 2022-07-04 DIAGNOSIS — R69 Illness, unspecified: Secondary | ICD-10-CM | POA: Diagnosis not present

## 2022-07-04 DIAGNOSIS — I959 Hypotension, unspecified: Secondary | ICD-10-CM | POA: Diagnosis not present

## 2022-07-04 DIAGNOSIS — N179 Acute kidney failure, unspecified: Secondary | ICD-10-CM | POA: Diagnosis not present

## 2022-07-04 DIAGNOSIS — F332 Major depressive disorder, recurrent severe without psychotic features: Secondary | ICD-10-CM | POA: Diagnosis not present

## 2022-07-04 DIAGNOSIS — E871 Hypo-osmolality and hyponatremia: Secondary | ICD-10-CM | POA: Diagnosis not present

## 2022-07-04 LAB — BASIC METABOLIC PANEL
Anion gap: 7 (ref 5–15)
BUN: 25 mg/dL — ABNORMAL HIGH (ref 6–20)
CO2: 30 mmol/L (ref 22–32)
Calcium: 8.9 mg/dL (ref 8.9–10.3)
Chloride: 94 mmol/L — ABNORMAL LOW (ref 98–111)
Creatinine, Ser: 1.69 mg/dL — ABNORMAL HIGH (ref 0.61–1.24)
GFR, Estimated: 47 mL/min — ABNORMAL LOW (ref >60)
Glucose, Bld: 99 mg/dL (ref 70–99)
Potassium: 4 mmol/L (ref 3.5–5.1)
Sodium: 131 mmol/L — ABNORMAL LOW (ref 135–145)

## 2022-07-04 LAB — CBC
HCT: 31.3 % — ABNORMAL LOW (ref 39.0–52.0)
Hemoglobin: 10.4 g/dL — ABNORMAL LOW (ref 13.0–17.0)
MCH: 32.1 pg (ref 26.0–34.0)
MCHC: 33.2 g/dL (ref 30.0–36.0)
MCV: 96.6 fL (ref 80.0–100.0)
Platelets: 159 10*3/uL (ref 150–400)
RBC: 3.24 MIL/uL — ABNORMAL LOW (ref 4.22–5.81)
RDW: 12.8 % (ref 11.5–15.5)
WBC: 4.9 10*3/uL (ref 4.0–10.5)
nRBC: 0 % (ref 0.0–0.2)

## 2022-07-04 LAB — THYROID PANEL WITH TSH
Free Thyroxine Index: 2.3 (ref 1.2–4.9)
T3 Uptake Ratio: 31 % (ref 24–39)
T4, Total: 7.4 ug/dL (ref 4.5–12.0)
TSH: 0.787 u[IU]/mL (ref 0.450–4.500)

## 2022-07-04 LAB — MAGNESIUM: Magnesium: 1.9 mg/dL (ref 1.7–2.4)

## 2022-07-04 MED ORDER — BUPROPION HCL ER (XL) 150 MG PO TB24
150.0000 mg | ORAL_TABLET | Freq: Every day | ORAL | Status: DC
  Administered 2022-07-04 – 2022-07-06 (×3): 150 mg via ORAL
  Filled 2022-07-04 (×3): qty 1

## 2022-07-04 MED ORDER — GABAPENTIN 300 MG PO CAPS
300.0000 mg | ORAL_CAPSULE | Freq: Three times a day (TID) | ORAL | Status: DC
  Administered 2022-07-04 – 2022-07-06 (×6): 300 mg via ORAL
  Filled 2022-07-04 (×6): qty 1

## 2022-07-04 NOTE — Assessment & Plan Note (Signed)
-   CIWA protocol 

## 2022-07-04 NOTE — Assessment & Plan Note (Signed)
-   Likely prerenal versus ATN due to hypotension Continue IV fluids and avoid nephrotoxic medications.  Improving with hydration  Lab Results  Component Value Date   CREATININE 1.69 (H) 07/04/2022   CREATININE 2.18 (H) 07/03/2022   CREATININE 2.36 (H) 07/02/2022

## 2022-07-04 NOTE — Assessment & Plan Note (Signed)
continue vitamin B12. ?

## 2022-07-04 NOTE — Assessment & Plan Note (Signed)
Risperdal discontinued.  Decrease dose of gabapentin and added Wellbutrin per psychiatry.  To be discharged home once medically stable.  No further need of inpatient gero psych or behavioral medicine per psychiatry reevaluation today

## 2022-07-04 NOTE — Consult Note (Signed)
[facility] Face-to-Face Psychiatry Consult   Reason for Consult:  re-assessment Referring Physician:  Sherryll BurgerShah Patient Identification: Joanette Gularnesto Lealdore Shimmel MRN:  [id number] Principal Diagnosis: Major depressive disorder, recurrent severe without psychotic features (HCC) Diagnosis:  Principal Problem:   Major depressive disorder, recurrent severe without psychotic features (HCC) Active Problems:   Hyponatremia   Alcohol abuse   AKI (acute kidney injury) (HCC)   Essential hypertension   Paroxysmal atrial fibrillation (HCC)   Vitamin B12 deficiency   Hypotension   Dyslipidemia   Total Time spent with patient: 30 minutes  Subjective:   Joanette Gularnesto Lealdore Hagerty is a 56 y.o. male patient admitted with hyponatremia on medical floor, also MDD.  HPI:  see previous consult  from 07/03/22  On evaluation this afternoon, patient is more alert. He denies suicidal thoughts, homicidal thoughts, paranoia, auditory or visual hallucinations. Patient feels more positive about future today, and states he is committed to getting mental health and substance use treatment.  Patient is comfortable with discharge home with outpatient services, and in writer's assessment, this is safe plan. Patient reports he lives with his uncle.   Past Psychiatric History: depression, alcohol use, anxiety  Risk to Self:   Risk to Others:   Prior Inpatient Therapy:   Prior Outpatient Therapy:    Past Medical History:  Past Medical History:  Diagnosis Date   A-fib (HCC)    Anxiety    CHF (congestive heart failure) (HCC)    Depression    Hypersomnia with sleep apnea    Hypertension    Morbid obesity (HCC)    NSTEMI (non-ST elevated myocardial infarction) (HCC) 2005   Obesity hypoventilation syndrome (HCC)    Shortness of breath dyspnea    Sleep apnea    Stroke (HCC)    [facility](HCC)     Past Surgical History:  Procedure Laterality Date   LEFT HEART CATH AND CORONARY ANGIOGRAPHY Right 09/10/2018   Procedure: LEFT HEART  CATH AND CORONARY ANGIOGRAPHY with possible percutaneous intervention;  Surgeon: Laurier NancyKhan, Shaukat A, MD;  Location: [facility] INVASIVE CV LAB;  Service: Cardiovascular;  Laterality: Right;   TRACHEOSTOMY     Family History:  Family History  Problem Relation Age of Onset   Heart disease Father    Diabetes type II Mother    Breast cancer Mother    Family Psychiatric  History:  Social History:  Social History   Substance and Sexual Activity  Alcohol Use Yes   Alcohol/week: 4.0 standard drinks of alcohol   Types: 4 Cans of beer per week   Comment: 1/5 of whiskey dailty for last 5 days     Social History   Substance and Sexual Activity  Drug Use Yes   Types: Marijuana    Social History   Socioeconomic History   Marital status: Single    Spouse name: Not on file   Number of children: Not on file   Years of education: Not on file   Highest education level: Not on file  Occupational History   Occupation: disabled  Tobacco Use   Smoking status: Every Day    Packs/day: 0.25    Years: 28.00    Total pack years: 7.00    Types: Cigarettes   Smokeless tobacco: Never  Vaping Use   Vaping Use: Never used  Substance and Sexual Activity   Alcohol use: Yes    Alcohol/week: 4.0 standard drinks of alcohol    Types: 4 Cans of beer per week    Comment: 1/5 of whiskey  dailty for last 5 days   Drug use: Yes    Types: Marijuana   Sexual activity: Yes  Other Topics Concern   Not on file  Social History Narrative   Not on file   Social Determinants of Health   Financial Resource Strain: Not on file  Food Insecurity: Not on file  Transportation Needs: Not on file  Physical Activity: Not on file  Stress: Not on file  Social Connections: Not on file   Additional Social History:    Allergies:  No Known Allergies  Labs:  Results for orders placed or performed during the hospital encounter of 07/02/22 (from the past 48 hour(s))  Osmolality     Status: Abnormal   Collection Time:  07/02/22  8:35 PM  Result Value Ref Range   Osmolality 269 (L) 275 - 295 mOsm/kg    Comment: Performed at [facility], [address] [place], [place] [address]  Basic metabolic panel     Status: Abnormal   Collection Time: 07/03/22  6:21 AM  Result Value Ref Range   Sodium 126 (L) 135 - 145 mmol/L   Potassium 4.0 3.5 - 5.1 mmol/L   Chloride 87 (L) 98 - 111 mmol/L   CO2 29 22 - 32 mmol/L   Glucose, Bld 103 (H) 70 - 99 mg/dL    Comment: Glucose reference range applies only to samples taken after fasting for at least 8 hours.   BUN 32 (H) 6 - 20 mg/dL   Creatinine, Ser 0.27 (H) 0.61 - 1.24 mg/dL   Calcium 9.1 8.9 - 25.3 mg/dL   GFR, Estimated 35 (L) >60 mL/min    Comment: (NOTE) Calculated using the CKD-EPI Creatinine Equation (2021)    Anion gap 10 5 - 15    Comment: Performed at [facility], [address] [place], [place] [address]  CBC     Status: Abnormal   Collection Time: 07/03/22  6:21 AM  Result Value Ref Range   WBC 4.7 4.0 - 10.5 K/uL   RBC 3.51 (L) 4.22 - 5.81 MIL/uL   Hemoglobin 11.2 (L) 13.0 - 17.0 g/dL   HCT 34.7 (L) 42.5 - 95.6 %   MCV 94.9 80.0 - 100.0 fL   MCH 31.9 26.0 - 34.0 pg   MCHC 33.6 30.0 - 36.0 g/dL   RDW 38.7 56.4 - 33.2 %   Platelets 154 150 - 400 K/uL   nRBC 0.0 0.0 - 0.2 %    Comment: Performed at [facility], [address] [place], [place] [address]  HIV Antibody (routine testing w rflx)     Status: None   Collection Time: 07/03/22  6:21 AM  Result Value Ref Range   HIV Screen 4th Generation wRfx Non Reactive Non Reactive    Comment: Performed at [facility], [address], [place], [place] [address]  Magnesium     Status: Abnormal   Collection Time: 07/03/22  6:21 AM  Result Value Ref Range   Magnesium 1.6 (L) 1.7 - 2.4 mg/dL    Comment: Performed at [facility], [address] [place], [place] [address]  Na and K (sodium & potassium), rand urine     Status: None   Collection Time:  07/03/22  6:45 AM  Result Value Ref Range   Sodium, Ur <10 mmol/L   Potassium Urine 18 mmol/L    Comment: Performed at [facility], [address], [place], [place] [address]  Osmolality, urine  Status: Abnormal   Collection Time: 07/03/22  6:45 AM  Result Value Ref Range   Osmolality, Ur 226 (L) 300 - 900 mOsm/kg    Comment: REPEATED TO VERIFY MW Performed at [facility], [address] [place], [place] [address]   CBC     Status: Abnormal   Collection Time: 07/04/22  5:21 AM  Result Value Ref Range   WBC 4.9 4.0 - 10.5 K/uL   RBC 3.24 (L) 4.22 - 5.81 MIL/uL   Hemoglobin 10.4 (L) 13.0 - 17.0 g/dL   HCT 83.3 (L) 82.5 - 05.3 %   MCV 96.6 80.0 - 100.0 fL   MCH 32.1 26.0 - 34.0 pg   MCHC 33.2 30.0 - 36.0 g/dL   RDW 97.6 73.4 - 19.3 %   Platelets 159 150 - 400 K/uL   nRBC 0.0 0.0 - 0.2 %    Comment: Performed at [facility], [address], [place], [place] [address]  Basic metabolic panel     Status: Abnormal   Collection Time: 07/04/22  5:21 AM  Result Value Ref Range   Sodium 131 (L) 135 - 145 mmol/L   Potassium 4.0 3.5 - 5.1 mmol/L   Chloride 94 (L) 98 - 111 mmol/L   CO2 30 22 - 32 mmol/L   Glucose, Bld 99 70 - 99 mg/dL    Comment: Glucose reference range applies only to samples taken after fasting for at least 8 hours.   BUN 25 (H) 6 - 20 mg/dL   Creatinine, Ser 0.97 (H) 0.61 - 1.24 mg/dL   Calcium 8.9 8.9 - 35.3 mg/dL   GFR, Estimated 47 (L) >60 mL/min    Comment: (NOTE) Calculated using the CKD-EPI Creatinine Equation (2021)    Anion gap 7 5 - 15    Comment: Performed at [facility], [address], [place], [place] [address]  Magnesium     Status: None   Collection Time: 07/04/22  5:21 AM  Result Value Ref Range   Magnesium 1.9 1.7 - 2.4 mg/dL    Comment: Performed at [facility], [address] [place], [place] [address]    Current Facility-Administered Medications  Medication Dose Route Frequency  Provider Last Rate Last Admin   0.9 %  sodium chloride infusion   Intravenous Continuous Mansy, Jan A, MD 100 mL/hr at 07/04/22 1538 Infusion Verify at 07/04/22 1538   acetaminophen (TYLENOL) tablet 650 mg  650 mg Oral Q6H PRN Mansy, Jan A, MD       Or   acetaminophen (TYLENOL) suppository 650 mg  650 mg Rectal Q6H PRN Mansy, Jan A, MD       apixaban (ELIQUIS) tablet 5 mg  5 mg Oral BID Mansy, Jan A, MD   5 mg at 07/04/22 1003   buPROPion (WELLBUTRIN XL) 24 hr tablet 150 mg  150 mg Oral Daily Gabriel Cirri F, NP   150 mg at 07/04/22 1317   cyanocobalamin (VITAMIN B12) tablet 1,000 mcg  1,000 mcg Oral Daily Mansy, Jan A, MD   1,000 mcg at 07/04/22 1003   folic acid (FOLVITE) tablet 1 mg  1 mg Oral Daily Delfino Lovett, MD   1 mg at 07/04/22 1004   gabapentin (NEURONTIN) capsule 300 mg  300 mg Oral TID Gabriel Cirri F, NP   300 mg at 07/04/22 1541   LORazepam (ATIVAN) tablet 1-4 mg  1-4 mg Oral Q1H PRN Delfino Lovett, MD   1 mg at 07/03/22 1831  Or   LORazepam (ATIVAN) injection 1-4 mg  1-4 mg Intravenous Q1H PRN Delfino Lovett, MD       magnesium hydroxide (MILK OF MAGNESIA) suspension 30 mL  30 mL Oral Daily PRN Mansy, Jan A, MD       multivitamin with minerals tablet 1 tablet  1 tablet Oral Daily Delfino Lovett, MD   1 tablet at 07/04/22 1003   nicotine (NICODERM CQ - dosed in mg/24 hours) patch 14 mg  14 mg Transdermal Daily Delfino Lovett, MD   14 mg at 07/04/22 1004   ondansetron (ZOFRAN) tablet 4 mg  4 mg Oral Q6H PRN Mansy, Jan A, MD       Or   ondansetron [facility]) injection 4 mg  4 mg Intravenous Q6H PRN Mansy, Jan A, MD       QUEtiapine (SEROQUEL) tablet 100 mg  100 mg Oral QHS Delfino Lovett, MD   100 mg at 07/03/22 2258   senna-docusate (Senokot-S) tablet 2 tablet  2 tablet Oral BID Delfino Lovett, MD   2 tablet at 07/04/22 1003   thiamine (VITAMIN B1) tablet 100 mg  100 mg Oral Daily Delfino Lovett, MD   100 mg at 07/04/22 1004   Or   thiamine (VITAMIN B1) injection 100 mg  100 mg Intravenous Daily  Delfino Lovett, MD       traZODone (DESYREL) tablet 25 mg  25 mg Oral QHS PRN Mansy, Jan A, MD   25 mg at 07/02/22 2318   venlafaxine XR (EFFEXOR-XR) 24 hr capsule 75 mg  75 mg Oral Q breakfast Delfino Lovett, MD   75 mg at 07/04/22 1003    Musculoskeletal: Strength & Muscle Tone:  did not assess Gait & Station:  did not observe Patient leans: N/A   Psychiatric Specialty Exam:  Presentation  General Appearance: Appropriate for Environment  Eye Contact:Good  Speech:Clear and Coherent  Speech Volume:Normal  Handedness:No data recorded  Mood and Affect  Mood:Euthymic  Affect:Congruent   Thought Process  Thought Processes:Coherent  Descriptions of Associations:Intact  Orientation:Full (Time, Place and Person)  Thought Content:WDL; Logical  History of Schizophrenia/Schizoaffective disorder:No  Duration of Psychotic Symptoms:No data recorded Hallucinations:Hallucinations: None  Ideas of Reference:None  Suicidal Thoughts:  No Homicidal Thoughts:Homicidal Thoughts: No   Sensorium  Memory:Immediate Good  Judgment:Good  Insight:Fair   Executive Functions  Concentration:Fair  Attention Span:Fair  Recall:Fair  Fund of Knowledge:Fair  Language:Fair   Psychomotor Activity  Psychomotor Activity:Psychomotor Activity: Normal   Assets  Assets:Desire for Improvement; Financial Resources/Insurance; Housing; Social Support; Resilience   Sleep  Sleep:Sleep: Good   Physical Exam: Physical Exam Vitals and nursing note reviewed.  HENT:     Head: Normocephalic.     Nose: No congestion or rhinorrhea.  Eyes:     General:        Right eye: No discharge.        Left eye: No discharge.  Cardiovascular:     Rate and Rhythm: Normal rate.  Pulmonary:     Effort: Pulmonary effort is normal.  Musculoskeletal:     Cervical back: Normal range of motion.  Skin:    General: Skin is dry.  Neurological:     Mental Status: He is alert and oriented to person, place,  and time.  Psychiatric:        Attention and Perception: Attention normal.        Mood and Affect: Mood normal.        Speech: Speech normal.  Behavior: Behavior is cooperative.        Thought Content: Thought content is not paranoid. Thought content does not include homicidal or suicidal ideation.        Cognition and Memory: Cognition normal.        Judgment: Judgment normal.     Comments: Still somewhat sleepy, but alert enough to answer questions appropraitely    Review of Systems  Reason unable to perform ROS: iinterview conducted on medical unit.  Respiratory: Negative.    Skin: Negative.   Psychiatric/Behavioral:  Positive for depression (stable). Negative for hallucinations, memory loss, substance abuse and suicidal ideas. The patient is not nervous/anxious and does not have insomnia.    Blood pressure 117/87, pulse 82, temperature (!) 97.3 F (36.3 C), temperature source Oral, resp. rate 16, height 5\' 9"  (1.753 m), weight 98 kg, SpO2 95 %. Body mass index is 31.91 kg/m.  Treatment Plan Summary: Plan At this time, patient denies any suicidal or homicidal  thoughts, paranoia, auditory or visual hallucinations. Patient voices understanding that he will not be going to inpatient psychiatry when he is discharged from the medical unit. He is agreeable to follow up with his outpatient providers for mental health and alcohol use treatment. Reviewed with Dr. and Dr. Sherryll Burger via secure chat. Writer discontinued patient's Risperdal and decreased Gabapentin to 300 mg 3 times daily, as patient is still quite sleepy throughout the day. Added Wellbutrin XL 150 mg daily.    Disposition: No evidence of imminent risk to self or others at present.   Supportive therapy provided about ongoing stressors. Discussed crisis plan, support from social network, calling 911, coming to the Emergency Department, and calling Suicide Hotline.  Marlou Porch, NP 07/04/2022 3:43 PM

## 2022-07-04 NOTE — Assessment & Plan Note (Signed)
-   Holding all blood pressure medicine due to hypotension.

## 2022-07-04 NOTE — TOC Progression Note (Signed)
Transition of Care [facility]) - Progression Note    Patient Details  Name: Andrew Sparks MRN: [id number] Date of Birth: June 30, 1966  [facility] CM/SW Contact  Margarito Liner, LCSW Phone Number: 07/04/2022, 3:48 PM  Clinical Narrative: Printed outpatient counseling resources to the unit and asked RN to add to the other resources that were put in patient's chart yesterday.    Expected Discharge Plan: Psychiatric Hospital Barriers to Discharge: Continued Medical Work up  Expected Discharge Plan and Services Expected Discharge Plan: Psychiatric Hospital     Post Acute Care Choice: NA Living arrangements for the past 2 months: Single Family Home                                       Social Determinants of Health (SDOH) Interventions    Readmission Risk Interventions     No data to display

## 2022-07-04 NOTE — Assessment & Plan Note (Signed)
-   continue statin therapy. 

## 2022-07-04 NOTE — Assessment & Plan Note (Signed)
-   Likely acute on chronic hyponatremia. - Sodium slowly improving.  121-> 131.  Continue IV hydration

## 2022-07-04 NOTE — Assessment & Plan Note (Signed)
-   continue his Eliquis for anticoagulation.  Holding amiodarone due to hypotension.

## 2022-07-04 NOTE — Progress Notes (Signed)
  Progress Note   Patient: Andrew Sparks JEH:[id number] DOB: 06-12-1966 DOA: 07/02/2022     0 DOS: the patient was seen and examined on 07/04/2022   Brief hospital course: 56 y.o. male with medical history significant for paroxysmal atrial fibrillation, on Eliquis, essential hypertension, coronary artery disease, obstructive sleep apnea not on home CPAP, CVA, hyponatremia, anxiety and depression with recent intermittent suicidal thoughts for which she was admitted to [facility] unit on 7/28.  He has a history of alcohol abuse and therefore was placed on CIWA protocol and has been given Ativan as well as Risperdal.  His Prozac was stopped and he was started on Effexor XR in a.m. and 0.5 mg of p.o. Risperdal twice daily as well as Seroquel 100 mg p.o. at bedtime.  Patient became somnolent throughout the day and in the afternoon became hypotensive so was transferred to medical floor  7/31: Patient awake and alert.  Blood pressure improving 8/1: Risperdal discontinued.  Decrease dose of gabapentin and added Wellbutrin per psychiatry.  To be discharged home on the medical floor once stable per psychiatry   Assessment and Plan: * Major depressive disorder, recurrent severe without psychotic features (HCC) Risperdal discontinued.  Decrease dose of gabapentin and added Wellbutrin per psychiatry.  To be discharged home once medically stable.  No further need of inpatient gero psych or behavioral medicine per psychiatry reevaluation today  Hypotension - Blood pressure improved with IV hydration.  Holding amiodarone for now.  AKI (acute kidney injury) (HCC) - Likely prerenal versus ATN due to hypotension Continue IV fluids and avoid nephrotoxic medications.  Improving with hydration  Lab Results  Component Value Date   CREATININE 1.69 (H) 07/04/2022   CREATININE 2.18 (H) 07/03/2022   CREATININE 2.36 (H) 07/02/2022    Hyponatremia - Likely acute on chronic hyponatremia. - Sodium slowly  improving.  121-> 131.  Continue IV hydration  Paroxysmal atrial fibrillation (HCC) - continue his Eliquis for anticoagulation.  Holding amiodarone due to hypotension.  Dyslipidemia - continue statin therapy  Vitamin B12 deficiency - continue vitamin B12  Essential hypertension - Holding all blood pressure medicine due to hypotension.  Alcohol abuse CIWA protocol.        Subjective: No new issues.  Feeling overall better.  He slept well last night  Physical Exam: Vitals:   07/03/22 2310 07/04/22 0819 07/04/22 1141 07/04/22 1543  BP: 121/88 117/87  (!) 131/99  Pulse: 90 82  90  Resp:  16    Temp: 97.7 F (36.5 C) (!) 97.3 F (36.3 C)  98.9 F (37.2 C)  TempSrc: Oral Oral  Oral  SpO2: 94% 100% 95% 100%  Weight:      Height:       56 year old male sitting in the bed comfortably without any acute distress.  He is somewhat shaky Lungs clear to auscultation bilaterally Cardiovascular S1-S2 normal Abdomen soft, benign Neuro alert and oriented, nonfocal Skin no rash or lesions Psych normal mood and affect Data Reviewed:  Sodium 131  Family Communication: None  Disposition: Status is: Observation The patient remains OBS appropriate and will d/c before 2 midnights.  Planned Discharge Destination: Home    DVT prophylaxis-Eliquis Time spent: 35 minutes  Author: Delfino Lovett, MD 07/04/2022 5:11 PM  For on call review [email or web address].

## 2022-07-04 NOTE — Assessment & Plan Note (Signed)
-   Blood pressure improved with IV hydration.  Holding amiodarone for now.

## 2022-07-05 DIAGNOSIS — F17210 Nicotine dependence, cigarettes, uncomplicated: Secondary | ICD-10-CM | POA: Diagnosis not present

## 2022-07-05 DIAGNOSIS — F332 Major depressive disorder, recurrent severe without psychotic features: Secondary | ICD-10-CM | POA: Diagnosis not present

## 2022-07-05 DIAGNOSIS — I2510 Atherosclerotic heart disease of native coronary artery without angina pectoris: Secondary | ICD-10-CM | POA: Diagnosis not present

## 2022-07-05 DIAGNOSIS — I959 Hypotension, unspecified: Secondary | ICD-10-CM | POA: Diagnosis not present

## 2022-07-05 DIAGNOSIS — R69 Illness, unspecified: Secondary | ICD-10-CM | POA: Diagnosis not present

## 2022-07-05 DIAGNOSIS — I480 Paroxysmal atrial fibrillation: Secondary | ICD-10-CM | POA: Diagnosis not present

## 2022-07-05 DIAGNOSIS — I10 Essential (primary) hypertension: Secondary | ICD-10-CM | POA: Diagnosis not present

## 2022-07-05 DIAGNOSIS — E871 Hypo-osmolality and hyponatremia: Secondary | ICD-10-CM | POA: Diagnosis not present

## 2022-07-05 DIAGNOSIS — E538 Deficiency of other specified B group vitamins: Secondary | ICD-10-CM | POA: Diagnosis not present

## 2022-07-05 DIAGNOSIS — N179 Acute kidney failure, unspecified: Secondary | ICD-10-CM | POA: Diagnosis not present

## 2022-07-05 DIAGNOSIS — N170 Acute kidney failure with tubular necrosis: Secondary | ICD-10-CM | POA: Diagnosis not present

## 2022-07-05 DIAGNOSIS — G4710 Hypersomnia, unspecified: Secondary | ICD-10-CM | POA: Diagnosis not present

## 2022-07-05 DIAGNOSIS — Z79899 Other long term (current) drug therapy: Secondary | ICD-10-CM | POA: Diagnosis not present

## 2022-07-05 DIAGNOSIS — Z6831 Body mass index (BMI) 31.0-31.9, adult: Secondary | ICD-10-CM | POA: Diagnosis not present

## 2022-07-05 DIAGNOSIS — E662 Morbid (severe) obesity with alveolar hypoventilation: Secondary | ICD-10-CM | POA: Diagnosis not present

## 2022-07-05 DIAGNOSIS — E861 Hypovolemia: Secondary | ICD-10-CM | POA: Diagnosis not present

## 2022-07-05 DIAGNOSIS — Z8249 Family history of ischemic heart disease and other diseases of the circulatory system: Secondary | ICD-10-CM | POA: Diagnosis not present

## 2022-07-05 DIAGNOSIS — E785 Hyperlipidemia, unspecified: Secondary | ICD-10-CM | POA: Diagnosis not present

## 2022-07-05 DIAGNOSIS — I252 Old myocardial infarction: Secondary | ICD-10-CM | POA: Diagnosis not present

## 2022-07-05 DIAGNOSIS — F419 Anxiety disorder, unspecified: Secondary | ICD-10-CM | POA: Diagnosis not present

## 2022-07-05 DIAGNOSIS — Z7901 Long term (current) use of anticoagulants: Secondary | ICD-10-CM | POA: Diagnosis not present

## 2022-07-05 DIAGNOSIS — Z8673 Personal history of transient ischemic attack (TIA), and cerebral infarction without residual deficits: Secondary | ICD-10-CM | POA: Diagnosis not present

## 2022-07-05 LAB — CBC
HCT: 32.2 % — ABNORMAL LOW (ref 39.0–52.0)
Hemoglobin: 10.7 g/dL — ABNORMAL LOW (ref 13.0–17.0)
MCH: 32.1 pg (ref 26.0–34.0)
MCHC: 33.2 g/dL (ref 30.0–36.0)
MCV: 96.7 fL (ref 80.0–100.0)
Platelets: 185 10*3/uL (ref 150–400)
RBC: 3.33 MIL/uL — ABNORMAL LOW (ref 4.22–5.81)
RDW: 12.9 % (ref 11.5–15.5)
WBC: 5.1 10*3/uL (ref 4.0–10.5)
nRBC: 0 % (ref 0.0–0.2)

## 2022-07-05 LAB — BASIC METABOLIC PANEL
Anion gap: 8 (ref 5–15)
BUN: 22 mg/dL — ABNORMAL HIGH (ref 6–20)
CO2: 29 mmol/L (ref 22–32)
Calcium: 9.2 mg/dL (ref 8.9–10.3)
Chloride: 96 mmol/L — ABNORMAL LOW (ref 98–111)
Creatinine, Ser: 1.25 mg/dL — ABNORMAL HIGH (ref 0.61–1.24)
GFR, Estimated: >60 mL/min (ref >60)
Glucose, Bld: 99 mg/dL (ref 70–99)
Potassium: 3.8 mmol/L (ref 3.5–5.1)
Sodium: 133 mmol/L — ABNORMAL LOW (ref 135–145)

## 2022-07-05 MED ORDER — HYDRALAZINE HCL 50 MG PO TABS
100.0000 mg | ORAL_TABLET | Freq: Three times a day (TID) | ORAL | Status: DC
  Administered 2022-07-05 – 2022-07-06 (×4): 100 mg via ORAL
  Filled 2022-07-05 (×4): qty 2

## 2022-07-05 MED ORDER — AMLODIPINE BESYLATE 5 MG PO TABS
5.0000 mg | ORAL_TABLET | Freq: Every day | ORAL | Status: DC
  Administered 2022-07-05 – 2022-07-06 (×2): 5 mg via ORAL
  Filled 2022-07-05 (×2): qty 1

## 2022-07-05 MED ORDER — METOPROLOL TARTRATE 50 MG PO TABS
50.0000 mg | ORAL_TABLET | Freq: Two times a day (BID) | ORAL | Status: DC
  Administered 2022-07-05 – 2022-07-06 (×3): 50 mg via ORAL
  Filled 2022-07-05 (×3): qty 1

## 2022-07-05 NOTE — Assessment & Plan Note (Signed)
-   Likely acute on chronic hyponatremia. - Sodium improving with hydration.  121-> 133.

## 2022-07-05 NOTE — Plan of Care (Signed)

## 2022-07-05 NOTE — Assessment & Plan Note (Addendum)
-   continue his Eliquis for anticoagulation.  Can resume amiodarone at discharge

## 2022-07-05 NOTE — Assessment & Plan Note (Signed)
Risperdal discontinued.  Decrease dose of gabapentin and added Wellbutrin per psychiatry.

## 2022-07-05 NOTE — Progress Notes (Addendum)
Patient remains calm and cooperative. Had a bath last night. Was transferred here from [facility] for hypotension but currently has a high DBP. CIWAs q6h are 0.Denied suicidal ideations, pain and additional needs.

## 2022-07-05 NOTE — Progress Notes (Signed)
  Progress Note   Patient: Andrew Sparks NWG:[id number] DOB: December 24, 1965 DOA: 07/02/2022     0 DOS: the patient was seen and examined on 07/05/2022   Brief hospital course: 56 y.o. male with medical history significant for paroxysmal atrial fibrillation, on Eliquis, essential hypertension, coronary artery disease, obstructive sleep apnea not on home CPAP, CVA, hyponatremia, anxiety and depression with recent intermittent suicidal thoughts for which she was admitted to [facility] unit on 7/28.  He has a history of alcohol abuse and therefore was placed on CIWA protocol and has been given Ativan as well as Risperdal.  His Prozac was stopped and he was started on Effexor XR in a.m. and 0.5 mg of p.o. Risperdal twice daily as well as Seroquel 100 mg p.o. at bedtime.  Patient became somnolent throughout the day and in the afternoon became hypotensive so was transferred to medical floor  7/31: Patient awake and alert.  Blood pressure improving 8/1: Risperdal discontinued.  Decrease dose of gabapentin and added Wellbutrin per psychiatry.  To be discharged home on the medical floor once stable per psychiatry 8/2: Ongoing IV hydration for 1 more day for possible discharge tomorrow   Assessment and Plan: * Major depressive disorder, recurrent severe without psychotic features (HCC) Risperdal discontinued.  Decrease dose of gabapentin and added Wellbutrin per psychiatry.    Hypotension - Blood pressure improved with IV hydration.  Holding amiodarone for now  AKI (acute kidney injury) (HCC) - Likely prerenal versus ATN due to hypotension  Improving with hydration  Lab Results  Component Value Date   CREATININE 1.25 (H) 07/05/2022   CREATININE 1.69 (H) 07/04/2022   CREATININE 2.18 (H) 07/03/2022    Hyponatremia - Likely acute on chronic hyponatremia. - Sodium improving with hydration.  121-> 133.   Paroxysmal atrial fibrillation (HCC) - continue his Eliquis for anticoagulation.  Can resume  amiodarone at discharge  Dyslipidemia - continue statin therapy.  Vitamin B12 deficiency - continue vitamin B12.  Essential hypertension - Added hydralazine and metoprolol today for better blood pressure control  Alcohol abuse Discontinue CIWA        Subjective: Sitting in the bed and feeling better.  Blood pressure going up  Physical Exam: Vitals:   07/05/22 0803 07/05/22 1013 07/05/22 1121 07/05/22 1450  BP: (!) 152/127 114/87 (!) 120/93 (!) 139/120  Pulse: 95 71 69 85  Resp: 18 18    Temp: 98.5 F (36.9 C) 98.4 F (36.9 C)  99.5 F (37.5 C)  TempSrc: Oral   Oral  SpO2: 96% 96% 100% 98%  Weight:      Height:       56 year old male sitting in the bed comfortably without any acute distress. He is somewhat shaky Lungs clear to auscultation bilaterally Cardiovascular S1-S2 normal Abdomen soft, benign Neuro alert and oriented, nonfocal Skin no rash or lesions Psych normal mood and affect Data Reviewed:  Sodium 133  Family Communication: None  Disposition: Status is: Inpatient Remains inpatient appropriate because: Getting blood pressure under better control   Planned Discharge Destination: Home    DVT prophylaxis-Eliquis Time spent: 35 minutes  Author: Delfino Lovett, MD 07/05/2022 2:54 PM  For on call review [email or web address].

## 2022-07-05 NOTE — Assessment & Plan Note (Signed)
-   Blood pressure improved with IV hydration.  Holding amiodarone for now. 

## 2022-07-05 NOTE — Assessment & Plan Note (Signed)
-   Likely prerenal versus ATN due to hypotension  Improving with hydration  Lab Results  Component Value Date   CREATININE 1.25 (H) 07/05/2022   CREATININE 1.69 (H) 07/04/2022   CREATININE 2.18 (H) 07/03/2022

## 2022-07-05 NOTE — Assessment & Plan Note (Signed)
-   continue statin therapy. 

## 2022-07-05 NOTE — Assessment & Plan Note (Signed)
continue vitamin B12. ?

## 2022-07-05 NOTE — Assessment & Plan Note (Signed)
Discontinue CIWA

## 2022-07-05 NOTE — Assessment & Plan Note (Addendum)
-   Added hydralazine and metoprolol today for better blood pressure control

## 2022-07-06 DIAGNOSIS — I959 Hypotension, unspecified: Secondary | ICD-10-CM | POA: Diagnosis not present

## 2022-07-06 DIAGNOSIS — N179 Acute kidney failure, unspecified: Secondary | ICD-10-CM | POA: Diagnosis not present

## 2022-07-06 DIAGNOSIS — R69 Illness, unspecified: Secondary | ICD-10-CM | POA: Diagnosis not present

## 2022-07-06 DIAGNOSIS — E871 Hypo-osmolality and hyponatremia: Secondary | ICD-10-CM | POA: Diagnosis not present

## 2022-07-06 DIAGNOSIS — F332 Major depressive disorder, recurrent severe without psychotic features: Secondary | ICD-10-CM | POA: Diagnosis not present

## 2022-07-06 LAB — CBC
HCT: 31.5 % — ABNORMAL LOW (ref 39.0–52.0)
Hemoglobin: 10.7 g/dL — ABNORMAL LOW (ref 13.0–17.0)
MCH: 32.4 pg (ref 26.0–34.0)
MCHC: 34 g/dL (ref 30.0–36.0)
MCV: 95.5 fL (ref 80.0–100.0)
Platelets: 184 10*3/uL (ref 150–400)
RBC: 3.3 MIL/uL — ABNORMAL LOW (ref 4.22–5.81)
RDW: 13.2 % (ref 11.5–15.5)
WBC: 5.9 10*3/uL (ref 4.0–10.5)
nRBC: 0 % (ref 0.0–0.2)

## 2022-07-06 LAB — BASIC METABOLIC PANEL
Anion gap: 9 (ref 5–15)
BUN: 20 mg/dL (ref 6–20)
CO2: 29 mmol/L (ref 22–32)
Calcium: 9.3 mg/dL (ref 8.9–10.3)
Chloride: 93 mmol/L — ABNORMAL LOW (ref 98–111)
Creatinine, Ser: 1.19 mg/dL (ref 0.61–1.24)
GFR, Estimated: >60 mL/min (ref >60)
Glucose, Bld: 93 mg/dL (ref 70–99)
Potassium: 4 mmol/L (ref 3.5–5.1)
Sodium: 131 mmol/L — ABNORMAL LOW (ref 135–145)

## 2022-07-06 MED ORDER — GABAPENTIN 300 MG PO CAPS: 300.0000 mg | ORAL_CAPSULE | Freq: Three times a day (TID) | ORAL | 0 refills | Status: DC

## 2022-07-06 MED ORDER — HYDRALAZINE HCL 100 MG PO TABS: 100.0000 mg | ORAL_TABLET | Freq: Three times a day (TID) | ORAL | 0 refills | Status: DC

## 2022-07-06 MED ORDER — ROSUVASTATIN CALCIUM 20 MG PO TABS: 20.0000 mg | ORAL_TABLET | Freq: Every day | ORAL | 0 refills | Status: DC

## 2022-07-06 MED ORDER — APIXABAN 5 MG PO TABS: 5.0000 mg | ORAL_TABLET | Freq: Two times a day (BID) | ORAL | 0 refills | Status: DC

## 2022-07-06 MED ORDER — BUPROPION HCL ER (XL) 150 MG PO TB24: 150.0000 mg | ORAL_TABLET | Freq: Every day | ORAL | 0 refills | Status: DC

## 2022-07-06 MED ORDER — AMLODIPINE BESYLATE 5 MG PO TABS: 5.0000 mg | ORAL_TABLET | Freq: Every day | ORAL | 0 refills | Status: DC

## 2022-07-06 MED ORDER — QUETIAPINE FUMARATE 100 MG PO TABS: 100.0000 mg | ORAL_TABLET | Freq: Every day | ORAL | 0 refills | Status: DC

## 2022-07-06 MED ORDER — CHLORTHALIDONE 25 MG PO TABS: 25.0000 mg | ORAL_TABLET | Freq: Every day | ORAL | 0 refills | Status: DC

## 2022-07-06 MED ORDER — VENLAFAXINE HCL ER 75 MG PO CP24: 75.0000 mg | ORAL_CAPSULE | Freq: Every day | ORAL | 0 refills | Status: DC

## 2022-07-06 MED ORDER — METOPROLOL TARTRATE 50 MG PO TABS
50.0000 mg | ORAL_TABLET | Freq: Two times a day (BID) | ORAL | 0 refills | Status: DC
Start: 2022-07-06 — End: 2023-01-24

## 2022-07-06 MED ORDER — AMIODARONE HCL 200 MG PO TABS: 200.0000 mg | ORAL_TABLET | Freq: Every day | ORAL | 0 refills | Status: DC

## 2022-07-06 MED ORDER — VALSARTAN 320 MG PO TABS: 320.0000 mg | ORAL_TABLET | Freq: Every morning | ORAL | 0 refills | Status: DC

## 2022-07-06 MED ORDER — FUROSEMIDE 40 MG PO TABS: 40.0000 mg | ORAL_TABLET | Freq: Every day | ORAL | 0 refills | Status: DC

## 2022-07-06 NOTE — Progress Notes (Signed)
Called patient's girlfriend Maxine Glenn about patient's discharge today, no answer.  Message left.

## 2022-07-06 NOTE — Progress Notes (Signed)
Patient's girlfriend on her way to pick up patient.  Discharge instructions given to patient.  Patient verbalized understanding.  Encouraged to call the doctor for concerns.  Needs addressed.  Discharged home.

## 2022-07-06 NOTE — Progress Notes (Signed)
Patient is calm and cooperative. Appeared lethargic this morning which is most likely due to night meds. Alert once name is called. DBP remains elevated. He denied pain and remained sleep most of the night. Will receive scheduled hydralazine this morning. Denies additional needs.

## 2022-07-07 NOTE — Discharge Summary (Signed)
Physician Discharge Summary   Patient: Andrew Sparks MRN: [id number] DOB: 1966/11/08  Admit date:     07/02/2022  Discharge date: 07/06/2022  Discharge Physician: Delfino Lovett   PCP: Sherron Monday, MD   Recommendations at discharge:   Follow-up with outpatient providers as requested  Discharge Diagnoses: Principal Problem:   Major depressive disorder, recurrent severe without psychotic features (HCC) Active Problems:   Hypotension   AKI (acute kidney injury) (HCC)   Hyponatremia   Paroxysmal atrial fibrillation (HCC)   Alcohol abuse   Essential hypertension   Vitamin B12 deficiency   Dyslipidemia  Hospital Course: 56 y.o. male with medical history significant for paroxysmal atrial fibrillation, on Eliquis, essential hypertension, coronary artery disease, obstructive sleep apnea not on home CPAP, CVA, hyponatremia, anxiety and depression with recent intermittent suicidal thoughts for which she was admitted to [facility] psych unit on 7/28.  He has a history of alcohol abuse and therefore was placed on CIWA protocol and has been given Ativan as well as Risperdal.  His Prozac was stopped and he was started on Effexor XR in a.m. and 0.5 mg of p.o. Risperdal twice daily as well as Seroquel 100 mg p.o. at bedtime.  Patient became somnolent throughout the day and in the afternoon became hypotensive so was transferred to medical floor  7/31: Patient awake and alert.  Blood pressure improving 8/1: Risperdal discontinued.  Decrease dose of gabapentin and added Wellbutrin per psychiatry.  To be discharged home on the medical floor once stable per psychiatry 8/2: Ongoing IV hydration for 1 more day for possible discharge tomorrow  Assessment and Plan: * Major depressive disorder, recurrent severe without psychotic features (HCC) Adjusted meds per psychiatry.  He will need outpatient psychiatry follow-up  Hypotension with h/o HTN - Now resolved with hydration.  Resume home medicine at  discharge  AKI (acute kidney injury) (HCC) - Likely ATN due to hypotension.  Creatinine 1.1 on the day of discharge  Improving with hydration and improvement in blood pressure  Hyponatremia - Likely acute on chronic hyponatremia. - Sodium improved with hydration  Paroxysmal atrial fibrillation (HCC) - continue his Eliquis for anticoagulation. resume amiodarone at discharge  Dyslipidemia - continue statin therapy.  Vitamin B12 deficiency - continue vitamin B12.  Alcohol abuse          Consultants: Psychiatry Disposition: Home Diet recommendation:  Discharge Diet Orders (From admission, onward)     Start     Ordered   07/06/22 0000  Diet - low sodium heart healthy        07/06/22 0827           Carb modified diet DISCHARGE MEDICATION: Allergies as of 07/06/2022   No Known Allergies      Medication List     STOP taking these medications    cyanocobalamin 1000 MCG tablet Commonly known as: VITAMIN B12   FLUoxetine 40 MG capsule Commonly known as: PROZAC   mupirocin ointment 2 % Commonly known as: BACTROBAN       TAKE these medications    amiodarone 200 MG tablet Commonly known as: PACERONE Take 1 tablet (200 mg total) by mouth daily.   amLODipine 5 MG tablet Commonly known as: NORVASC Take 1 tablet (5 mg total) by mouth daily. What changed:  medication strength how much to take   apixaban 5 MG Tabs tablet Commonly known as: ELIQUIS Take 1 tablet (5 mg total) by mouth 2 (two) times daily.   buPROPion 150 MG 24 hr  tablet Commonly known as: WELLBUTRIN XL Take 1 tablet (150 mg total) by mouth daily.   chlorthalidone 25 MG tablet Commonly known as: HYGROTON Take 1 tablet (25 mg total) by mouth daily.   furosemide 40 MG tablet Commonly known as: LASIX Take 1 tablet (40 mg total) by mouth daily.   gabapentin 300 MG capsule Commonly known as: NEURONTIN Take 1 capsule (300 mg total) by mouth 3 (three) times daily.   hydrALAZINE 100  MG tablet Commonly known as: APRESOLINE Take 1 tablet (100 mg total) by mouth 3 (three) times daily.   metoprolol tartrate 50 MG tablet Commonly known as: LOPRESSOR Take 1 tablet (50 mg total) by mouth 2 (two) times daily.   QUEtiapine 100 MG tablet Commonly known as: SEROQUEL Take 1 tablet (100 mg total) by mouth at bedtime.   rosuvastatin 20 MG tablet Commonly known as: CRESTOR Take 1 tablet (20 mg total) by mouth at bedtime.   valsartan 320 MG tablet Commonly known as: DIOVAN Take 1 tablet (320 mg total) by mouth every morning.   venlafaxine XR 75 MG 24 hr capsule Commonly known as: EFFEXOR-XR Take 1 capsule (75 mg total) by mouth daily with breakfast.        Follow-up Information     Jodi Marble, MD. Schedule an appointment as soon as possible for a visit in 3 day(s).   Specialty: Internal Medicine Why: [facility] Discharge F/UP Contact information: [place] [address] [phone]         [place]ine. Schedule an appointment as soon as possible for a visit in 1 week(s).   Why: If symptoms worsen, As needed Contact information: [address] [place] [address] [phone]                Discharge Exam: Filed Weights   07/02/22 2256  Weight: 49 kg   56 year old male sitting in the bed comfortably without any acute distress.  He is somewhat shaky Lungs clear to auscultation bilaterally Cardiovascular S1-S2 normal Abdomen soft, benign Neuro alert and oriented, nonfocal Skin no rash or lesions Psych normal mood and affect  Condition at discharge: fair  The results of significant diagnostics from this hospitalization (including imaging, microbiology, ancillary and laboratory) are listed below for reference.   Imaging Studies: No results found.  Microbiology: Results for orders placed or performed during the hospital encounter of 06/30/22  SARS Coronavirus 2 by RT PCR (hospital order,  performed in [facility] hospital lab) *cepheid single result test* Urine, Clean Catch     Status: None   Collection Time: 06/30/22 12:44 PM   Specimen: Urine, Clean Catch; Nasal Swab  Result Value Ref Range Status   SARS Coronavirus 2 by RT PCR NEGATIVE NEGATIVE Final    Comment: (NOTE) SARS-CoV-2 target nucleic acids are NOT DETECTED.  The SARS-CoV-2 RNA is generally detectable in upper and lower respiratory specimens during the acute phase of infection. The lowest concentration of SARS-CoV-2 viral copies this assay can detect is 250 copies / mL. A negative result does not preclude SARS-CoV-2 infection and should not be used as the sole basis for treatment or other patient management decisions.  A negative result may occur with improper specimen collection / handling, submission of specimen other than nasopharyngeal swab, presence of viral mutation(s) within the areas targeted by this assay, and inadequate number of viral copies (<250 copies / mL). A negative result must be combined with clinical observations, patient history, and epidemiological information.  Fact Sheet for Patients:   RoadLapTop.co.za  Fact Sheet for Healthcare Providers: http://kim-miller.com/  This test is not yet approved or  cleared by the [place] FDA and has been authorized for detection and/or diagnosis of SARS-CoV-2 by FDA under an Emergency Use Authorization (EUA).  This EUA will remain in effect (meaning this test can be used) for the duration of the COVID-19 declaration under Section 564(b)(1) of the Act, 21 U.S.C. section 360bbb-3(b)(1), unless the authorization is terminated or revoked sooner.  Performed at [facility], [address] [place], [place] [address]     Labs: CBC: Recent Labs  Lab 07/02/22 2035 07/03/22 [phone] 07/04/22 0521 07/05/22 0452 07/06/22 0504  WBC 4.9 4.7 4.9 5.1 5.9  NEUTROABS 2.9  --   --   --   --    HGB 11.7* 11.2* 10.4* 10.7* 10.7*  HCT 34.3* 33.3* 31.3* 32.2* 31.5*  MCV 94.5 94.9 96.6 96.7 95.5  PLT 156 154 159 185 184   Basic Metabolic Panel: Recent Labs  Lab 07/02/22 2035 07/03/22 0621 07/04/22 0521 07/05/22 0452 07/06/22 0504  NA 125* 126* 131* 133* 131*  K 3.5 4.0 4.0 3.8 4.0  CL 82* 87* 94* 96* 93*  CO2 31 29 30 29 29   GLUCOSE 90 103* 99 99 93  BUN 31* 32* 25* 22* 20  CREATININE 2.36* 2.18* 1.69* 1.25* 1.19  CALCIUM 9.9 9.1 8.9 9.2 9.3  MG  --  1.6* 1.9  --   --    Liver Function Tests: Recent Labs  Lab 07/02/22 2035  AST 23  ALT 14  ALKPHOS 60  BILITOT 1.0  PROT 7.4  ALBUMIN 3.8   CBG: Recent Labs  Lab 07/02/22 1623  GLUCAP 119*    Discharge time spent: greater than 30 minutes.  Signed: 07/04/22, MD [facility] 07/07/2022

## 2022-07-12 ENCOUNTER — Other Ambulatory Visit: Payer: Self-pay

## 2022-07-12 NOTE — Patient Outreach (Signed)
[facility]) Care Management  07/12/2022  Andrew Sparks 1966-09-22 [id number]   *Referral: [facility] Readmission Report for review, remote review patient was at [facility]  [facility] [[facility]]  Accountable Care Organization [ACO] Patient: Medicare ACO REACH  PCP:  Sherron Monday, MD, [facility]  Brief review for admission and information sent for readmission review.  Charlesetta Shanks, RN BSN CCM [facility]  [phone] business mobile phone Toll free office [phone]  Fax number: [phone] [email or web address]

## 2022-09-01 DIAGNOSIS — E559 Vitamin D deficiency, unspecified: Secondary | ICD-10-CM | POA: Diagnosis not present

## 2022-09-01 DIAGNOSIS — R7989 Other specified abnormal findings of blood chemistry: Secondary | ICD-10-CM | POA: Diagnosis not present

## 2022-09-01 DIAGNOSIS — I63549 Cerebral infarction due to unspecified occlusion or stenosis of unspecified cerebellar artery: Secondary | ICD-10-CM | POA: Diagnosis not present

## 2022-09-04 DIAGNOSIS — M5412 Radiculopathy, cervical region: Secondary | ICD-10-CM | POA: Diagnosis not present

## 2022-09-04 DIAGNOSIS — Z23 Encounter for immunization: Secondary | ICD-10-CM | POA: Diagnosis not present

## 2022-09-04 DIAGNOSIS — R7989 Other specified abnormal findings of blood chemistry: Secondary | ICD-10-CM | POA: Diagnosis not present

## 2022-09-04 DIAGNOSIS — I10 Essential (primary) hypertension: Secondary | ICD-10-CM | POA: Diagnosis not present

## 2022-09-04 DIAGNOSIS — I63549 Cerebral infarction due to unspecified occlusion or stenosis of unspecified cerebellar artery: Secondary | ICD-10-CM | POA: Diagnosis not present

## 2022-09-04 DIAGNOSIS — R69 Illness, unspecified: Secondary | ICD-10-CM | POA: Diagnosis not present

## 2022-09-04 DIAGNOSIS — N183 Chronic kidney disease, stage 3 unspecified: Secondary | ICD-10-CM | POA: Diagnosis not present

## 2022-09-04 DIAGNOSIS — G629 Polyneuropathy, unspecified: Secondary | ICD-10-CM | POA: Diagnosis not present

## 2022-09-04 DIAGNOSIS — G4731 Primary central sleep apnea: Secondary | ICD-10-CM | POA: Diagnosis not present

## 2022-09-04 DIAGNOSIS — I509 Heart failure, unspecified: Secondary | ICD-10-CM | POA: Diagnosis not present

## 2022-09-04 DIAGNOSIS — K739 Chronic hepatitis, unspecified: Secondary | ICD-10-CM | POA: Diagnosis not present

## 2022-09-04 DIAGNOSIS — E876 Hypokalemia: Secondary | ICD-10-CM | POA: Diagnosis not present

## 2022-09-04 DIAGNOSIS — E559 Vitamin D deficiency, unspecified: Secondary | ICD-10-CM | POA: Diagnosis not present

## 2022-09-08 DIAGNOSIS — E876 Hypokalemia: Secondary | ICD-10-CM | POA: Diagnosis not present

## 2022-10-23 DIAGNOSIS — N183 Chronic kidney disease, stage 3 unspecified: Secondary | ICD-10-CM | POA: Diagnosis not present

## 2022-10-23 DIAGNOSIS — L89150 Pressure ulcer of sacral region, unstageable: Secondary | ICD-10-CM | POA: Diagnosis not present

## 2022-10-23 DIAGNOSIS — R7989 Other specified abnormal findings of blood chemistry: Secondary | ICD-10-CM | POA: Diagnosis not present

## 2022-10-23 DIAGNOSIS — I509 Heart failure, unspecified: Secondary | ICD-10-CM | POA: Diagnosis not present

## 2022-10-23 DIAGNOSIS — G4731 Primary central sleep apnea: Secondary | ICD-10-CM | POA: Diagnosis not present

## 2022-10-23 DIAGNOSIS — M5412 Radiculopathy, cervical region: Secondary | ICD-10-CM | POA: Diagnosis not present

## 2022-10-23 DIAGNOSIS — I10 Essential (primary) hypertension: Secondary | ICD-10-CM | POA: Diagnosis not present

## 2022-10-23 DIAGNOSIS — I63549 Cerebral infarction due to unspecified occlusion or stenosis of unspecified cerebellar artery: Secondary | ICD-10-CM | POA: Diagnosis not present

## 2022-10-23 DIAGNOSIS — E876 Hypokalemia: Secondary | ICD-10-CM | POA: Diagnosis not present

## 2022-10-23 DIAGNOSIS — G629 Polyneuropathy, unspecified: Secondary | ICD-10-CM | POA: Diagnosis not present

## 2022-10-23 DIAGNOSIS — R69 Illness, unspecified: Secondary | ICD-10-CM | POA: Diagnosis not present

## 2022-10-23 DIAGNOSIS — K739 Chronic hepatitis, unspecified: Secondary | ICD-10-CM | POA: Diagnosis not present

## 2022-10-29 ENCOUNTER — Emergency Department: Payer: Medicare HMO

## 2022-10-29 ENCOUNTER — Encounter: Payer: Self-pay | Admitting: Radiology

## 2022-10-29 ENCOUNTER — Observation Stay: Payer: Medicare HMO

## 2022-10-29 ENCOUNTER — Other Ambulatory Visit: Payer: Self-pay

## 2022-10-29 ENCOUNTER — Observation Stay
Admission: EM | Admit: 2022-10-29 | Discharge: 2022-10-30 | Disposition: A | Discharge status: Home Health | Payer: Medicare HMO | Attending: Internal Medicine | Admitting: Internal Medicine

## 2022-10-29 DIAGNOSIS — R2681 Unsteadiness on feet: Secondary | ICD-10-CM | POA: Diagnosis not present

## 2022-10-29 DIAGNOSIS — I480 Paroxysmal atrial fibrillation: Secondary | ICD-10-CM | POA: Insufficient documentation

## 2022-10-29 DIAGNOSIS — G9341 Metabolic encephalopathy: Secondary | ICD-10-CM

## 2022-10-29 DIAGNOSIS — I5032 Chronic diastolic (congestive) heart failure: Secondary | ICD-10-CM | POA: Insufficient documentation

## 2022-10-29 DIAGNOSIS — Z7901 Long term (current) use of anticoagulants: Secondary | ICD-10-CM | POA: Diagnosis not present

## 2022-10-29 DIAGNOSIS — R4182 Altered mental status, unspecified: Secondary | ICD-10-CM | POA: Insufficient documentation

## 2022-10-29 DIAGNOSIS — R569 Unspecified convulsions: Principal | ICD-10-CM

## 2022-10-29 DIAGNOSIS — I110 Hypertensive heart disease with heart failure: Secondary | ICD-10-CM | POA: Diagnosis not present

## 2022-10-29 DIAGNOSIS — F32A Depression, unspecified: Secondary | ICD-10-CM | POA: Diagnosis present

## 2022-10-29 DIAGNOSIS — R7989 Other specified abnormal findings of blood chemistry: Secondary | ICD-10-CM | POA: Diagnosis not present

## 2022-10-29 DIAGNOSIS — F17210 Nicotine dependence, cigarettes, uncomplicated: Secondary | ICD-10-CM | POA: Diagnosis not present

## 2022-10-29 DIAGNOSIS — I160 Hypertensive urgency: Secondary | ICD-10-CM | POA: Insufficient documentation

## 2022-10-29 DIAGNOSIS — M6281 Muscle weakness (generalized): Secondary | ICD-10-CM | POA: Diagnosis not present

## 2022-10-29 DIAGNOSIS — S0990XA Unspecified injury of head, initial encounter: Secondary | ICD-10-CM | POA: Diagnosis not present

## 2022-10-29 DIAGNOSIS — G4733 Obstructive sleep apnea (adult) (pediatric): Secondary | ICD-10-CM | POA: Diagnosis present

## 2022-10-29 DIAGNOSIS — Z8673 Personal history of transient ischemic attack (TIA), and cerebral infarction without residual deficits: Secondary | ICD-10-CM | POA: Insufficient documentation

## 2022-10-29 DIAGNOSIS — F1090 Alcohol use, unspecified, uncomplicated: Secondary | ICD-10-CM | POA: Insufficient documentation

## 2022-10-29 DIAGNOSIS — R079 Chest pain, unspecified: Secondary | ICD-10-CM | POA: Diagnosis present

## 2022-10-29 DIAGNOSIS — I2510 Atherosclerotic heart disease of native coronary artery without angina pectoris: Secondary | ICD-10-CM | POA: Insufficient documentation

## 2022-10-29 DIAGNOSIS — E871 Hypo-osmolality and hyponatremia: Secondary | ICD-10-CM | POA: Diagnosis present

## 2022-10-29 DIAGNOSIS — I10 Essential (primary) hypertension: Secondary | ICD-10-CM | POA: Diagnosis not present

## 2022-10-29 DIAGNOSIS — Z79899 Other long term (current) drug therapy: Secondary | ICD-10-CM | POA: Diagnosis not present

## 2022-10-29 DIAGNOSIS — G459 Transient cerebral ischemic attack, unspecified: Secondary | ICD-10-CM | POA: Diagnosis not present

## 2022-10-29 DIAGNOSIS — M4312 Spondylolisthesis, cervical region: Secondary | ICD-10-CM | POA: Diagnosis not present

## 2022-10-29 DIAGNOSIS — I69354 Hemiplegia and hemiparesis following cerebral infarction affecting left non-dominant side: Secondary | ICD-10-CM | POA: Insufficient documentation

## 2022-10-29 DIAGNOSIS — Z743 Need for continuous supervision: Secondary | ICD-10-CM | POA: Diagnosis not present

## 2022-10-29 LAB — URINE DRUG SCREEN, QUALITATIVE (ARMC ONLY)
Amphetamines, Ur Screen: NOT DETECTED
Barbiturates, Ur Screen: NOT DETECTED
Benzodiazepine, Ur Scrn: NOT DETECTED
Cannabinoid 50 Ng, Ur ~~LOC~~: NOT DETECTED
Cocaine Metabolite,Ur ~~LOC~~: NOT DETECTED
MDMA (Ecstasy)Ur Screen: NOT DETECTED
Methadone Scn, Ur: NOT DETECTED
Opiate, Ur Screen: NOT DETECTED
Phencyclidine (PCP) Ur S: NOT DETECTED
Tricyclic, Ur Screen: NOT DETECTED

## 2022-10-29 LAB — COMPREHENSIVE METABOLIC PANEL
ALT: 22 U/L (ref 0–44)
AST: 37 U/L (ref 15–41)
Albumin: 4 g/dL (ref 3.5–5.0)
Alkaline Phosphatase: 71 U/L (ref 38–126)
Anion gap: 11 (ref 5–15)
BUN: 25 mg/dL — ABNORMAL HIGH (ref 6–20)
CO2: 30 mmol/L (ref 22–32)
Calcium: 10 mg/dL (ref 8.9–10.3)
Chloride: 90 mmol/L — ABNORMAL LOW (ref 98–111)
Creatinine, Ser: 1.21 mg/dL (ref 0.61–1.24)
GFR, Estimated: >60 mL/min (ref >60)
Glucose, Bld: 98 mg/dL (ref 70–99)
Potassium: 3.3 mmol/L — ABNORMAL LOW (ref 3.5–5.1)
Sodium: 131 mmol/L — ABNORMAL LOW (ref 135–145)
Total Bilirubin: 1.4 mg/dL — ABNORMAL HIGH (ref 0.3–1.2)
Total Protein: 8.4 g/dL — ABNORMAL HIGH (ref 6.5–8.1)

## 2022-10-29 LAB — CK: Total CK: 286 U/L (ref 49–397)

## 2022-10-29 LAB — TROPONIN I (HIGH SENSITIVITY)
Troponin I (High Sensitivity): 18 ng/L — ABNORMAL HIGH (ref <18)
Troponin I (High Sensitivity): 29 ng/L — ABNORMAL HIGH (ref <18)

## 2022-10-29 LAB — URINALYSIS, ROUTINE W REFLEX MICROSCOPIC
Bilirubin Urine: NEGATIVE
Glucose, UA: NEGATIVE mg/dL
Hgb urine dipstick: NEGATIVE
Ketones, ur: NEGATIVE mg/dL
Nitrite: NEGATIVE
Protein, ur: >=300 mg/dL — AB
Specific Gravity, Urine: 1.012 (ref 1.005–1.030)
pH: 7 (ref 5.0–8.0)

## 2022-10-29 LAB — CBC WITH DIFFERENTIAL/PLATELET
Abs Immature Granulocytes: 0.03 10*3/uL (ref 0.00–0.07)
Basophils Absolute: 0 10*3/uL (ref 0.0–0.1)
Basophils Relative: 1 %
Eosinophils Absolute: 0.1 10*3/uL (ref 0.0–0.5)
Eosinophils Relative: 3 %
HCT: 41.5 % (ref 39.0–52.0)
Hemoglobin: 14.7 g/dL (ref 13.0–17.0)
Immature Granulocytes: 1 %
Lymphocytes Relative: 19 %
Lymphs Abs: 0.6 10*3/uL — ABNORMAL LOW (ref 0.7–4.0)
MCH: 33.3 pg (ref 26.0–34.0)
MCHC: 35.4 g/dL (ref 30.0–36.0)
MCV: 94.1 fL (ref 80.0–100.0)
Monocytes Absolute: 0.5 10*3/uL (ref 0.1–1.0)
Monocytes Relative: 17 %
Neutro Abs: 1.9 10*3/uL (ref 1.7–7.7)
Neutrophils Relative %: 59 %
Platelets: 223 10*3/uL (ref 150–400)
RBC: 4.41 MIL/uL (ref 4.22–5.81)
RDW: 13.1 % (ref 11.5–15.5)
WBC: 3.1 10*3/uL — ABNORMAL LOW (ref 4.0–10.5)
nRBC: 0 % (ref 0.0–0.2)

## 2022-10-29 LAB — PROTIME-INR
INR: 1.1 (ref 0.8–1.2)
Prothrombin Time: 13.7 seconds (ref 11.4–15.2)

## 2022-10-29 LAB — APTT: aPTT: 22 seconds — ABNORMAL LOW (ref 24–36)

## 2022-10-29 LAB — ETHANOL: Alcohol, Ethyl (B): <10 mg/dL (ref <10)

## 2022-10-29 MED ORDER — SODIUM CHLORIDE 0.9 % IV SOLN
75.0000 mL/h | INTRAVENOUS | Status: DC
  Administered 2022-10-29: 75 mL/h via INTRAVENOUS

## 2022-10-29 MED ORDER — RIVAROXABAN 20 MG PO TABS
20.0000 mg | ORAL_TABLET | Freq: Every day | ORAL | Status: DC
  Administered 2022-10-30: 20 mg via ORAL
  Filled 2022-10-29: qty 1

## 2022-10-29 MED ORDER — THIAMINE MONONITRATE 100 MG PO TABS
100.0000 mg | ORAL_TABLET | Freq: Every day | ORAL | Status: DC
  Administered 2022-10-29 – 2022-10-30 (×2): 100 mg via ORAL
  Filled 2022-10-29 (×2): qty 1

## 2022-10-29 MED ORDER — ONDANSETRON HCL 4 MG/2ML IJ SOLN: 4.0000 mg | Freq: Four times a day (QID) | INTRAMUSCULAR | Status: DC | PRN

## 2022-10-29 MED ORDER — IRBESARTAN 150 MG PO TABS: 150.0000 mg | ORAL_TABLET | Freq: Every day | ORAL | Status: DC

## 2022-10-29 MED ORDER — LORAZEPAM 2 MG/ML IJ SOLN: 2.0000 mg | INTRAMUSCULAR | Status: DC | PRN

## 2022-10-29 MED ORDER — HYDRALAZINE HCL 100 MG PO TABS: 100.0000 mg | ORAL_TABLET | Freq: Three times a day (TID) | ORAL | Status: DC

## 2022-10-29 MED ORDER — LORAZEPAM 1 MG PO TABS: 1.0000 mg | ORAL_TABLET | ORAL | Status: DC | PRN

## 2022-10-29 MED ORDER — APIXABAN 5 MG PO TABS: 5.0000 mg | ORAL_TABLET | Freq: Two times a day (BID) | ORAL | Status: DC

## 2022-10-29 MED ORDER — THIAMINE HCL 100 MG/ML IJ SOLN
100.0000 mg | Freq: Every day | INTRAMUSCULAR | Status: DC
  Filled 2022-10-29: qty 2

## 2022-10-29 MED ORDER — ACETAMINOPHEN 325 MG RE SUPP: 650.0000 mg | RECTAL | Status: DC | PRN

## 2022-10-29 MED ORDER — ONDANSETRON HCL 4 MG PO TABS: 4.0000 mg | ORAL_TABLET | Freq: Four times a day (QID) | ORAL | Status: DC | PRN

## 2022-10-29 MED ORDER — LORAZEPAM 2 MG/ML IJ SOLN: 1.0000 mg | INTRAMUSCULAR | Status: DC | PRN

## 2022-10-29 MED ORDER — ADULT MULTIVITAMIN W/MINERALS CH
1.0000 | ORAL_TABLET | Freq: Every day | ORAL | Status: DC
  Administered 2022-10-29 – 2022-10-30 (×2): 1 via ORAL
  Filled 2022-10-29 (×2): qty 1

## 2022-10-29 MED ORDER — ORAL CARE MOUTH RINSE
15.0000 mL | OROMUCOSAL | Status: DC
  Administered 2022-10-30 (×4): 15 mL via OROMUCOSAL
  Filled 2022-10-29 (×19): qty 15

## 2022-10-29 MED ORDER — BUPROPION HCL ER (XL) 150 MG PO TB24: 150.0000 mg | ORAL_TABLET | Freq: Every day | ORAL | Status: DC

## 2022-10-29 MED ORDER — FUROSEMIDE 40 MG PO TABS: 40.0000 mg | ORAL_TABLET | Freq: Every day | ORAL | Status: DC

## 2022-10-29 MED ORDER — FOLIC ACID 1 MG PO TABS
1.0000 mg | ORAL_TABLET | Freq: Every day | ORAL | Status: DC
  Administered 2022-10-29 – 2022-10-30 (×2): 1 mg via ORAL
  Filled 2022-10-29 (×2): qty 1

## 2022-10-29 MED ORDER — AMIODARONE HCL 200 MG PO TABS
200.0000 mg | ORAL_TABLET | Freq: Every day | ORAL | Status: DC
  Administered 2022-10-30: 200 mg via ORAL
  Filled 2022-10-29 (×2): qty 1

## 2022-10-29 MED ORDER — ROSUVASTATIN CALCIUM 20 MG PO TABS
20.0000 mg | ORAL_TABLET | Freq: Every day | ORAL | Status: DC
  Filled 2022-10-29 (×3): qty 1

## 2022-10-29 MED ORDER — VENLAFAXINE HCL ER 75 MG PO CP24: 75.0000 mg | ORAL_CAPSULE | Freq: Every day | ORAL | Status: DC

## 2022-10-29 MED ORDER — ASPIRIN 325 MG PO TABS
325.0000 mg | ORAL_TABLET | Freq: Once | ORAL | Status: AC
  Administered 2022-10-29: 325 mg via ORAL
  Filled 2022-10-29: qty 1

## 2022-10-29 MED ORDER — ORAL CARE MOUTH RINSE: 15.0000 mL | OROMUCOSAL | Status: DC | PRN

## 2022-10-29 MED ORDER — ACETAMINOPHEN 325 MG PO TABS: 650.0000 mg | ORAL_TABLET | ORAL | Status: DC | PRN

## 2022-10-29 MED ORDER — QUETIAPINE FUMARATE 25 MG PO TABS: 100.0000 mg | ORAL_TABLET | Freq: Every day | ORAL | Status: DC

## 2022-10-29 MED ORDER — METOPROLOL TARTRATE 50 MG PO TABS: 50.0000 mg | ORAL_TABLET | Freq: Two times a day (BID) | ORAL | Status: DC

## 2022-10-29 MED ORDER — LEVETIRACETAM IN NACL 500 MG/100ML IV SOLN
500.0000 mg | Freq: Once | INTRAVENOUS | Status: AC
  Administered 2022-10-29: 500 mg via INTRAVENOUS
  Filled 2022-10-29: qty 100

## 2022-10-29 MED ORDER — AMLODIPINE BESYLATE 5 MG PO TABS: 5.0000 mg | ORAL_TABLET | Freq: Every day | ORAL | Status: DC

## 2022-10-29 MED ORDER — FLUOXETINE HCL 20 MG PO CAPS
40.0000 mg | ORAL_CAPSULE | Freq: Every day | ORAL | Status: DC
  Administered 2022-10-30: 40 mg via ORAL
  Filled 2022-10-29: qty 2

## 2022-10-29 MED ORDER — STROKE: EARLY STAGES OF RECOVERY BOOK: Freq: Once | Status: DC

## 2022-10-29 NOTE — Assessment & Plan Note (Signed)
BP 152/117 Hold home meds to allow for permissive hypertension in case of TIA

## 2022-10-29 NOTE — ED Provider Notes (Signed)
[facility] Provider Note    Event Date/Time   First MD Initiated Contact with Patient 10/29/22 1659     (approximate)   History   Chest Pain   HPI  Andrew Sparks is a 56 y.o. male past medical history significant for prior CVA with residual left-sided deficits, hypertension, alcohol use disorder, presents to the emergency department with altered mental status and an episode of being found down.  Patient states he does not recall what happened and he remembered being up this morning but then does not recall what happened after that.  Complaining of some mild chest pain.  States that it is 2009.  Believes he is at the hospital.  Discussed with his girlfriend, [name], [phone]  Stated that he was in his normal state of health this morning when he got up she went to church and when she came back he was laying on the ground in the kitchen.  States that he had snoring respirations and looked like he was gasping for air.  The stove was on.  Called 911.  States that he would normally know the year and not be confused.  No known history of a seizure disorder.  Does state that he had 1 prior episode a couple of months ago where he was sitting in the park, his eyes rolled back in his head and he had generalized shaking for maybe 30 seconds to a minute and then went back to his normal.     Physical Exam   Triage Vital Signs: ED Triage Vitals [10/29/22 1424]  Enc Vitals Group     BP (!) 151/121     Pulse Rate (!) 56     Resp 18     Temp 97.7 F (36.5 C)     Temp Source Oral     SpO2 97 %     Weight      Height      Head Circumference      Peak Flow      Pain Score      Pain Loc      Pain Edu?      Excl. in [place]?     Most recent vital signs: Vitals:   10/29/22 1800 10/29/22 1843  BP: (!) 152/117   Pulse: (!) 55   Resp: 17   Temp:  98 F (36.7 C)  SpO2: 97%     Physical Exam Constitutional:      Appearance: He is well-developed.  HENT:      Head: Atraumatic.  Eyes:     Conjunctiva/sclera: Conjunctivae normal.  Cardiovascular:     Rate and Rhythm: Regular rhythm.  Pulmonary:     Effort: No respiratory distress.  Musculoskeletal:     Cervical back: Normal range of motion.  Skin:    General: Skin is warm.  Neurological:     Mental Status: He is alert. Mental status is at baseline.     Comments: Weakness to the left upper extremity when compared to the right.  Cranial nerves intact.  States that it is 2009.          IMPRESSION / MDM / ASSESSMENT AND PLAN / ED COURSE  I reviewed the triage vital signs and the nursing notes.  Differential diagnosis including CVA, intracranial hemorrhage, infectious process, electrolyte abnormality, alcohol intoxication, seizure disorder, dysrhythmia    EKG  EKG with significant artifact  No tachycardic or bradycardic dysrhythmias while on cardiac telemetry  RADIOLOGY I independently reviewed  imaging, my interpretation of imaging: CT head with no signs of intracranial hemorrhage or infarction      ED Results / Procedures / Treatments   Labs (all labs ordered are listed, but only abnormal results are displayed) Labs interpreted as -  No significant leukocytosis.  Creatinine at baseline.  No significant electrolyte abnormalities.  No obvious source of an infectious process.  Mildly elevated troponin however on chart review of his outside records patient has had an elevated troponin in the past.  Labs Reviewed  COMPREHENSIVE METABOLIC PANEL - Abnormal; Notable for the following components:      Result Value   Sodium 131 (*)    Potassium 3.3 (*)    Chloride 90 (*)    BUN 25 (*)    Total Protein 8.4 (*)    Total Bilirubin 1.4 (*)    All other components within normal limits  CBC WITH DIFFERENTIAL/PLATELET - Abnormal; Notable for the following components:   WBC 3.1 (*)    Lymphs Abs 0.6 (*)    All other components within normal limits  APTT - Abnormal; Notable for  the following components:   aPTT 22 (*)    All other components within normal limits  URINALYSIS, ROUTINE W REFLEX MICROSCOPIC - Abnormal; Notable for the following components:   Color, Urine YELLOW (*)    APPearance HAZY (*)    Protein, ur >=300 (*)    Leukocytes,Ua MODERATE (*)    Bacteria, UA RARE (*)    All other components within normal limits  TROPONIN I (HIGH SENSITIVITY) - Abnormal; Notable for the following components:   Troponin I (High Sensitivity) 18 (*)    All other components within normal limits  TROPONIN I (HIGH SENSITIVITY) - Abnormal; Notable for the following components:   Troponin I (High Sensitivity) 29 (*)    All other components within normal limits  URINE CULTURE  PROTIME-INR  CK  ETHANOL  URINE DRUG SCREEN, QUALITATIVE ([facility] ONLY)  LIPID PANEL  HEMOGLOBIN A1C    On reevaluation continues to have altered mental status.  Concern for possible seizure versus new CVA however no focal deficits.  No concern for an LVO.  Consulted the hospitalist for admission for altered mental status and an episode of being found down.   PROCEDURES:  Critical Care performed: No  Procedures  Patient's presentation is most consistent with acute presentation with potential threat to life or bodily function.   MEDICATIONS ORDERED IN ED: Medications  amiodarone (PACERONE) tablet 200 mg (has no administration in time range)  rosuvastatin (CRESTOR) tablet 20 mg (has no administration in time range)  buPROPion (WELLBUTRIN XL) 24 hr tablet 150 mg (has no administration in time range)  QUEtiapine (SEROQUEL) tablet 100 mg (has no administration in time range)  venlafaxine XR (EFFEXOR-XR) 24 hr capsule 75 mg (has no administration in time range)  apixaban (ELIQUIS) tablet 5 mg (has no administration in time range)  Oral care mouth rinse (has no administration in time range)  Oral care mouth rinse (has no administration in time range)  0.9 %  sodium chloride infusion (has no  administration in time range)  LORazepam (ATIVAN) injection 2 mg (has no administration in time range)  levETIRAcetam (KEPPRA) IVPB 500 mg/100 mL premix (has no administration in time range)  acetaminophen (TYLENOL) tablet 650 mg (has no administration in time range)    Or  acetaminophen (TYLENOL) suppository 650 mg (has no administration in time range)  ondansetron (ZOFRAN) tablet 4 mg (has no administration  in time range)    Or  ondansetron (ZOFRAN) injection 4 mg (has no administration in time range)  LORazepam (ATIVAN) tablet 1-4 mg (has no administration in time range)    Or  LORazepam (ATIVAN) injection 1-4 mg (has no administration in time range)  thiamine (VITAMIN B1) tablet 100 mg (has no administration in time range)    Or  thiamine (VITAMIN B1) injection 100 mg (has no administration in time range)  folic acid (FOLVITE) tablet 1 mg (has no administration in time range)  multivitamin with minerals tablet 1 tablet (has no administration in time range)   stroke: early stages of recovery book (has no administration in time range)  aspirin tablet 325 mg (has no administration in time range)    FINAL CLINICAL IMPRESSION(S) / ED DIAGNOSES   Final diagnoses:  Altered mental status, unspecified altered mental status type     Rx / DC Orders   ED Discharge Orders     None        Note:  This document was prepared using Dragon voice recognition software and may include unintentional dictation errors.   Nathaniel Man, MD 10/29/22 2150

## 2022-10-29 NOTE — Assessment & Plan Note (Addendum)
Possible seizure/TIA. Etiology uncertain, differential will include seizure given urinary incontinence as a well as TIA, as well as as medication related as patient noted to be on multiple psychotropics and antihypertensives For possible seizure: Loaded with IV Keppra 500, seizure precautions, EEG, neurology consult For possible TIA: Neurologic checks, aspirin 325, continue Eliquis and rosuvastatin, neurology consult, PT and OT consult Continuous cardiac monitoring, echocardiogram and MRI, carotid Doppler Neurologic checks and aspiration and fall precautions

## 2022-10-29 NOTE — Assessment & Plan Note (Signed)
Supportive care Continue Eliquis Holding antihypertensives to allow for permissive hypertension

## 2022-10-29 NOTE — Assessment & Plan Note (Addendum)
Chest pain Patient had chest pain on arrival mild elevation to 29.   Continue to trend troponin.  Initial EKG appears nonacute but repeating due to artifact ACS not suspected

## 2022-10-29 NOTE — ED Provider Triage Note (Signed)
  Emergency Medicine Provider Triage Evaluation Note  Andrew Sparks , a 56 y.o.male,  was evaluated in triage.  Pt complains of injuries from fall.  Patient states that he has neuropathy in his feet and falls quite frequently.  He states he did have a fall today when she fell forward landing on the ground.  He states that he feels fine, but does states that his chest hurts.  He additionally states that he feels weak in his left arm, but states that this been going on for few months.  EMS states that he was reportedly found on the floor by his girlfriend while at church.  They state that he is disoriented he urinated on himself.   Review of Systems  Positive: Weakness, chest pain, Negative: Denies fever, chest pain, vomiting  Physical Exam   Vitals:   10/29/22 1424  BP: (!) 151/121  Pulse: (!) 56  Resp: 18  Temp: 97.7 F (36.5 C)  SpO2: 97%   Gen:   Awake, no distress   Resp:  Normal effort  MSK:   Moves extremities without difficulty  Other:  Mild weakness appreciated in the left upper extremity, decreased grip strength.  He does appear to have some mild facial droop on left side.  Medical Decision Making  Given the patient's initial medical screening exam, the following diagnostic evaluation has been ordered. The patient will be placed in the appropriate treatment space, once one is available, to complete the evaluation and treatment. I have discussed the plan of care with the patient and I have advised the patient that an ED physician or mid-level practitioner will reevaluate their condition after the test results have been received, as the results may give them additional insight into the type of treatment they may need.    Diagnostics: Labs, head CT, cervical spine CT, UA, CXR  Treatments: none immediately   Varney Daily, [place] 10/29/22 1429

## 2022-10-29 NOTE — ED Triage Notes (Signed)
Pt reports chest pain and left sided weakness that started "today". No specific time given, PA assessed pt in triage.   Pt will answer some questions, but not all questions in triage. Pt has some confusion, believes it is 2009

## 2022-10-29 NOTE — ED Triage Notes (Signed)
Pt presents via EMS. EMS reports pt found down on floor by his girlfriend while at church. EMS reports pt is disoriented. EMS report pt urinated on himself.

## 2022-10-29 NOTE — Assessment & Plan Note (Signed)
Continue fluoxetine 40 mg daily  Updated med rec showing patient longer on Wellbutrin or venlafaxine

## 2022-10-29 NOTE — Assessment & Plan Note (Signed)
Clinically euvolemic to dry Will hold furosemide and [place] on and metoprolol and valsartan to allow for permissive hypertension Daily weights

## 2022-10-29 NOTE — Assessment & Plan Note (Signed)
CIWA withdrawal protocol 

## 2022-10-29 NOTE — Assessment & Plan Note (Signed)
Appears chronic and at baseline.  Baseline 125-133 Possibly related to alcohol use

## 2022-10-29 NOTE — Assessment & Plan Note (Signed)
-   Continue amiodarone and Eliquis 

## 2022-10-29 NOTE — H&P (Signed)
History and Physical    Patient: Andrew Sparks ERX:[id number] DOB: Oct 24, 1966 DOA: 10/29/2022 DOS: the patient was seen and examined on 10/29/2022 PCP: Sherron Monday, MD  Patient coming from: Home  Chief Complaint:  Chief Complaint  Patient presents with   Chest Pain    HPI: Andrew Sparks is a 56 y.o. male with medical history significant for paroxysmal atrial fibrillation, on Xarelto, essential hypertension, coronary artery disease, obstructive sleep apnea not on home CPAP, diastolic CHF, CVA, hyponatremia, history of major depression with psychotic features admitted to [facility] psych unit on 7/28 , alcohol use disorder who presents with chest pain, left sided weakness and confusion after he was found down on the floor by his girlfriend while at church.  He was disoriented with EMS reportedly urinated on himself.  At the time of my assessment patient was awake and alert and oriented x3.  He denies having been confused stating that his knees gave out when he walked to the refrigerator to prepare himself something to eat.  He said he was on the ground but was aware of everything that was going on.  He is unable to state whether he called out for help.  He denies any complaints at this time.  I was unable to get in contact with patient's girlfriend who found him on the floor. ED course and data review: BP 151/121 with pulse of 56 and otherwise normal vitals.  Labs with normal CBC except for mild leukopenia of 3.1.  CMP significant for sodium of 131, potassium 3.3.  Troponin 29, CK2 86, urinalysis with moderate leukocyte esterase.  EtOH undetectable and UDS clean.EKG, personally viewed and entered creatinine, with undetermined rhythm, repeat EKG requested.  Imaging including chest x-ray CT head and C-spine with no acute abnormality. Hospitalist consulted for admission.    Review of Systems  All other systems reviewed and are negative.    Past Medical History:  Diagnosis Date    A-fib (HCC)    Anxiety    CHF (congestive heart failure) (HCC)    Depression    Hypersomnia with sleep apnea    Hypertension    Morbid obesity (HCC)    NSTEMI (non-ST elevated myocardial infarction) (HCC) 2005   Obesity hypoventilation syndrome (HCC)    Shortness of breath dyspnea    Sleep apnea    Stroke (HCC)    V-tach [facility])    Past Surgical History:  Procedure Laterality Date   LEFT HEART CATH AND CORONARY ANGIOGRAPHY Right 09/10/2018   Procedure: LEFT HEART CATH AND CORONARY ANGIOGRAPHY with possible percutaneous intervention;  Surgeon: Laurier Nancy, MD;  Location: [facility] INVASIVE CV LAB;  Service: Cardiovascular;  Laterality: Right;   TRACHEOSTOMY     Social History:  reports that he has been smoking cigarettes. He has a 7.00 pack-year smoking history. He has never used smokeless tobacco. He reports current alcohol use of about 4.0 standard drinks of alcohol per week. He reports current drug use. Drug: Marijuana.  No Known Allergies  Family History  Problem Relation Age of Onset   Heart disease Father    Diabetes type II Mother    Breast cancer Mother     Prior to Admission medications   Medication Sig Start Date End Date Taking? Authorizing Provider  amiodarone (PACERONE) 200 MG tablet Take 1 tablet (200 mg total) by mouth daily. 07/06/22 08/05/22  Delfino Lovett, MD  amLODipine (NORVASC) 5 MG tablet Take 1 tablet (5 mg total) by mouth daily. 07/06/22 08/05/22  Delfino Lovett, MD  apixaban (ELIQUIS) 5 MG TABS tablet Take 1 tablet (5 mg total) by mouth 2 (two) times daily. 07/06/22 08/05/22  Delfino Lovett, MD  buPROPion (WELLBUTRIN XL) 150 MG 24 hr tablet Take 1 tablet (150 mg total) by mouth daily. 07/06/22 08/05/22  Delfino Lovett, MD  chlorthalidone (HYGROTON) 25 MG tablet Take 1 tablet (25 mg total) by mouth daily. 07/06/22 08/05/22  Delfino Lovett, MD  furosemide (LASIX) 40 MG tablet Take 1 tablet (40 mg total) by mouth daily. 07/06/22 08/05/22  Delfino Lovett, MD  gabapentin (NEURONTIN) 300 MG capsule  Take 1 capsule (300 mg total) by mouth 3 (three) times daily. 07/06/22 08/05/22  Delfino Lovett, MD  hydrALAZINE (APRESOLINE) 100 MG tablet Take 1 tablet (100 mg total) by mouth 3 (three) times daily. 07/06/22 08/05/22  Delfino Lovett, MD  metoprolol tartrate (LOPRESSOR) 50 MG tablet Take 1 tablet (50 mg total) by mouth 2 (two) times daily. 07/06/22 08/05/22  Delfino Lovett, MD  QUEtiapine (SEROQUEL) 100 MG tablet Take 1 tablet (100 mg total) by mouth at bedtime. 07/06/22 08/05/22  Delfino Lovett, MD  rosuvastatin (CRESTOR) 20 MG tablet Take 1 tablet (20 mg total) by mouth at bedtime. 07/06/22 08/05/22  Delfino Lovett, MD  valsartan (DIOVAN) 320 MG tablet Take 1 tablet (320 mg total) by mouth every morning. 07/06/22 08/05/22  Delfino Lovett, MD  venlafaxine XR (EFFEXOR-XR) 75 MG 24 hr capsule Take 1 capsule (75 mg total) by mouth daily with breakfast. 07/06/22 08/05/22  Delfino Lovett, MD    Physical Exam: Vitals:   10/29/22 1424 10/29/22 1800 10/29/22 1843  BP: (!) 151/121 (!) 152/117   Pulse: (!) 56 (!) 55   Resp: 18 17   Temp: 97.7 F (36.5 C)  98 F (36.7 C)  TempSrc: Oral  Oral  SpO2: 97% 97%    Physical Exam Vitals and nursing note reviewed.  Constitutional:      General: He is not in acute distress. HENT:     Head: Normocephalic and atraumatic.  Cardiovascular:     Rate and Rhythm: Normal rate and regular rhythm.     Heart sounds: Normal heart sounds.  Pulmonary:     Effort: Pulmonary effort is normal.     Breath sounds: Normal breath sounds.  Abdominal:     Palpations: Abdomen is soft.     Tenderness: There is no abdominal tenderness.  Neurological:     Mental Status: Mental status is at baseline.     Comments: Chronic left-sided weakness     Labs on Admission: I have personally reviewed following labs and imaging studies  CBC: Recent Labs  Lab 10/29/22 1445  WBC 3.1*  NEUTROABS 1.9  HGB 14.7  HCT 41.5  MCV 94.1  PLT 223   Basic Metabolic Panel: Recent Labs  Lab 10/29/22 1445  NA 131*  K 3.3*  CL  90*  CO2 30  GLUCOSE 98  BUN 25*  CREATININE 1.21  CALCIUM 10.0   GFR: CrCl cannot be calculated (Unknown ideal weight.). Liver Function Tests: Recent Labs  Lab 10/29/22 1445  AST 37  ALT 22  ALKPHOS 71  BILITOT 1.4*  PROT 8.4*  ALBUMIN 4.0   No results for input(s): "LIPASE", "AMYLASE" in the last 168 hours. No results for input(s): "AMMONIA" in the last 168 hours. Coagulation Profile: Recent Labs  Lab 10/29/22 1445  INR 1.1   Cardiac Enzymes: Recent Labs  Lab 10/29/22 1445  CKTOTAL 286   BNP (last 3 results) No results  for input(s): "PROBNP" in the last 8760 hours. HbA1C: No results for input(s): "HGBA1C" in the last 72 hours. CBG: No results for input(s): "GLUCAP" in the last 168 hours. Lipid Profile: No results for input(s): "CHOL", "HDL", "LDLCALC", "TRIG", "CHOLHDL", "LDLDIRECT" in the last 72 hours. Thyroid Function Tests: No results for input(s): "TSH", "T4TOTAL", "FREET4", "T3FREE", "THYROIDAB" in the last 72 hours. Anemia Panel: No results for input(s): "VITAMINB12", "FOLATE", "FERRITIN", "TIBC", "IRON", "RETICCTPCT" in the last 72 hours. Urine analysis:    Component Value Date/Time   COLORURINE YELLOW (A) 10/29/2022 1429   APPEARANCEUR HAZY (A) 10/29/2022 1429   APPEARANCEUR Clear 12/11/2014 1147   LABSPEC 1.012 10/29/2022 1429   LABSPEC 1.020 12/11/2014 1147   PHURINE 7.0 10/29/2022 1429   GLUCOSEU NEGATIVE 10/29/2022 1429   GLUCOSEU Negative 12/11/2014 1147   HGBUR NEGATIVE 10/29/2022 1429   BILIRUBINUR NEGATIVE 10/29/2022 1429   BILIRUBINUR Negative 12/11/2014 1147   KETONESUR NEGATIVE 10/29/2022 1429   PROTEINUR >=300 (A) 10/29/2022 1429   NITRITE NEGATIVE 10/29/2022 1429   LEUKOCYTESUR MODERATE (A) 10/29/2022 1429   LEUKOCYTESUR Negative 12/11/2014 1147    Radiological Exams on Admission: CT Head Wo Contrast  Result Date: 10/29/2022 CLINICAL DATA:  Neuro deficit, acute, stroke suspected; Neck trauma, dangerous injury mechanism  (Age 78-64y) EXAM: CT HEAD WITHOUT CONTRAST CT CERVICAL SPINE WITHOUT CONTRAST TECHNIQUE: Multidetector CT imaging of the head and cervical spine was performed following the standard protocol without intravenous contrast. Multiplanar CT image reconstructions of the cervical spine were also generated. RADIATION DOSE REDUCTION: This exam was performed according to the departmental dose-optimization program which includes automated exposure control, adjustment of the mA and/or kV according to patient size and/or use of iterative reconstruction technique. COMPARISON:  CT head 04/20/2020, CT head 04/20/2019 FINDINGS: CT HEAD FINDINGS BRAIN: BRAIN Similar-appearing chronic right frontal and cerebellar encephalomalacia. Patchy and confluent areas of decreased attenuation are noted throughout the deep and periventricular white matter of the cerebral hemispheres bilaterally, compatible with chronic microvascular ischemic disease. No large acute territorial infarction. No parenchymal hemorrhage. No mass lesion. No extra-axial collection. No mass effect or midline shift. No hydrocephalus. Basilar cisterns are patent. Vascular: No hyperdense vessel. Skull: No acute fracture or focal lesion. Sinuses/Orbits: Paranasal sinuses and mastoid air cells are clear. The orbits are unremarkable. Other: None. CT CERVICAL SPINE FINDINGS Alignment: Reversal of normal cervical lordosis centered at the at C6 level likely due to positioning and degenerative changes. Grade 1 anterolisthesis of C3 on C4. Grade 1 anterolisthesis of C4 on C5. Skull base and vertebrae: Chronic appearing vertebral body height loss of the C5-C6 levels. Multilevel mild degenerative changes spine. No acute fracture. No aggressive appearing focal osseous lesion or focal pathologic process. Soft tissues and spinal canal: No prevertebral fluid or swelling. No visible canal hematoma. Upper chest: Unremarkable. Other: None. IMPRESSION: 1. No acute intracranial abnormality.  2. No acute displaced fracture or traumatic listhesis of the cervical spine. Electronically Signed   By: Tish Frederickson M.D.   On: 10/29/2022 17:45   CT Cervical Spine Wo Contrast  Result Date: 10/29/2022 CLINICAL DATA:  Neuro deficit, acute, stroke suspected; Neck trauma, dangerous injury mechanism (Age 68-64y) EXAM: CT HEAD WITHOUT CONTRAST CT CERVICAL SPINE WITHOUT CONTRAST TECHNIQUE: Multidetector CT imaging of the head and cervical spine was performed following the standard protocol without intravenous contrast. Multiplanar CT image reconstructions of the cervical spine were also generated. RADIATION DOSE REDUCTION: This exam was performed according to the departmental dose-optimization program which includes automated exposure control, adjustment  of the mA and/or kV according to patient size and/or use of iterative reconstruction technique. COMPARISON:  CT head 04/20/2020, CT head 04/20/2019 FINDINGS: CT HEAD FINDINGS BRAIN: BRAIN Similar-appearing chronic right frontal and cerebellar encephalomalacia. Patchy and confluent areas of decreased attenuation are noted throughout the deep and periventricular white matter of the cerebral hemispheres bilaterally, compatible with chronic microvascular ischemic disease. No large acute territorial infarction. No parenchymal hemorrhage. No mass lesion. No extra-axial collection. No mass effect or midline shift. No hydrocephalus. Basilar cisterns are patent. Vascular: No hyperdense vessel. Skull: No acute fracture or focal lesion. Sinuses/Orbits: Paranasal sinuses and mastoid air cells are clear. The orbits are unremarkable. Other: None. CT CERVICAL SPINE FINDINGS Alignment: Reversal of normal cervical lordosis centered at the at C6 level likely due to positioning and degenerative changes. Grade 1 anterolisthesis of C3 on C4. Grade 1 anterolisthesis of C4 on C5. Skull base and vertebrae: Chronic appearing vertebral body height loss of the C5-C6 levels. Multilevel  mild degenerative changes spine. No acute fracture. No aggressive appearing focal osseous lesion or focal pathologic process. Soft tissues and spinal canal: No prevertebral fluid or swelling. No visible canal hematoma. Upper chest: Unremarkable. Other: None. IMPRESSION: 1. No acute intracranial abnormality. 2. No acute displaced fracture or traumatic listhesis of the cervical spine. Electronically Signed   By: Tish FredericksonMorgane  Naveau M.D.   On: 10/29/2022 17:45   DG Chest 2 View  Result Date: 10/29/2022 CLINICAL DATA:  chest pain EXAM: CHEST - 2 VIEW COMPARISON:  Chest x-ray 01/01/2022 FINDINGS: Enlarged cardiac silhouette. The heart and mediastinal contours are unchanged. Elevated left hemidiaphragm. Left lower lobe streaky airspace opacity consistent with atelectasis. No focal consolidation. No pulmonary edema. No pleural effusion. No pneumothorax. No acute osseous abnormality. IMPRESSION: Stable cardiomegaly with no active cardiopulmonary disease. Electronically Signed   By: Tish FredericksonMorgane  Naveau M.D.   On: 10/29/2022 15:23     Data Reviewed: Relevant notes from primary care and specialist visits, past discharge summaries as available in EHR, including Care Everywhere. Prior diagnostic testing as pertinent to current admission diagnoses Updated medications and problem lists for reconciliation ED course, including vitals, labs, imaging, treatment and response to treatment Triage notes, nursing and pharmacy notes and ED provider's notes Notable results as noted in HPI   Assessment and Plan: * Acute metabolic encephalopathy Possible seizure/TIA. Etiology uncertain, differential will include seizure given urinary incontinence as a well as TIA, as well as as medication related as patient noted to be on multiple psychotropics and antihypertensives For possible seizure: Loaded with IV Keppra 500, seizure precautions, EEG, neurology consult For possible TIA: Neurologic checks, aspirin 325, continue Eliquis and  rosuvastatin, neurology consult, PT and OT consult Continuous cardiac monitoring, echocardiogram and MRI, carotid Doppler Neurologic checks and aspiration and fall precautions   Hemiparesis affecting left side as late effect of cerebrovascular accident (CVA) (HCC) Supportive care Continue Eliquis Holding antihypertensives to allow for permissive hypertension  Hypertensive urgency BP 152/117 Hold home meds to allow for permissive hypertension in case of TIA  Elevated troponin Chest pain Patient had chest pain on arrival mild elevation to 29.   Continue to trend troponin.  Initial EKG appears nonacute but repeating due to artifact ACS not suspected  Hyponatremia Appears chronic and at baseline.  Baseline 125-133 Possibly related to alcohol use   Paroxysmal atrial fibrillation (HCC) Continue amiodarone and Eliquis  Alcohol use disorder CIWA withdrawal protocol  Depression Continue fluoxetine 40 mg daily  Updated med rec showing patient longer on Wellbutrin or  venlafaxine  Chronic diastolic CHF (congestive heart failure) (HCC) Clinically euvolemic to dry Will hold furosemide and [place] on and metoprolol and valsartan to allow for permissive hypertension Daily weights  Obstructive sleep apnea syndrome Not currently on CPAP        DVT prophylaxis: Eliquis  Consults: none  Advance Care Planning:   Code Status: Full Code   Family Communication: none  Disposition Plan: Back to previous home environment  Severity of Illness: The appropriate patient status for this patient is OBSERVATION. Observation status is judged to be reasonable and necessary in order to provide the required intensity of service to ensure the patient's safety. The patient's presenting symptoms, physical exam findings, and initial radiographic and laboratory data in the context of their medical condition is felt to place them at decreased risk for further clinical deterioration. Furthermore, it  is anticipated that the patient will be medically stable for discharge from the hospital within 2 midnights of admission.   Author: Andris Baumann, MD 10/29/2022 9:44 PM  For on call review [email or web address].

## 2022-10-29 NOTE — Assessment & Plan Note (Signed)
Not currently on CPAP 

## 2022-10-30 ENCOUNTER — Observation Stay: Payer: Medicare HMO

## 2022-10-30 ENCOUNTER — Observation Stay (HOSPITAL_BASED_OUTPATIENT_CLINIC_OR_DEPARTMENT_OTHER)
Admit: 2022-10-30 | Discharge: 2022-10-30 | Disposition: A | Discharge status: Home / Self Care | Payer: Medicare HMO | Attending: Internal Medicine | Admitting: Internal Medicine

## 2022-10-30 ENCOUNTER — Encounter: Payer: Self-pay | Admitting: Internal Medicine

## 2022-10-30 DIAGNOSIS — Z01818 Encounter for other preprocedural examination: Secondary | ICD-10-CM | POA: Diagnosis not present

## 2022-10-30 DIAGNOSIS — R4182 Altered mental status, unspecified: Secondary | ICD-10-CM

## 2022-10-30 DIAGNOSIS — G459 Transient cerebral ischemic attack, unspecified: Secondary | ICD-10-CM | POA: Diagnosis not present

## 2022-10-30 DIAGNOSIS — R410 Disorientation, unspecified: Secondary | ICD-10-CM | POA: Diagnosis not present

## 2022-10-30 DIAGNOSIS — R569 Unspecified convulsions: Secondary | ICD-10-CM | POA: Diagnosis not present

## 2022-10-30 DIAGNOSIS — R519 Headache, unspecified: Secondary | ICD-10-CM | POA: Diagnosis not present

## 2022-10-30 DIAGNOSIS — R531 Weakness: Secondary | ICD-10-CM | POA: Diagnosis not present

## 2022-10-30 DIAGNOSIS — M1611 Unilateral primary osteoarthritis, right hip: Secondary | ICD-10-CM | POA: Diagnosis not present

## 2022-10-30 DIAGNOSIS — R29898 Other symptoms and signs involving the musculoskeletal system: Secondary | ICD-10-CM | POA: Diagnosis not present

## 2022-10-30 DIAGNOSIS — I160 Hypertensive urgency: Secondary | ICD-10-CM

## 2022-10-30 DIAGNOSIS — Z0389 Encounter for observation for other suspected diseases and conditions ruled out: Secondary | ICD-10-CM | POA: Diagnosis not present

## 2022-10-30 LAB — ECHOCARDIOGRAM COMPLETE
AR max vel: 6.87 cm2
AV Area VTI: 6.97 cm2
AV Area mean vel: 6.8 cm2
AV Mean grad: 1 mmHg
AV Peak grad: 2.4 mmHg
Ao pk vel: 0.77 m/s
Area-P 1/2: 5.02 cm2
Height: 71 in
S' Lateral: 2.3 cm
Weight: 3120 oz

## 2022-10-30 LAB — MAGNESIUM: Magnesium: 1.8 mg/dL (ref 1.7–2.4)

## 2022-10-30 LAB — BASIC METABOLIC PANEL
Anion gap: 13 (ref 5–15)
BUN: 26 mg/dL — ABNORMAL HIGH (ref 6–20)
CO2: 29 mmol/L (ref 22–32)
Calcium: 9.7 mg/dL (ref 8.9–10.3)
Chloride: 92 mmol/L — ABNORMAL LOW (ref 98–111)
Creatinine, Ser: 1.22 mg/dL (ref 0.61–1.24)
GFR, Estimated: >60 mL/min (ref >60)
Glucose, Bld: 91 mg/dL (ref 70–99)
Potassium: 2.9 mmol/L — ABNORMAL LOW (ref 3.5–5.1)
Sodium: 134 mmol/L — ABNORMAL LOW (ref 135–145)

## 2022-10-30 LAB — LIPID PANEL
Cholesterol: 174 mg/dL (ref 0–200)
HDL: 68 mg/dL (ref >40)
LDL Cholesterol: 95 mg/dL (ref 0–99)
Total CHOL/HDL Ratio: 2.6 RATIO
Triglycerides: 54 mg/dL (ref <150)
VLDL: 11 mg/dL (ref 0–40)

## 2022-10-30 LAB — URINE CULTURE: Culture: >=100000 — AB

## 2022-10-30 LAB — PHOSPHORUS: Phosphorus: 5.4 mg/dL — ABNORMAL HIGH (ref 2.5–4.6)

## 2022-10-30 LAB — POTASSIUM: Potassium: 3.5 mmol/L (ref 3.5–5.1)

## 2022-10-30 MED ORDER — POTASSIUM CHLORIDE CRYS ER 20 MEQ PO TBCR
40.0000 meq | EXTENDED_RELEASE_TABLET | ORAL | Status: AC
  Administered 2022-10-30 (×2): 40 meq via ORAL
  Filled 2022-10-30 (×2): qty 2

## 2022-10-30 MED ORDER — LORAZEPAM 2 MG/ML IJ SOLN
0.5000 mg | Freq: Once | INTRAMUSCULAR | Status: AC
  Administered 2022-10-30: 0.5 mg via INTRAVENOUS
  Filled 2022-10-30: qty 1

## 2022-10-30 MED ORDER — LEVETIRACETAM IN NACL 1000 MG/100ML IV SOLN
1000.0000 mg | Freq: Once | INTRAVENOUS | Status: AC
  Administered 2022-10-30: 1000 mg via INTRAVENOUS
  Filled 2022-10-30: qty 100

## 2022-10-30 MED ORDER — SODIUM CHLORIDE 0.9 % IV SOLN: 2000.0000 mg | INTRAVENOUS | Status: DC

## 2022-10-30 MED ORDER — LEVETIRACETAM 500 MG PO TABS: 500.0000 mg | ORAL_TABLET | Freq: Two times a day (BID) | ORAL | 0 refills | Status: DC

## 2022-10-30 MED ORDER — LEVETIRACETAM 500 MG PO TABS: 500.0000 mg | ORAL_TABLET | Freq: Two times a day (BID) | ORAL | Status: DC

## 2022-10-30 MED ORDER — NICOTINE 21 MG/24HR TD PT24: 21.0000 mg | MEDICATED_PATCH | Freq: Once | TRANSDERMAL | Status: DC

## 2022-10-30 MED ORDER — PERFLUTREN LIPID MICROSPHERE
1.0000 mL | INTRAVENOUS | Status: AC | PRN
  Administered 2022-10-30: 3 mL via INTRAVENOUS

## 2022-10-30 MED ORDER — LORAZEPAM 2 MG/ML IJ SOLN: 0.5000 mg | Freq: Once | INTRAMUSCULAR | Status: DC

## 2022-10-30 NOTE — Progress Notes (Incomplete)
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 Lost his balance and fell while cooking. He was aware of surrounding and did not pas out or become confused after fall. Last drink 3/7 ago. No personal or family h/o seizures. Walks with a walker at baseline I spoke to his girlfriend Jacobus. Concerned when she arrived from check she found patient on the floor.  Patient was aware of her sterile dressings.  Maxine Glenn also said that about 3 weeks prior to admission, he was sitting outside when patient suddenly fell backwards and this was associated jerky movement of his extremities.  Episode was brief so he did not seek medical intervention.Marland Kitchen

## 2022-10-30 NOTE — Progress Notes (Signed)
Eeg done 

## 2022-10-30 NOTE — Procedures (Signed)
Patient Name: Andrew Sparks  MRN: [id number]  Epilepsy Attending: Charlsie Quest  Referring Physician/Provider: Andris Baumann, MD  Date: 10/30/2022 Duration: 23.11 mins  Patient history: 56 year old male with altered mental status.  EEG to evaluate for seizure.  Level of alertness: Awake, asleep  AEDs during EEG study: None  Technical aspects: This EEG study was done with scalp electrodes positioned according to the 10-20 International system of electrode placement. Electrical activity was reviewed with band pass filter of 1-70Hz , sensitivity of 7 uV/mm, display speed of 87mm/sec with a 60Hz  notched filter applied as appropriate. EEG data were recorded continuously and digitally stored.  Video monitoring was available and reviewed as appropriate.  Description: The posterior dominant rhythm consists of 8 Hz activity of moderate voltage (25-35 uV) seen predominantly in posterior head regions, symmetric and reactive to eye opening and eye closing. Sleep was characterized by vertex waves, sleep spindles (12 to 14 Hz), maximal frontocentral region. EEG showed intermittent generalized 3 to 6 Hz theta-delta slowing. Hyperventilation and photic stimulation were not performed.     ABNORMALITY - Intermittent slow, generalized  IMPRESSION: This study is suggestive of mild diffuse encephalopathy, nonspecific etiology. No seizures or epileptiform discharges were seen throughout the recording.  Please note that lack of epileptiform activity during interictal EEG does not exclude the diagnosis of epilepsy.   Zanae Kuehnle 

## 2022-10-30 NOTE — Progress Notes (Signed)
Called by Dr. Myriam Forehand re: this patient. 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 I have reviewed his imaging in his chart with the hospitalist. He has multiple episodes concerning for seizures and has imaging that shows cortical damage in the right frontal lobe making him high risk for seizures. At this time, I would recommend we loaded him with Keppra 2 g IV x 1 and start him on Keppra 500 twice daily. He will need to maintain seizure precautions He will need outpatient follow-up with neurology-established patient of [facility], can follow there or at another practice of his choice. This was discussed with the hospitalist in person.  -- Milon Dikes, MD Neurologist [facility] Pager: [phone]  No charge note

## 2022-10-30 NOTE — ED Notes (Signed)
This RN spoke with patient and patient stated that he has oxygen tanks at his home.  NP sent E Message to RN stating that she will ensure that patient will receive home health.  NP gave order to discharge patient at this time.

## 2022-10-30 NOTE — Evaluation (Signed)
Occupational Therapy Evaluation Patient Details Name: Andrew Sparks MRN: [id number] DOB: [birth year] Today's Date: 10/30/2022   History of Present Illness Pt is a 56 y.o. male presenting to hospital 11/26 with AMS and episode of being found down; c/o mild chest pain and that it is 2009.  Pt admitted with acute metabolic encephalopathy, possible seizure/TIA, hypertensive urgency, elevated troponin (ACS not suspected), and hyponatremia.  PMH includes prior CVA with residual L sided deficits, htn, alcohol use disorder, paroxysmal a-fib, OSA not on home CPAP, CHF, h/o major depression with psychotic features admitted to [facility] psych unit 7/28, NSTEMI, tracheostomy.   Clinical Impression   [name] presents to OT with impairments in strength, balance, and cognition that impacts his engagement in self care tasks.  Prior to admission, patient lived with his girlfriend and was independent in most ADLs using cane.  He reports getting assistance from his girlfriend for lower body dressing and transportation.  Patient was extremely lethargic during session, but was participatory in questions for occupational profile.  He refused OOB mobility this date.  Per PT, patient was able to complete mobility with contact guard assist using rolling walker.  Anticipate patient requires set-min assist for seated UB ADLs and min-mod assist for lower body dressing and bathing.  Mr. Closs will continue to benefit from skilled OT services in acute setting to support functional strengthening, safety and independence in ADLs.  Recommend HHOT upon discharge if patient is able to receive similar level of support from his partner at home.        Recommendations for follow up therapy are one component of a multi-disciplinary discharge planning process, led by the attending physician.  Recommendations may be updated based on patient status, additional functional criteria and insurance authorization.   Follow Up Recommendations   Home health OT     Assistance Recommended at Discharge Intermittent Supervision/Assistance  Patient can return home with the following A little help with walking and/or transfers;A little help with bathing/dressing/bathroom;Assistance with cooking/housework;Direct supervision/assist for medications management;Direct supervision/assist for financial management;Assist for transportation    Functional Status Assessment  Patient has had a recent decline in their functional status and demonstrates the ability to make significant improvements in function in a reasonable and predictable amount of time.  Equipment Recommendations  BSC/3in1    Recommendations for Other Services       Precautions / Restrictions Precautions Precautions: Fall Precaution Comments: Seizure Restrictions Weight Bearing Restrictions: No      Mobility Bed Mobility Overal bed mobility: Needs Assistance               Patient Response: Flat affect  Transfers                          Balance Overall balance assessment: Needs assistance                                         ADL either performed or assessed with clinical judgement   ADL Overall ADL's : Needs assistance/impaired                                       General ADL Comments: Unable to assess 2/2 patient fatigue.  Per clinical reasoning and PT report, patient able to perform transfers and ambulation  with min guard assist and RW.  Per patient report, anticipate min assist required for UB ADLs 2/2 L side weakness, increased assist needed for lower body dressing/bathing.     Vision Patient Visual Report: No change from baseline       Perception     Praxis      Pertinent Vitals/Pain Pain Assessment Pain Assessment: No/denies pain     Hand Dominance Right   Extremity/Trunk Assessment Upper Extremity Assessment Upper Extremity Assessment: LUE deficits/detail LUE Deficits / Details:  Residual weakness 2/2 prior CVA.  Fair L hand grip strength; at least 3/5 AROM elbow flexion/extension   Lower Extremity Assessment Lower Extremity Assessment: Defer to PT evaluation RLE Deficits / Details: at least 4/5 hip flexion, knee flexion/extension, and DF/PF LLE Deficits / Details: at least 3/5 hip flexion and knee flexion/extension; at least 2+/5 DF   Cervical / Trunk Assessment Cervical / Trunk Assessment: Other exceptions Cervical / Trunk Exceptions: forward head/shoulders   Communication Communication Communication: Other (comment) (communication limited 2/2 patient fatigue)   Cognition Arousal/Alertness: Lethargic Behavior During Therapy: Flat affect Overall Cognitive Status: Impaired/Different from baseline Area of Impairment: Orientation, Memory, Following commands                 Orientation Level: Disoriented to, Time (oriented to day of week, disoriented to date and month (states it is December))   Memory: Decreased short-term memory, Decreased recall of precautions Following Commands: Follows one step commands inconsistently     Problem Solving: Slow processing General Comments: Difficult to assess 2/2 fatigue.     General Comments       Exercises Other Exercises Other Exercises: provided education re: OT role and plan of care, fall and safety precautions   Shoulder Instructions      Home Living Family/patient expects to be discharged to:: Private residence Living Arrangements: Other relatives (girlfriend) Available Help at Discharge: Friend(s);Available PRN/intermittently Type of Home: Apartment Home Access: Stairs to enter Entrance Stairs-Number of Steps: 1 Entrance Stairs-Rails: None Home Layout: One level     Bathroom Shower/Tub: Chief Strategy Officer: Handicapped height     Home Equipment: Production assistant, radio - single point          Prior Functioning/Environment Prior Level of Function : Needs assist              Mobility Comments: Ambulatory with cane.  1-2 falls (d/t tripping) in past 6 months. ADLs Comments: Assist for driving and lower body dressing.        OT Problem List: Decreased strength;Decreased activity tolerance;Impaired balance (sitting and/or standing);Decreased cognition;Decreased safety awareness;Decreased knowledge of use of DME or AE;Decreased knowledge of precautions;Impaired UE functional use      OT Treatment/Interventions: Self-care/ADL training;Therapeutic exercise;Neuromuscular education;DME and/or AE instruction;Therapeutic activities;Cognitive remediation/compensation;Patient/family education;Balance training    OT Goals(Current goals can be found in the care plan section) Acute Rehab OT Goals Patient Stated Goal: to go home OT Goal Formulation: With patient Time For Goal Achievement: 11/13/22 Potential to Achieve Goals: Good  OT Frequency: Min 2X/week    Co-evaluation              AM-PAC OT "6 Clicks" Daily Activity     Outcome Measure Help from another person eating meals?: None Help from another person taking care of personal grooming?: A Little Help from another person toileting, which includes using toliet, bedpan, or urinal?: A Little Help from another person bathing (including washing, rinsing, drying)?: A Little Help from another person to  put on and taking off regular upper body clothing?: None Help from another person to put on and taking off regular lower body clothing?: A Lot 6 Click Score: 19   End of Session    Activity Tolerance: Patient limited by fatigue Patient left: in bed;with call bell/phone within reach;Other (comment) (other provider in room)  OT Visit Diagnosis: Unsteadiness on feet (R26.81);Muscle weakness (generalized) (M62.81);Other symptoms and signs involving cognitive function                Time: [id number] OT Time Calculation (min): 9 min Charges:  OT General Charges $OT Visit: 1 Visit OT Evaluation $OT Eval Moderate  Complexity: 1 Mod  Quantisha Marsicano, OTR/L 10/30/22, 12:09 PM

## 2022-10-30 NOTE — Progress Notes (Signed)
       CROSS COVER NOTE  NAME: Ahamed Hofland MRN: [id number] DOB : 01/27/1966 ATTENDING PHYSICIAN: Lurene Shadow, MD    Date of Service   10/30/2022   HPI/Events of Note   Medication request received for Nicotine patch.  Interventions   Assessment/Plan:  Nicotine patch     This document was prepared using Dragon voice recognition software and may include unintentional dictation errors.  Bishop Limbo DNP, MBA, FNP-BC Nurse Practitioner [facility] Pager [phone]

## 2022-10-30 NOTE — Progress Notes (Signed)
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 He was brought into the ED w/ presentation of "multiple episodes concerning for seizures and has imaging that shows cortical damage in the right frontal lobe making him high risk for seizures.". He has been given "Keppra 2 g IV x 1 and start him on Keppra 500 twice daily." W/ seizure precautions.  ST services will f/u tomorrow w/ pt's cognitive-communication status and needs.       Jerilynn Som, MS, CCC-SLP Speech Language Pathologist Rehab Services; [facility] [phone] (ascom) Kelena Garrow 10/30/2022, 2:24 PM

## 2022-10-30 NOTE — Discharge Summary (Addendum)
Physician Discharge Summary   Patient: Andrew Sparks MRN: [id number] DOB: 04/28/1966  Admit date:     10/29/2022  Discharge date: 10/30/22  Discharge Physician: Jennye Boroughs   PCP: Jodi Marble, MD   Recommendations at discharge:   Follow-up with PCP in 1 week Follow-up with Dr. Kathleen Argue neurologist in 1 month  Discharge Diagnoses: Principal Problem:   Seizure [place] [facility]) Active Problems:   Hypertensive urgency   Hemiparesis affecting left side as late effect of cerebrovascular accident (CVA) ([place])   Elevated troponin   Hyponatremia   Paroxysmal atrial fibrillation (HCC)   Chest pain   Obstructive sleep apnea syndrome   Chronic diastolic CHF (congestive heart failure) ([place])   Depression   Alcohol use disorder  Resolved Problems:   * No resolved hospital problems. [facility] Course:  Andrew Sparks is a 56 y.o. male with medical history significant for paroxysmal atrial fibrillation, on Xarelto, essential hypertension, coronary artery disease, obstructive sleep apnea not on home CPAP, chronic hypoxic respiratory failure on 3 L/min oxygen via [place], chronic diastolic CHF, CVA, hyponatremia, history of major depression with psychotic features admitted to [facility] psych unit on 06/30/2022 , alcohol use disorder, who was brought to the hospital because of chest pain, left-sided weakness and confusion after he was found down on the floor by his girlfriend.  I personally obtained history from [place], his girlfriend.  [name] said that she came home from church and found the patient on the floor.  The patient was unable to get up from the floor by himself.  Reportedly, patient was oriented and aware of his surroundings by the time the [place] arrived on the scene.  EMS was called and he was brought to the hospital for further evaluation.  [name] says that about 3 weeks prior to admission, patient had a seizure-like episode however stable sitting at a park.  He said patient  suddenly fell backwards while sitting and this was associated with jerking movement of his extremities.  This episode was short-lived so he did not seek medical attention.  Patient was admitted to the hospital for change in mental status with differential diagnosis of TIA and seizures.  TIA was ruled out and seizures was felt to be more likely.  He was treated with IV Keppra.  Case was discussed with Dr. Rory Percy, neurologist on-call.  He recommended that patient be discharged on Keppra 500 mg twice daily.  They have been encouraged to obtain Keppra refills before running out of Keppra completely.  He also had hypertensive urgency which has improved with antihypertensives.  He had hypokalemia that was repleted.  He was evaluated by PT and OT who recommended home health therapy.  His condition has improved and he is deemed stable for discharge to home today.  He has chronic hypoxic respiratory failure and he is supposed to use 3 L/min oxygen via nasal cannula around-the-clock.  However, [name] said that patient does not use it all the time.  Patient and [name] have been advised to use his oxygen at all times.          Consultants: Neurologist Procedures performed: None Disposition: Home health Diet recommendation:  Cardiac diet DISCHARGE MEDICATION: Allergies as of 10/30/2022   No Known Allergies      Medication List     TAKE these medications    albuterol 108 (90 Base) MCG/ACT inhaler Commonly known as: VENTOLIN HFA Inhale 1-2 puffs into the lungs as directed.   amiodarone 200 MG tablet Commonly  known as: PACERONE Take 1 tablet (200 mg total) by mouth daily.   amLODipine 5 MG tablet Commonly known as: NORVASC Take 1 tablet (5 mg total) by mouth daily.   apixaban 5 MG Tabs tablet Commonly known as: ELIQUIS Take 1 tablet (5 mg total) by mouth 2 (two) times daily.   buPROPion 150 MG 24 hr tablet Commonly known as: WELLBUTRIN XL Take 1 tablet (150 mg total) by mouth daily.    chlorthalidone 25 MG tablet Commonly known as: HYGROTON Take 1 tablet (25 mg total) by mouth daily.   furosemide 40 MG tablet Commonly known as: LASIX Take 1 tablet (40 mg total) by mouth daily.   gabapentin 400 MG capsule Commonly known as: NEURONTIN Take 400 mg by mouth 3 (three) times daily. What changed: Another medication with the same name was removed. Continue taking this medication, and follow the directions you see here.   hydrALAZINE 100 MG tablet Commonly known as: APRESOLINE Take 1 tablet (100 mg total) by mouth 3 (three) times daily.   levETIRAcetam 500 MG tablet Commonly known as: KEPPRA Take 1 tablet (500 mg total) by mouth 2 (two) times daily.   lisinopril 40 MG tablet Commonly known as: ZESTRIL Take 40 mg by mouth daily.   metoprolol tartrate 50 MG tablet Commonly known as: LOPRESSOR Take 1 tablet (50 mg total) by mouth 2 (two) times daily.   QUEtiapine 100 MG tablet Commonly known as: SEROQUEL Take 1 tablet (100 mg total) by mouth at bedtime.   rosuvastatin 20 MG tablet Commonly known as: CRESTOR Take 1 tablet (20 mg total) by mouth at bedtime.   valsartan 320 MG tablet Commonly known as: DIOVAN Take 1 tablet (320 mg total) by mouth every morning.   venlafaxine XR 75 MG 24 hr capsule Commonly known as: EFFEXOR-XR Take 1 capsule (75 mg total) by mouth daily with breakfast.               Durable Medical Equipment  (From admission, onward)           Start     Ordered   10/30/22 0000  For home use only DME oxygen       Question Answer Comment  Length of Need Lifetime   Mode or (Route) Nasal cannula   Liters per Minute 3   Frequency Continuous (stationary and portable oxygen unit needed)   Oxygen conserving device Yes   Oxygen delivery system Gas      10/30/22 1700   10/30/22 0000  For home use only DME 3 n 1        10/30/22 1700            Follow-up Information     [facility]. Schedule an  appointment as soon as possible for a visit in 1 month(s).   Why: seizure Contact information: [place] [phone]               Discharge Exam: Danley Danker Weights   10/29/22 2258  Weight: 88.5 kg   GEN: NAD SKIN: Warm and dry EYES: No pallor or icterus ENT: MMM CV: RRR PULM: CTA B ABD: soft, ND, NT, +BS CNS: AAO x 3, non focal EXT: No edema or tenderness   Condition at discharge: good  The results of significant diagnostics from this hospitalization (including imaging, microbiology, ancillary and laboratory) are listed below for reference.   Imaging Studies: ECHOCARDIOGRAM COMPLETE  Result Date: 10/30/2022  ECHOCARDIOGRAM REPORT   Patient Name:   Andrew Sparks Date of Exam: 10/30/2022 Medical Rec #:  [id number]              Height:       71.0 in Accession #:    [id number]             Weight:       195.0 lb Date of Birth:  03-12-1966              BSA:          2.086 m Patient Age:    30 years               BP:           154/121 mmHg Patient Gender: M                      HR:           58 bpm. Exam Location:  [facility] Procedure: 2D Echo, Color Doppler, Cardiac Doppler and Intracardiac            Opacification Agent Indications:     G45.9 TIA  History:         Patient has prior history of Echocardiogram examinations, most                  recent 04/20/2020. CHF; Risk Factors:Sleep Apnea and                  Hypertension.  Sonographer:     Charmayne Sheer Referring Phys:  [id number] Athena Masse Diagnosing Phys: Kathlyn Sacramento MD  Sonographer Comments: Technically difficult study due to poor echo windows. IMPRESSIONS  1. Left ventricular ejection fraction, by estimation, is 55 to 60%. The left ventricle has normal function. Left ventricular endocardial border not optimally defined to evaluate regional wall motion. There is mild left ventricular hypertrophy. Left ventricular diastolic parameters are consistent with Grade I diastolic  dysfunction (impaired relaxation).  2. Right ventricular systolic function is normal. The right ventricular size is normal. Tricuspid regurgitation signal is inadequate for assessing PA pressure.  3. Left atrial size was mildly dilated.  4. The mitral valve is normal in structure. No evidence of mitral valve regurgitation. No evidence of mitral stenosis.  5. The aortic valve was not well visualized. Aortic valve regurgitation is mild. No aortic stenosis is present.  6. Aortic dilatation noted. 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 This was not well visualized. recommend CTA for better evaluation of this.  7. The inferior vena cava is dilated in size with >50% respiratory variability, suggesting right atrial pressure of 8 mmHg. FINDINGS  Left Ventricle: Left ventricular ejection fraction, by estimation, is 55 to 60%. The left ventricle has normal function. Left ventricular endocardial border not optimally defined to evaluate regional wall motion. Definity contrast agent was given IV to delineate the left ventricular endocardial borders. The left ventricular internal cavity size was normal in size. There is mild left ventricular hypertrophy. Left ventricular diastolic parameters are consistent with Grade I diastolic dysfunction (impaired relaxation). Right Ventricle: The right ventricular size is normal. No increase in right ventricular wall thickness. Right ventricular systolic function is normal. Tricuspid regurgitation signal is inadequate for assessing PA pressure. Left Atrium: Left atrial size was mildly dilated. Right Atrium: Right atrial size was normal in size. Pericardium: There is no evidence of pericardial effusion. Mitral Valve:  The mitral valve is normal in structure. No evidence of mitral valve regurgitation. No evidence of mitral valve stenosis. Tricuspid Valve: The tricuspid valve is normal in structure. Tricuspid valve regurgitation is not  demonstrated. No evidence of tricuspid stenosis. Aortic Valve: The aortic valve was not well visualized. Aortic valve regurgitation is mild. No aortic stenosis is present. Aortic valve mean gradient measures 1.0 mmHg. Aortic valve peak gradient measures 2.4 mmHg. Aortic valve area, by VTI measures 6.97  cm. Pulmonic Valve: The pulmonic valve was normal in structure. Pulmonic valve regurgitation is not visualized. No evidence of pulmonic stenosis. Aorta: The aortic root is normal in size and structure and aortic dilatation noted. 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 Venous: The inferior vena cava is dilated in size with greater than 50% respiratory variability, suggesting right atrial pressure of 8 mmHg. IAS/Shunts: No atrial level shunt detected by color flow Doppler.  LEFT VENTRICLE PLAX 2D LVIDd:         4.10 cm   Diastology LVIDs:         2.30 cm   LV e' medial:    6.96 cm/s LV PW:         1.30 cm   LV E/e' medial:  9.0 LV IVS:        1.00 cm   LV e' lateral:   9.90 cm/s LVOT diam:     3.00 cm   LV E/e' lateral: 6.4 LV SV:         96 LV SV Index:   46 LVOT Area:     7.07 cm  RIGHT VENTRICLE RV Basal diam:  4.00 cm RV S prime:     10.60 cm/s LEFT ATRIUM           Index LA diam:      3.90 cm 1.87 cm/m LA Vol (A4C): 53.6 ml 25.70 ml/m  AORTIC VALVE                    PULMONIC VALVE AV Area (Vmax):    6.87 cm     PV Vmax:       0.79 m/s AV Area (Vmean):   6.80 cm     PV Vmean:      55.100 cm/s AV Area (VTI):     6.97 cm     PV VTI:        0.191 m AV Vmax:           77.20 cm/s   PV Peak grad:  2.5 mmHg AV Vmean:          51.800 cm/s  PV Mean grad:  1.0 mmHg AV VTI:            0.138 m AV Peak Grad:      2.4 mmHg AV Mean Grad:      1.0 mmHg LVOT Vmax:         75.00 cm/s LVOT Vmean:        49.800 cm/s LVOT VTI:          0.136 m LVOT/AV VTI ratio: 0.99  AORTA Ao Root diam: 5.30 cm MITRAL VALVE MV Area (PHT): 5.02 cm     SHUNTS MV Decel Time: 151 msec      Systemic VTI:  0.14 m MV E velocity: 62.90 cm/s   Systemic Diam: 3.00 cm MV A velocity: 104.00 cm/s MV E/A ratio:  0.60 Lorine Bears MD Electronically signed by Jerolyn Center  Arida MD Signature Date/Time: 10/30/2022/3:08:13 PM    Final    MR BRAIN WO CONTRAST  Result Date: 10/30/2022 CLINICAL DATA:  Chest pain and left-sided weakness; confusion EXAM: MRI HEAD WITHOUT CONTRAST TECHNIQUE: Multiplanar, multiecho pulse sequences of the brain and surrounding structures were obtained without intravenous contrast. COMPARISON:  04/21/2019 MRI, correlation is also made with CT head 10/29/2022 FINDINGS: Brain: No restricted diffusion to suggest acute or subacute infarct. No acute hemorrhage, mass, mass effect, or midline shift. No hydrocephalus or extra-axial collection. Encephalomalacia in the right frontal lobe and right cerebellum, sequela of remote infarcts. Hemosiderin deposition is associated with the right frontal infarct. Confluent T2 hyperintense signal in the periventricular white matter, likely the sequela of moderate to severe chronic small vessel ischemic disease. Vascular: Normal arterial flow voids. Skull and upper cervical spine: Normal marrow signal. Sinuses/Orbits: Mucosal thickening in the left maxillary sinus. The orbits are unremarkable. Other: The mastoids are well aerated. IMPRESSION: No acute intracranial process. No evidence of acute or subacute infarct. Electronically Signed   By: Wiliam Ke M.D.   On: 10/30/2022 01:19   DG Abd 1 View  Result Date: 10/30/2022 CLINICAL DATA:  Encounter for imaging to screen for metal prior to MRI EXAM: ABDOMEN - 1 VIEW COMPARISON:  CT abdomen and pelvis 03/26/2018 FINDINGS: The bowel gas pattern is normal. No radio-opaque calculi or other significant radiographic abnormality are seen. No metallic foreign object in the visualized abdomen or pelvis. IMPRESSION: Negative MRI prescreening radiographs of the abdomen and pelvis. No metallic foreign object.  Electronically Signed   By: Minerva Fester M.D.   On: 10/30/2022 00:57   DG FEMUR 1V RIGHT  Result Date: 10/30/2022 CLINICAL DATA:  Possible retained foreign body EXAM: RIGHT FEMUR 1 VIEW COMPARISON:  None Available. FINDINGS: Degenerative changes of the right hip joint are noted. No acute fracture or dislocation is seen. EKG lead is noted on the medial aspect of the right thigh. No radiopaque foreign body is noted. IMPRESSION: No radiopaque foreign body noted. Electronically Signed   By: Alcide Clever M.D.   On: 10/30/2022 00:55   US Carotid Bilateral (at [facility] and [facility] only)  Result Date: 10/29/2022 CLINICAL DATA:  TIA EXAM: BILATERAL CAROTID DUPLEX ULTRASOUND TECHNIQUE: Wallace Cullens scale imaging, color Doppler and duplex ultrasound were performed of bilateral carotid and vertebral arteries in the neck. COMPARISON:  CTA neck 04/21/2019 FINDINGS: Criteria: Quantification of carotid stenosis is based on velocity parameters that correlate the residual internal carotid diameter with NASCET-based stenosis levels, using the diameter of the distal internal carotid lumen as the denominator for stenosis measurement. The following velocity measurements were obtained: RIGHT ICA: 44 cm/sec CCA: 35 cm/sec SYSTOLIC ICA/CCA RATIO:  1.3 ECA: 27 cm/sec LEFT ICA: 47 cm/sec CCA: 58 cm/sec SYSTOLIC ICA/CCA RATIO:  0.8 ECA: 18 cm/sec RIGHT CAROTID ARTERY: Predominantly calcified athero sclerotic plaque at the carotid bulb. RIGHT VERTEBRAL ARTERY:  Antegrade flow. LEFT CAROTID ARTERY: Predominantly calcified athero sclerotic plaque at the carotid bulb. LEFT VERTEBRAL ARTERY:  Antegrade flow. IMPRESSION: No hemodynamically significant stenosis. Electronically Signed   By: Minerva Fester M.D.   On: 10/29/2022 22:47   CT Head Wo Contrast  Result Date: 10/29/2022 CLINICAL DATA:  Neuro deficit, acute, stroke suspected; Neck trauma, dangerous injury mechanism (Age 33-64y) EXAM: CT HEAD WITHOUT CONTRAST CT CERVICAL SPINE WITHOUT  CONTRAST TECHNIQUE: Multidetector CT imaging of the head and cervical spine was performed following the standard protocol without intravenous contrast. Multiplanar CT image reconstructions of the cervical spine were  also generated. RADIATION DOSE REDUCTION: This exam was performed according to the departmental dose-optimization program which includes automated exposure control, adjustment of the mA and/or kV according to patient size and/or use of iterative reconstruction technique. COMPARISON:  CT head 04/20/2020, CT head 04/20/2019 FINDINGS: CT HEAD FINDINGS BRAIN: BRAIN Similar-appearing chronic right frontal and cerebellar encephalomalacia. Patchy and confluent areas of decreased attenuation are noted throughout the deep and periventricular white matter of the cerebral hemispheres bilaterally, compatible with chronic microvascular ischemic disease. No large acute territorial infarction. No parenchymal hemorrhage. No mass lesion. No extra-axial collection. No mass effect or midline shift. No hydrocephalus. Basilar cisterns are patent. Vascular: No hyperdense vessel. Skull: No acute fracture or focal lesion. Sinuses/Orbits: Paranasal sinuses and mastoid air cells are clear. The orbits are unremarkable. Other: None. CT CERVICAL SPINE FINDINGS Alignment: Reversal of normal cervical lordosis centered at the at C6 level likely due to positioning and degenerative changes. Grade 1 anterolisthesis of C3 on C4. Grade 1 anterolisthesis of C4 on C5. Skull base and vertebrae: Chronic appearing vertebral body height loss of the C5-C6 levels. Multilevel mild degenerative changes spine. No acute fracture. No aggressive appearing focal osseous lesion or focal pathologic process. Soft tissues and spinal canal: No prevertebral fluid or swelling. No visible canal hematoma. Upper chest: Unremarkable. Other: None. IMPRESSION: 1. No acute intracranial abnormality. 2. No acute displaced fracture or traumatic listhesis of the cervical  spine. Electronically Signed   By: Iven Finn M.D.   On: 10/29/2022 17:45   CT Cervical Spine Wo Contrast  Result Date: 10/29/2022 CLINICAL DATA:  Neuro deficit, acute, stroke suspected; Neck trauma, dangerous injury mechanism (Age 18-64y) EXAM: CT HEAD WITHOUT CONTRAST CT CERVICAL SPINE WITHOUT CONTRAST TECHNIQUE: Multidetector CT imaging of the head and cervical spine was performed following the standard protocol without intravenous contrast. Multiplanar CT image reconstructions of the cervical spine were also generated. RADIATION DOSE REDUCTION: This exam was performed according to the departmental dose-optimization program which includes automated exposure control, adjustment of the mA and/or kV according to patient size and/or use of iterative reconstruction technique. COMPARISON:  CT head 04/20/2020, CT head 04/20/2019 FINDINGS: CT HEAD FINDINGS BRAIN: BRAIN Similar-appearing chronic right frontal and cerebellar encephalomalacia. Patchy and confluent areas of decreased attenuation are noted throughout the deep and periventricular white matter of the cerebral hemispheres bilaterally, compatible with chronic microvascular ischemic disease. No large acute territorial infarction. No parenchymal hemorrhage. No mass lesion. No extra-axial collection. No mass effect or midline shift. No hydrocephalus. Basilar cisterns are patent. Vascular: No hyperdense vessel. Skull: No acute fracture or focal lesion. Sinuses/Orbits: Paranasal sinuses and mastoid air cells are clear. The orbits are unremarkable. Other: None. CT CERVICAL SPINE FINDINGS Alignment: Reversal of normal cervical lordosis centered at the at C6 level likely due to positioning and degenerative changes. Grade 1 anterolisthesis of C3 on C4. Grade 1 anterolisthesis of C4 on C5. Skull base and vertebrae: Chronic appearing vertebral body height loss of the C5-C6 levels. Multilevel mild degenerative changes spine. No acute fracture. No aggressive  appearing focal osseous lesion or focal pathologic process. Soft tissues and spinal canal: No prevertebral fluid or swelling. No visible canal hematoma. Upper chest: Unremarkable. Other: None. IMPRESSION: 1. No acute intracranial abnormality. 2. No acute displaced fracture or traumatic listhesis of the cervical spine. Electronically Signed   By: Iven Finn M.D.   On: 10/29/2022 17:45   DG Chest 2 View  Result Date: 10/29/2022 CLINICAL DATA:  chest pain EXAM: CHEST - 2 VIEW COMPARISON:  Chest x-ray 01/01/2022 FINDINGS: Enlarged cardiac silhouette. The heart and mediastinal contours are unchanged. Elevated left hemidiaphragm. Left lower lobe streaky airspace opacity consistent with atelectasis. No focal consolidation. No pulmonary edema. No pleural effusion. No pneumothorax. No acute osseous abnormality. IMPRESSION: Stable cardiomegaly with no active cardiopulmonary disease. Electronically Signed   By: Iven Finn M.D.   On: 10/29/2022 15:23    Microbiology: Results for orders placed or performed during the hospital encounter of 10/29/22  Urine Culture     Status: Abnormal   Collection Time: 10/29/22  2:45 PM   Specimen: Urine, Clean Catch  Result Value Ref Range Status   Specimen Description   Final    URINE, CLEAN CATCH Performed at [facility], [address], [place], [place] [address]    Special Requests   Final    NONE Performed at [facility], [address] [place], [place] [address]    Culture (A)  Final    >=100,000 COLONIES/mL GROUP B STREP(S.AGALACTIAE)ISOLATED TESTING AGAINST S. AGALACTIAE NOT ROUTINELY PERFORMED DUE TO PREDICTABILITY OF AMP/PEN/VAN SUSCEPTIBILITY. Performed at [facility], [place] [address] [place], [place] [address]    Report Status 10/30/2022 FINAL  Final    Labs: CBC: Recent Labs  Lab 10/29/22 1445  WBC 3.1*  NEUTROABS 1.9  HGB 14.7  HCT 41.5  MCV 94.1  PLT [id number]   Basic Metabolic Panel: Recent Labs  Lab  10/29/22 1445 10/30/22 0648 10/30/22 1808  NA 131* 134*  --   K 3.3* 2.9* 3.5  CL 90* 92*  --   CO2 30 29  --   GLUCOSE 98 91  --   BUN 25* 26*  --   CREATININE 1.21 1.22  --   CALCIUM 10.0 9.7  --   MG  --  1.8  --   PHOS  --  5.4*  --    Liver Function Tests: Recent Labs  Lab 10/29/22 1445  AST 37  ALT 22  ALKPHOS 71  BILITOT 1.4*  PROT 8.4*  ALBUMIN 4.0   CBG: No results for input(s): "GLUCAP" in the last 168 hours.  Discharge time spent: greater than 30 minutes.  Signed: Jennye Boroughs, MD [facility] 10/30/2022

## 2022-10-30 NOTE — Evaluation (Signed)
Physical Therapy Evaluation Patient Details Name: Andrew Sparks MRN: [id number] DOB: 1966/04/29 Today's Date: 10/30/2022  History of Present Illness  Pt is a 56 y.o. male presenting to hospital 11/26 with AMS and episode of being found down; c/o mild chest pain and that it is 2009.  Pt admitted with acute metabolic encephalopathy, possible seizure/TIA, hypertensive urgency, elevated troponin (ACS not suspected), and hyponatremia.  PMH includes prior CVA with residual L sided deficits, htn, alcohol use disorder, paroxysmal a-fib, OSA not on home CPAP, CHF, h/o major depression with psychotic features admitted to [facility] psych unit 7/28, NSTEMI, tracheostomy.  Clinical Impression  Prior to hospital admission, pt reports being ambulatory with SW; lives with his girlfriend in 1 level apt with 1 STE; assist with driving and lower body dressing.  No c/o pain during session.  Currently pt is SBA to min assist with bed mobility; CGA with transfers; and CGA to ambulate 15 feet and then 20 feet with RW use (pt with gait impairments but pt reporting baseline and that he normally takes small steps).  During ambulation pt's O2 sats reading 76% on room air walking back from toileting (unsure if accurate reading) but O2 sats 96% on room air shortly after sitting down (nurse notified).  Of note, pt reports using 3 L home O2 chronic (at night and during the day but does not use it when he is walking around) but currently on room air in ED (nurse notified).  Pt would benefit from skilled PT to address noted impairments and functional limitations (see below for any additional details).  Upon hospital discharge, pt would benefit from HHPT.    Recommendations for follow up therapy are one component of a multi-disciplinary discharge planning process, led by the attending physician.  Recommendations may be updated based on patient status, additional functional criteria and insurance authorization.  Follow Up  Recommendations Home health PT      Assistance Recommended at Discharge Intermittent Supervision/Assistance  Patient can return home with the following  A little help with walking and/or transfers;A little help with bathing/dressing/bathroom;Assistance with cooking/housework;Assist for transportation;Help with stairs or ramp for entrance    Equipment Recommendations Rolling walker (2 wheels);BSC/3in1  Recommendations for Other Services  OT consult    Functional Status Assessment Patient has had a recent decline in their functional status and demonstrates the ability to make significant improvements in function in a reasonable and predictable amount of time.     Precautions / Restrictions Precautions Precautions: Fall Precaution Comments: Seizure Restrictions Weight Bearing Restrictions: No      Mobility  Bed Mobility Overal bed mobility: Needs Assistance Bed Mobility: Supine to Sit, Sit to Supine     Supine to sit: Min assist (assist for trunk) Sit to supine: Supervision (increased effort to perform on own)        Transfers Overall transfer level: Needs assistance Equipment used: Rolling walker (2 wheels) Transfers: Sit to/from Stand Sit to Stand: Min guard           General transfer comment: x3 trials from stretcher bed in ED using RW and x1 trial from toilet using grab bar with R UE; vc's for UE placement    Ambulation/Gait Ambulation/Gait assistance: Min guard Gait Distance (Feet):  (15 feet; 20 feet) Assistive device: Rolling walker (2 wheels)   Gait velocity: decreased     General Gait Details: decreased B LE step length/foot clearance/heelstrike; mild decreased stance time L LE  Stairs  Wheelchair Mobility    Modified Rankin (Stroke Patients Only)       Balance Overall balance assessment: Needs assistance Sitting-balance support: No upper extremity supported, Feet supported Sitting balance-Leahy Scale: Good Sitting balance -  Comments: steady sitting reaching within BOS   Standing balance support: Bilateral upper extremity supported, During functional activity, Reliant on assistive device for balance Standing balance-Leahy Scale: Good Standing balance comment: steady ambulating with RW use                             Pertinent Vitals/Pain Pain Assessment Pain Assessment: No/denies pain HR WFL during sessions activities.    Home Living Family/patient expects to be discharged to:: Private residence Living Arrangements: Other relatives (girlfriend Maxine Glenn)) Available Help at Discharge: Friend(s);Available PRN/intermittently Type of Home: Apartment Home Access: Stairs to enter Entrance Stairs-Rails: None Entrance Stairs-Number of Steps: 1   Home Layout: One level Home Equipment: Firefighter;Shower seat      Prior Function Prior Level of Function : Needs assist (pt reports wearing 3 L O2 chronic but doesn't use it when walking around)             Mobility Comments: Ambulatory with SW.  1-2 falls (d/t tripping) in past 6 months. ADLs Comments: Assist for driving and lower body dressing.     Hand Dominance        Extremity/Trunk Assessment   Upper Extremity Assessment Upper Extremity Assessment:  (fair L hand grip strength; at least 3/5 AROM elbow flexion/extension)    Lower Extremity Assessment Lower Extremity Assessment: RLE deficits/detail;LLE deficits/detail RLE Deficits / Details: at least 4/5 hip flexion, knee flexion/extension, and DF/PF LLE Deficits / Details: at least 3/5 hip flexion and knee flexion/extension; at least 2+/5 DF    Cervical / Trunk Assessment Cervical / Trunk Assessment: Other exceptions Cervical / Trunk Exceptions: forward head/shoulders  Communication   Communication: No difficulties  Cognition Arousal/Alertness: Awake/alert Behavior During Therapy: Flat affect Overall Cognitive Status: Within Functional Limits for tasks assessed                                           General Comments  Nursing cleared pt for participation in physical therapy.  Pt agreeable to PT session and requesting to toilet during session.    Exercises  Mobility training   Assessment/Plan    PT Assessment Patient needs continued PT services  PT Problem List Decreased strength;Decreased activity tolerance;Decreased balance;Decreased mobility;Decreased knowledge of use of DME       PT Treatment Interventions DME instruction;Gait training;Stair training;Functional mobility training;Therapeutic activities;Therapeutic exercise;Balance training;Patient/family education    PT Goals (Current goals can be found in the Care Plan section)  Acute Rehab PT Goals Patient Stated Goal: to improve mobility PT Goal Formulation: With patient Time For Goal Achievement: 11/13/22 Potential to Achieve Goals: Good    Frequency Min 2X/week     Co-evaluation               AM-PAC PT "6 Clicks" Mobility  Outcome Measure Help needed turning from your back to your side while in a flat bed without using bedrails?: None Help needed moving from lying on your back to sitting on the side of a flat bed without using bedrails?: A Little Help needed moving to and from a bed to a chair (including a wheelchair)?: A Little  Help needed standing up from a chair using your arms (e.g., wheelchair or bedside chair)?: A Little Help needed to walk in hospital room?: A Little Help needed climbing 3-5 steps with a railing? : A Little 6 Click Score: 19    End of Session Equipment Utilized During Treatment: Gait belt Activity Tolerance: Patient tolerated treatment well Patient left: in bed;with call bell/phone within reach;with bed alarm set Nurse Communication: Mobility status;Precautions;Other (comment) (possible O2 concerns during mobility and pt reporting being on chronic 3 L O2 at home; pt with bowel movement) PT Visit Diagnosis: Other abnormalities of gait  and mobility (R26.89);Muscle weakness (generalized) (M62.81);History of falling (Z91.81)    Time: 2229-7989 PT Time Calculation (min) (ACUTE ONLY): 47 min   Charges:   PT Evaluation $PT Eval Low Complexity: 1 Low PT Treatments $Therapeutic Activity: 23-37 mins       Lexx Monte, PT 10/30/22, 11:00 AM

## 2022-10-30 NOTE — Progress Notes (Signed)
*  PRELIMINARY RESULTS* Echocardiogram 2D Echocardiogram has been performed.  [name] 10/30/2022, 12:07 PM

## 2022-10-31 LAB — HEMOGLOBIN A1C
Hgb A1c MFr Bld: 5.2 % (ref 4.8–5.6)
Mean Plasma Glucose: 103 mg/dL

## 2022-11-01 ENCOUNTER — Emergency Department: Payer: Medicare HMO

## 2022-11-01 ENCOUNTER — Emergency Department
Admission: EM | Admit: 2022-11-01 | Discharge: 2022-11-01 | Disposition: A | Discharge status: Home / Self Care | Payer: Medicare HMO | Attending: Emergency Medicine | Admitting: Emergency Medicine

## 2022-11-01 ENCOUNTER — Other Ambulatory Visit: Payer: Self-pay

## 2022-11-01 DIAGNOSIS — Z743 Need for continuous supervision: Secondary | ICD-10-CM | POA: Diagnosis not present

## 2022-11-01 DIAGNOSIS — I110 Hypertensive heart disease with heart failure: Secondary | ICD-10-CM | POA: Diagnosis not present

## 2022-11-01 DIAGNOSIS — K5900 Constipation, unspecified: Secondary | ICD-10-CM | POA: Diagnosis not present

## 2022-11-01 DIAGNOSIS — N281 Cyst of kidney, acquired: Secondary | ICD-10-CM | POA: Diagnosis not present

## 2022-11-01 DIAGNOSIS — I5032 Chronic diastolic (congestive) heart failure: Secondary | ICD-10-CM | POA: Diagnosis not present

## 2022-11-01 DIAGNOSIS — K429 Umbilical hernia without obstruction or gangrene: Secondary | ICD-10-CM | POA: Diagnosis not present

## 2022-11-01 DIAGNOSIS — I10 Essential (primary) hypertension: Secondary | ICD-10-CM | POA: Diagnosis not present

## 2022-11-01 DIAGNOSIS — Z8673 Personal history of transient ischemic attack (TIA), and cerebral infarction without residual deficits: Secondary | ICD-10-CM | POA: Insufficient documentation

## 2022-11-01 DIAGNOSIS — R300 Dysuria: Secondary | ICD-10-CM | POA: Diagnosis not present

## 2022-11-01 DIAGNOSIS — R109 Unspecified abdominal pain: Secondary | ICD-10-CM | POA: Diagnosis not present

## 2022-11-01 DIAGNOSIS — K8020 Calculus of gallbladder without cholecystitis without obstruction: Secondary | ICD-10-CM | POA: Diagnosis not present

## 2022-11-01 DIAGNOSIS — I480 Paroxysmal atrial fibrillation: Secondary | ICD-10-CM | POA: Diagnosis not present

## 2022-11-01 DIAGNOSIS — R1030 Lower abdominal pain, unspecified: Secondary | ICD-10-CM | POA: Diagnosis not present

## 2022-11-01 DIAGNOSIS — R1084 Generalized abdominal pain: Secondary | ICD-10-CM | POA: Diagnosis not present

## 2022-11-01 DIAGNOSIS — J449 Chronic obstructive pulmonary disease, unspecified: Secondary | ICD-10-CM | POA: Insufficient documentation

## 2022-11-01 LAB — CBC
HCT: 39.1 % (ref 39.0–52.0)
Hemoglobin: 12.8 g/dL — ABNORMAL LOW (ref 13.0–17.0)
MCH: 32.7 pg (ref 26.0–34.0)
MCHC: 32.7 g/dL (ref 30.0–36.0)
MCV: 100 fL (ref 80.0–100.0)
Platelets: 229 10*3/uL (ref 150–400)
RBC: 3.91 MIL/uL — ABNORMAL LOW (ref 4.22–5.81)
RDW: 13.5 % (ref 11.5–15.5)
WBC: 5.2 10*3/uL (ref 4.0–10.5)
nRBC: 0 % (ref 0.0–0.2)

## 2022-11-01 LAB — URINALYSIS, ROUTINE W REFLEX MICROSCOPIC
Bilirubin Urine: NEGATIVE
Glucose, UA: NEGATIVE mg/dL
Ketones, ur: NEGATIVE mg/dL
Nitrite: NEGATIVE
Protein, ur: 100 mg/dL — AB
Specific Gravity, Urine: 1.029 (ref 1.005–1.030)
WBC, UA: >50 WBC/hpf — ABNORMAL HIGH (ref 0–5)
pH: 5 (ref 5.0–8.0)

## 2022-11-01 LAB — COMPREHENSIVE METABOLIC PANEL
ALT: 18 U/L (ref 0–44)
AST: 31 U/L (ref 15–41)
Albumin: 3.8 g/dL (ref 3.5–5.0)
Alkaline Phosphatase: 60 U/L (ref 38–126)
Anion gap: 10 (ref 5–15)
BUN: 23 mg/dL — ABNORMAL HIGH (ref 6–20)
CO2: 30 mmol/L (ref 22–32)
Calcium: 9.7 mg/dL (ref 8.9–10.3)
Chloride: 99 mmol/L (ref 98–111)
Creatinine, Ser: 1.08 mg/dL (ref 0.61–1.24)
GFR, Estimated: >60 mL/min (ref >60)
Glucose, Bld: 79 mg/dL (ref 70–99)
Potassium: 3.6 mmol/L (ref 3.5–5.1)
Sodium: 139 mmol/L (ref 135–145)
Total Bilirubin: 0.9 mg/dL (ref 0.3–1.2)
Total Protein: 7.8 g/dL (ref 6.5–8.1)

## 2022-11-01 LAB — LIPASE, BLOOD: Lipase: 32 U/L (ref 11–51)

## 2022-11-01 MED ORDER — IOHEXOL 300 MG/ML  SOLN
100.0000 mL | Freq: Once | INTRAMUSCULAR | Status: AC | PRN
  Administered 2022-11-01: 100 mL via INTRAVENOUS

## 2022-11-01 MED ORDER — CIPROFLOXACIN HCL 500 MG PO TABS: 500.0000 mg | ORAL_TABLET | Freq: Two times a day (BID) | ORAL | 0 refills | Status: AC

## 2022-11-01 NOTE — ED Provider Notes (Signed)
[facility] Provider Note    Event Date/Time   First MD Initiated Contact with Patient 11/01/22 1938     (approximate)   History   Abdominal Pain   HPI  Andrew Sparks is a 56 y.o. male with a past medical history of hemiparesis with left-sided weakness, depression, paroxysmal atrial fibrillation, heart failure, depression, COPD on chronic O2 who presents today for evaluation of abdominal pain for the past 1 day.  He reports that he feels like he cannot poop.  He reports that he pooped earlier today.  He has not had any vomiting.  No fevers or chills.  No urinary problems.  Patient Active Problem List   Diagnosis Date Noted   Hypertensive urgency 10/29/2022   Alcohol use disorder 10/29/2022   Hemiparesis affecting left side as late effect of cerebrovascular accident (CVA) (HCC) 10/29/2022   Seizure (HCC) 10/29/2022   Elevated troponin 10/29/2022   Hypotension 07/02/2022   Dyslipidemia 07/02/2022   Major depressive disorder, recurrent severe without psychotic features (HCC) 06/30/2022   Paroxysmal atrial fibrillation (HCC)    Vitamin B12 deficiency    AKI (acute kidney injury) (HCC) 03/28/2021   Hyponatremia 04/20/2020   Chronic diastolic CHF (congestive heart failure) (HCC)    Atrial fibrillation, chronic (HCC)    COPD with acute exacerbation (HCC)    Depression    Alcohol abuse    Tobacco abuse    CHF (congestive heart failure) (HCC) 07/17/2017   Panic disorder 01/18/2017   Chest pain 01/17/2017   Obstructive sleep apnea syndrome 02/01/2016          Physical Exam   Triage Vital Signs: ED Triage Vitals  Enc Vitals Group     BP 11/01/22 1720 (!) 135/90     Pulse Rate 11/01/22 1720 84     Resp 11/01/22 1720 18     Temp 11/01/22 1720 97.9 F (36.6 C)     Temp src --      SpO2 11/01/22 1720 97 %     Weight --      Height --      Head Circumference --      Peak Flow --      Pain Score 11/01/22 1718 8     Pain Loc --       Pain Edu? --      Excl. in GC? --     Most recent vital signs: Vitals:   11/01/22 2017 11/01/22 2237  BP: (!) 151/117 (!) 156/97  Pulse: 60 62  Resp: 20 19  Temp:    SpO2: 98% 97%    Physical Exam Vitals and nursing note reviewed.  Constitutional:      General: Awake and alert. No acute distress.    Appearance: Normal appearance. The patient is normal weight.  HENT:     Head: Normocephalic and atraumatic.     Mouth: Mucous membranes are moist.  Eyes:     General: PERRL. Normal EOMs        Right eye: No discharge.        Left eye: No discharge.     Conjunctiva/sclera: Conjunctivae normal.  Cardiovascular:     Rate and Rhythm: Normal rate and regular rhythm.     Pulses: Normal pulses.     Heart sounds: Normal heart sounds Pulmonary:     Effort: Pulmonary effort is normal. No respiratory distress.     Breath sounds: Normal breath sounds.  On 2 L nasal cannula Abdominal:  Abdomen is soft. There is a reducible periumbilical hernia without skin changes.  There is diffuse abdominal tenderness. No rebound or guarding. No distention. Musculoskeletal:        General: No swelling. Normal range of motion.     Cervical back: Normal range of motion and neck supple.  Skin:    General: Skin is warm and dry.     Capillary Refill: Capillary refill takes less than 2 seconds.     Findings: No rash.  Neurological:     Mental Status: The patient is awake and alert.      ED Results / Procedures / Treatments   Labs (all labs ordered are listed, but only abnormal results are displayed) Labs Reviewed  COMPREHENSIVE METABOLIC PANEL - Abnormal; Notable for the following components:      Result Value   BUN 23 (*)    All other components within normal limits  CBC - Abnormal; Notable for the following components:   RBC 3.91 (*)    Hemoglobin 12.8 (*)    All other components within normal limits  URINALYSIS, ROUTINE W REFLEX MICROSCOPIC - Abnormal; Notable for the following  components:   Color, Urine YELLOW (*)    APPearance HAZY (*)    Hgb urine dipstick SMALL (*)    Protein, ur 100 (*)    Leukocytes,Ua LARGE (*)    WBC, UA >50 (*)    Bacteria, UA FEW (*)    All other components within normal limits  LIPASE, BLOOD     EKG     RADIOLOGY I independently reviewed and interpreted imaging and agree with radiologists findings.     PROCEDURES:  Critical Care performed:   Procedures   MEDICATIONS ORDERED IN ED: Medications  iohexol (OMNIPAQUE) 300 MG/ML solution 100 mL (100 mLs Intravenous Contrast Given 11/01/22 2043)     IMPRESSION / MDM / ASSESSMENT AND PLAN / ED COURSE  I reviewed the triage vital signs and the nursing notes.   Differential diagnosis includes, but is not limited to, constipation, small bowel obstruction, gastroenteritis, diverticulitis, appendicitis, incarcerated hernia.  Patient is awake and alert, hemodynamically stable and afebrile.  He is on 2 L of nasal cannula per his baseline.  Labs obtained in triage are overall reassuring.  Given his continued abdominal pain, patient agreed to CT scan.  CT was negative for any acute findings.  Patient's urine does appear to be infected.  I suspect that he may have a component of prostatitis given that he feels like he has pain when he tries to poop.  No abnormalities noted externally.  Originally plan on using levofloxacin, however because patient is on amiodarone, this puts him at increased risk for QTc prolongation.  Cipro apparently does not have the same risk.  I cautioned him about the side effects of this medication. Recommend close outpatient follow-up with urology as this antibiotic course may need to be prolonged.  Patient understands and agrees.  He agrees to call urology tomorrow.  We discussed return precautions.  He was discharged in stable condition.  Patient's presentation is most consistent with acute complicated illness / injury requiring diagnostic workup.        FINAL CLINICAL IMPRESSION(S) / ED DIAGNOSES   Final diagnoses:  Dysuria     Rx / DC Orders   ED Discharge Orders          Ordered    ciprofloxacin (CIPRO) 500 MG tablet  2 times daily        11/01/22  2221             Note:  This document was prepared using Dragon voice recognition software and may include unintentional dictation errors.   Keturah Shavers 11/01/22 2249    Minna Antis, MD 11/01/22 2251

## 2022-11-01 NOTE — ED Triage Notes (Signed)
Pt comes with c/o lower belly pain that started yesterday. Pt denies any vomiting. Pt states 8/10 pain. Pt states last BM yesterday.

## 2022-11-01 NOTE — Discharge Instructions (Addendum)
Please take the antibiotic as prescribed.  Please follow-up with urology.  Please return for any new, worsening, or change in symptoms or other concerns.

## 2022-11-01 NOTE — ED Notes (Signed)
Patient transported to CT 

## 2022-11-02 ENCOUNTER — Telehealth: Payer: Self-pay

## 2022-11-02 NOTE — Telephone Encounter (Signed)
        Patient  visited [place] on 11/29    Telephone encounter attempt :  1st   A HIPAA compliant voice message was left requesting a return call.  Instructed patient to call back .al

## 2022-11-06 ENCOUNTER — Telehealth: Payer: Self-pay

## 2022-11-06 NOTE — Telephone Encounter (Signed)
        Patient  visited [place] on 11/30    Telephone encounter attempt :  2nd  A HIPAA compliant voice message was left requesting a return call.  Instructed patient to call back     Rickard Kennerly [facility] Guide, , Care Management  [phone] [address], [place], [place] [address] Phone: [phone] Email: [email or web address]       

## 2022-11-19 DIAGNOSIS — Z008 Encounter for other general examination: Secondary | ICD-10-CM | POA: Diagnosis not present

## 2022-11-19 DIAGNOSIS — R69 Illness, unspecified: Secondary | ICD-10-CM | POA: Diagnosis not present

## 2022-11-19 DIAGNOSIS — K219 Gastro-esophageal reflux disease without esophagitis: Secondary | ICD-10-CM | POA: Diagnosis not present

## 2022-11-19 DIAGNOSIS — E785 Hyperlipidemia, unspecified: Secondary | ICD-10-CM | POA: Diagnosis not present

## 2022-11-19 DIAGNOSIS — J449 Chronic obstructive pulmonary disease, unspecified: Secondary | ICD-10-CM | POA: Diagnosis not present

## 2022-11-19 DIAGNOSIS — I252 Old myocardial infarction: Secondary | ICD-10-CM | POA: Diagnosis not present

## 2022-11-19 DIAGNOSIS — I4891 Unspecified atrial fibrillation: Secondary | ICD-10-CM | POA: Diagnosis not present

## 2022-11-19 DIAGNOSIS — I509 Heart failure, unspecified: Secondary | ICD-10-CM | POA: Diagnosis not present

## 2022-11-19 DIAGNOSIS — E669 Obesity, unspecified: Secondary | ICD-10-CM | POA: Diagnosis not present

## 2022-11-19 DIAGNOSIS — G40909 Epilepsy, unspecified, not intractable, without status epilepticus: Secondary | ICD-10-CM | POA: Diagnosis not present

## 2022-11-19 DIAGNOSIS — I69354 Hemiplegia and hemiparesis following cerebral infarction affecting left non-dominant side: Secondary | ICD-10-CM | POA: Diagnosis not present

## 2022-11-19 DIAGNOSIS — F419 Anxiety disorder, unspecified: Secondary | ICD-10-CM | POA: Diagnosis not present

## 2022-11-19 DIAGNOSIS — I739 Peripheral vascular disease, unspecified: Secondary | ICD-10-CM | POA: Diagnosis not present

## 2023-01-12 ENCOUNTER — Telehealth: Payer: Self-pay

## 2023-01-12 NOTE — Telephone Encounter (Signed)
Pt wife lm stating that he is in need of his seizure medication, I checked EHR and our new system and didn't see one in there

## 2023-01-14 ENCOUNTER — Inpatient Hospital Stay
Admission: EM | Admit: 2023-01-14 | Discharge: 2023-01-24 | DRG: 871 | Disposition: A | Discharge status: Home Health | Payer: Medicare HMO | Attending: Osteopathic Medicine | Admitting: Osteopathic Medicine

## 2023-01-14 ENCOUNTER — Emergency Department: Payer: Medicare HMO

## 2023-01-14 DIAGNOSIS — Y908 Blood alcohol level of 240 mg/100 ml or more: Secondary | ICD-10-CM | POA: Diagnosis present

## 2023-01-14 DIAGNOSIS — D696 Thrombocytopenia, unspecified: Secondary | ICD-10-CM | POA: Diagnosis present

## 2023-01-14 DIAGNOSIS — Z6835 Body mass index (BMI) 35.0-35.9, adult: Secondary | ICD-10-CM

## 2023-01-14 DIAGNOSIS — E875 Hyperkalemia: Secondary | ICD-10-CM | POA: Diagnosis present

## 2023-01-14 DIAGNOSIS — R69 Illness, unspecified: Secondary | ICD-10-CM | POA: Diagnosis not present

## 2023-01-14 DIAGNOSIS — D6959 Other secondary thrombocytopenia: Secondary | ICD-10-CM | POA: Diagnosis not present

## 2023-01-14 DIAGNOSIS — G4733 Obstructive sleep apnea (adult) (pediatric): Secondary | ICD-10-CM | POA: Diagnosis present

## 2023-01-14 DIAGNOSIS — Z9981 Dependence on supplemental oxygen: Secondary | ICD-10-CM

## 2023-01-14 DIAGNOSIS — Z91141 Patient's other noncompliance with medication regimen due to financial hardship: Secondary | ICD-10-CM

## 2023-01-14 DIAGNOSIS — I252 Old myocardial infarction: Secondary | ICD-10-CM | POA: Diagnosis not present

## 2023-01-14 DIAGNOSIS — J984 Other disorders of lung: Secondary | ICD-10-CM | POA: Diagnosis not present

## 2023-01-14 DIAGNOSIS — D7589 Other specified diseases of blood and blood-forming organs: Secondary | ICD-10-CM | POA: Diagnosis present

## 2023-01-14 DIAGNOSIS — G40909 Epilepsy, unspecified, not intractable, without status epilepticus: Secondary | ICD-10-CM | POA: Diagnosis present

## 2023-01-14 DIAGNOSIS — F1010 Alcohol abuse, uncomplicated: Secondary | ICD-10-CM | POA: Diagnosis not present

## 2023-01-14 DIAGNOSIS — W07XXXA Fall from chair, initial encounter: Secondary | ICD-10-CM | POA: Diagnosis not present

## 2023-01-14 DIAGNOSIS — F10129 Alcohol abuse with intoxication, unspecified: Secondary | ICD-10-CM | POA: Diagnosis not present

## 2023-01-14 DIAGNOSIS — F10929 Alcohol use, unspecified with intoxication, unspecified: Secondary | ICD-10-CM

## 2023-01-14 DIAGNOSIS — N179 Acute kidney failure, unspecified: Secondary | ICD-10-CM | POA: Diagnosis not present

## 2023-01-14 DIAGNOSIS — D638 Anemia in other chronic diseases classified elsewhere: Secondary | ICD-10-CM | POA: Diagnosis present

## 2023-01-14 DIAGNOSIS — E669 Obesity, unspecified: Secondary | ICD-10-CM | POA: Diagnosis not present

## 2023-01-14 DIAGNOSIS — F332 Major depressive disorder, recurrent severe without psychotic features: Secondary | ICD-10-CM | POA: Diagnosis present

## 2023-01-14 DIAGNOSIS — A419 Sepsis, unspecified organism: Secondary | ICD-10-CM | POA: Diagnosis not present

## 2023-01-14 DIAGNOSIS — R55 Syncope and collapse: Secondary | ICD-10-CM | POA: Diagnosis not present

## 2023-01-14 DIAGNOSIS — R6520 Severe sepsis without septic shock: Secondary | ICD-10-CM | POA: Diagnosis not present

## 2023-01-14 DIAGNOSIS — J189 Pneumonia, unspecified organism: Secondary | ICD-10-CM

## 2023-01-14 DIAGNOSIS — I5032 Chronic diastolic (congestive) heart failure: Secondary | ICD-10-CM | POA: Diagnosis present

## 2023-01-14 DIAGNOSIS — Z8673 Personal history of transient ischemic attack (TIA), and cerebral infarction without residual deficits: Secondary | ICD-10-CM

## 2023-01-14 DIAGNOSIS — Z720 Tobacco use: Secondary | ICD-10-CM | POA: Diagnosis present

## 2023-01-14 DIAGNOSIS — Z1152 Encounter for screening for COVID-19: Secondary | ICD-10-CM | POA: Diagnosis not present

## 2023-01-14 DIAGNOSIS — K8020 Calculus of gallbladder without cholecystitis without obstruction: Secondary | ICD-10-CM | POA: Diagnosis not present

## 2023-01-14 DIAGNOSIS — I480 Paroxysmal atrial fibrillation: Secondary | ICD-10-CM | POA: Diagnosis present

## 2023-01-14 DIAGNOSIS — J690 Pneumonitis due to inhalation of food and vomit: Secondary | ICD-10-CM | POA: Diagnosis present

## 2023-01-14 DIAGNOSIS — G629 Polyneuropathy, unspecified: Secondary | ICD-10-CM | POA: Diagnosis present

## 2023-01-14 DIAGNOSIS — K760 Fatty (change of) liver, not elsewhere classified: Secondary | ICD-10-CM | POA: Diagnosis not present

## 2023-01-14 DIAGNOSIS — J9601 Acute respiratory failure with hypoxia: Secondary | ICD-10-CM

## 2023-01-14 DIAGNOSIS — J9621 Acute and chronic respiratory failure with hypoxia: Secondary | ICD-10-CM | POA: Diagnosis present

## 2023-01-14 DIAGNOSIS — J90 Pleural effusion, not elsewhere classified: Secondary | ICD-10-CM | POA: Diagnosis not present

## 2023-01-14 DIAGNOSIS — I2510 Atherosclerotic heart disease of native coronary artery without angina pectoris: Secondary | ICD-10-CM | POA: Diagnosis present

## 2023-01-14 DIAGNOSIS — Z79899 Other long term (current) drug therapy: Secondary | ICD-10-CM

## 2023-01-14 DIAGNOSIS — R7402 Elevation of levels of lactic acid dehydrogenase (LDH): Secondary | ICD-10-CM | POA: Diagnosis present

## 2023-01-14 DIAGNOSIS — I110 Hypertensive heart disease with heart failure: Secondary | ICD-10-CM | POA: Diagnosis not present

## 2023-01-14 DIAGNOSIS — R569 Unspecified convulsions: Secondary | ICD-10-CM

## 2023-01-14 DIAGNOSIS — F17210 Nicotine dependence, cigarettes, uncomplicated: Secondary | ICD-10-CM | POA: Diagnosis present

## 2023-01-14 DIAGNOSIS — F419 Anxiety disorder, unspecified: Secondary | ICD-10-CM | POA: Diagnosis present

## 2023-01-14 DIAGNOSIS — G4710 Hypersomnia, unspecified: Secondary | ICD-10-CM | POA: Diagnosis present

## 2023-01-14 DIAGNOSIS — R45851 Suicidal ideations: Secondary | ICD-10-CM | POA: Diagnosis not present

## 2023-01-14 DIAGNOSIS — I509 Heart failure, unspecified: Secondary | ICD-10-CM | POA: Diagnosis not present

## 2023-01-14 DIAGNOSIS — E785 Hyperlipidemia, unspecified: Secondary | ICD-10-CM | POA: Diagnosis present

## 2023-01-14 DIAGNOSIS — I10 Essential (primary) hypertension: Secondary | ICD-10-CM | POA: Diagnosis present

## 2023-01-14 DIAGNOSIS — Z716 Tobacco abuse counseling: Secondary | ICD-10-CM

## 2023-01-14 DIAGNOSIS — R000 Tachycardia, unspecified: Secondary | ICD-10-CM | POA: Diagnosis not present

## 2023-01-14 DIAGNOSIS — T4276XA Underdosing of unspecified antiepileptic and sedative-hypnotic drugs, initial encounter: Secondary | ICD-10-CM | POA: Diagnosis present

## 2023-01-14 DIAGNOSIS — E872 Acidosis, unspecified: Secondary | ICD-10-CM | POA: Diagnosis present

## 2023-01-14 DIAGNOSIS — Z7901 Long term (current) use of anticoagulants: Secondary | ICD-10-CM

## 2023-01-14 DIAGNOSIS — Z8249 Family history of ischemic heart disease and other diseases of the circulatory system: Secondary | ICD-10-CM

## 2023-01-14 DIAGNOSIS — Z7141 Alcohol abuse counseling and surveillance of alcoholic: Secondary | ICD-10-CM

## 2023-01-14 DIAGNOSIS — R7989 Other specified abnormal findings of blood chemistry: Secondary | ICD-10-CM | POA: Diagnosis not present

## 2023-01-14 DIAGNOSIS — J9690 Respiratory failure, unspecified, unspecified whether with hypoxia or hypercapnia: Secondary | ICD-10-CM | POA: Diagnosis not present

## 2023-01-14 LAB — URINALYSIS, ROUTINE W REFLEX MICROSCOPIC
Bilirubin Urine: NEGATIVE
Glucose, UA: NEGATIVE mg/dL
Ketones, ur: NEGATIVE mg/dL
Nitrite: NEGATIVE
Protein, ur: >=300 mg/dL — AB
Specific Gravity, Urine: 1.022 (ref 1.005–1.030)
pH: 5 (ref 5.0–8.0)

## 2023-01-14 LAB — CBC WITH DIFFERENTIAL/PLATELET
Abs Immature Granulocytes: 0.06 10*3/uL (ref 0.00–0.07)
Abs Immature Granulocytes: 0.06 10*3/uL (ref 0.00–0.07)
Basophils Absolute: 0 10*3/uL (ref 0.0–0.1)
Basophils Absolute: 0 10*3/uL (ref 0.0–0.1)
Basophils Relative: 0 %
Basophils Relative: 0 %
Eosinophils Absolute: 0 10*3/uL (ref 0.0–0.5)
Eosinophils Absolute: 0 10*3/uL (ref 0.0–0.5)
Eosinophils Relative: 0 %
Eosinophils Relative: 0 %
HCT: 40.2 % (ref 39.0–52.0)
HCT: 35.6 % — ABNORMAL LOW (ref 39.0–52.0)
Hemoglobin: 13 g/dL (ref 13.0–17.0)
Hemoglobin: 11.5 g/dL — ABNORMAL LOW (ref 13.0–17.0)
Immature Granulocytes: 1 %
Immature Granulocytes: 1 %
Lymphocytes Relative: 6 %
Lymphocytes Relative: 11 %
Lymphs Abs: 0.4 10*3/uL — ABNORMAL LOW (ref 0.7–4.0)
Lymphs Abs: 0.7 10*3/uL (ref 0.7–4.0)
MCH: 33.6 pg (ref 26.0–34.0)
MCH: 33.8 pg (ref 26.0–34.0)
MCHC: 32.3 g/dL (ref 30.0–36.0)
MCHC: 32.3 g/dL (ref 30.0–36.0)
MCV: 103.9 fL — ABNORMAL HIGH (ref 80.0–100.0)
MCV: 104.7 fL — ABNORMAL HIGH (ref 80.0–100.0)
Monocytes Absolute: 0.3 10*3/uL (ref 0.1–1.0)
Monocytes Absolute: 0.5 10*3/uL (ref 0.1–1.0)
Monocytes Relative: 5 %
Monocytes Relative: 8 %
Neutro Abs: 5.4 10*3/uL (ref 1.7–7.7)
Neutro Abs: 4.8 10*3/uL (ref 1.7–7.7)
Neutrophils Relative %: 88 %
Neutrophils Relative %: 80 %
Platelets: 54 10*3/uL — ABNORMAL LOW (ref 150–400)
Platelets: 47 10*3/uL — ABNORMAL LOW (ref 150–400)
RBC: 3.87 MIL/uL — ABNORMAL LOW (ref 4.22–5.81)
RBC: 3.4 MIL/uL — ABNORMAL LOW (ref 4.22–5.81)
RDW: 17.6 % — ABNORMAL HIGH (ref 11.5–15.5)
RDW: 17.5 % — ABNORMAL HIGH (ref 11.5–15.5)
Smear Review: DECREASED
WBC: 6.1 10*3/uL (ref 4.0–10.5)
WBC: 6.1 10*3/uL (ref 4.0–10.5)
nRBC: 0.8 % — ABNORMAL HIGH (ref 0.0–0.2)
nRBC: 1.2 % — ABNORMAL HIGH (ref 0.0–0.2)

## 2023-01-14 LAB — PLATELET COUNT: Platelets: 49 10*3/uL — ABNORMAL LOW (ref 150–400)

## 2023-01-14 LAB — VITAMIN B12: Vitamin B-12: 658 pg/mL (ref 180–914)

## 2023-01-14 LAB — RETICULOCYTES
Immature Retic Fract: 28.3 % — ABNORMAL HIGH (ref 2.3–15.9)
RBC.: 3.39 MIL/uL — ABNORMAL LOW (ref 4.22–5.81)
Retic Count, Absolute: 134.2 10*3/uL (ref 19.0–186.0)
Retic Ct Pct: 4 % — ABNORMAL HIGH (ref 0.4–3.1)

## 2023-01-14 LAB — COMPREHENSIVE METABOLIC PANEL
ALT: 19 U/L (ref 0–44)
AST: 50 U/L — ABNORMAL HIGH (ref 15–41)
Albumin: 3.8 g/dL (ref 3.5–5.0)
Alkaline Phosphatase: 83 U/L (ref 38–126)
Anion gap: 20 — ABNORMAL HIGH (ref 5–15)
BUN: 26 mg/dL — ABNORMAL HIGH (ref 6–20)
CO2: 24 mmol/L (ref 22–32)
Calcium: 8.1 mg/dL — ABNORMAL LOW (ref 8.9–10.3)
Chloride: 94 mmol/L — ABNORMAL LOW (ref 98–111)
Creatinine, Ser: 1.29 mg/dL — ABNORMAL HIGH (ref 0.61–1.24)
GFR, Estimated: >60 mL/min (ref >60)
Glucose, Bld: 75 mg/dL (ref 70–99)
Potassium: 4.5 mmol/L (ref 3.5–5.1)
Sodium: 138 mmol/L (ref 135–145)
Total Bilirubin: 2.1 mg/dL — ABNORMAL HIGH (ref 0.3–1.2)
Total Protein: 7.8 g/dL (ref 6.5–8.1)

## 2023-01-14 LAB — ETHANOL: Alcohol, Ethyl (B): 419 mg/dL (ref <10)

## 2023-01-14 LAB — TECHNOLOGIST SMEAR REVIEW: Plt Morphology: DECREASED

## 2023-01-14 LAB — TROPONIN I (HIGH SENSITIVITY)
Troponin I (High Sensitivity): 30 ng/L — ABNORMAL HIGH (ref <18)
Troponin I (High Sensitivity): 42 ng/L — ABNORMAL HIGH (ref <18)

## 2023-01-14 LAB — URINE DRUG SCREEN, QUALITATIVE (ARMC ONLY)
Amphetamines, Ur Screen: NOT DETECTED
Barbiturates, Ur Screen: NOT DETECTED
Benzodiazepine, Ur Scrn: NOT DETECTED
Cannabinoid 50 Ng, Ur ~~LOC~~: NOT DETECTED
Cocaine Metabolite,Ur ~~LOC~~: NOT DETECTED
MDMA (Ecstasy)Ur Screen: NOT DETECTED
Methadone Scn, Ur: NOT DETECTED
Opiate, Ur Screen: NOT DETECTED
Phencyclidine (PCP) Ur S: NOT DETECTED
Tricyclic, Ur Screen: NOT DETECTED

## 2023-01-14 LAB — RESP PANEL BY RT-PCR (RSV, FLU A&B, COVID)  RVPGX2
Influenza A by PCR: NEGATIVE
Influenza B by PCR: NEGATIVE
Resp Syncytial Virus by PCR: NEGATIVE
SARS Coronavirus 2 by RT PCR: NEGATIVE

## 2023-01-14 LAB — LACTIC ACID, PLASMA
Lactic Acid, Venous: 3.4 mmol/L (ref 0.5–1.9)
Lactic Acid, Venous: 2.9 mmol/L (ref 0.5–1.9)
Lactic Acid, Venous: 1.7 mmol/L (ref 0.5–1.9)
Lactic Acid, Venous: 1.7 mmol/L (ref 0.5–1.9)

## 2023-01-14 LAB — BRAIN NATRIURETIC PEPTIDE: B Natriuretic Peptide: 227.3 pg/mL — ABNORMAL HIGH (ref 0.0–100.0)

## 2023-01-14 LAB — PROCALCITONIN: Procalcitonin: 0.11 ng/mL

## 2023-01-14 LAB — MAGNESIUM: Magnesium: 1.6 mg/dL — ABNORMAL LOW (ref 1.7–2.4)

## 2023-01-14 LAB — MRSA NEXT GEN BY PCR, NASAL: MRSA by PCR Next Gen: NOT DETECTED

## 2023-01-14 LAB — PROTIME-INR
INR: 1.1 (ref 0.8–1.2)
Prothrombin Time: 13.9 seconds (ref 11.4–15.2)

## 2023-01-14 MED ORDER — DEXTROSE IN LACTATED RINGERS 5 % IV SOLN
1000.0000 mL | Freq: Once | INTRAVENOUS | Status: AC
  Administered 2023-01-14: 1000 mL via INTRAVENOUS

## 2023-01-14 MED ORDER — LORAZEPAM 2 MG/ML IJ SOLN
0.0000 mg | Freq: Two times a day (BID) | INTRAMUSCULAR | Status: AC
  Administered 2023-01-16: 1 mg via INTRAVENOUS
  Filled 2023-01-14: qty 1

## 2023-01-14 MED ORDER — THIAMINE HCL 100 MG/ML IJ SOLN
100.0000 mg | Freq: Every day | INTRAMUSCULAR | Status: DC
  Administered 2023-01-14: 100 mg via INTRAVENOUS
  Filled 2023-01-14 (×2): qty 2

## 2023-01-14 MED ORDER — APIXABAN 5 MG PO TABS: 5.0000 mg | ORAL_TABLET | Freq: Two times a day (BID) | ORAL | Status: DC

## 2023-01-14 MED ORDER — ACETAMINOPHEN 650 MG RE SUPP: 650.0000 mg | Freq: Four times a day (QID) | RECTAL | Status: DC | PRN

## 2023-01-14 MED ORDER — ONDANSETRON HCL 4 MG PO TABS: 4.0000 mg | ORAL_TABLET | Freq: Four times a day (QID) | ORAL | Status: DC | PRN

## 2023-01-14 MED ORDER — SODIUM CHLORIDE 0.9% FLUSH
3.0000 mL | Freq: Two times a day (BID) | INTRAVENOUS | Status: DC
  Administered 2023-01-15 – 2023-01-16 (×5): 3 mL via INTRAVENOUS

## 2023-01-14 MED ORDER — SODIUM CHLORIDE 0.9 % IV SOLN
500.0000 mg | INTRAVENOUS | Status: AC
  Administered 2023-01-14 – 2023-01-18 (×5): 500 mg via INTRAVENOUS
  Filled 2023-01-14 (×5): qty 5

## 2023-01-14 MED ORDER — LORAZEPAM 2 MG PO TABS
0.0000 mg | ORAL_TABLET | Freq: Four times a day (QID) | ORAL | Status: AC
  Administered 2023-01-14: 2 mg via ORAL
  Administered 2023-01-15: 4 mg via ORAL
  Filled 2023-01-14: qty 1
  Filled 2023-01-14: qty 2

## 2023-01-14 MED ORDER — LORAZEPAM 2 MG PO TABS
0.0000 mg | ORAL_TABLET | Freq: Two times a day (BID) | ORAL | Status: AC
  Administered 2023-01-17 – 2023-01-18 (×2): 1 mg via ORAL
  Filled 2023-01-14 (×2): qty 1

## 2023-01-14 MED ORDER — LACTATED RINGERS IV SOLN: INTRAVENOUS | Status: DC

## 2023-01-14 MED ORDER — LEVETIRACETAM 500 MG PO TABS
500.0000 mg | ORAL_TABLET | Freq: Two times a day (BID) | ORAL | Status: DC
  Administered 2023-01-14 – 2023-01-24 (×21): 500 mg via ORAL
  Filled 2023-01-14 (×21): qty 1

## 2023-01-14 MED ORDER — LEVETIRACETAM IN NACL 1500 MG/100ML IV SOLN
1500.0000 mg | Freq: Once | INTRAVENOUS | Status: AC
  Administered 2023-01-14: 1500 mg via INTRAVENOUS
  Filled 2023-01-14: qty 100

## 2023-01-14 MED ORDER — THIAMINE MONONITRATE 100 MG PO TABS
100.0000 mg | ORAL_TABLET | Freq: Every day | ORAL | Status: DC
  Administered 2023-01-15 – 2023-01-24 (×10): 100 mg via ORAL
  Filled 2023-01-14 (×10): qty 1

## 2023-01-14 MED ORDER — SODIUM CHLORIDE 0.9 % IV SOLN
3.0000 g | Freq: Once | INTRAVENOUS | Status: AC
  Administered 2023-01-14: 3 g via INTRAVENOUS
  Filled 2023-01-14: qty 8

## 2023-01-14 MED ORDER — SODIUM CHLORIDE 0.9 % IV SOLN
3.0000 g | Freq: Four times a day (QID) | INTRAVENOUS | Status: DC
  Administered 2023-01-14 – 2023-01-15 (×3): 3 g via INTRAVENOUS
  Filled 2023-01-14 (×3): qty 8

## 2023-01-14 MED ORDER — ONDANSETRON HCL 4 MG/2ML IJ SOLN
4.0000 mg | Freq: Four times a day (QID) | INTRAMUSCULAR | Status: DC | PRN
  Administered 2023-01-16: 4 mg via INTRAVENOUS
  Filled 2023-01-14: qty 2

## 2023-01-14 MED ORDER — SODIUM CHLORIDE 0.9% FLUSH: 3.0000 mL | INTRAVENOUS | Status: DC | PRN

## 2023-01-14 MED ORDER — POLYETHYLENE GLYCOL 3350 17 G PO PACK: 17.0000 g | PACK | Freq: Every day | ORAL | Status: DC | PRN

## 2023-01-14 MED ORDER — SODIUM CHLORIDE 0.9 % IV SOLN: 250.0000 mL | INTRAVENOUS | Status: DC | PRN

## 2023-01-14 MED ORDER — LORAZEPAM 2 MG/ML IJ SOLN
0.0000 mg | Freq: Four times a day (QID) | INTRAMUSCULAR | Status: AC
  Administered 2023-01-15 (×2): 1 mg via INTRAVENOUS
  Administered 2023-01-16: 2 mg via INTRAVENOUS
  Filled 2023-01-14 (×5): qty 1

## 2023-01-14 MED ORDER — METOPROLOL TARTRATE 50 MG PO TABS
50.0000 mg | ORAL_TABLET | Freq: Two times a day (BID) | ORAL | Status: DC
  Administered 2023-01-14 – 2023-01-15 (×2): 50 mg via ORAL
  Filled 2023-01-14 (×2): qty 1

## 2023-01-14 MED ORDER — NICOTINE 14 MG/24HR TD PT24
14.0000 mg | MEDICATED_PATCH | Freq: Every day | TRANSDERMAL | Status: DC
  Administered 2023-01-14 – 2023-01-24 (×11): 14 mg via TRANSDERMAL
  Filled 2023-01-14 (×11): qty 1

## 2023-01-14 MED ORDER — AMIODARONE HCL 200 MG PO TABS
200.0000 mg | ORAL_TABLET | Freq: Every day | ORAL | Status: DC
  Administered 2023-01-14 – 2023-01-24 (×11): 200 mg via ORAL
  Filled 2023-01-14 (×11): qty 1

## 2023-01-14 MED ORDER — ACETAMINOPHEN 325 MG PO TABS
650.0000 mg | ORAL_TABLET | Freq: Four times a day (QID) | ORAL | Status: DC | PRN
  Administered 2023-01-16 – 2023-01-18 (×3): 650 mg via ORAL
  Filled 2023-01-14 (×3): qty 2

## 2023-01-14 NOTE — Consult Note (Signed)
Pharmacy Antibiotic Note  Andrew Sparks is a 57 y.o. male with history of seizures, alcohol use disorder, Afib, HTN, CAD, OSA, stroke, chronic respiratory failure on supplemental oxygen admitted on 01/14/2023 with  seizure like activity .  Pharmacy has been consulted for Unasyn dosing for aspiration pneumonia.  Plan:  Unasyn 3 g IV q6h  Height: 5' 11"$  (180.3 cm) Weight: 108.9 kg (240 lb) IBW/kg (Calculated) : 75.3  Temp (24hrs), Avg:97.5 F (36.4 C), Min:97.5 F (36.4 C), Max:97.5 F (36.4 C)  Recent Labs  Lab 01/14/23 0400 01/14/23 0605  WBC 6.1  --   CREATININE 1.29*  --   LATICACIDVEN 3.4* 2.9*    Estimated Creatinine Clearance: 80.2 mL/min (A) (by C-G formula based on SCr of 1.29 mg/dL (H)).    No Known Allergies  Antimicrobials this admission: Unasyn 2/11 >>   Dose adjustments this admission: N/A  Microbiology results: 2/11 BCx: pending 2/11 RVP: pending 2/11 Sputum: pending  2/11 MRSA PCR: pending  Thank you for allowing pharmacy to be a part of this patient's care.  Benita Gutter 01/14/2023 8:24 AM

## 2023-01-14 NOTE — ED Notes (Signed)
Pt urinated on himself while sleeping. Pt given bed bath, linens changed, male purewick placed on pt.

## 2023-01-14 NOTE — Assessment & Plan Note (Signed)
Patient with acute onset thrombocytopenia with platelet at 54>>44.  No obvious bleeding.  LDH mildly elevated.  Smear was negative for any abnormal morphology. Platelets normal 31-monthago. Checking some more labs to find out the cause of acute thrombocytopenia. Hematology consult -Holding home Eliquis -Monitor platelet

## 2023-01-14 NOTE — ED Notes (Signed)
Pt was wet and wanted to use the restroom, this tech helped pt used restroom. Pt male pure wick was leaking, so this tech changed pt pure wick. Pt is back in bed and has no other needs at the moment.

## 2023-01-14 NOTE — ED Notes (Signed)
Marcia Rodas is pt sister and her number is [phone]  Peytin Taboada is pt brother and his number is [phone]  If pt would like to call either persons.

## 2023-01-14 NOTE — ED Notes (Addendum)
Went in Pt's room bc of low SPO2 on Nursing Station Monitor Pt declared that he would like to go to the "psych ward". When asked why, he said that he is thinking about doing something to himself (he had pulled his nasal canula off). I informed the charge nurse who instructed me to message the house coverage. Neomia Glass (house coverage) asked me to do a [place] Suicide Assessment on him which I did and he is a hight risk. Pt stated that he has no plan, but cannot do anything while he is in here. Pt is now a 1:1 and Neomia Glass is examining him now.

## 2023-01-14 NOTE — Assessment & Plan Note (Addendum)
Patient met sepsis criteria with tachycardia, tachypnea, imaging concerning for pneumonia.  Elevated lactic acid at 3.4>>2.9 and AKI.  Respiratory panel negative for COVID, influenza and RSV.  Patient was started on Unasyn for concern of aspiration most likely secondary to alcohol intoxication. 1 set of blood cultures with MRSE-most likely contaminant but vancomycin was also added.  Repeat blood cultures ordered but unfortunately he received a loading dose of vancomycin before that repeat. -Continue vancomycin -Continue Unasyn and Zithromax. -Follow-up blood cultures -Follow strep pneumo and Legionella antigen. -Follow-up sputum cultures

## 2023-01-14 NOTE — ED Provider Notes (Signed)
[facility] Provider Note    Event Date/Time   First MD Initiated Contact with Patient 01/14/23 0240     (approximate)   History   Seizures (Called out for complaints of pt fell out of chair and had seizure like activity. Pt states hx of seizures but has not taken seizure meds for "awhile". Chronic etoh abuse with etoh on board tonight. States drank 2 pints of liquor. Chronic o2 use)   HPI  Andrew Sparks is a 57 y.o. male who presents to the ED for evaluation of Seizures (Called out for complaints of pt fell out of chair and had seizure like activity. Pt states hx of seizures but has not taken seizure meds for "awhile". Chronic etoh abuse with etoh on board tonight. States drank 2 pints of liquor. Chronic o2 use)   I review 10/30/2022 medical DC summary.  History of paroxysmal A-fib on Xarelto, HTN, CAD, OSA, 3 L at baseline due to CHF.  Stroke and alcohol use disorder. HE was discharged with Keppra prescription.   Patient presents to the ED from home via EMS for evaluation of seizure versus syncope.  Sounds like he typically drinks at least 2 pints of liquor per day, and today he drank a similar amount.  He reports that he did not have a seizure, his right arm was just tremulous.  History is limited as he is obviously intoxicated.  He is asking to go home.   Physical Exam   Triage Vital Signs: ED Triage Vitals  Enc Vitals Group     BP      Pulse      Resp      Temp      Temp src      SpO2      Weight      Height      Head Circumference      Peak Flow      Pain Score      Pain Loc      Pain Edu?      Excl. in [place]?     Most recent vital signs: Vitals:   01/14/23 0630 01/14/23 0700  BP: (!) 129/96 (!) 133/95  Pulse: 96 (!) 101  Resp: 19 18  Temp:    SpO2: 93% 93%    General: Awake, no distress.  Clinically intoxicated CV:  Good peripheral perfusion.  Tachycardic and regular Resp:  Mild tachypnea to the low 20s.  Wet-sounding  respirations and cough. Abd:  No distention.  Soft and benign MSK:  No deformity noted.  No clear signs of trauma Neuro:  No seizure activity appreciated. Other:     ED Results / Procedures / Treatments   Labs (all labs ordered are listed, but only abnormal results are displayed) Labs Reviewed  COMPREHENSIVE METABOLIC PANEL - Abnormal; Notable for the following components:      Result Value   Chloride 94 (*)    BUN 26 (*)    Creatinine, Ser 1.29 (*)    Calcium 8.1 (*)    AST 50 (*)    Total Bilirubin 2.1 (*)    Anion gap 20 (*)    All other components within normal limits  CBC WITH DIFFERENTIAL/PLATELET - Abnormal; Notable for the following components:   RBC 3.87 (*)    MCV 103.9 (*)    RDW 17.6 (*)    Platelets 54 (*)    nRBC 0.8 (*)    Lymphs Abs 0.4 (*)  All other components within normal limits  MAGNESIUM - Abnormal; Notable for the following components:   Magnesium 1.6 (*)    All other components within normal limits  ETHANOL - Abnormal; Notable for the following components:   Alcohol, Ethyl (B) 419 (*)    All other components within normal limits  LACTIC ACID, PLASMA - Abnormal; Notable for the following components:   Lactic Acid, Venous 3.4 (*)    All other components within normal limits  LACTIC ACID, PLASMA - Abnormal; Notable for the following components:   Lactic Acid, Venous 2.9 (*)    All other components within normal limits  TROPONIN I (HIGH SENSITIVITY) - Abnormal; Notable for the following components:   Troponin I (High Sensitivity) 30 (*)    All other components within normal limits  TROPONIN I (HIGH SENSITIVITY) - Abnormal; Notable for the following components:   Troponin I (High Sensitivity) 42 (*)    All other components within normal limits  CULTURE, BLOOD (SINGLE)  BRAIN NATRIURETIC PEPTIDE  URINALYSIS, ROUTINE W REFLEX MICROSCOPIC  URINE DRUG SCREEN, QUALITATIVE ([facility] ONLY)    EKG Sinus tachycardia with rate 135 bpm.  Leftward axis,  right bundle.  No STEMI.  RADIOLOGY CT head interpreted by me without evidence of acute intracranial pathology CT chest without contrast interpreted by me with RLL and RML opacities  Official radiology report(s): CT Chest Wo Contrast  Result Date: 01/14/2023 CLINICAL DATA:  Shortness of breath with cough. EXAM: CT CHEST WITHOUT CONTRAST TECHNIQUE: Multidetector CT imaging of the chest was performed following the standard protocol without IV contrast. RADIATION DOSE REDUCTION: This exam was performed according to the departmental dose-optimization program which includes automated exposure control, adjustment of the mA and/or kV according to patient size and/or use of iterative reconstruction technique. COMPARISON:  05/12/2020 FINDINGS: Cardiovascular: The heart is markedly enlarged. Coronary artery calcification is evident. Mild atherosclerotic calcification is noted in the wall of the thoracic aorta. Enlargement of the pulmonary outflow tract/main pulmonary arteries suggests pulmonary arterial hypertension. Mediastinum/Nodes: Scattered upper normal mediastinal lymph nodes evident. No evidence for gross hilar lymphadenopathy although assessment is limited by the lack of intravenous contrast on the current study. The esophagus has normal imaging features. There is no axillary lymphadenopathy. Lungs/Pleura: Interlobular septal thickening noted in the upper lobes bilaterally. Fine detail of lung parenchyma in the lower chest bilaterally obscured by breathing motion during image acquisition. Within this limitation, there is consolidative airspace disease in the posterior right middle lobe and most prominently in the inferior right lower lobe. Similar but more modest airspace disease identified in the posterior left costophrenic sulcus. No pleural effusion. No suspicious pulmonary nodule or mass. Upper Abdomen: The liver shows diffusely decreased attenuation suggesting fat deposition. Tiny layering calcified  gallstones evident. Musculoskeletal: No worrisome lytic or sclerotic osseous abnormality. Mild superior endplate compression deformity noted at L2. IMPRESSION: 1. Consolidative airspace disease in the posterior right middle lobe and most prominently in the inferior right lower lobe. Similar but more modest airspace disease identified in the posterior left costophrenic sulcus. Imaging features are compatible with multifocal pneumonia. Aspiration could have this appearance in the appropriate clinical setting. Dependent edema considered a less likely possibility by imaging. 2. Marked cardiomegaly with interlobular septal thickening in the upper lobes bilaterally. 3. Enlargement of the pulmonary outflow tract/main pulmonary arteries compatible with pulmonary arterial hypertension. 4. Hepatic steatosis. 5. Cholelithiasis. 6.  Aortic Atherosclerosis (ICD10-I70.0). Electronically Signed   By: Misty Stanley M.D.   On: 01/14/2023 06:10  DG Chest Portable 1 View  Result Date: 01/14/2023 CLINICAL DATA:  seizure vs syncope. hypoxic CHF patient. eval infiltrate, signs of aspiration EXAM: PORTABLE CHEST 1 VIEW.  Patient is rotated COMPARISON:  Chest x-ray 10/29/2022 FINDINGS: The heart and mediastinal contours are unchanged. Slightly prominent left hilar vasculature which may be due to patient rotation. Right base and costophrenic angle collimated off view. No definite focal consolidation. Question slightly increased markings. No significant pleural effusion. No pneumothorax. No acute osseous abnormality. IMPRESSION: Limited evaluation due to patient rotation. Recommend repeat PA and lateral view of the chest. Electronically Signed   By: Iven Finn M.D.   On: 01/14/2023 03:40   CT HEAD WO CONTRAST (5MM)  Result Date: 01/14/2023 CLINICAL DATA:  fall, syncope vs seizure EXAM: CT HEAD WITHOUT CONTRAST TECHNIQUE: Contiguous axial images were obtained from the base of the skull through the vertex without intravenous  contrast. RADIATION DOSE REDUCTION: This exam was performed according to the departmental dose-optimization program which includes automated exposure control, adjustment of the mA and/or kV according to patient size and/or use of iterative reconstruction technique. COMPARISON:  CT head 10/29/2022, CT head 04/20/2020 FINDINGS: Brain: Cerebral ventricle sizes are concordant with the degree of cerebral volume loss. Similar-appearing right frontotemporal and right cerebellar encephalomalacia. Patchy and confluent areas of decreased attenuation are noted throughout the deep and periventricular white matter of the cerebral hemispheres bilaterally, compatible with chronic microvascular ischemic disease. No evidence of large-territorial acute infarction. No parenchymal hemorrhage. No mass lesion. No extra-axial collection. No mass effect or midline shift. No hydrocephalus. Basilar cisterns are patent. Vascular: No hyperdense vessel. Skull: No acute fracture or focal lesion. Sinuses/Orbits: Left maxillary sinus mucosal thickening. Otherwise paranasal sinuses and mastoid air cells are clear. The orbits are unremarkable. Other: None. IMPRESSION: No acute intracranial abnormality. Electronically Signed   By: Iven Finn M.D.   On: 01/14/2023 03:39    PROCEDURES and INTERVENTIONS:  .1-3 Lead EKG Interpretation  Performed by: Vladimir Crofts, MD Authorized by: Vladimir Crofts, MD     Interpretation: abnormal     ECG rate:  106   ECG rate assessment: tachycardic     Rhythm: sinus tachycardia     Ectopy: none     Conduction: normal   .Critical Care  Performed by: Vladimir Crofts, MD Authorized by: Vladimir Crofts, MD   Critical care provider statement:    Critical care time (minutes):  30   Critical care time was exclusive of:  Separately billable procedures and treating other patients   Critical care was necessary to treat or prevent imminent or life-threatening deterioration of the following conditions:  Sepsis    Critical care was time spent personally by me on the following activities:  Development of treatment plan with patient or surrogate, discussions with consultants, evaluation of patient's response to treatment, examination of patient, ordering and review of laboratory studies, ordering and review of radiographic studies, ordering and performing treatments and interventions, pulse oximetry, re-evaluation of patient's condition and review of old charts   Medications  LORazepam (ATIVAN) injection 0-4 mg (has no administration in time range)    Or  LORazepam (ATIVAN) tablet 0-4 mg (has no administration in time range)  LORazepam (ATIVAN) injection 0-4 mg (has no administration in time range)    Or  LORazepam (ATIVAN) tablet 0-4 mg (has no administration in time range)  thiamine (VITAMIN B1) tablet 100 mg (has no administration in time range)    Or  thiamine (VITAMIN B1) injection 100 mg (has no  administration in time range)  lactated ringers infusion (has no administration in time range)  levETIRAcetam (KEPPRA) IVPB 1500 mg/ 100 mL premix (0 mg Intravenous Stopped 01/14/23 0552)  dextrose 5 % in lactated ringers infusion (1,000 mLs Intravenous New Bag/Given 01/14/23 0553)  Ampicillin-Sulbactam (UNASYN) 3 g in sodium chloride 0.9 % 100 mL IVPB (3 g Intravenous New Bag/Given 01/14/23 0642)     IMPRESSION / MDM / ASSESSMENT AND PLAN / ED COURSE  I reviewed the triage vital signs and the nursing notes.  Differential diagnosis includes, but is not limited to, seizure, cardiogenic syncope, sepsis, status ellipticus, alcohol withdrawals  {Patient presents with symptoms of an acute illness or injury that is potentially life-threatening.  57 year old alcoholic presents after a witnessed seizure, with evidence of sepsis from aspiration pneumonia requiring medical admission.  No seizure activity noted here in the ED and he was loaded with Keppra IV.  No evidence of acute neurologic deficits from his baseline  from previous strokes.  He does have an increased O2 requirement from his typical 3 L, though much of this does seem to be when he sleeps and I suspect OSA and mouth breathing in addition to his underlying pulmonary disease.  His CXR is of a poor quality and I am clinically concerned about aspiration versus pneumonia versus pleural effusions and so CT obtained and confirms consolidative disease concerning for aspiration.  He is meeting sepsis criteria with his vital sign derangements, blood cultures drawn and he was provided Unasyn.  He is quite intoxicated with ethanol level over 400.  Lactic acidosis is resolving, uncertain if this is related to a seizure or severe sepsis.  We will place him on CIWA protocol and consult medicine for admission.  Clinical Course as of 01/14/23 [id number]  Nancy Fetter Jan 14, 2023  0313 reassessed [DS]  0340 Patient now refusing nursing staff to use ultrasound to place IV and draw blood work.  He is requesting to go home. [DS]  0400 Nursing able to convince him to allow for IV and blood draw [DS]  0517 reassessed [DS]  0528 I talk to girlfriend on the phone. Recurrent seizures. Doesn't take his medicine. 5-10 minutes per seizure, often generalized. He was on the couch when she went to bed, but she found him on the floor. Both feet, and now hands, have been swelling - his breathing sounds wet, concerned about fluid [DS]    Clinical Course User Index [DS] Vladimir Crofts, MD     FINAL CLINICAL IMPRESSION(S) / ED DIAGNOSES   Final diagnoses:  Sepsis with acute hypoxic respiratory failure without septic shock, due to unspecified organism ([place])  Seizure-like activity ([place])  Alcoholic intoxication with complication ([place])     Rx / DC Orders   ED Discharge Orders     None        Note:  This document was prepared using Dragon voice recognition software and may include unintentional dictation errors.   Vladimir Crofts, MD 01/14/23 [phone]

## 2023-01-14 NOTE — ED Notes (Signed)
This RN spoke to pts girlfriend to give update on plan of care.

## 2023-01-14 NOTE — ED Notes (Signed)
IVC/pending medical admission ?

## 2023-01-14 NOTE — H&P (Signed)
History and Physical    Patient: Andrew Sparks [id number] DOB: 1966/05/19 DOA: 01/14/2023 DOS: the patient was seen and examined on 01/14/2023 PCP: Jodi Marble, MD  Patient coming from: Home  Chief Complaint:  Chief Complaint  Patient presents with   Seizures    Called out for complaints of pt fell out of chair and had seizure like activity. Pt states hx of seizures but has not taken seizure meds for "awhile". Chronic etoh abuse with etoh on board tonight. States drank 2 pints of liquor. Chronic o2 use   HPI: Andrew Sparks is a 57 y.o. male with medical history significant of paroxysmal atrial fibrillation, chronic HFpEF, hypertension, alcohol abuse, seizure disorder and depression was brought to ED via EMS when he fell out of chair and had some seizure-like activity, no incontinence or tongue bite, he drank 2 pints of liquor before that.  Patient has stopped taking most of his home medications which includes Keppra, stating that he does not have any money.  Patient denies any head injury.  No loss of consciousness.  Denies any recent illnesses which include chest pain, upper respiratory symptoms, fever or chills. No recent change in appetite, weight or bowel habits.  No fever or chills.  He was experiencing bilateral lower extremity pain for some time.  ED course.  On arrival patient was in A-fib with RVR, tachypneic and hypoxic in mid 80s requiring up to 6 L of oxygen.  Labs pertinent for platelet count of 54, BUN 26, creatinine 1.29, total bilirubin 2.1 and AST of 50, BNP 227, lactic acid 3.4>>2.9.  Respiratory panel negative for COVID, RSV and influenza.  Ethanol markedly elevated at 419. Chest x-ray with limited information so CT chest without contrast was obtained which shows consolidative airspace disease in the posterior right middle lobe, inferior right lower lobe and posterior left costophrenic sulcus.  Features compatible with multifocal pneumonia and  concern of aspiration.  Also concern of pulmonary hypertension, hepatic steatosis and cholelithiasis along with aortic atherosclerosis.  Patient received Keppra load and started on Unasyn for concern of aspiration pneumonia.  Also started on CIWA protocol for concern of alcohol abuse.  TRH was consulted for admission.  Review of Systems: As mentioned in the history of present illness. All other systems reviewed and are negative. Past Medical History:  Diagnosis Date   A-fib ([place])    Anxiety    CHF (congestive heart failure) (HCC)    Depression    Hypersomnia with sleep apnea    Hypertension    Morbid obesity ([place])    NSTEMI (non-ST elevated myocardial infarction) ([place]) 2005   Obesity hypoventilation syndrome (HCC)    Shortness of breath dyspnea    Sleep apnea    Stroke ([place])    V-tach [facility])    Past Surgical History:  Procedure Laterality Date   LEFT HEART CATH AND CORONARY ANGIOGRAPHY Right 09/10/2018   Procedure: LEFT HEART CATH AND CORONARY ANGIOGRAPHY with possible percutaneous intervention;  Surgeon: Dionisio David, MD;  Location: [facility];  Service: Cardiovascular;  Laterality: Right;   TRACHEOSTOMY     Social History:  reports that he has been smoking cigarettes. He has a 7.00 pack-year smoking history. He has never used smokeless tobacco. He reports current alcohol use of about 4.0 standard drinks of alcohol per week. He reports current drug use. Drug: Marijuana.  No Known Allergies  Family History  Problem Relation Age of Onset   Heart disease Father  Diabetes type II Mother    Breast cancer Mother     Prior to Admission medications   Medication Sig Start Date End Date Taking? Authorizing Provider  albuterol (VENTOLIN HFA) 108 (90 Base) MCG/ACT inhaler Inhale 1-2 puffs into the lungs as directed. 09/04/22   [provider]  amiodarone (PACERONE) 200 MG tablet Take 1 tablet (200 mg total) by mouth daily. 07/06/22 10/29/22  Max Sane, MD   amLODipine (NORVASC) 5 MG tablet Take 1 tablet (5 mg total) by mouth daily. 07/06/22 08/05/22  Max Sane, MD  apixaban (ELIQUIS) 5 MG TABS tablet Take 1 tablet (5 mg total) by mouth 2 (two) times daily. 07/06/22 08/05/22  Max Sane, MD  buPROPion (WELLBUTRIN XL) 150 MG 24 hr tablet Take 1 tablet (150 mg total) by mouth daily. 07/06/22 08/05/22  Max Sane, MD  chlorthalidone (HYGROTON) 25 MG tablet Take 1 tablet (25 mg total) by mouth daily. 07/06/22 08/05/22  Max Sane, MD  furosemide (LASIX) 40 MG tablet Take 1 tablet (40 mg total) by mouth daily. 07/06/22 08/05/22  Max Sane, MD  gabapentin (NEURONTIN) 400 MG capsule Take 400 mg by mouth 3 (three) times daily. 10/24/22   [provider]  hydrALAZINE (APRESOLINE) 100 MG tablet Take 1 tablet (100 mg total) by mouth 3 (three) times daily. 07/06/22 08/05/22  Max Sane, MD  levETIRAcetam (KEPPRA) 500 MG tablet Take 1 tablet (500 mg total) by mouth 2 (two) times daily. 10/30/22   Jennye Boroughs, MD  lisinopril (ZESTRIL) 40 MG tablet Take 40 mg by mouth daily. 10/24/22   [provider]  metoprolol tartrate (LOPRESSOR) 50 MG tablet Take 1 tablet (50 mg total) by mouth 2 (two) times daily. 07/06/22 10/29/22  Max Sane, MD  QUEtiapine (SEROQUEL) 100 MG tablet Take 1 tablet (100 mg total) by mouth at bedtime. 07/06/22 08/05/22  Max Sane, MD  rosuvastatin (CRESTOR) 20 MG tablet Take 1 tablet (20 mg total) by mouth at bedtime. 07/06/22 10/29/22  Max Sane, MD  valsartan (DIOVAN) 320 MG tablet Take 1 tablet (320 mg total) by mouth every morning. 07/06/22 08/05/22  Max Sane, MD  venlafaxine XR (EFFEXOR-XR) 75 MG 24 hr capsule Take 1 capsule (75 mg total) by mouth daily with breakfast. 07/06/22 08/05/22  Max Sane, MD    Physical Exam: Vitals:   01/14/23 1154 01/14/23 1200 01/14/23 1230 01/14/23 1345  BP:  (!) 134/104 (!) 132/107 (!) 133/92  Pulse: (!) 124 (!) 127 (!) 120 (!) 117  Resp: 20 18    Temp:  97.6 F (36.4 C)    TempSrc:      SpO2: 100% 99%  97%   Weight:      Height:        General: Vital signs reviewed.  Patient is well-developed and well-nourished, in no acute distress and cooperative with exam.  Head: Normocephalic and atraumatic. Eyes: EOMI, conjunctivae normal, no scleral icterus.  Neck: Supple, trachea midline, normal ROM,  Cardiovascular: Irregularly irregular Pulmonary/Chest: Few scattered rhonchi Abdominal: Soft, non-tender, non-distended, BS +, Extremities: 1+ lower extremity edema bilaterally,  pulses symmetric and intact bilaterally.  Neurological: A&O x3, Strength is normal and symmetric bilaterally, cranial nerve II-XII are grossly intact, no focal motor deficit, sensory intact to light touch bilaterally.  Psychiatric: Normal mood and affect.  Data Reviewed: Prior data reviewed, as mentioned above.  Assessment and Plan: * Acute on chronic respiratory failure with hypoxia ([place]) Most likely secondary to aspiration pneumonia.  Uses 3 to 4 L at baseline, currently  on 6 L of oxygen.  He was hypoxic up to 86% on baseline in the ED.  Sepsis due to pneumonia [facility]) Patient met sepsis criteria with tachycardia, tachypnea, imaging concerning for pneumonia.  Elevated lactic acid at 3.4>>2.9 and AKI.  Respiratory panel negative for COVID, influenza and RSV.  Patient was started on Unasyn for concern of aspiration most likely secondary to alcohol intoxication. -Admit to medical telemetry -Unasyn and Zithromax -Continue trending lactic acid. -Follow-up blood cultures -Follow strep pneumo and Legionella antigen. -Follow-up sputum cultures   Seizure (HCC) Concern of seizure at home by girlfriend, no incontinence or tongue bite.  No significant postictal state.  Patient has an history of seizure disorder and stopped taking his Keppra for about a month.  No seizure-like activity noted in ED Received loading dose of Keppra in ED. -Continue with home dose of Keppra  Thrombocytopenia ([place]) Patient with acute onset  thrombocytopenia with platelet at 54.  No obvious bleeding. Platelets normal 2-monthago. -Check smear -Holding home Eliquis -Monitor platelet -Check respiratory viral panel to rule out any viral cause for thrombocytopenia.  Paroxysmal atrial fibrillation (HCC) EKG with A-fib and RVR.  Patient was not very compliant with his home amiodarone and metoprolol. -Restart home amiodarone and metoprolol -Holding home Eliquis due to thrombocytopenia  Chronic diastolic CHF (congestive heart failure) ([place] Echo done in November 2023 with normal EF and grade 1 diastolic dysfunction. BNP of 227.  1+ lower extremity edema. -Avoid IV fluid. -Holding home Lasix for concern of sepsis -Low threshold to start Lasix if worsening respiratory status or developed pulmonary edema  Alcohol abuse Markedly elevated ethanol levels on admission.  Patient uses liquor regularly. Counseling was provided. -CIWA protocol with Ativan  Essential hypertension Patient was on multiple antihypertensives at home which he was not taking it for the past couple of weeks.  Blood pressure variable. -Holding home antihypertensives for the concern of sepsis. -Will start amlodipine first if needed, holding ACE inhibitor due to AKI  Major depressive disorder, recurrent severe without psychotic features ([place] Not taking his home Seroquel and Effexor.   AKI (acute kidney injury) ([place] Most likely secondary to sepsis.  Creatinine at 1.29 with baseline around 1. -Monitor renal function -Avoid nephrotoxin  Tobacco abuse Counseling was provided. -Nicotine patch  Dyslipidemia -Continue statin    Advance Care Planning:   Code Status: Full Code discussed with patient.  Consults: None  Family Communication: No family member at bedside  Severity of Illness: The appropriate patient status for this patient is INPATIENT. Inpatient status is judged to be reasonable and necessary in order to provide the required intensity of  service to ensure the patient's safety. The patient's presenting symptoms, physical exam findings, and initial radiographic and laboratory data in the context of their chronic comorbidities is felt to place them at high risk for further clinical deterioration. Furthermore, it is not anticipated that the patient will be medically stable for discharge from the hospital within 2 midnights of admission.   * I certify that at the point of admission it is my clinical judgment that the patient will require inpatient hospital care spanning beyond 2 midnights from the point of admission due to high intensity of service, high risk for further deterioration and high frequency of surveillance required.*  This record has been created using [employer] analyst Errors have been sought and corrected,but may not always be located. Such creation errors do not reflect on the standard of care.   Author: SLorella Nimrod MD  01/14/2023 2:13 PM  For on call review [email or web address].

## 2023-01-14 NOTE — Assessment & Plan Note (Signed)
Patient was on multiple antihypertensives at home which he was not taking it for the past couple of weeks.  Blood pressure variable. -Holding home antihypertensives for the concern of sepsis. -Will start amlodipine first if needed, holding ACE inhibitor due to AKI

## 2023-01-14 NOTE — Progress Notes (Signed)
       CROSS COVER NOTE  NAME: Andrew Sparks MRN: [id number] DOB : 1966-07-20 ATTENDING PHYSICIAN: Lorella Nimrod, MD    Date of Service   01/14/2023   HPI/Events of Note   Suicidal ideation, no plan Wants to leave the hospital  fair insight and judgement  Hx of same with geropsych admission in 06/2022  Patient states the only reason he stated he wanted to harm himself is so that he can leave the hospital. Unclear if he is saying this now that he was told he may be IVC'd after my assessment.  Interventions   Assessment/Plan:  IVC Suicide Precautions 1:1 S[place] Consult psych***    *** professional thanks      To reach the provider On-Call:   7AM- 7PM see care teams to locate the attending and reach out to them via [email or web address] Password: [email or web address] 7PM-7AM contact night-coverage If you still have difficulty reaching the appropriate provider, please page the [facility] (Director on Call) for [facility] on amion for assistance  This document was prepared using Systems analyst and may include unintentional dictation errors.  Neomia Glass DNP, MBA, FNP-BC, PMHNP-BC Nurse Practitioner [facility] Pager [phone]

## 2023-01-14 NOTE — Assessment & Plan Note (Signed)
Resolved with hydration Most likely secondary to sepsis.   -Monitor renal function -Avoid nephrotoxin

## 2023-01-14 NOTE — ED Triage Notes (Signed)
Called out for complaints of pt fell out of chair and had seizure like activity. Pt states hx of seizures but has not taken seizure meds for "awhile". Chronic etoh abuse with etoh on board tonight. States drank 2 pints of liquor. Chronic o2 use

## 2023-01-14 NOTE — Progress Notes (Signed)
PT Cancellation Note  Patient Details Name: Andrew Sparks MRN: [id number] DOB: Mar 12, 1966   Cancelled Treatment:    Reason Eval/Treat Not Completed: Fatigue/lethargy limiting ability to participate;Patient not medically ready. RN states patient is unable to participate in PT assessment at this time due to LOC. Will re-attempt tomorrow.    Wyatt Thorstenson 01/14/2023, 1:57 PM

## 2023-01-14 NOTE — Assessment & Plan Note (Signed)
Markedly elevated ethanol levels on admission.  Patient uses liquor regularly. Counseling was provided. -CIWA protocol with Ativan

## 2023-01-14 NOTE — Assessment & Plan Note (Signed)
Patient exhibits some suicidal thoughts overnight so he was IVC'ed. Denies any suicidal thoughts this morning. Psych consult for further evaluation Not taking his home Seroquel and Effexor.

## 2023-01-14 NOTE — Assessment & Plan Note (Signed)
EKG with A-fib and RVR.  Patient was not very compliant with his home amiodarone and metoprolol. -Restart home amiodarone and metoprolol -Holding home Eliquis due to thrombocytopenia

## 2023-01-14 NOTE — Assessment & Plan Note (Signed)
Concern of seizure at home by girlfriend, no incontinence or tongue bite.  No significant postictal state.  Patient has an history of seizure disorder and stopped taking his Keppra for about a month.  No seizure-like activity noted in ED Received loading dose of Keppra in ED. -Continue with home dose of Keppra

## 2023-01-14 NOTE — Assessment & Plan Note (Signed)
Counseling was provided. - Nicotine patch 

## 2023-01-14 NOTE — Assessment & Plan Note (Signed)
Most likely secondary to aspiration pneumonia.  Uses 3 to 4 L at baseline, currently on 6 L of oxygen.  He was hypoxic up to 86% on baseline in the ED. -Try weaning back to baseline

## 2023-01-14 NOTE — Assessment & Plan Note (Addendum)
Echo done in November 2023 with normal EF and grade 1 diastolic dysfunction. BNP of 227.  1+ lower extremity edema. -Giving 1 dose of IV Lasix -Avoid IV fluid. -Continue to monitor

## 2023-01-14 NOTE — ED Notes (Signed)
Amin MD made aware of pts HR/rhythm. EKG updated.

## 2023-01-14 NOTE — Progress Notes (Signed)
CODE SEPSIS - PHARMACY COMMUNICATION  **Broad Spectrum Antibiotics should be administered within 1 hour of Sepsis diagnosis**  Time Code Sepsis Called/Page Received: 0710  Antibiotics Ordered: Unasyn  Time of 1st antibiotic administration: [phone] (prior to code sepsis activation)  Additional action taken by pharmacy: Discussed with EDP if plan to give broad spectrum antibiotics for code sepsis activation after Unasyn administration. Felt that Unasyn adequate coverage at this time.  Benita Gutter 01/14/2023  7:19 AM

## 2023-01-14 NOTE — Progress Notes (Addendum)
Elink following for sepsis protocol. Per day shift RN, night staff reported  antibiotic given after blood cx drawn, but unable to edit in absence of prior RN.0

## 2023-01-14 NOTE — Assessment & Plan Note (Signed)
Continue statin. 

## 2023-01-15 DIAGNOSIS — D696 Thrombocytopenia, unspecified: Secondary | ICD-10-CM | POA: Diagnosis not present

## 2023-01-15 DIAGNOSIS — R569 Unspecified convulsions: Secondary | ICD-10-CM | POA: Diagnosis not present

## 2023-01-15 DIAGNOSIS — A419 Sepsis, unspecified organism: Secondary | ICD-10-CM | POA: Diagnosis not present

## 2023-01-15 DIAGNOSIS — J9621 Acute and chronic respiratory failure with hypoxia: Secondary | ICD-10-CM | POA: Diagnosis not present

## 2023-01-15 DIAGNOSIS — R45851 Suicidal ideations: Secondary | ICD-10-CM

## 2023-01-15 LAB — BLOOD CULTURE ID PANEL (REFLEXED) - BCID2

## 2023-01-15 LAB — FOLATE: Folate: 5.2 ng/mL — ABNORMAL LOW (ref >5.9)

## 2023-01-15 LAB — RESPIRATORY PANEL BY PCR

## 2023-01-15 LAB — RAPID HIV SCREEN (HIV 1/2 AB+AG)
HIV 1/2 Antibodies: NONREACTIVE
HIV-1 P24 Antigen - HIV24: NONREACTIVE

## 2023-01-15 LAB — CBC
HCT: 33.8 % — ABNORMAL LOW (ref 39.0–52.0)
Hemoglobin: 11 g/dL — ABNORMAL LOW (ref 13.0–17.0)
MCH: 34 pg (ref 26.0–34.0)
MCHC: 32.5 g/dL (ref 30.0–36.0)
MCV: 104.3 fL — ABNORMAL HIGH (ref 80.0–100.0)
Platelets: 44 10*3/uL — ABNORMAL LOW (ref 150–400)
RBC: 3.24 MIL/uL — ABNORMAL LOW (ref 4.22–5.81)
RDW: 17.2 % — ABNORMAL HIGH (ref 11.5–15.5)
WBC: 5.1 10*3/uL (ref 4.0–10.5)
nRBC: 1.8 % — ABNORMAL HIGH (ref 0.0–0.2)

## 2023-01-15 LAB — STREP PNEUMONIAE URINARY ANTIGEN: Strep Pneumo Urinary Antigen: NEGATIVE

## 2023-01-15 LAB — COMPREHENSIVE METABOLIC PANEL
ALT: 17 U/L (ref 0–44)
AST: 36 U/L (ref 15–41)
Albumin: 3.1 g/dL — ABNORMAL LOW (ref 3.5–5.0)
Alkaline Phosphatase: 66 U/L (ref 38–126)
Anion gap: 10 (ref 5–15)
BUN: 20 mg/dL (ref 6–20)
CO2: 30 mmol/L (ref 22–32)
Calcium: 7.9 mg/dL — ABNORMAL LOW (ref 8.9–10.3)
Chloride: 96 mmol/L — ABNORMAL LOW (ref 98–111)
Creatinine, Ser: 0.95 mg/dL (ref 0.61–1.24)
GFR, Estimated: >60 mL/min (ref >60)
Glucose, Bld: 85 mg/dL (ref 70–99)
Potassium: 3.7 mmol/L (ref 3.5–5.1)
Sodium: 136 mmol/L (ref 135–145)
Total Bilirubin: 1.6 mg/dL — ABNORMAL HIGH (ref 0.3–1.2)
Total Protein: 7 g/dL (ref 6.5–8.1)

## 2023-01-15 LAB — CBG MONITORING, ED: Glucose-Capillary: 82 mg/dL (ref 70–99)

## 2023-01-15 LAB — HEPATITIS C ANTIBODY: HCV Ab: NONREACTIVE

## 2023-01-15 LAB — IRON AND TIBC
Iron: 80 ug/dL (ref 45–182)
Saturation Ratios: 34 % (ref 17.9–39.5)
TIBC: 237 ug/dL — ABNORMAL LOW (ref 250–450)
UIBC: 157 ug/dL

## 2023-01-15 LAB — FERRITIN: Ferritin: 730 ng/mL — ABNORMAL HIGH (ref 24–336)

## 2023-01-15 LAB — LACTATE DEHYDROGENASE: LDH: 243 U/L — ABNORMAL HIGH (ref 98–192)

## 2023-01-15 LAB — PROCALCITONIN: Procalcitonin: 0.11 ng/mL

## 2023-01-15 LAB — D-DIMER, QUANTITATIVE: D-Dimer, Quant: 1.71 ug/mL-FEU — ABNORMAL HIGH (ref 0.00–0.50)

## 2023-01-15 LAB — FIBRINOGEN: Fibrinogen: 514 mg/dL — ABNORMAL HIGH (ref 210–475)

## 2023-01-15 LAB — APTT: aPTT: 32 seconds (ref 24–36)

## 2023-01-15 MED ORDER — MELATONIN 5 MG PO TABS
5.0000 mg | ORAL_TABLET | Freq: Once | ORAL | Status: AC
  Administered 2023-01-15: 5 mg via ORAL
  Filled 2023-01-15: qty 1

## 2023-01-15 MED ORDER — MAGNESIUM SULFATE 2 GM/50ML IV SOLN
2.0000 g | Freq: Once | INTRAVENOUS | Status: AC
  Administered 2023-01-15: 2 g via INTRAVENOUS
  Filled 2023-01-15: qty 50

## 2023-01-15 MED ORDER — LORAZEPAM 2 MG/ML IJ SOLN
1.0000 mg | Freq: Once | INTRAMUSCULAR | Status: AC
  Administered 2023-01-15: 1 mg via INTRAVENOUS
  Filled 2023-01-15: qty 1

## 2023-01-15 MED ORDER — SODIUM CHLORIDE 0.9 % IV SOLN
3.0000 g | Freq: Four times a day (QID) | INTRAVENOUS | Status: AC
  Administered 2023-01-15 – 2023-01-19 (×18): 3 g via INTRAVENOUS
  Filled 2023-01-15 (×5): qty 8
  Filled 2023-01-15: qty 3
  Filled 2023-01-15 (×3): qty 8
  Filled 2023-01-15: qty 3
  Filled 2023-01-15 (×5): qty 8
  Filled 2023-01-15: qty 3
  Filled 2023-01-15 (×2): qty 8

## 2023-01-15 MED ORDER — AMLODIPINE BESYLATE 5 MG PO TABS
5.0000 mg | ORAL_TABLET | Freq: Every day | ORAL | Status: DC
  Administered 2023-01-15 – 2023-01-22 (×7): 5 mg via ORAL
  Filled 2023-01-15 (×8): qty 1

## 2023-01-15 MED ORDER — VANCOMYCIN HCL 2000 MG/400ML IV SOLN
2000.0000 mg | Freq: Once | INTRAVENOUS | Status: AC
  Administered 2023-01-15: 2000 mg via INTRAVENOUS
  Filled 2023-01-15: qty 400

## 2023-01-15 MED ORDER — VANCOMYCIN HCL 1250 MG/250ML IV SOLN
1250.0000 mg | Freq: Two times a day (BID) | INTRAVENOUS | Status: DC
  Administered 2023-01-15 – 2023-01-17 (×4): 1250 mg via INTRAVENOUS
  Filled 2023-01-15 (×4): qty 250

## 2023-01-15 MED ORDER — FUROSEMIDE 10 MG/ML IJ SOLN
40.0000 mg | Freq: Once | INTRAMUSCULAR | Status: AC
  Administered 2023-01-15: 40 mg via INTRAVENOUS
  Filled 2023-01-15: qty 4

## 2023-01-15 MED ORDER — GABAPENTIN 400 MG PO CAPS
400.0000 mg | ORAL_CAPSULE | Freq: Three times a day (TID) | ORAL | Status: DC
  Administered 2023-01-15 – 2023-01-24 (×25): 400 mg via ORAL
  Filled 2023-01-15 (×29): qty 1

## 2023-01-15 MED ORDER — METOPROLOL TARTRATE 50 MG PO TABS
100.0000 mg | ORAL_TABLET | Freq: Two times a day (BID) | ORAL | Status: DC
  Administered 2023-01-15 – 2023-01-24 (×18): 100 mg via ORAL
  Filled 2023-01-15 (×19): qty 2

## 2023-01-15 NOTE — Progress Notes (Signed)
       CROSS COVER NOTE  NAME: Andrew Sparks MRN: [id number] DOB : 1966/11/03 ATTENDING PHYSICIAN: Lorella Nimrod, MD    Date of Service   01/15/2023   HPI/Events of Note   Medication request received for patient of having an anxiety attack and request for sleep aid.  Interventions   Assessment/Plan:  Ativan Melatonin      To reach the provider On-Call:   7AM- 7PM see care teams to locate the attending and reach out to them via [email or web address] Password: [email or web address] 7PM-7AM contact night-coverage If you still have difficulty reaching the appropriate provider, please page the [facility] (Director on Call) for [facility] on amion for assistance  This document was prepared using Systems analyst and may include unintentional dictation errors.  Neomia Glass DNP, MBA, FNP-BC, PMHNP-BC Nurse Practitioner [facility] Pager [phone]

## 2023-01-15 NOTE — ED Notes (Signed)
Pt was found with his head at the foot of the bed, one foot at the head of the bed, and one foot through the side bed rail. Pt had removed his cardiac leads, male purewick, oxygen and bp cuff. Pt was repositioned in the bed, chuck pads changed, oxygen reapplied, new male purewick placed. MD notified.

## 2023-01-15 NOTE — ED Notes (Addendum)
Pt o2 stats between 88-92. NT [facility]e applied new pulse/ox reader to pt finger. o2 stats continue to fluctuate between 88-92. Nurse is aware.

## 2023-01-15 NOTE — ED Notes (Signed)
Moved to rm 24

## 2023-01-15 NOTE — ED Notes (Addendum)
Pt has requested the [facility] NP to return to his room. This RN has made the request and pt was notified.

## 2023-01-15 NOTE — ED Notes (Signed)
Pt assisted to toilet however, could not void. Pt back in bed at this time.

## 2023-01-15 NOTE — ED Notes (Signed)
[place] NP at bedside

## 2023-01-15 NOTE — Evaluation (Signed)
Physical Therapy Evaluation Patient Details Name: Taha Waldbillig MRN: [id number] DOB: 11-01-1966 Today's Date: 01/15/2023  History of Present Illness  Patient is a 57 year old male with concern for seizure at home. Acute on chronic respiratory failure with hypoxia, sepsis due to pneumonia. Medical history significant of paroxysmal atrial fibrillation, chronic HFpEF, hypertension, alcohol abuse, seizure disorder and depression.  Clinical Impression  Patient is agreeable to PT. He reports feeling fatigued, has gotten little sleep, and feeling anxiety this morning. He reports having generalized weakness and did not want to try standing today. He requires only supervision for bed mobility with fair sitting balance. Heart rate up to 132 with just sitting and Sp02 remained in the 90's on 6 L02. Recommend to continue PT to maximize independence and decrease caregiver burden. Anticipate patient could return home with HHPT and family support.      Recommendations for follow up therapy are one component of a multi-disciplinary discharge planning process, led by the attending physician.  Recommendations may be updated based on patient status, additional functional criteria and insurance authorization.  Follow Up Recommendations Home health PT      Assistance Recommended at Discharge Intermittent Supervision/Assistance  Patient can return home with the following  A little help with walking and/or transfers;A little help with bathing/dressing/bathroom;Assist for transportation;Help with stairs or ramp for entrance    Equipment Recommendations None recommended by PT  Recommendations for Other Services       Functional Status Assessment Patient has had a recent decline in their functional status and demonstrates the ability to make significant improvements in function in a reasonable and predictable amount of time.     Precautions / Restrictions Precautions Precautions:  Fall Restrictions Weight Bearing Restrictions: No      Mobility  Bed Mobility Overal bed mobility: Needs Assistance Bed Mobility: Supine to Sit, Sit to Supine     Supine to sit: Supervision Sit to supine: Supervision   General bed mobility comments: increased time and effort with no physical assistance required    Transfers                   General transfer comment: patient able to complete lateral scoot transfer to the right with supervision. he declined standing due to anxiety and fatigue. heart rate up to 132 bpm with sitting upright.    Ambulation/Gait                  Stairs            Wheelchair Mobility    Modified Rankin (Stroke Patients Only)       Balance Overall balance assessment: Needs assistance Sitting-balance support: Feet unsupported, No upper extremity supported Sitting balance-Leahy Scale: Fair                                       Pertinent Vitals/Pain Pain Assessment Pain Assessment: No/denies pain    Home Living Family/patient expects to be discharged to:: Private residence Living Arrangements: Spouse/significant other Available Help at Discharge: Friend(s);Available PRN/intermittently Type of Home: Apartment Home Access: Stairs to enter   Entrance Stairs-Number of Steps: 1   Home Layout: One level Home Equipment: Cane - single point      Prior Function Prior Level of Function : Needs assist             Mobility Comments: short distance ambulation on 3-4 L02 with  his cane. ADLs Comments: has assistance if needed which is required intermittently depending on fatigue level     Hand Dominance        Extremity/Trunk Assessment   Upper Extremity Assessment Upper Extremity Assessment: Generalized weakness    Lower Extremity Assessment Lower Extremity Assessment: Generalized weakness       Communication   Communication: No difficulties  Cognition Arousal/Alertness:  Awake/alert Behavior During Therapy: Anxious Overall Cognitive Status: Within Functional Limits for tasks assessed                                          General Comments General comments (skin integrity, edema, etc.): patient complains of not getting any sleep, feeling like he may have an anxiety attack, and feeling generalized weakness. patient on 6L02 during session with Sp02 in the 90's and MD present during session and turned down to 5 L02.    Exercises     Assessment/Plan    PT Assessment Patient needs continued PT services  PT Problem List Decreased strength;Decreased range of motion;Decreased activity tolerance;Decreased balance;Decreased mobility       PT Treatment Interventions DME instruction;Stair training;Gait training;Functional mobility training;Therapeutic activities;Therapeutic exercise;Balance training;Neuromuscular re-education;Patient/family education    PT Goals (Current goals can be found in the Care Plan section)  Acute Rehab PT Goals Patient Stated Goal: to go home PT Goal Formulation: With patient Time For Goal Achievement: 01/29/23 Potential to Achieve Goals: Fair    Frequency Min 2X/week     Co-evaluation               AM-PAC PT "6 Clicks" Mobility  Outcome Measure Help needed turning from your back to your side while in a flat bed without using bedrails?: None Help needed moving from lying on your back to sitting on the side of a flat bed without using bedrails?: A Little Help needed moving to and from a bed to a chair (including a wheelchair)?: A Little Help needed standing up from a chair using your arms (e.g., wheelchair or bedside chair)?: A Little Help needed to walk in hospital room?: A Little Help needed climbing 3-5 steps with a railing? : A Lot 6 Click Score: 18    End of Session Equipment Utilized During Treatment: Oxygen Activity Tolerance: Patient limited by fatigue Patient left: in bed;with call  bell/phone within reach Nurse Communication: Mobility status PT Visit Diagnosis: Unsteadiness on feet (R26.81);Muscle weakness (generalized) (M62.81)    Time: [id number] PT Time Calculation (min) (ACUTE ONLY): 20 min   Charges:   PT Evaluation $PT Eval Low Complexity: 1 Low          [name], PT, MPT   [name] 01/15/2023, 11:58 AM

## 2023-01-15 NOTE — ED Notes (Signed)
Pt wanted to get up to the toilet, this writer assisted pt with walker. Pt now back in bed.

## 2023-01-15 NOTE — ED Notes (Signed)
Patient belongings:  Andrew Sparks shirt  Andrew Sparks pants 2 white socks

## 2023-01-15 NOTE — ED Notes (Signed)
IVC  PT  WAITING ON  MEDICAL  BED

## 2023-01-15 NOTE — ED Notes (Signed)
Lab at bedside collecting blood cultures.

## 2023-01-15 NOTE — ED Notes (Signed)
Attempted for blood cultures with no success. Lab called at this time.

## 2023-01-15 NOTE — Progress Notes (Signed)
Pharmacy Antibiotic Note  Andrew Sparks is a 57 y.o. male admitted on 01/14/2023 with bacteremia.  Pharmacy has been consulted for Vancomycin dosing.  Plan: Pt given Vancomycin 2000 mg once. Vancomycin 1250 mg IV Q 12 hrs. Goal AUC 400-550. Expected AUC: 523.4 SCr used: 1.29  Pharmacy will continue to follow and will adjust abx dosing whenever warranted.  Temp (24hrs), Avg:97.7 F (36.5 C), Min:97.3 F (36.3 C), Max:98.3 F (36.8 C)   Recent Labs  Lab 01/14/23 0400 01/14/23 0605 01/14/23 1434 01/14/23 1451 01/14/23 1709  WBC 6.1  --   --  6.1  --   CREATININE 1.29*  --   --   --   --   LATICACIDVEN 3.4* 2.9* 1.7  --  1.7    Estimated Creatinine Clearance: 80.2 mL/min (A) (by C-G formula based on SCr of 1.29 mg/dL (H)).    No Known Allergies  Antimicrobials this admission: 2/11 Azithromycin >> x 5 days 2/11 Unasyn >> 2/12 2/12 Vancomycin >>  Microbiology results: 2/11 BCx: 2 (same set) of 4 bottles w/ Staph Epi, mecA/C detected  Thank you for allowing pharmacy to be a part of this patient's care.  Renda Rolls, PharmD, [facility] 01/15/2023 3:13 AM

## 2023-01-15 NOTE — Hospital Course (Addendum)
Andrew Sparks is a 57 y.o. male with medical history significant of paroxysmal atrial fibrillation, chronic HFpEF, chronic respiratory failure on 2 to 3 L at home hypertension, alcohol abuse, seizure disorder and depression was brought to ED via EMS when he fell out of chair and had reported seizure-like activity.  Patient states that he had stopped taking most of his home medications including his Keppra due to lack of money.  02/11: In the emergency room, patient noted to be hypoxic requiring 6 L of oxygen, atrial fibrillation with rapid ventricular rate and lab work noteworthy for alcohol level at 419 and lactic acid level 3.4.  Chest x-ray noted multilobar pneumonia with concerns for aspiration.  Patient started on IV Unasyn and Keppra.  Also noted to have thrombocytopenia.Patient admitted to hospitalist service.   Hematology consulted for low platelets.  Patient also voiced some suicidal thoughts without plan and psychiatry consulted.   Over next few days, patient's symptoms improved.  Oxygenation improved down to baseline.  PE ruled out.  Hematology recommended restarting anticoagulation only after platelet count above 50, which was by 2/15.   Patient seen by physical therapy who recommended skilled nursing.   Psychiatry felt patient not a danger to himself and not a suicide risk. Discharge to SNF has been challenging d/t psychiatric history, TOC following   Consultants:  Hematology Psychiatry   Procedures: ***      ASSESSMENT & PLAN:   Active Problems:   Sepsis with acute hypoxic respiratory failure without septic shock (HCC)   Seizure-like activity (HCC)   Thrombocytopenia (HCC)   Paroxysmal atrial fibrillation (HCC)   Chronic diastolic CHF (congestive heart failure) ([place])   Alcohol abuse   Essential hypertension   Major depressive disorder, recurrent severe without psychotic features (Andrew Sparks)   Tobacco abuse   Dyslipidemia   Obesity (BMI 30-39.9)   Acute on chronic  respiratory failure with hypoxia (HCC)-resolved as of 01/18/2023 Secondary to aspiration pneumonia.   Uses 3 to 4 L at baseline Continue chronic O2     Sepsis with acute hypoxic respiratory failure without septic shock [facility]) Patient met severe sepsis criteria with tachycardia, tachypnea, imaging concerning for pneumonia.  Elevated lactic acid at 3.4>>2.9>>1.7 and AKI.   Patient was started on Unasyn for concern of aspiration most likely secondary to alcohol intoxication. 1 set of blood cultures with MRSE-most likely contaminant but vancomycin was also added.  Repeat blood cultures remained negative so vancomycin was discontinued. Unasyn and Zithromax-completed a 5-day course All cultures to date have been negative except for a Parvovirus B19 IgG titer.    Seizure-like activity [facility]) Patient has an history of seizure disorder and stopped taking his Keppra for about a month.  No seizure-like activity noted in ED.  Initially received loading dose Keppra continue on home dose of Keppra.   Thrombocytopenia ([place]) Acute onset thrombocytopenia with platelet at 54>>44>>45.  No obvious bleeding.  LDH mildly elevated.  Smear was negative for any abnormal morphology. Platelets normal 84-monthago.RUQ [place] for hepatics cirrhosis, can be due to alcohol use. 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   Hematology was consulted.  Platelet count staying now above 50, Eliquis restarted as of 2/15, and platelet count still steadily improving. Continue ELiquis Monitor Plt/CBC outpatient    Paroxysmal atrial fibrillation (HCC) EKG with A-fib and RVR.   Patient was not very  compliant with his home amiodarone and metoprolol. Heart rate with some improvement after increasing the dose of metoprolol Continue with increased the dose of metoprolol at 100 mg twice  daily Continue amiodarone Initially held home Eliquis due to thrombocytopenia-restarted on 2/15   Chronic diastolic CHF (congestive heart failure) ([place]) Echo done in November 2023 with normal EF and grade 1 diastolic dysfunction. BNP of 227.  1+ lower extremity edema.  Patient received 1 dose of IV Lasix Restart home Lasix at 40 mg daily p.o. Avoid IV fluid. Continue to monitor   Alcohol abuse Markedly elevated ethanol levels on admission.   Patient uses liquor regularly. Counseling was provided. CIWA protocol with Ativan   Essential hypertension Patient was on multiple antihypertensives at home which he was not taking it for the past couple of weeks.   Blood pressure now within goal after restarting home amlodipine. Continue home amlodipine We can restart ACE inhibitor if needed as AKI has been resolved. Metoprolol dose was increased and restarted home Lasix   Major depressive disorder, recurrent severe without psychotic features [facility]) Patient exhibits some suicidal thoughts on first night of hospitalization so he was IVC'ed.  Denies any suicidal thoughts by following morning.   Seen by psychiatry and cleared.   Had not been taking his home Seroquel or Effexor.   Since hospitalization, patient has been more anxious.   Restarted Effexor and Seroquel.   As needed Xanax.   AKI (acute kidney injury) (HCC)-resolved as of 01/18/2023 Resolved with hydration Most likely secondary to sepsis.   Monitor renal function Avoid nephrotoxin   Tobacco abuse Counseling was provided. Nicotine patch   Dyslipidemia Continue statin   Obesity (BMI 30-39.9) Meets criteria BMI greater than 30    DVT prophylaxis: Eliquis Pertinent IV fluids/nutrition: no continuous IV fluids Central lines / invasive devices: none  Code Status: FULL   Current Admission Status: inpatient  TOC needs / Dispo plan: SNF Barriers to discharge / significant pending items:  Awaiting bed availability, difficult  due to patient's initial suicidal ideation

## 2023-01-15 NOTE — Progress Notes (Signed)
PHARMACY - PHYSICIAN COMMUNICATION CRITICAL VALUE ALERT - BLOOD CULTURE IDENTIFICATION (BCID)  Results for orders placed or performed during the hospital encounter of 01/14/23  Blood culture (single)     Status: None (Preliminary result)   Collection Time: 01/14/23  6:45 AM   Specimen: BLOOD  Result Value Ref Range Status   Specimen Description BLOOD LEFT FOREARM  Final   Special Requests   Final    BOTTLES DRAWN AEROBIC AND ANAEROBIC Blood Culture results may not be optimal due to an inadequate volume of blood received in culture bottles   Culture  Setup Time   Final    GRAM POSITIVE COCCI IN BOTH AEROBIC AND ANAEROBIC BOTTLES Organism ID to follow CRITICAL RESULT CALLED TO, READ BACK BY AND VERIFIED WITH: Pluma Diniz 01/15/2023 AT 0216 SRR Performed at [facility], [address] [place], [place] [address]    Culture GRAM POSITIVE COCCI  Final   Report Status PENDING  Incomplete  Blood Culture ID Panel (Reflexed)     Status: Abnormal   Collection Time: 01/14/23  6:45 AM  Result Value Ref Range Status   Enterococcus faecalis NOT DETECTED NOT DETECTED Final   Enterococcus Faecium NOT DETECTED NOT DETECTED Final   Listeria monocytogenes NOT DETECTED NOT DETECTED Final   Staphylococcus species DETECTED (A) NOT DETECTED Final    Comment: CRITICAL RESULT CALLED TO, READ BACK BY AND VERIFIED WITH: Caidyn Henricksen 01/15/2023 AT 0216 SRR    Staphylococcus aureus (BCID) NOT DETECTED NOT DETECTED Final   Staphylococcus epidermidis DETECTED (A) NOT DETECTED Final    Comment: Methicillin (oxacillin) resistant coagulase negative staphylococcus. Possible blood culture contaminant (unless isolated from more than one blood culture draw or clinical case suggests pathogenicity). No antibiotic treatment is indicated for blood  culture contaminants. CRITICAL RESULT CALLED TO, READ BACK BY AND VERIFIED WITH: Cindia Hustead 01/15/2023 AT 0216 SRR    Staphylococcus lugdunensis NOT DETECTED NOT  DETECTED Final   Streptococcus species NOT DETECTED NOT DETECTED Final   Streptococcus agalactiae NOT DETECTED NOT DETECTED Final   Streptococcus pneumoniae NOT DETECTED NOT DETECTED Final   Streptococcus pyogenes NOT DETECTED NOT DETECTED Final   A.calcoaceticus-baumannii NOT DETECTED NOT DETECTED Final   Bacteroides fragilis NOT DETECTED NOT DETECTED Final   Enterobacterales NOT DETECTED NOT DETECTED Final   Enterobacter cloacae complex NOT DETECTED NOT DETECTED Final   Escherichia coli NOT DETECTED NOT DETECTED Final   Klebsiella aerogenes NOT DETECTED NOT DETECTED Final   Klebsiella oxytoca NOT DETECTED NOT DETECTED Final   Klebsiella pneumoniae NOT DETECTED NOT DETECTED Final   Proteus species NOT DETECTED NOT DETECTED Final   Salmonella species NOT DETECTED NOT DETECTED Final   Serratia marcescens NOT DETECTED NOT DETECTED Final   Haemophilus influenzae NOT DETECTED NOT DETECTED Final   Neisseria meningitidis NOT DETECTED NOT DETECTED Final   Pseudomonas aeruginosa NOT DETECTED NOT DETECTED Final   Stenotrophomonas maltophilia NOT DETECTED NOT DETECTED Final   Candida albicans NOT DETECTED NOT DETECTED Final   Candida auris NOT DETECTED NOT DETECTED Final   Candida glabrata NOT DETECTED NOT DETECTED Final   Candida krusei NOT DETECTED NOT DETECTED Final   Candida parapsilosis NOT DETECTED NOT DETECTED Final   Candida tropicalis NOT DETECTED NOT DETECTED Final   Cryptococcus neoformans/gattii NOT DETECTED NOT DETECTED Final   Methicillin resistance mecA/C DETECTED (A) NOT DETECTED Final    Comment: CRITICAL RESULT CALLED TO, READ BACK BY AND VERIFIED WITH: Tya Haughey 01/15/2023 AT 0216 SRR Performed at [facility], [place], [place] [address]   Resp panel by RT-PCR (RSV, Flu A&B, Covid) Nasal Mucosa     Status: None   Collection Time: 01/14/23  8:52 AM   Specimen: Nasal Mucosa; Nasal Swab  Result Value Ref Range Status   SARS Coronavirus 2 by RT PCR  NEGATIVE NEGATIVE Final    Comment: (NOTE) SARS-CoV-2 target nucleic acids are NOT DETECTED.  The SARS-CoV-2 RNA is generally detectable in upper respiratory specimens during the acute phase of infection. The lowest concentration of SARS-CoV-2 viral copies this assay can detect is 138 copies/mL. A negative result does not preclude SARS-Cov-2 infection and should not be used as the sole basis for treatment or other patient management decisions. A negative result may occur with  improper specimen collection/handling, submission of specimen other than nasopharyngeal swab, presence of viral mutation(s) within the areas targeted by this assay, and inadequate number of viral copies(<138 copies/mL). A negative result must be combined with clinical observations, patient history, and epidemiological information. The expected result is Negative.  Fact Sheet for Patients:  EntrepreneurPulse.com.au  Fact Sheet for Healthcare Providers:  IncredibleEmployment.be  This test is no t yet approved or cleared by the [place] FDA and  has been authorized for detection and/or diagnosis of SARS-CoV-2 by FDA under an Emergency Use Authorization (EUA). This EUA will remain  in effect (meaning this test can be used) for the duration of the COVID-19 declaration under Section 564(b)(1) of the Act, 21 U.S.C.section 360bbb-3(b)(1), unless the authorization is terminated  or revoked sooner.       Influenza A by PCR NEGATIVE NEGATIVE Final   Influenza B by PCR NEGATIVE NEGATIVE Final    Comment: (NOTE) The Xpert Xpress SARS-CoV-2/FLU/RSV plus assay is intended as an aid in the diagnosis of influenza from Nasopharyngeal swab specimens and should not be used as a sole basis for treatment. Nasal washings and aspirates are unacceptable for Xpert Xpress SARS-CoV-2/FLU/RSV testing.  Fact Sheet for Patients: EntrepreneurPulse.com.au  Fact Sheet for  Healthcare Providers: IncredibleEmployment.be  This test is not yet approved or cleared by the [place] FDA and has been authorized for detection and/or diagnosis of SARS-CoV-2 by FDA under an Emergency Use Authorization (EUA). This EUA will remain in effect (meaning this test can be used) for the duration of the COVID-19 declaration under Section 564(b)(1) of the Act, 21 U.S.C. section 360bbb-3(b)(1), unless the authorization is terminated or revoked.     Resp Syncytial Virus by PCR NEGATIVE NEGATIVE Final    Comment: (NOTE) Fact Sheet for Patients: EntrepreneurPulse.com.au  Fact Sheet for Healthcare Providers: IncredibleEmployment.be  This test is not yet approved or cleared by the [place] FDA and has been authorized for detection and/or diagnosis of SARS-CoV-2 by FDA under an Emergency Use Authorization (EUA). This EUA will remain in effect (meaning this test can be used) for the duration of the COVID-19 declaration under Section 564(b)(1) of the Act, 21 U.S.C. section 360bbb-3(b)(1), unless the authorization is terminated or revoked.  Performed at [facility], [address] [place], [place] [address]   MRSA Next Gen by PCR, Nasal     Status: None   Collection Time: 01/14/23  8:52 AM   Specimen: Nasal Mucosa; Nasal Swab  Result Value Ref Range Status   MRSA by PCR Next Gen NOT DETECTED NOT DETECTED Final    Comment: (NOTE) The GeneXpert MRSA Assay (FDA approved for NASAL specimens only), is one component of a comprehensive MRSA colonization surveillance program.  It is not intended to diagnose MRSA infection nor to guide or monitor treatment for MRSA infections. Test performance is not FDA approved in patients less than 56 years old. Performed at [facility], [address] [place], [place] [address]     BCID Results: BCID results: 2 bottles (same set) of 4 w/ Staph Epi, mecA/C  detected. Pt currently on Azithromycin for 5 days for CAP and Unasyn.  Name of provider contacted: Morton Amy, NP   Changes to prescribed antibiotics required: Transition from Unasyn to Vancomycin.  Renda Rolls, PharmD, [facility] 01/15/2023 2:32 AM

## 2023-01-15 NOTE — Progress Notes (Signed)
Progress Note   Patient: Andrew Sparks [id number] DOB: 09-29-1966 DOA: 01/14/2023     1 DOS: the patient was seen and examined on 01/15/2023   Brief hospital course:  Andrew Sparks is a 57 y.o. male with medical history significant of paroxysmal atrial fibrillation, chronic HFpEF, hypertension, alcohol abuse, seizure disorder and depression was brought to ED via EMS when he fell out of chair and had some seizure-like activity, no incontinence or tongue bite, he drank 2 pints of liquor before that.   Patient has stopped taking most of his home medications which includes Keppra, stating that he does not have any money.  ED course.  On arrival patient was in A-fib with RVR, tachypneic and hypoxic in mid 80s requiring up to 6 L of oxygen.  Labs pertinent for platelet count of 54, BUN 26, creatinine 1.29, total bilirubin 2.1 and AST of 50, BNP 227, lactic acid 3.4>>2.9.  Respiratory panel negative for COVID, RSV and influenza.  Ethanol markedly elevated at 419. Chest x-ray with limited information so CT chest without contrast was obtained which shows consolidative airspace disease in the posterior right middle lobe, inferior right lower lobe and posterior left costophrenic sulcus.  Features compatible with multifocal pneumonia and concern of aspiration.  Also concern of pulmonary hypertension, hepatic steatosis and cholelithiasis along with aortic atherosclerosis.   Patient received Keppra load and started on Unasyn for concern of aspiration pneumonia.  Also started on CIWA protocol for concern of alcohol abuse.  2/12: Patient remained mildly tachycardic and tachypneic, blood pressure at 152/124-increasing the dose of metoprolol to 100 mg twice daily.  Preliminary blood cultures with 1 set positive for MRSE, most likely a contaminant, vancomycin was added, ordered repeat blood cultures this morning but patient already received loading dose of vancomycin around 4 AM this morning.   Procalcitonin remains stable at 0.11 and no leukocytosis.  Gradual worsening of thrombocytopenia, platelets at 44.  AST normalized and T. bili improving.  AKI resolved. Mildly elevated LDH.  Checking some more labs to find a cause of thrombocytopenia, also consulted hematology-Dr Maryjane Hurter will see the patient. Patient also expressed some suicidal thoughts without any plan overnight-psych was consulted for more clarification as patient has an history of prior suicidal thoughts and was being treated at geropsych department last year.  Assessment and Plan: * Acute on chronic respiratory failure with hypoxia ([place]) Most likely secondary to aspiration pneumonia.  Uses 3 to 4 L at baseline, currently on 6 L of oxygen.  He was hypoxic up to 86% on baseline in the ED. -Try weaning back to baseline  Sepsis with acute hypoxic respiratory failure without septic shock [facility]) Patient met sepsis criteria with tachycardia, tachypnea, imaging concerning for pneumonia.  Elevated lactic acid at 3.4>>2.9 and AKI.  Respiratory panel negative for COVID, influenza and RSV.  Patient was started on Unasyn for concern of aspiration most likely secondary to alcohol intoxication. 1 set of blood cultures with MRSE-most likely contaminant but vancomycin was also added.  Repeat blood cultures ordered but unfortunately he received a loading dose of vancomycin before that repeat. -Continue vancomycin -Continue Unasyn and Zithromax. -Follow-up blood cultures -Follow strep pneumo and Legionella antigen. -Follow-up sputum cultures   Seizure-like activity (HCC) Concern of seizure at home by girlfriend, no incontinence or tongue bite.  No significant postictal state.  Patient has an history of seizure disorder and stopped taking his Keppra for about a month.  No seizure-like activity noted in ED Received loading dose of  Keppra in ED. -Continue with home dose of Keppra  Thrombocytopenia ([place]) Patient with acute onset  thrombocytopenia with platelet at 54>>44.  No obvious bleeding.  LDH mildly elevated.  Smear was negative for any abnormal morphology. Platelets normal 3-monthago. Checking some more labs to find out the cause of acute thrombocytopenia. Hematology consult -Holding home Eliquis -Monitor platelet   Paroxysmal atrial fibrillation (HCC) EKG with A-fib and RVR.  Patient was not very compliant with his home amiodarone and metoprolol.  Heart rate remained in low 100 this morning -Increase the dose of metoprolol to 100 mg twice daily -Continue amiodarone -Holding home Eliquis due to thrombocytopenia  Chronic diastolic CHF (congestive heart failure) ([place] Echo done in November 2023 with normal EF and grade 1 diastolic dysfunction. BNP of 227.  1+ lower extremity edema. -Giving 1 dose of IV Lasix -Avoid IV fluid. -Continue to monitor  Alcohol abuse Markedly elevated ethanol levels on admission.  Patient uses liquor regularly. Counseling was provided. -CIWA protocol with Ativan  Essential hypertension Patient was on multiple antihypertensives at home which he was not taking it for the past couple of weeks.  Blood pressure now elevated. -Restart home amlodipine -We can restart ACE inhibitor if needed as AKI has been resolved. -Metoprolol dose was increased and also getting 1 dose of IV Lasix  Major depressive disorder, recurrent severe without psychotic features ([facility] Patient exhibits some suicidal thoughts overnight so he was IVC'ed. Denies any suicidal thoughts this morning. Psych consult for further evaluation Not taking his home Seroquel and Effexor.   AKI (acute kidney injury) ([place] Resolved with hydration Most likely secondary to sepsis.   -Monitor renal function -Avoid nephrotoxin  Tobacco abuse Counseling was provided. -Nicotine patch  Dyslipidemia -Continue statin   Subjective: Patient was seen and examined today.  Denies any suicidal thought, stating that he just  want to go home.  He was asking to resume his gabapentin.  Agrees to stay 1-2 more days to complete the workup  Physical Exam: Vitals:   01/15/23 1100 01/15/23 1130 01/15/23 1200 01/15/23 1300  BP: (!) 142/119 (!) 150/115 (!) 144/121 (!) 150/124  Pulse: (!) 119 93 99 91  Resp:  (!) 25 (!) 22 (!) 30  Temp:      TempSrc:      SpO2: 96% 99% 100% 95%  Weight:      Height:       General.  Ill-appearing gentleman, in no acute distress. Pulmonary.  Coarse breath sounds bilaterally, normal respiratory effort. CV.  Regular rate and rhythm, no JVD, rub or murmur. Abdomen.  Soft, nontender, nondistended, BS positive. CNS.  Alert and oriented .  No focal neurologic deficit. Extremities.  1+ LE edema, no cyanosis, pulses intact and symmetrical. Psychiatry.  Appears to have some cognitive impairment  Data Reviewed: Prior data reviewed  Family Communication:   Disposition: Status is: Inpatient Remains inpatient appropriate because: Severity of illness  Planned Discharge Destination: Home with Home Health  DVT prophylaxis.  Eliquis Time spent: 50 minutes  This record has been created using [employer] analyst Errors have been sought and corrected,but may not always be located. Such creation errors do not reflect on the standard of care.   Author: SLorella Nimrod MD 01/15/2023 3:23 PM  For on call review [email or web address]

## 2023-01-15 NOTE — Evaluation (Signed)
Occupational Therapy Evaluation Patient Details Name: Andrew Sparks MRN: [id number] DOB: 09-19-1966 Today's Date: 01/15/2023   History of Present Illness Patient is a 57 year old male with concern for seizure at home. Acute on chronic respiratory failure with hypoxia, sepsis due to pneumonia. Medical history significant of paroxysmal atrial fibrillation, chronic HFpEF, hypertension, alcohol abuse, seizure disorder and depression.   Clinical Impression   Patient presenting with decreased Ind in self care,balance, functional mobility/transfers, endurance, and safety awareness. Patient reports living at home with girlfriend who is available to assist as needed at discharge. Pt reports short distance ambulation with SPC at baseline . He enjoys cooking and most often cooks himself breakfast. He does need assistance for IADLs and occasionally ADLs from significant other. Pt is on 3Ls O2 via [place] at baseline. Upon entering the room, pt has removed O2 and is on RA with O2 saturation of 93 %. He requests assistance to get up into a chair. OT located recliner for him and he stands with min HHA and takes several steps to recliner chair. O2 placed back on pt as he reports increased anxiety as well. Chair alarm donned and OT discussed transfer with RN.  Patient will benefit from acute OT to increase overall independence in the areas of ADLs, functional mobility, and safety awareness in order to safely discharge home with support.      Recommendations for follow up therapy are one component of a multi-disciplinary discharge planning process, led by the attending physician.  Recommendations may be updated based on patient status, additional functional criteria and insurance authorization.   Follow Up Recommendations  Home health OT     Assistance Recommended at Discharge Intermittent Supervision/Assistance  Patient can return home with the following A little help with walking and/or transfers;A little  help with bathing/dressing/bathroom;Help with stairs or ramp for entrance;Assist for transportation;Assistance with cooking/housework    Functional Status Assessment  Patient has had a recent decline in their functional status and demonstrates the ability to make significant improvements in function in a reasonable and predictable amount of time.  Equipment Recommendations  None recommended by OT       Precautions / Restrictions Precautions Precautions: Fall Restrictions Weight Bearing Restrictions: No      Mobility Bed Mobility Overal bed mobility: Needs Assistance Bed Mobility: Supine to Sit, Sit to Supine     Supine to sit: Supervision Sit to supine: Supervision   General bed mobility comments: increased time and effort with no physical assistance required    Transfers Overall transfer level: Needs assistance Equipment used: 1 person hand held assist Transfers: Sit to/from Stand, Bed to chair/wheelchair/BSC Sit to Stand: Min guard     Step pivot transfers: Min assist            Balance Overall balance assessment: Needs assistance Sitting-balance support: Feet unsupported, No upper extremity supported Sitting balance-Leahy Scale: Good     Standing balance support: Single extremity supported Standing balance-Leahy Scale: Fair                             ADL either performed or assessed with clinical judgement   ADL Overall ADL's : Needs assistance/impaired     Grooming: Wash/dry hands;Wash/dry face;Sitting;Supervision/safety;Set up                   Toilet Transfer: Minimal assistance Toilet Transfer Details (indicate cue type and reason): simulated  Functional mobility during ADLs: Minimal assistance       Vision Patient Visual Report: No change from baseline              Pertinent Vitals/Pain Pain Assessment Pain Assessment: No/denies pain     Hand Dominance Right   Extremity/Trunk Assessment Upper  Extremity Assessment Upper Extremity Assessment: Generalized weakness   Lower Extremity Assessment Lower Extremity Assessment: Generalized weakness       Communication Communication Communication: No difficulties   Cognition Arousal/Alertness: Awake/alert Behavior During Therapy: Anxious Overall Cognitive Status: Within Functional Limits for tasks assessed                                                  Home Living Family/patient expects to be discharged to:: Private residence Living Arrangements: Spouse/significant other Available Help at Discharge: Friend(s);Available PRN/intermittently Type of Home: Apartment Home Access: Stairs to enter Entrance Stairs-Number of Steps: 1 Entrance Stairs-Rails: None Home Layout: One level     Bathroom Shower/Tub: Teacher, early years/pre: Handicapped height     Home Equipment: Cane - single Barista (2 wheels);BSC/3in1;Shower seat          Prior Functioning/Environment Prior Level of Function : Needs assist             Mobility Comments: short distance ambulation on 3-4 L02 with his cane. ADLs Comments: has assistance if needed which is required intermittently depending on fatigue level        OT Problem List: Decreased strength;Decreased range of motion;Decreased activity tolerance;Impaired balance (sitting and/or standing);Decreased safety awareness;Decreased knowledge of use of DME or AE;Cardiopulmonary status limiting activity      OT Treatment/Interventions: Self-care/ADL training;Therapeutic exercise;Therapeutic activities;Energy conservation;DME and/or AE instruction;Balance training;Patient/family education    OT Goals(Current goals can be found in the care plan section) Acute Rehab OT Goals Patient Stated Goal: to go home OT Goal Formulation: With patient Time For Goal Achievement: 01/29/23 Potential to Achieve Goals: Good ADL Goals Pt Will Perform Grooming: with  modified independence;standing Pt Will Perform Lower Body Dressing: with modified independence;sit to/from stand Pt Will Transfer to Toilet: with modified independence;ambulating Pt Will Perform Toileting - Clothing Manipulation and hygiene: with modified independence;sit to/from stand  OT Frequency: Min 2X/week       AM-PAC OT "6 Clicks" Daily Activity     Outcome Measure Help from another person eating meals?: None Help from another person taking care of personal grooming?: A Little Help from another person toileting, which includes using toliet, bedpan, or urinal?: A Little Help from another person bathing (including washing, rinsing, drying)?: A Little Help from another person to put on and taking off regular upper body clothing?: None Help from another person to put on and taking off regular lower body clothing?: A Little 6 Click Score: 20   End of Session Nurse Communication: Mobility status  Activity Tolerance: Patient tolerated treatment well Patient left: in chair;with chair alarm set;with call bell/phone within reach  OT Visit Diagnosis: Unsteadiness on feet (R26.81);Muscle weakness (generalized) (M62.81);History of falling (Z91.81)                Time: [id number] OT Time Calculation (min): 26 min Charges:  OT General Charges $OT Visit: 1 Visit OT Evaluation $OT Eval Low Complexity: 1 Low OT Treatments $Therapeutic Activity: 8-22 mins  Darleen Crocker, MS, OTR/L , CBIS  ascom [phone]  01/15/23, 3:24 PM

## 2023-01-15 NOTE — ED Notes (Signed)
Pt has not been comfortable in the bed at all today. Pt stated he wasn't comfortable, needed to get back into the recliner/chair and that he was having an anxiety attack. This RN was able to get the pt set up in the recliner, hook back up to the monitor. Pt asked if he could have something for his anxiety and this RN told him that I would ask the doctor for something. Message was sent to MD.

## 2023-01-15 NOTE — Consult Note (Signed)
[facility] Consult   Reason for Consult:  Suicidal Ideation Referring Physician:  Dr. Jacelyn Grip  Patient Identification: Andrew Sparks MRN:  [id number] Principal Diagnosis: Suicidal ideation Diagnosis:  Principal Problem:   Suicidal ideation Active Problems:   Chronic diastolic CHF (congestive heart failure) ([place])   Alcohol abuse   Tobacco abuse   AKI (acute kidney injury) ([place])   Essential hypertension   Paroxysmal atrial fibrillation (HCC)   Major depressive disorder, recurrent severe without psychotic features ([place])   Dyslipidemia   Seizure-like activity ([place])   Acute on chronic respiratory failure with hypoxia ([place])   Sepsis with acute hypoxic respiratory failure without septic shock (HCC)   Thrombocytopenia ([place])   Alcoholic intoxication with complication ([place])   Total Time spent with patient: 45 minutes  Subjective:  " I said that because I wanted to go home". Andrew Sparks is a 57 y.o. male was placed on involuntary commitment due to endorsing suicidal thoughts without a plan.   Patient was observed sitting up in bed, receiving supplemental oxygen via nasal cannula. Assigned RN at the bedside obtaining blood cultures. Patient denies current suicidal thoughts and reports no history of self-harm. He denies homicidal ideation. He denies having any weapons in the home. He denies feeling sad or depressed and denies experiencing auditory or visual hallucinations. Patient does not appear to be responding to internal or external stimuli. No evidence of delusional thinking noted during evaluation.   Patient admits he has been noncompliant with his mental health medications, stating, "I can't find half of them".  Patient further reports he has not been attending appointments with his outpatient provider and does not recall the last time he took his medications or the specific medications he was taking. He admits to consuming a pint of wine daily but denies  any other illicit substance use. Patient reports he has not been following up with a therapist. He reports being at a facility last year for a similar issue. The patient reports he lives with his girlfriend, Andrew Sparks and gives consent to contact her to obtain collateral. UDS (-) ETOH 419.     IVC paperwork: Per K. Foust, NP,  Andrew. Schill endorsed suicidal ideation to myself and his nurse and also voiced a desire to leave the hospital.  He does not currently have a plan and states being in the hospital is preventing him from forming a plan.  He has a history of alcohol abuse, was intoxicated on arrival and reports drinking 2 pints of liquor prior to ED arrival.   Collateral: Andrew Sparks (significant other), reports that patient has not voiced any suicidal thoughts to her and denies any history of suicide attempts.  Andrew Sparks also reports that Andrew. Gentle appears depressed, which may be exacerbated by his alcohol consumption and discontinuation of his depression medication.  Andrew Sparks reports patient has a history of heavy alcohol consumption, drinking up to 2 to 3 pints of liquor daily, and smoking cigarettes.  Andrew Sparks believes that his mental health issues are primarily related to his alcohol use.  Andrew Sparks further reports, he has been self-medicating with alcohol for his leg pain due to neuropathy, which worsens his condition. Andrew Sparks reports patient has not been taking his prescribed medication for depression for approximately 3 months.  Additionally, he has not eaten for about 2 weeks, refusing food when offered.  Andrew Sparks is concerned about his mental health and wellbeing but has not observed any signs of psychosis or hallucinations.  Plan - The patient does not meet criteria for inpatient psychiatric admission.  Patient educated on the importance of medication compliance and potential consequences of noncompliance. Patient encouraged to schedule and attend regular appointments with his  outpatient provider for proper mental health care and medication management. Patient educated impact of alcohol on his mental health and recommended to consider strategies for abstinence.  Reviewed with Dr. Reesa Chew, EDP.   HPI:  Per K. Otilio Miu, NP -  Message received from RN reporting Andrew Sparks is experiencing Suicidal ideation without a plan.   On my bedside evaluation Andrew Sparks has fair insight and judgement and endorses suicidal ideation without a plan stating being in the hospital is stopping him from having a plan. He later states the only reason he stated he wanted to harm himself is so that he can leave the hospital. Unclear if he is saying this now that he was told he may be IVC'd after my assessment. Andrew Sparks has a history of suicidal ideation and was admitted to geropsych for the same last year.  Past Psychiatric History: Depression, alcohol abuse, anxiety. History of suicidal ideation and was admitted to geropsych for the same last year.  Risk to Self:  No  Risk to Others:  No  Prior Inpatient Therapy:  Yes  Prior Outpatient Therapy:  Yes   Past Medical History:  Past Medical History:  Diagnosis Date   A-fib ([place])    Anxiety    CHF (congestive heart failure) (HCC)    Depression    Hypersomnia with sleep apnea    Hypertension    Morbid obesity ([place])    NSTEMI (non-ST elevated myocardial infarction) ([place]) 2005   Obesity hypoventilation syndrome (HCC)    Shortness of breath dyspnea    Sleep apnea    Stroke ([place])    V-tach [facility])     Past Surgical History:  Procedure Laterality Date   LEFT HEART CATH AND CORONARY ANGIOGRAPHY Right 09/10/2018   Procedure: LEFT HEART CATH AND CORONARY ANGIOGRAPHY with possible percutaneous intervention;  Surgeon: Dionisio David, MD;  Location: [facility];  Service: Cardiovascular;  Laterality: Right;   TRACHEOSTOMY     Family History:  Family History  Problem Relation Age of Onset   Heart disease Father    Diabetes type II Mother     Breast cancer Mother    Family Psychiatric  History: Unremarkable  Social History:  Social History   Substance and Sexual Activity  Alcohol Use Yes   Alcohol/week: 4.0 standard drinks of alcohol   Types: 4 Cans of beer per week   Comment: 1/5 of whiskey dailty for last 5 days     Social History   Substance and Sexual Activity  Drug Use Yes   Types: Marijuana    Social History   Socioeconomic History   Marital status: Single    Spouse name: Not on file   Number of children: Not on file   Years of education: Not on file   Highest education level: Not on file  Occupational History   Occupation: disabled  Tobacco Use   Smoking status: Every Day    Packs/day: 0.25    Years: 28.00    Total pack years: 7.00    Types: Cigarettes   Smokeless tobacco: Never  Vaping Use   Vaping Use: Never used  Substance and Sexual Activity   Alcohol use: Yes    Alcohol/week: 4.0 standard drinks of alcohol    Types: 4 Cans of  beer per week    Comment: 1/5 of whiskey dailty for last 5 days   Drug use: Yes    Types: Marijuana   Sexual activity: Yes  Other Topics Concern   Not on file  Social History Narrative   Not on file   Social Determinants of Health   Financial Resource Strain: Not on file  Food Insecurity: Not on file  Transportation Needs: Not on file  Physical Activity: Not on file  Stress: Not on file  Social Connections: Not on file   Additional Social History:    Allergies:  No Known Allergies  Labs:  Results for orders placed or performed during the hospital encounter of 01/14/23 (from the past 48 hour(s))  Comprehensive metabolic panel     Status: Abnormal   Collection Time: 01/14/23  4:00 AM  Result Value Ref Range   Sodium 138 135 - 145 mmol/L   Potassium 4.5 3.5 - 5.1 mmol/L    Comment: HEMOLYSIS AT THIS LEVEL MAY AFFECT RESULT   Chloride 94 (L) 98 - 111 mmol/L   CO2 24 22 - 32 mmol/L   Glucose, Bld 75 70 - 99 mg/dL    Comment: Glucose reference range  applies only to samples taken after fasting for at least 8 hours.   BUN 26 (H) 6 - 20 mg/dL   Creatinine, Ser 4.26 (H) 0.61 - 1.24 mg/dL   Calcium 8.1 (L) 8.9 - 10.3 mg/dL   Total Protein 7.8 6.5 - 8.1 g/dL   Albumin 3.8 3.5 - 5.0 g/dL   AST 50 (H) 15 - 41 U/L   ALT 19 0 - 44 U/L   Alkaline Phosphatase 83 38 - 126 U/L   Total Bilirubin 2.1 (H) 0.3 - 1.2 mg/dL   GFR, Estimated >83 >41 mL/min    Comment: (NOTE) Calculated using the CKD-EPI Creatinine Equation (2021)    Anion gap 20 (H) 5 - 15    Comment: Performed at [facility], [address] [place], [place] [address]  CBC with Differential/Platelet     Status: Abnormal   Collection Time: 01/14/23  4:00 AM  Result Value Ref Range   WBC 6.1 4.0 - 10.5 K/uL   RBC 3.87 (L) 4.22 - 5.81 MIL/uL   Hemoglobin 13.0 13.0 - 17.0 g/dL   HCT 97.9 89.2 - 11.9 %   MCV 103.9 (H) 80.0 - 100.0 fL   MCH 33.6 26.0 - 34.0 pg   MCHC 32.3 30.0 - 36.0 g/dL   RDW 41.7 (H) 40.8 - 14.4 %   Platelets 54 (L) 150 - 400 K/uL    Comment: Immature Platelet Fraction may be clinically indicated, consider ordering this additional test [id number]    nRBC 0.8 (H) 0.0 - 0.2 %   Neutrophils Relative % 88 %   Neutro Abs 5.4 1.7 - 7.7 K/uL   Lymphocytes Relative 6 %   Lymphs Abs 0.4 (L) 0.7 - 4.0 K/uL   Monocytes Relative 5 %   Monocytes Absolute 0.3 0.1 - 1.0 K/uL   Eosinophils Relative 0 %   Eosinophils Absolute 0.0 0.0 - 0.5 K/uL   Basophils Relative 0 %   Basophils Absolute 0.0 0.0 - 0.1 K/uL   Immature Granulocytes 1 %   Abs Immature Granulocytes 0.06 0.00 - 0.07 K/uL    Comment: Performed at [facility], [address] [place], [place] [address]  Brain natriuretic peptide     Status: Abnormal   Collection Time: 01/14/23  4:00 AM  Result Value Ref Range   B Natriuretic Peptide 227.3 (H) 0.0 - 100.0 pg/mL    Comment: Performed at [facility], [address] [place], [place] [address]  Magnesium     Status: Abnormal    Collection Time: 01/14/23  4:00 AM  Result Value Ref Range   Magnesium 1.6 (L) 1.7 - 2.4 mg/dL    Comment: Performed at [facility], [address] [place], [place] [address]  Troponin I (High Sensitivity)     Status: Abnormal   Collection Time: 01/14/23  4:00 AM  Result Value Ref Range   Troponin I (High Sensitivity) 30 (H) <18 ng/L    Comment: (NOTE) Elevated high sensitivity troponin I (hsTnI) values and significant  changes across serial measurements may suggest ACS but many other  chronic and acute conditions are known to elevate hsTnI results.  Refer to the "Links" section for chest pain algorithms and additional  guidance. Performed at [facility], [address] [place], [place] [address]   Ethanol     Status: Abnormal   Collection Time: 01/14/23  4:00 AM  Result Value Ref Range   Alcohol, Ethyl (B) 419 (HH) <10 mg/dL    Comment: CRITICAL RESULT CALLED TO, READ BACK BY AND VERIFIED WITH ANNA JASPER AT 0459 01/14/2023 DLB (NOTE) Lowest detectable limit for serum alcohol is 10 mg/dL.  For medical purposes only. Performed at [facility], [address] [place], [place] [address]   Lactic acid, plasma     Status: Abnormal   Collection Time: 01/14/23  4:00 AM  Result Value Ref Range   Lactic Acid, Venous 3.4 (HH) 0.5 - 1.9 mmol/L    Comment: CRITICAL RESULT CALLED TO, READ BACK BY AND VERIFIED WITH ANNA JASPER AT [phone] 01/14/2023 DLB Performed at [facility], [address] [place], [place] [address]   Lactic acid, plasma     Status: Abnormal   Collection Time: 01/14/23  6:05 AM  Result Value Ref Range   Lactic Acid, Venous 2.9 (HH) 0.5 - 1.9 mmol/L    Comment: CRITICAL VALUE NOTED. VALUE IS CONSISTENT WITH PREVIOUSLY REPORTED/CALLED VALUE.PMF Performed at [facility], [address] [place], [place] [address]   Troponin I (High Sensitivity)     Status: Abnormal   Collection Time: 01/14/23  6:05 AM  Result  Value Ref Range   Troponin I (High Sensitivity) 42 (H) <18 ng/L    Comment: (NOTE) Elevated high sensitivity troponin I (hsTnI) values and significant  changes across serial measurements may suggest ACS but many other  chronic and acute conditions are known to elevate hsTnI results.  Refer to the "Links" section for chest pain algorithms and additional  guidance. Performed at [facility], [address] [place], [place] [address]   Blood culture (single)     Status: None (Preliminary result)   Collection Time: 01/14/23  6:45 AM   Specimen: BLOOD  Result Value Ref Range   Specimen Description BLOOD LEFT FOREARM    Special Requests      BOTTLES DRAWN AEROBIC AND ANAEROBIC Blood Culture results may not be optimal due to an inadequate volume of blood received in culture bottles   Culture  Setup Time      GRAM POSITIVE COCCI IN BOTH AEROBIC AND ANAEROBIC BOTTLES Organism ID to follow CRITICAL RESULT CALLED TO, READ BACK BY AND VERIFIED WITH: NATHAN BELUE 01/15/2023 AT 0216 SRR Performed at [facility], [address], [place], [place] [address]  Culture GRAM POSITIVE COCCI    Report Status PENDING   Blood Culture ID Panel (Reflexed)     Status: Abnormal   Collection Time: 01/14/23  6:45 AM  Result Value Ref Range   Enterococcus faecalis NOT DETECTED NOT DETECTED   Enterococcus Faecium NOT DETECTED NOT DETECTED   Listeria monocytogenes NOT DETECTED NOT DETECTED   Staphylococcus species DETECTED (A) NOT DETECTED    Comment: CRITICAL RESULT CALLED TO, READ BACK BY AND VERIFIED WITH: NATHAN BELUE 01/15/2023 AT 0216 SRR    Staphylococcus aureus (BCID) NOT DETECTED NOT DETECTED   Staphylococcus epidermidis DETECTED (A) NOT DETECTED    Comment: Methicillin (oxacillin) resistant coagulase negative staphylococcus. Possible blood culture contaminant (unless isolated from more than one blood culture draw or clinical case suggests pathogenicity). No antibiotic treatment  is indicated for blood  culture contaminants. CRITICAL RESULT CALLED TO, READ BACK BY AND VERIFIED WITH: NATHAN BELUE 01/15/2023 AT 0216 SRR    Staphylococcus lugdunensis NOT DETECTED NOT DETECTED   Streptococcus species NOT DETECTED NOT DETECTED   Streptococcus agalactiae NOT DETECTED NOT DETECTED   Streptococcus pneumoniae NOT DETECTED NOT DETECTED   Streptococcus pyogenes NOT DETECTED NOT DETECTED   A.calcoaceticus-baumannii NOT DETECTED NOT DETECTED   Bacteroides fragilis NOT DETECTED NOT DETECTED   Enterobacterales NOT DETECTED NOT DETECTED   Enterobacter cloacae complex NOT DETECTED NOT DETECTED   Escherichia coli NOT DETECTED NOT DETECTED   Klebsiella aerogenes NOT DETECTED NOT DETECTED   Klebsiella oxytoca NOT DETECTED NOT DETECTED   Klebsiella pneumoniae NOT DETECTED NOT DETECTED   Proteus species NOT DETECTED NOT DETECTED   Salmonella species NOT DETECTED NOT DETECTED   Serratia marcescens NOT DETECTED NOT DETECTED   Haemophilus influenzae NOT DETECTED NOT DETECTED   Neisseria meningitidis NOT DETECTED NOT DETECTED   Pseudomonas aeruginosa NOT DETECTED NOT DETECTED   Stenotrophomonas maltophilia NOT DETECTED NOT DETECTED   Candida albicans NOT DETECTED NOT DETECTED   Candida auris NOT DETECTED NOT DETECTED   Candida glabrata NOT DETECTED NOT DETECTED   Candida krusei NOT DETECTED NOT DETECTED   Candida parapsilosis NOT DETECTED NOT DETECTED   Candida tropicalis NOT DETECTED NOT DETECTED   Cryptococcus neoformans/gattii NOT DETECTED NOT DETECTED   Methicillin resistance mecA/C DETECTED (A) NOT DETECTED    Comment: CRITICAL RESULT CALLED TO, READ BACK BY AND VERIFIED WITH: NATHAN BELUE 01/15/2023 AT 0216 SRR Performed at [facility], [address] [place], [place] [address]   Procalcitonin - Baseline     Status: None   Collection Time: 01/14/23  8:52 AM  Result Value Ref Range   Procalcitonin 0.11 ng/mL    Comment:        Interpretation: PCT  (Procalcitonin) <= 0.5 ng/mL: Systemic infection (sepsis) is not likely. Local bacterial infection is possible. (NOTE)       Sepsis PCT Algorithm           Lower Respiratory Tract                                      Infection PCT Algorithm    ----------------------------     ----------------------------         PCT < 0.25 ng/mL                PCT < 0.10 ng/mL          Strongly encourage  Strongly discourage   discontinuation of antibiotics    initiation of antibiotics    ----------------------------     -----------------------------       PCT 0.25 - 0.50 ng/mL            PCT 0.10 - 0.25 ng/mL               OR       >80% decrease in PCT            Discourage initiation of                                            antibiotics      Encourage discontinuation           of antibiotics    ----------------------------     -----------------------------         PCT >= 0.50 ng/mL              PCT 0.26 - 0.50 ng/mL               AND        <80% decrease in PCT             Encourage initiation of                                             antibiotics       Encourage continuation           of antibiotics    ----------------------------     -----------------------------        PCT >= 0.50 ng/mL                  PCT > 0.50 ng/mL               AND         increase in PCT                  Strongly encourage                                      initiation of antibiotics    Strongly encourage escalation           of antibiotics                                     -----------------------------                                           PCT <= 0.25 ng/mL                                                 OR                                        >  80% decrease in PCT                                      Discontinue / Do not initiate                                             antibiotics  Performed at [facility], [address] [place], [place] [address]   Resp panel by  RT-PCR (RSV, Flu A&B, Covid) Nasal Mucosa     Status: None   Collection Time: 01/14/23  8:52 AM   Specimen: Nasal Mucosa; Nasal Swab  Result Value Ref Range   SARS Coronavirus 2 by RT PCR NEGATIVE NEGATIVE    Comment: (NOTE) SARS-CoV-2 target nucleic acids are NOT DETECTED.  The SARS-CoV-2 RNA is generally detectable in upper respiratory specimens during the acute phase of infection. The lowest concentration of SARS-CoV-2 viral copies this assay can detect is 138 copies/mL. A negative result does not preclude SARS-Cov-2 infection and should not be used as the sole basis for treatment or other patient management decisions. A negative result may occur with  improper specimen collection/handling, submission of specimen other than nasopharyngeal swab, presence of viral mutation(s) within the areas targeted by this assay, and inadequate number of viral copies(<138 copies/mL). A negative result must be combined with clinical observations, patient history, and epidemiological information. The expected result is Negative.  Fact Sheet for Patients:  BloggerCourse.com  Fact Sheet for Healthcare Providers:  SeriousBroker.it  This test is no t yet approved or cleared by the [place] FDA and  has been authorized for detection and/or diagnosis of SARS-CoV-2 by FDA under an Emergency Use Authorization (EUA). This EUA will remain  in effect (meaning this test can be used) for the duration of the COVID-19 declaration under Section 564(b)(1) of the Act, 21 U.S.C.section 360bbb-3(b)(1), unless the authorization is terminated  or revoked sooner.       Influenza A by PCR NEGATIVE NEGATIVE   Influenza B by PCR NEGATIVE NEGATIVE    Comment: (NOTE) The Xpert Xpress SARS-CoV-2/FLU/RSV plus assay is intended as an aid in the diagnosis of influenza from Nasopharyngeal swab specimens and should not be used as a sole basis for treatment. Nasal  washings and aspirates are unacceptable for Xpert Xpress SARS-CoV-2/FLU/RSV testing.  Fact Sheet for Patients: BloggerCourse.com  Fact Sheet for Healthcare Providers: SeriousBroker.it  This test is not yet approved or cleared by the [place] FDA and has been authorized for detection and/or diagnosis of SARS-CoV-2 by FDA under an Emergency Use Authorization (EUA). This EUA will remain in effect (meaning this test can be used) for the duration of the COVID-19 declaration under Section 564(b)(1) of the Act, 21 U.S.C. section 360bbb-3(b)(1), unless the authorization is terminated or revoked.     Resp Syncytial Virus by PCR NEGATIVE NEGATIVE    Comment: (NOTE) Fact Sheet for Patients: BloggerCourse.com  Fact Sheet for Healthcare Providers: SeriousBroker.it  This test is not yet approved or cleared by the [place] FDA and has been authorized for detection and/or diagnosis of SARS-CoV-2 by FDA under an Emergency Use Authorization (EUA). This EUA will remain in effect (meaning this test can be used) for the duration of the COVID-19 declaration under Section 564(b)(1) of the Act, 21 U.S.C. section 360bbb-3(b)(1), unless  the authorization is terminated or revoked.  Performed at [facility], [address] [place], [place] [address]   Platelet count     Status: Abnormal   Collection Time: 01/14/23  8:52 AM  Result Value Ref Range   Platelets 49 (L) 150 - 400 K/uL    Comment: Immature Platelet Fraction may be clinically indicated, consider ordering this additional test [id number] Performed at [facility], [address] [place], [place] [address]   Strep pneumoniae urinary antigen     Status: None   Collection Time: 01/14/23  8:52 AM  Result Value Ref Range   Strep Pneumo Urinary Antigen NEGATIVE NEGATIVE    Comment:        Infection due to S.  pneumoniae cannot be absolutely ruled out since the antigen present may be below the detection limit of the test. Performed at [facility], [address] [place], [place] [address]   MRSA Next Gen by PCR, Nasal     Status: None   Collection Time: 01/14/23  8:52 AM   Specimen: Nasal Mucosa; Nasal Swab  Result Value Ref Range   MRSA by PCR Next Gen NOT DETECTED NOT DETECTED    Comment: (NOTE) The GeneXpert MRSA Assay (FDA approved for NASAL specimens only), is one component of a comprehensive MRSA colonization surveillance program. It is not intended to diagnose MRSA infection nor to guide or monitor treatment for MRSA infections. Test performance is not FDA approved in patients less than 57 years old. Performed at [facility], [address] [place], [place] [address]   Vitamin B12     Status: None   Collection Time: 01/14/23  8:52 AM  Result Value Ref Range   Vitamin B-12 658 180 - 914 pg/mL    Comment: (NOTE) This assay is not validated for testing neonatal or myeloproliferative syndrome specimens for Vitamin B12 levels. Performed at [facility], [address] [place], [place] [address]   Reticulocytes     Status: Abnormal   Collection Time: 01/14/23  8:52 AM  Result Value Ref Range   Retic Ct Pct 4.0 (H) 0.4 - 3.1 %   RBC. 3.39 (L) 4.22 - 5.81 MIL/uL   Retic Count, Absolute 134.2 19.0 - 186.0 K/uL   Immature Retic Fract 28.3 (H) 2.3 - 15.9 %    Comment: Performed at [facility], [address] [place], [place] [address]  Protime-INR     Status: None   Collection Time: 01/14/23 11:01 AM  Result Value Ref Range   Prothrombin Time 13.9 11.4 - 15.2 seconds   INR 1.1 0.8 - 1.2    Comment: (NOTE) INR goal varies based on device and disease states. Performed at [facility], [address] [place], [place] [address]   Lactic acid, plasma     Status: None   Collection Time: 01/14/23  2:34 PM  Result Value Ref Range    Lactic Acid, Venous 1.7 0.5 - 1.9 mmol/L    Comment: Performed at [facility], [address] [place], [place] [address]  Technologist smear review     Status: None   Collection Time: 01/14/23  2:51 PM  Result Value Ref Range   WBC MORPHOLOGY MORPHOLOGY UNREMARKABLE    RBC MORPHOLOGY MORPHOLOGY UNREMARKABLE    Plt Morphology PLATELETS APPEAR DECREASED    Clinical Information Acute thrombocytopenia     Comment: Performed at [facility], [address] [place], [place] [address]  CBC with Differential/Platelet     Status: Abnormal   Collection Time: 01/14/23  2:51 PM  Result Value Ref Range   WBC 6.1 4.0 - 10.5 K/uL   RBC 3.40 (L) 4.22 - 5.81 MIL/uL   Hemoglobin 11.5 (L) 13.0 - 17.0 g/dL   HCT 51.7 (L) 00.1 - 74.9 %   MCV 104.7 (H) 80.0 - 100.0 fL   MCH 33.8 26.0 - 34.0 pg   MCHC 32.3 30.0 - 36.0 g/dL   RDW 44.9 (H) 67.5 - 91.6 %   Platelets 47 (L) 150 - 400 K/uL    Comment: Immature Platelet Fraction may be clinically indicated, consider ordering this additional test [id number]    nRBC 1.2 (H) 0.0 - 0.2 %   Neutrophils Relative % 80 %   Neutro Abs 4.8 1.7 - 7.7 K/uL   Lymphocytes Relative 11 %   Lymphs Abs 0.7 0.7 - 4.0 K/uL   Monocytes Relative 8 %   Monocytes Absolute 0.5 0.1 - 1.0 K/uL   Eosinophils Relative 0 %   Eosinophils Absolute 0.0 0.0 - 0.5 K/uL   Basophils Relative 0 %   Basophils Absolute 0.0 0.0 - 0.1 K/uL   WBC Morphology MORPHOLOGY UNREMARKABLE    RBC Morphology MORPHOLOGY UNREMARKABLE    Smear Review PLATELETS APPEAR DECREASED    Immature Granulocytes 1 %   Abs Immature Granulocytes 0.06 0.00 - 0.07 K/uL    Comment: Performed at [facility], [address] [place], [place] [address]  Lactic acid, plasma     Status: None   Collection Time: 01/14/23  5:09 PM  Result Value Ref Range   Lactic Acid, Venous 1.7 0.5 - 1.9 mmol/L    Comment: Performed at [facility], [address] [place], [place] [address]   Urinalysis, Routine w reflex microscopic -Urine, Clean Catch     Status: Abnormal   Collection Time: 01/14/23  9:00 PM  Result Value Ref Range   Color, Urine AMBER (A) YELLOW    Comment: BIOCHEMICALS MAY BE AFFECTED BY COLOR   APPearance HAZY (A) CLEAR   Specific Gravity, Urine 1.022 1.005 - 1.030   pH 5.0 5.0 - 8.0   Glucose, UA NEGATIVE NEGATIVE mg/dL   Hgb urine dipstick SMALL (A) NEGATIVE   Bilirubin Urine NEGATIVE NEGATIVE   Ketones, ur NEGATIVE NEGATIVE mg/dL   Protein, ur >=030 (A) NEGATIVE mg/dL   Nitrite NEGATIVE NEGATIVE   Leukocytes,Ua TRACE (A) NEGATIVE   RBC / HPF 0-5 0 - 5 RBC/hpf   WBC, UA 21-50 0 - 5 WBC/hpf   Bacteria, UA RARE (A) NONE SEEN   Squamous Epithelial / HPF 0-5 0 - 5 /HPF   Mucus PRESENT     Comment: Performed at [facility], [address] [place], [place] [address]  Urine Drug Screen, Qualitative ([facility] only)     Status: None   Collection Time: 01/14/23  9:00 PM  Result Value Ref Range   Tricyclic, Ur Screen NONE DETECTED NONE DETECTED   Amphetamines, Ur Screen NONE DETECTED NONE DETECTED   MDMA (Ecstasy)Ur Screen NONE DETECTED NONE DETECTED   Cocaine Metabolite,Ur [place] NONE DETECTED NONE DETECTED   Opiate, Ur Screen NONE DETECTED NONE DETECTED   Phencyclidine (PCP) Ur S NONE DETECTED NONE DETECTED   Cannabinoid 50 Ng, Ur [place] NONE DETECTED NONE DETECTED   Barbiturates, Ur Screen NONE DETECTED NONE DETECTED   Benzodiazepine, Ur Scrn NONE DETECTED NONE DETECTED   Methadone Scn, Ur NONE DETECTED NONE DETECTED  Comment: (NOTE) Tricyclics + metabolites, urine    Cutoff 1000 ng/mL Amphetamines + metabolites, urine  Cutoff 1000 ng/mL MDMA (Ecstasy), urine              Cutoff 500 ng/mL Cocaine Metabolite, urine          Cutoff 300 ng/mL Opiate + metabolites, urine        Cutoff 300 ng/mL Phencyclidine (PCP), urine         Cutoff 25 ng/mL Cannabinoid, urine                 Cutoff 50 ng/mL Barbiturates + metabolites, urine  Cutoff 200  ng/mL Benzodiazepine, urine              Cutoff 200 ng/mL Methadone, urine                   Cutoff 300 ng/mL  The urine drug screen provides only a preliminary, unconfirmed analytical test result and should not be used for non-medical purposes. Clinical consideration and professional judgment should be applied to any positive drug screen result due to possible interfering substances. A more specific alternate chemical method must be used in order to obtain a confirmed analytical result. Gas chromatography / mass spectrometry (GC/MS) is the preferred confirm atory method. Performed at [facility], [address] [place], [place] [address]   Procalcitonin     Status: None   Collection Time: 01/15/23  4:42 AM  Result Value Ref Range   Procalcitonin 0.11 ng/mL    Comment:        Interpretation: PCT (Procalcitonin) <= 0.5 ng/mL: Systemic infection (sepsis) is not likely. Local bacterial infection is possible. (NOTE)       Sepsis PCT Algorithm           Lower Respiratory Tract                                      Infection PCT Algorithm    ----------------------------     ----------------------------         PCT < 0.25 ng/mL                PCT < 0.10 ng/mL          Strongly encourage             Strongly discourage   discontinuation of antibiotics    initiation of antibiotics    ----------------------------     -----------------------------       PCT 0.25 - 0.50 ng/mL            PCT 0.10 - 0.25 ng/mL               OR       >80% decrease in PCT            Discourage initiation of                                            antibiotics      Encourage discontinuation           of antibiotics    ----------------------------     -----------------------------         PCT >= 0.50 ng/mL  PCT 0.26 - 0.50 ng/mL               AND        <80% decrease in PCT             Encourage initiation of                                             antibiotics       Encourage  continuation           of antibiotics    ----------------------------     -----------------------------        PCT >= 0.50 ng/mL                  PCT > 0.50 ng/mL               AND         increase in PCT                  Strongly encourage                                      initiation of antibiotics    Strongly encourage escalation           of antibiotics                                     -----------------------------                                           PCT <= 0.25 ng/mL                                                 OR                                        > 80% decrease in PCT                                      Discontinue / Do not initiate                                             antibiotics  Performed at [facility], [address] [place], [place] [address]   Comprehensive metabolic panel     Status: Abnormal   Collection Time: 01/15/23  4:42 AM  Result Value Ref Range   Sodium 136 135 - 145 mmol/L   Potassium 3.7 3.5 - 5.1 mmol/L   Chloride 96 (L) 98 - 111 mmol/L   CO2 30 22 - 32 mmol/L   Glucose, Bld 85 70 - 99 mg/dL  Comment: Glucose reference range applies only to samples taken after fasting for at least 8 hours.   BUN 20 6 - 20 mg/dL   Creatinine, Ser 1.61 0.61 - 1.24 mg/dL   Calcium 7.9 (L) 8.9 - 10.3 mg/dL   Total Protein 7.0 6.5 - 8.1 g/dL   Albumin 3.1 (L) 3.5 - 5.0 g/dL   AST 36 15 - 41 U/L   ALT 17 0 - 44 U/L   Alkaline Phosphatase 66 38 - 126 U/L   Total Bilirubin 1.6 (H) 0.3 - 1.2 mg/dL   GFR, Estimated >09 >60 mL/min    Comment: (NOTE) Calculated using the CKD-EPI Creatinine Equation (2021)    Anion gap 10 5 - 15    Comment: Performed at [facility], [address] [place], [place] [address]  CBC     Status: Abnormal   Collection Time: 01/15/23  4:42 AM  Result Value Ref Range   WBC 5.1 4.0 - 10.5 K/uL   RBC 3.24 (L) 4.22 - 5.81 MIL/uL   Hemoglobin 11.0 (L) 13.0 - 17.0 g/dL   HCT 81.1 (L) 91.4 - 78.2 %    MCV 104.3 (H) 80.0 - 100.0 fL   MCH 34.0 26.0 - 34.0 pg   MCHC 32.5 30.0 - 36.0 g/dL   RDW 95.6 (H) 21.3 - 08.6 %   Platelets 44 (L) 150 - 400 K/uL    Comment: Immature Platelet Fraction may be clinically indicated, consider ordering this additional test [id number] CONSISTENT WITH PREVIOUS RESULT    nRBC 1.8 (H) 0.0 - 0.2 %    Comment: Performed at [facility], [address] [place], [place] [address]  CBG monitoring, ED     Status: None   Collection Time: 01/15/23  8:04 AM  Result Value Ref Range   Glucose-Capillary 82 70 - 99 mg/dL    Comment: Glucose reference range applies only to samples taken after fasting for at least 8 hours.   Comment 1 Notify RN    Comment 2 Document in Chart   Lactate dehydrogenase     Status: Abnormal   Collection Time: 01/15/23  9:30 AM  Result Value Ref Range   LDH 243 (H) 98 - 192 U/L    Comment: Performed at [facility], [address] [place], [place] [address]    Current Facility-Administered Medications  Medication Dose Route Frequency Provider Last Rate Last Admin   0.9 %  sodium chloride infusion  250 mL Intravenous PRN Arnetha Courser, MD       acetaminophen (TYLENOL) tablet 650 mg  650 mg Oral Q6H PRN Arnetha Courser, MD       Or   acetaminophen (TYLENOL) suppository 650 mg  650 mg Rectal Q6H PRN Arnetha Courser, MD       amiodarone (PACERONE) tablet 200 mg  200 mg Oral Daily Arnetha Courser, MD   200 mg at 01/15/23 0950   Ampicillin-Sulbactam (UNASYN) 3 g in sodium chloride 0.9 % 100 mL IVPB  3 g Intravenous Q6H Arnetha Courser, MD   Stopped at 01/15/23 1037   azithromycin (ZITHROMAX) 500 mg in sodium chloride 0.9 % 250 mL IVPB  500 mg Intravenous Q24H Arnetha Courser, MD   Stopped at 01/15/23 0930   furosemide (LASIX) injection 40 mg  40 mg Intravenous Once Arnetha Courser, MD       levETIRAcetam (KEPPRA) tablet 500 mg  500 mg Oral BID Arnetha Courser, MD   500 mg at 01/15/23 0951   LORazepam (ATIVAN)  injection 0-4 mg  0-4 mg  Intravenous Q6H Delton Prairie, MD   1 mg at 01/15/23 4431   Or   LORazepam (ATIVAN) tablet 0-4 mg  0-4 mg Oral Q6H Delton Prairie, MD   4 mg at 01/15/23 0016   [START ON 01/16/2023] LORazepam (ATIVAN) injection 0-4 mg  0-4 mg Intravenous Q12H Delton Prairie, MD       Or   Melene Muller ON 01/16/2023] LORazepam (ATIVAN) tablet 0-4 mg  0-4 mg Oral Q12H Delton Prairie, MD       magnesium sulfate IVPB 2 g 50 mL  2 g Intravenous Once Arnetha Courser, MD       metoprolol tartrate (LOPRESSOR) tablet 100 mg  100 mg Oral BID Arnetha Courser, MD   100 mg at 01/15/23 0950   nicotine (NICODERM CQ - dosed in mg/24 hours) patch 14 mg  14 mg Transdermal Daily Arnetha Courser, MD   14 mg at 01/15/23 0951   ondansetron (ZOFRAN) tablet 4 mg  4 mg Oral Q6H PRN Arnetha Courser, MD       Or   ondansetron (ZOFRAN) injection 4 mg  4 mg Intravenous Q6H PRN Arnetha Courser, MD       polyethylene glycol (MIRALAX / GLYCOLAX) packet 17 g  17 g Oral Daily PRN Arnetha Courser, MD       sodium chloride flush (NS) 0.9 % injection 3 mL  3 mL Intravenous Q12H Amin, Tilman Neat, MD   3 mL at 01/15/23 0958   sodium chloride flush (NS) 0.9 % injection 3 mL  3 mL Intravenous PRN Arnetha Courser, MD       thiamine (VITAMIN B1) tablet 100 mg  100 mg Oral Daily Delton Prairie, MD   100 mg at 01/15/23 5400   Or   thiamine (VITAMIN B1) injection 100 mg  100 mg Intravenous Daily Delton Prairie, MD   100 mg at 01/14/23 0939   vancomycin (VANCOREADY) IVPB 1250 mg/250 mL  1,250 mg Intravenous Q12H Otelia Sergeant, RPH       Current Outpatient Medications  Medication Sig Dispense Refill   albuterol (VENTOLIN HFA) 108 (90 Base) MCG/ACT inhaler Inhale 1-2 puffs into the lungs as directed.     amiodarone (PACERONE) 200 MG tablet Take 1 tablet (200 mg total) by mouth daily. 30 tablet 0   amLODipine (NORVASC) 2.5 MG tablet Take 2.5 mg by mouth daily.     chlorthalidone (HYGROTON) 25 MG tablet Take 1 tablet (25 mg total) by mouth daily. 30 tablet 0   FLUoxetine (PROZAC) 40 MG  capsule Take 40 mg by mouth every morning.     gabapentin (NEURONTIN) 400 MG capsule Take 400 mg by mouth 3 (three) times daily.     hydrALAZINE (APRESOLINE) 100 MG tablet Take 1 tablet (100 mg total) by mouth 3 (three) times daily. 90 tablet 0   levETIRAcetam (KEPPRA) 500 MG tablet Take 1 tablet (500 mg total) by mouth 2 (two) times daily. 60 tablet 0   lisinopril (ZESTRIL) 40 MG tablet Take 40 mg by mouth daily.     metoprolol tartrate (LOPRESSOR) 50 MG tablet Take 1 tablet (50 mg total) by mouth 2 (two) times daily. 60 tablet 0   rosuvastatin (CRESTOR) 20 MG tablet Take 1 tablet (20 mg total) by mouth at bedtime. 30 tablet 0   valsartan (DIOVAN) 320 MG tablet Take 1 tablet (320 mg total) by mouth every morning. 30 tablet 0   amLODipine (NORVASC) 5 MG tablet Take 1 tablet (5 mg  total) by mouth daily. 30 tablet 0   apixaban (ELIQUIS) 5 MG TABS tablet Take 1 tablet (5 mg total) by mouth 2 (two) times daily. 60 tablet 0   buPROPion (WELLBUTRIN XL) 150 MG 24 hr tablet Take 1 tablet (150 mg total) by mouth daily. 30 tablet 0   furosemide (LASIX) 40 MG tablet Take 1 tablet (40 mg total) by mouth daily. 30 tablet 0   QUEtiapine (SEROQUEL) 100 MG tablet Take 1 tablet (100 mg total) by mouth at bedtime. 30 tablet 0   venlafaxine XR (EFFEXOR-XR) 75 MG 24 hr capsule Take 1 capsule (75 mg total) by mouth daily with breakfast. 30 capsule 0    Musculoskeletal: Strength & Muscle Tone: decreased Gait & Station:  Did not observe Patient leans: N/A            Psychiatric Specialty Exam:  Presentation  General Appearance:  Appropriate for Environment  Eye Contact: Good  Speech: Normal Rate  Speech Volume: Normal  Handedness:Right   Mood and Affect  Mood: Euthymic  Affect: Congruent   Thought Process  Thought Processes: Coherent  Descriptions of Associations:Intact  Orientation:Full (Time, Place and Person)  Thought Content:WDL; Logical  History of  Schizophrenia/Schizoaffective disorder:No  Duration of Psychotic Symptoms:No data recorded Hallucinations:Hallucinations: None  Ideas of Reference:None  Suicidal Thoughts:Suicidal Thoughts: No  Homicidal Thoughts:Homicidal Thoughts: No   Sensorium  Memory: Immediate Good  Judgment: Fair  Insight: Fair   Executive Functions  Concentration: Good  Attention Span: Good  Recall: Good  Fund of Knowledge: Fair  Language: Good   Psychomotor Activity  Psychomotor Activity:Psychomotor Activity: Normal   Assets  Assets: Financial Resources/Insurance; Housing; Intimacy; Social Support   Sleep  Sleep:Sleep: Good   Physical Exam: Physical Exam Constitutional:      Appearance: He is obese. He is ill-appearing.  HENT:     Head: Normocephalic and atraumatic.     Nose: Nose normal.     Mouth/Throat:     Pharynx: Oropharynx is clear.  Cardiovascular:     Rate and Rhythm: Tachycardia present.  Pulmonary:     Comments: Tachypnea  Musculoskeletal:        General: Normal range of motion.     Cervical back: Normal range of motion.     Comments: Generalized weakness   Neurological:     Mental Status: He is oriented to person, place, and time.     Motor: Weakness present.    Review of Systems  Neurological:  Negative for tremors.  Psychiatric/Behavioral:  Positive for depression and substance abuse. Negative for hallucinations and suicidal ideas.    Blood pressure (!) 144/121, pulse 99, temperature 98 F (36.7 C), temperature source Axillary, resp. rate (!) 22, height 5\' 11"  (1.803 m), weight 108.9 kg, SpO2 100 %. Body mass index is 33.47 kg/m.  Treatment Plan Summary: Plan - The patient does not meet criteria for inpatient psychiatric admission.  Patient educated on the importance of medication compliance and potential consequences of noncompliance. Patient encouraged to schedule and attend regular appointments with his outpatient provider for proper mental  health care and medication management. Patient educated impact of alcohol on his mental health and recommended to consider strategies for abstinence.  Reviewed with Dr. Nelson ChimesAmin, EDP.  Disposition: No evidence of imminent risk to self or others at present.   Patient does not meet criteria for psychiatric inpatient admission. Supportive therapy provided about ongoing stressors. Discussed crisis plan, support from social network, calling 911, coming to the Emergency Department, and  calling Suicide Hotline.  Norma FredricksonBennett, Jala Dundon Hayes, NP 01/15/2023 2:50 PM

## 2023-01-15 NOTE — ED Notes (Signed)
Messaged MD about using a telesitter.

## 2023-01-15 NOTE — Consult Note (Signed)
[place]  [phone] [phone]  ID: Andrew Sparks OB: 08/04/1966  MR#: [id number]  [id number]  Patient Care Team: Jodi Marble, MD as PCP - General (Internal Medicine) Perrin Maltese, MD as Referring Physician (Internal Medicine)  CHIEF COMPLAINT: Thrombocytopenia  INTERVAL HISTORY: Patient is a 57 year old male who presented to the emergency room via EMS after lost consciousness and possible seizure-like activity.  Patient has a medical history of chronic alcohol abuse and was acutely intoxicated at that time.  He currently is alert requesting to go home.  Hematology is consulted for thrombocytopenia.  He denies any easy bleeding or bruising.  He has no other neurologic complaints.  He denies any recent fevers or illnesses.  He has a good appetite and denies weight loss.  He has no chest pain, shortness of breath, cough, or hemoptysis.  He has no nausea, vomiting, constipation, or diarrhea.  He has no melena or hematochezia.  He has no urinary complaints.  Patient offers no further specific complaints today.  REVIEW OF SYSTEMS:   Review of Systems  Constitutional: Negative.  Negative for fever, malaise/fatigue and weight loss.  Respiratory: Negative.  Negative for cough, hemoptysis and shortness of breath.   Cardiovascular: Negative.  Negative for chest pain and leg swelling.  Gastrointestinal: Negative.  Negative for abdominal pain.  Genitourinary: Negative.  Negative for dysuria.  Musculoskeletal: Negative.  Negative for back pain.  Skin: Negative.  Negative for rash.  Neurological:  Positive for seizures. Negative for dizziness, focal weakness, weakness and headaches.  Endo/Heme/Allergies:  Does not bruise/bleed easily.  Psychiatric/Behavioral: Negative.  The patient is not nervous/anxious.     As per HPI. Otherwise, a complete review of systems is negative.  PAST MEDICAL HISTORY: Past Medical History:  Diagnosis Date    A-fib ([place])    Anxiety    CHF (congestive heart failure) (HCC)    Depression    Hypersomnia with sleep apnea    Hypertension    Morbid obesity ([place])    NSTEMI (non-ST elevated myocardial infarction) ([place]) 2005   Obesity hypoventilation syndrome (HCC)    Shortness of breath dyspnea    Sleep apnea    Stroke ([place])    V-tach ([place])     PAST SURGICAL HISTORY: Past Surgical History:  Procedure Laterality Date   LEFT HEART CATH AND CORONARY ANGIOGRAPHY Right 09/10/2018   Procedure: LEFT HEART CATH AND CORONARY ANGIOGRAPHY with possible percutaneous intervention;  Surgeon: Dionisio David, MD;  Location: [facility];  Service: Cardiovascular;  Laterality: Right;   TRACHEOSTOMY      FAMILY HISTORY: Family History  Problem Relation Age of Onset   Heart disease Father    Diabetes type II Mother    Breast cancer Mother     ADVANCED DIRECTIVES (Y/N):  @ADVDIR$ @  HEALTH MAINTENANCE: Social History   Tobacco Use   Smoking status: Every Day    Packs/day: 0.25    Years: 28.00    Total pack years: 7.00    Types: Cigarettes   Smokeless tobacco: Never  Vaping Use   Vaping Use: Never used  Substance Use Topics   Alcohol use: Yes    Alcohol/week: 4.0 standard drinks of alcohol    Types: 4 Cans of beer per week    Comment: 1/5 of whiskey dailty for last 5 days   Drug use: Yes    Types: Marijuana     Colonoscopy:  PAP:  Bone density:  Lipid panel:  No Known  Allergies  Current Facility-Administered Medications  Medication Dose Route Frequency Provider Last Rate Last Admin   0.9 %  sodium chloride infusion  250 mL Intravenous PRN Lorella Nimrod, MD       acetaminophen (TYLENOL) tablet 650 mg  650 mg Oral Q6H PRN Lorella Nimrod, MD       Or   acetaminophen (TYLENOL) suppository 650 mg  650 mg Rectal Q6H PRN Lorella Nimrod, MD       amiodarone (PACERONE) tablet 200 mg  200 mg Oral Daily Lorella Nimrod, MD   200 mg at 01/15/23 0950   amLODipine (NORVASC) tablet 5 mg  5 mg  Oral Daily Lorella Nimrod, MD       Ampicillin-Sulbactam (UNASYN) 3 g in sodium chloride 0.9 % 100 mL IVPB  3 g Intravenous Q6H Lorella Nimrod, MD   Stopped at 01/15/23 1037   azithromycin (ZITHROMAX) 500 mg in sodium chloride 0.9 % 250 mL IVPB  500 mg Intravenous Q24H Lorella Nimrod, MD   Stopped at 01/15/23 0930   furosemide (LASIX) injection 40 mg  40 mg Intravenous Once Lorella Nimrod, MD       levETIRAcetam (KEPPRA) tablet 500 mg  500 mg Oral BID Lorella Nimrod, MD   500 mg at 01/15/23 0951   LORazepam (ATIVAN) injection 0-4 mg  0-4 mg Intravenous Q6H Vladimir Crofts, MD   1 mg at 01/15/23 [id number]   Or   LORazepam (ATIVAN) tablet 0-4 mg  0-4 mg Oral Q6H Vladimir Crofts, MD   4 mg at 01/15/23 0016   [START ON 01/16/2023] LORazepam (ATIVAN) injection 0-4 mg  0-4 mg Intravenous Q12H Vladimir Crofts, MD       Or   Derrill Memo ON 01/16/2023] LORazepam (ATIVAN) tablet 0-4 mg  0-4 mg Oral Q12H Vladimir Crofts, MD       magnesium sulfate IVPB 2 g 50 mL  2 g Intravenous Once Lorella Nimrod, MD       metoprolol tartrate (LOPRESSOR) tablet 100 mg  100 mg Oral BID Lorella Nimrod, MD   100 mg at 01/15/23 0950   nicotine (NICODERM CQ - dosed in mg/24 hours) patch 14 mg  14 mg Transdermal Daily Lorella Nimrod, MD   14 mg at 01/15/23 0951   ondansetron (ZOFRAN) tablet 4 mg  4 mg Oral Q6H PRN Lorella Nimrod, MD       Or   ondansetron (ZOFRAN) injection 4 mg  4 mg Intravenous Q6H PRN Lorella Nimrod, MD       polyethylene glycol (MIRALAX / GLYCOLAX) packet 17 g  17 g Oral Daily PRN Lorella Nimrod, MD       sodium chloride flush (NS) 0.9 % injection 3 mL  3 mL Intravenous Q12H Amin, Soundra Pilon, MD   3 mL at 01/15/23 0958   sodium chloride flush (NS) 0.9 % injection 3 mL  3 mL Intravenous PRN Lorella Nimrod, MD       thiamine (VITAMIN B1) tablet 100 mg  100 mg Oral Daily Vladimir Crofts, MD   100 mg at 01/15/23 [id number]   Or   thiamine (VITAMIN B1) injection 100 mg  100 mg Intravenous Daily Vladimir Crofts, MD   100 mg at 01/14/23 0939   vancomycin  (VANCOREADY) IVPB 1250 mg/250 mL  1,250 mg Intravenous Q12H Renda Rolls, RPH       Current Outpatient Medications  Medication Sig Dispense Refill   albuterol (VENTOLIN HFA) 108 (90 Base) MCG/ACT inhaler Inhale 1-2 puffs into the lungs as directed.  amiodarone (PACERONE) 200 MG tablet Take 1 tablet (200 mg total) by mouth daily. 30 tablet 0   amLODipine (NORVASC) 2.5 MG tablet Take 2.5 mg by mouth daily.     chlorthalidone (HYGROTON) 25 MG tablet Take 1 tablet (25 mg total) by mouth daily. 30 tablet 0   FLUoxetine (PROZAC) 40 MG capsule Take 40 mg by mouth every morning.     gabapentin (NEURONTIN) 400 MG capsule Take 400 mg by mouth 3 (three) times daily.     hydrALAZINE (APRESOLINE) 100 MG tablet Take 1 tablet (100 mg total) by mouth 3 (three) times daily. 90 tablet 0   levETIRAcetam (KEPPRA) 500 MG tablet Take 1 tablet (500 mg total) by mouth 2 (two) times daily. 60 tablet 0   lisinopril (ZESTRIL) 40 MG tablet Take 40 mg by mouth daily.     metoprolol tartrate (LOPRESSOR) 50 MG tablet Take 1 tablet (50 mg total) by mouth 2 (two) times daily. 60 tablet 0   rosuvastatin (CRESTOR) 20 MG tablet Take 1 tablet (20 mg total) by mouth at bedtime. 30 tablet 0   valsartan (DIOVAN) 320 MG tablet Take 1 tablet (320 mg total) by mouth every morning. 30 tablet 0   amLODipine (NORVASC) 5 MG tablet Take 1 tablet (5 mg total) by mouth daily. 30 tablet 0   apixaban (ELIQUIS) 5 MG TABS tablet Take 1 tablet (5 mg total) by mouth 2 (two) times daily. 60 tablet 0   buPROPion (WELLBUTRIN XL) 150 MG 24 hr tablet Take 1 tablet (150 mg total) by mouth daily. 30 tablet 0   furosemide (LASIX) 40 MG tablet Take 1 tablet (40 mg total) by mouth daily. 30 tablet 0   QUEtiapine (SEROQUEL) 100 MG tablet Take 1 tablet (100 mg total) by mouth at bedtime. 30 tablet 0   venlafaxine XR (EFFEXOR-XR) 75 MG 24 hr capsule Take 1 capsule (75 mg total) by mouth daily with breakfast. 30 capsule 0    OBJECTIVE: Vitals:    01/15/23 1200 01/15/23 1300  BP: (!) 144/121 (!) 150/124  Pulse: 99 91  Resp: (!) 22 (!) 30  Temp:    SpO2: 100% 95%     Body mass index is 33.47 kg/m.    ECOG FS:1 - Symptomatic but completely ambulatory  General: Well-developed, well-nourished, no acute distress. Eyes: Pink conjunctiva, anicteric sclera. HEENT: Normocephalic, moist mucous membranes. Lungs: No audible wheezing or coughing. Heart: Regular rate and rhythm. Abdomen: Soft, nontender, no obvious distention. Musculoskeletal: No edema, cyanosis, or clubbing. Neuro: Alert, answering all questions appropriately. Cranial nerves grossly intact. Skin: No rashes or petechiae noted. Psych: Normal affect. Lymphatics: No cervical, calvicular, axillary or inguinal LAD.   LAB RESULTS:  Lab Results  Component Value Date   NA 136 01/15/2023   K 3.7 01/15/2023   CL 96 (L) 01/15/2023   CO2 30 01/15/2023   GLUCOSE 85 01/15/2023   BUN 20 01/15/2023   CREATININE 0.95 01/15/2023   CALCIUM 7.9 (L) 01/15/2023   PROT 7.0 01/15/2023   ALBUMIN 3.1 (L) 01/15/2023   AST 36 01/15/2023   ALT 17 01/15/2023   ALKPHOS 66 01/15/2023   BILITOT 1.6 (H) 01/15/2023   GFRNONAA >60 01/15/2023   GFRAA >60 05/12/2020    Lab Results  Component Value Date   WBC 5.1 01/15/2023   NEUTROABS 4.8 01/14/2023   HGB 11.0 (L) 01/15/2023   HCT 33.8 (L) 01/15/2023   MCV 104.3 (H) 01/15/2023   PLT 44 (L) 01/15/2023     STUDIES:  CT Chest Wo Contrast  Result Date: 01/14/2023 CLINICAL DATA:  Shortness of breath with cough. EXAM: CT CHEST WITHOUT CONTRAST TECHNIQUE: Multidetector CT imaging of the chest was performed following the standard protocol without IV contrast. RADIATION DOSE REDUCTION: This exam was performed according to the departmental dose-optimization program which includes automated exposure control, adjustment of the mA and/or kV according to patient size and/or use of iterative reconstruction technique. COMPARISON:  05/12/2020 FINDINGS:  Cardiovascular: The heart is markedly enlarged. Coronary artery calcification is evident. Mild atherosclerotic calcification is noted in the wall of the thoracic aorta. Enlargement of the pulmonary outflow tract/main pulmonary arteries suggests pulmonary arterial hypertension. Mediastinum/Nodes: Scattered upper normal mediastinal lymph nodes evident. No evidence for gross hilar lymphadenopathy although assessment is limited by the lack of intravenous contrast on the current study. The esophagus has normal imaging features. There is no axillary lymphadenopathy. Lungs/Pleura: Interlobular septal thickening noted in the upper lobes bilaterally. Fine detail of lung parenchyma in the lower chest bilaterally obscured by breathing motion during image acquisition. Within this limitation, there is consolidative airspace disease in the posterior right middle lobe and most prominently in the inferior right lower lobe. Similar but more modest airspace disease identified in the posterior left costophrenic sulcus. No pleural effusion. No suspicious pulmonary nodule or mass. Upper Abdomen: The liver shows diffusely decreased attenuation suggesting fat deposition. Tiny layering calcified gallstones evident. Musculoskeletal: No worrisome lytic or sclerotic osseous abnormality. Mild superior endplate compression deformity noted at L2. IMPRESSION: 1. Consolidative airspace disease in the posterior right middle lobe and most prominently in the inferior right lower lobe. Similar but more modest airspace disease identified in the posterior left costophrenic sulcus. Imaging features are compatible with multifocal pneumonia. Aspiration could have this appearance in the appropriate clinical setting. Dependent edema considered a less likely possibility by imaging. 2. Marked cardiomegaly with interlobular septal thickening in the upper lobes bilaterally. 3. Enlargement of the pulmonary outflow tract/main pulmonary arteries compatible with  pulmonary arterial hypertension. 4. Hepatic steatosis. 5. Cholelithiasis. 6.  Aortic Atherosclerosis (ICD10-I70.0). Electronically Signed   By: Misty Stanley M.D.   On: 01/14/2023 06:10   DG Chest Portable 1 View  Result Date: 01/14/2023 CLINICAL DATA:  seizure vs syncope. hypoxic CHF patient. eval infiltrate, signs of aspiration EXAM: PORTABLE CHEST 1 VIEW.  Patient is rotated COMPARISON:  Chest x-ray 10/29/2022 FINDINGS: The heart and mediastinal contours are unchanged. Slightly prominent left hilar vasculature which may be due to patient rotation. Right base and costophrenic angle collimated off view. No definite focal consolidation. Question slightly increased markings. No significant pleural effusion. No pneumothorax. No acute osseous abnormality. IMPRESSION: Limited evaluation due to patient rotation. Recommend repeat PA and lateral view of the chest. Electronically Signed   By: Iven Finn M.D.   On: 01/14/2023 03:40   CT HEAD WO CONTRAST (5MM)  Result Date: 01/14/2023 CLINICAL DATA:  fall, syncope vs seizure EXAM: CT HEAD WITHOUT CONTRAST TECHNIQUE: Contiguous axial images were obtained from the base of the skull through the vertex without intravenous contrast. RADIATION DOSE REDUCTION: This exam was performed according to the departmental dose-optimization program which includes automated exposure control, adjustment of the mA and/or kV according to patient size and/or use of iterative reconstruction technique. COMPARISON:  CT head 10/29/2022, CT head 04/20/2020 FINDINGS: Brain: Cerebral ventricle sizes are concordant with the degree of cerebral volume loss. Similar-appearing right frontotemporal and right cerebellar encephalomalacia. Patchy and confluent areas of decreased attenuation are noted throughout the deep and periventricular white matter of  the cerebral hemispheres bilaterally, compatible with chronic microvascular ischemic disease. No evidence of large-territorial acute infarction. No  parenchymal hemorrhage. No mass lesion. No extra-axial collection. No mass effect or midline shift. No hydrocephalus. Basilar cisterns are patent. Vascular: No hyperdense vessel. Skull: No acute fracture or focal lesion. Sinuses/Orbits: Left maxillary sinus mucosal thickening. Otherwise paranasal sinuses and mastoid air cells are clear. The orbits are unremarkable. Other: None. IMPRESSION: No acute intracranial abnormality. Electronically Signed   By: Iven Finn M.D.   On: 01/14/2023 03:39    ASSESSMENT: Thrombocytopenia.  PLAN:    Thrombocytopenia: Likely secondary to heavy alcohol use and acute intoxication, although patient had normal platelet count in November 2023.  CT scan of the abdomen on November 01, 2022 did not report any splenomegaly.  B12 levels are within normal limits.  Will order folate, iron panel, and antiplatelet antibodies for completeness. Heavy alcohol use, suicidal ideation: Psychiatry has been consulted.  Appreciate consult, will follow.  Lloyd Huger, MD   01/15/2023 3:45 PM

## 2023-01-15 NOTE — ED Notes (Signed)
Patient given water as requested.

## 2023-01-16 ENCOUNTER — Inpatient Hospital Stay: Payer: Medicare HMO

## 2023-01-16 ENCOUNTER — Inpatient Hospital Stay
Admit: 2023-01-16 | Discharge: 2023-01-16 | Disposition: A | Discharge status: Home / Self Care | Payer: Medicare HMO | Attending: Internal Medicine | Admitting: Internal Medicine

## 2023-01-16 DIAGNOSIS — J9621 Acute and chronic respiratory failure with hypoxia: Secondary | ICD-10-CM | POA: Diagnosis not present

## 2023-01-16 DIAGNOSIS — R569 Unspecified convulsions: Secondary | ICD-10-CM | POA: Diagnosis not present

## 2023-01-16 DIAGNOSIS — D696 Thrombocytopenia, unspecified: Secondary | ICD-10-CM | POA: Diagnosis not present

## 2023-01-16 DIAGNOSIS — A419 Sepsis, unspecified organism: Secondary | ICD-10-CM | POA: Diagnosis not present

## 2023-01-16 LAB — BASIC METABOLIC PANEL
Anion gap: 9 (ref 5–15)
BUN: 21 mg/dL — ABNORMAL HIGH (ref 6–20)
CO2: 30 mmol/L (ref 22–32)
Calcium: 8 mg/dL — ABNORMAL LOW (ref 8.9–10.3)
Chloride: 95 mmol/L — ABNORMAL LOW (ref 98–111)
Creatinine, Ser: 0.95 mg/dL (ref 0.61–1.24)
GFR, Estimated: >60 mL/min (ref >60)
Glucose, Bld: 97 mg/dL (ref 70–99)
Potassium: 3.9 mmol/L (ref 3.5–5.1)
Sodium: 134 mmol/L — ABNORMAL LOW (ref 135–145)

## 2023-01-16 LAB — LEGIONELLA PNEUMOPHILA SEROGP 1 UR AG: L. pneumophila Serogp 1 Ur Ag: NEGATIVE

## 2023-01-16 LAB — FOLATE RBC
Folate, Hemolysate: 388 ng/mL
Folate, Hemolysate: 368 ng/mL
Folate, RBC: 1205 ng/mL (ref >498)
Folate, RBC: 1172 ng/mL (ref >498)
Hematocrit: 32.2 % — ABNORMAL LOW (ref 37.5–51.0)
Hematocrit: 31.4 % — ABNORMAL LOW (ref 37.5–51.0)

## 2023-01-16 LAB — CBC
HCT: 33.5 % — ABNORMAL LOW (ref 39.0–52.0)
Hemoglobin: 10.8 g/dL — ABNORMAL LOW (ref 13.0–17.0)
MCH: 33.6 pg (ref 26.0–34.0)
MCHC: 32.2 g/dL (ref 30.0–36.0)
MCV: 104.4 fL — ABNORMAL HIGH (ref 80.0–100.0)
Platelets: 45 10*3/uL — ABNORMAL LOW (ref 150–400)
RBC: 3.21 MIL/uL — ABNORMAL LOW (ref 4.22–5.81)
RDW: 16.9 % — ABNORMAL HIGH (ref 11.5–15.5)
WBC: 5.4 10*3/uL (ref 4.0–10.5)
nRBC: 2.8 % — ABNORMAL HIGH (ref 0.0–0.2)

## 2023-01-16 LAB — TSH: TSH: 1.65 u[IU]/mL (ref 0.350–4.500)

## 2023-01-16 LAB — PARVOVIRUS B19 ANTIBODY, IGG AND IGM
Parovirus B19 IgG Abs: 6.3 index — ABNORMAL HIGH (ref 0.0–0.8)
Parovirus B19 IgM Abs: 0.1 index (ref 0.0–0.8)

## 2023-01-16 LAB — CBG MONITORING, ED: Glucose-Capillary: 91 mg/dL (ref 70–99)

## 2023-01-16 LAB — PROCALCITONIN: Procalcitonin: <0.1 ng/mL

## 2023-01-16 LAB — MAGNESIUM: Magnesium: 1.6 mg/dL — ABNORMAL LOW (ref 1.7–2.4)

## 2023-01-16 MED ORDER — IPRATROPIUM-ALBUTEROL 0.5-2.5 (3) MG/3ML IN SOLN
3.0000 mL | Freq: Four times a day (QID) | RESPIRATORY_TRACT | Status: DC
  Administered 2023-01-16 – 2023-01-18 (×9): 3 mL via RESPIRATORY_TRACT
  Filled 2023-01-16 (×10): qty 3

## 2023-01-16 MED ORDER — IOHEXOL 350 MG/ML SOLN
75.0000 mL | Freq: Once | INTRAVENOUS | Status: AC | PRN
  Administered 2023-01-16: 75 mL via INTRAVENOUS

## 2023-01-16 MED ORDER — MAGNESIUM SULFATE 4 GM/100ML IV SOLN
4.0000 g | Freq: Once | INTRAVENOUS | Status: AC
  Administered 2023-01-16: 4 g via INTRAVENOUS
  Filled 2023-01-16: qty 100

## 2023-01-16 MED ORDER — FOLIC ACID 1 MG PO TABS
1.0000 mg | ORAL_TABLET | Freq: Every day | ORAL | Status: DC
  Administered 2023-01-16 – 2023-01-24 (×9): 1 mg via ORAL
  Filled 2023-01-16 (×9): qty 1

## 2023-01-16 MED ORDER — MELATONIN 5 MG PO TABS
5.0000 mg | ORAL_TABLET | Freq: Every day | ORAL | Status: DC
  Administered 2023-01-16 – 2023-01-18 (×3): 5 mg via ORAL
  Filled 2023-01-16 (×3): qty 1

## 2023-01-16 MED ORDER — FUROSEMIDE 40 MG PO TABS
40.0000 mg | ORAL_TABLET | Freq: Every day | ORAL | Status: DC
  Administered 2023-01-16 – 2023-01-24 (×9): 40 mg via ORAL
  Filled 2023-01-16 (×9): qty 1

## 2023-01-16 MED ORDER — LACTATED RINGERS IV SOLN: INTRAVENOUS | Status: AC

## 2023-01-16 MED ORDER — LORAZEPAM 1 MG PO TABS
1.0000 mg | ORAL_TABLET | Freq: Once | ORAL | Status: AC
  Administered 2023-01-16: 1 mg via ORAL
  Filled 2023-01-16: qty 1

## 2023-01-16 MED ORDER — TECHNETIUM TC 99M MEBROFENIN IV KIT
5.0000 | PACK | Freq: Once | INTRAVENOUS | Status: AC | PRN
  Administered 2023-01-16: 5.52 via INTRAVENOUS

## 2023-01-16 NOTE — ED Notes (Signed)
Ultrasound at bedside

## 2023-01-16 NOTE — ED Notes (Signed)
Pt asleep but awoke to take nighttime meds and tolerated well.

## 2023-01-16 NOTE — TOC Initial Note (Signed)
Transition of Care [facility]) - Initial/Assessment Note    Patient Details  Name: Andrew Sparks MRN: [id number] Date of Birth: 1966/08/26  [facility]) CM/SW Contact:    Shelbie Hutching, RN Phone Number: 01/16/2023, 11:27 AM  Clinical Narrative:                 Patient admitted to the hospital with acute on chronic respiratory failure, history of CHF and COPD on chronic oxygen at home.  Patient has a history of heavy ETOH use.  Patient was IVC'd because he wanted to leave the hospital and for suicidal ideation.  Psych cleared but IVC will remain in place until medically cleared.  RNCM me with patient at the bedside in the emergency room.  Patient reports that he lives with his girlfriend, [name], and that he is independent at home.  At discharge [name] will be able to pick him up and transport him home.  Patient agrees to [facility] PT and OT and will benefit from nursing as well.  Referral for [facility] given to [place] with [facility].  Patient has DME at home none recommended by therapy.    TOC will cont to follow.  Will put SA resources on discharge paperwork.    Expected Discharge Plan: [place] Barriers to Discharge: Continued Medical Work up   Patient Goals and CMS Choice Patient states their goals for this hospitalization and ongoing recovery are:: patient wants to go home CMS Medicare.gov Compare Post Acute Care list provided to:: Patient Choice offered to / list presented to : Patient      Expected Discharge Plan and Services   Discharge Planning Services: CM Consult Post Acute Care Choice: [place] arrangements for the past 2 months: Single Family Home                 DME Arranged: N/A DME Agency: NA       HH Arranged: RN, PT, OT HH Agency: [place] Date [place]: 01/16/23 Time [place]: 1127 Representative spoke with at [place]: [place]  Prior Living Arrangements/Services Living arrangements  for the past 2 months: [place] with:: Significant Other Patient language and need for interpreter reviewed:: Yes Do you feel safe going back to the place where you live?: Yes      Need for Family Participation in Patient Care: Yes (Comment) Care giver support system in place?: Yes (comment) Current home services: DME Criminal Activity/Legal Involvement Pertinent to Current Situation/Hospitalization: No - Comment as needed  Activities of Daily Living      Permission Sought/Granted                  Emotional Assessment Appearance:: Appears older than stated age Attitude/Demeanor/Rapport: Lethargic Affect (typically observed): Accepting Orientation: : Oriented to Self, Oriented to Place, Oriented to Situation Alcohol / Substance Use: Alcohol Use Psych Involvement: Yes (comment)  Admission diagnosis:  Acute on chronic respiratory failure with hypoxia ([place]) [J96.21] Patient Active Problem List   Diagnosis Date Noted   Suicidal ideation 01/15/2023   Acute on chronic respiratory failure with hypoxia ([place]) 01/14/2023   Sepsis with acute hypoxic respiratory failure without septic shock ([place]) 01/14/2023   Thrombocytopenia ([place]) [id number]   Alcoholic intoxication with complication ([place]) [id number]   Hypertensive urgency 10/29/2022   Alcohol use disorder 10/29/2022   Hemiparesis affecting left side as late effect of cerebrovascular accident (CVA) ([place]) 10/29/2022   Seizure-like activity ([place]) 10/29/2022  Elevated troponin 10/29/2022   Hypotension 07/02/2022   Dyslipidemia 07/02/2022   Major depressive disorder, recurrent severe without psychotic features ([place]) 06/30/2022   Paroxysmal atrial fibrillation (HCC)    Vitamin B12 deficiency    AKI (acute kidney injury) ([place]) 03/28/2021   Essential hypertension 03/28/2021   Hyponatremia 04/20/2020   Chronic diastolic CHF (congestive heart failure) (HCC)    Atrial fibrillation, chronic ([place])    COPD with acute  exacerbation ([place])    Depression    Alcohol abuse    Tobacco abuse    CHF (congestive heart failure) ([place]) 07/17/2017   Panic disorder 01/18/2017   Chest pain 01/17/2017   Obstructive sleep apnea syndrome 02/01/2016   PCP:  Jodi Marble, MD Pharmacy:   [facility] [address]), [place]) [place] [address] Phone: [phone] Fax: [phone]     Social Determinants of Health (SDOH) Social History: SDOH Screenings   Alcohol Screen: High Risk (06/30/2022)  Tobacco Use: High Risk (01/14/2023)   SDOH Interventions:     Readmission Risk Interventions     No data to display

## 2023-01-16 NOTE — Assessment & Plan Note (Signed)
Patient was on multiple antihypertensives at home which he was not taking it for the past couple of weeks.  Blood pressure now within goal after restarting home amlodipine. -Continue home amlodipine -We can restart ACE inhibitor if needed as AKI has been resolved. -Metoprolol dose was increased and restarted home Lasix

## 2023-01-16 NOTE — ED Notes (Signed)
Assumed care of patient. Patient is asleep in bed.

## 2023-01-16 NOTE — Assessment & Plan Note (Signed)
Markedly elevated ethanol levels on admission.  Patient uses liquor regularly. Counseling was provided. -CIWA protocol with Ativan

## 2023-01-16 NOTE — Assessment & Plan Note (Addendum)
Patient met severe sepsis criteria with tachycardia, tachypnea, imaging concerning for pneumonia.  Elevated lactic acid at 3.4>>2.9>>1.7 and AKI.  Respiratory panel negative for COVID, influenza and RSV.  Patient was started on Unasyn for concern of aspiration most likely secondary to alcohol intoxication. 1 set of blood cultures with MRSE-most likely contaminant but vancomycin was also added.  Repeat blood cultures remain negative so vancomycin was discontinued. -Continue Unasyn and Zithromax-completed a 5-day course All cultures to date have been negative except for a Parvovirus B19 IgG titer.

## 2023-01-16 NOTE — ED Notes (Signed)
Pt incontinent of urine purewick was not connected back into place. Pt cleaned up, sheets changed, new purewick placed. Pt repositioned into bed more comfortably. Pt attempting to eat dinner meal on tray.

## 2023-01-16 NOTE — ED Notes (Signed)
Pt a lot more alert and oriented from beginning of day. Pt was hard to arouse majority of day since cleaning patient up he has ate food and requested more liquids to drink and eat and in addition to be able to watch the television.

## 2023-01-16 NOTE — Assessment & Plan Note (Signed)
Echo done in November 2023 with normal EF and grade 1 diastolic dysfunction. BNP of 227.  1+ lower extremity edema.  Patient received 1 dose of IV Lasix -Restart home Lasix at 40 mg daily p.o. -Avoid IV fluid. -Continue to monitor

## 2023-01-16 NOTE — Consult Note (Signed)
[facility] Psych ED Progress Note  01/16/2023 12:31 PM Andrew Sparks  MRN:  [id number]   Method of visit?: Face to Face   Subjective:  "I want to go home".   Patient initially presented to the ED for evaluation of seizures and verbalized suicidal ideation with no plan to nursing staff. Upon evaluation today, the patient reports increased anxiety and difficulty sleeping last night, specifically having trouble falling asleep. He denies experiencing bad dreams or nightmares. On chart review, it was noted a one time dose of Ativan 1 mg and Melatonin 5 mg was administered to aid in anxiety relief and sleep. The patient reports that he finds sleeping in a recliner more comfortable, as it helps him to breathe better. The patient denies suicidal or homicidal ideations.He denies experiencing auditory or visual hallucinations. The patient expresses a desire to go home but understands the need for medical clearance before doing so. He does not appear to be responding to internal or external stimuli, nor any delusional thinking was noted during the encounter. UDS 419 on admission; CIWA protocol with Ativan remains in progress.   Plan - The patient does not meet criteria for inpatient psychiatric admission. Patient educated on the importance of medication compliance and potential consequences of noncompliance. Patient encouraged to schedule and attend regular appointments with his outpatient provider for proper mental health care and medication management. Patient educated impact of alcohol on his mental health and recommended to consider strategies for abstinence. Will add Melatonin 5 mg at bedtime to aid in sleep. Reviewed with Dr. Reesa Chew, [place].    Principal Problem: Acute on chronic respiratory failure with hypoxia (HCC) Diagnosis:  Principal Problem:   Acute on chronic respiratory failure with hypoxia (HCC) Active Problems:   Chronic diastolic CHF (congestive heart failure) (HCC)   Alcohol abuse   Tobacco  abuse   AKI (acute kidney injury) ([place])   Essential hypertension   Paroxysmal atrial fibrillation (HCC)   Major depressive disorder, recurrent severe without psychotic features ([place])   Dyslipidemia   Seizure-like activity ([place])   Sepsis with acute hypoxic respiratory failure without septic shock (HCC)   Thrombocytopenia (HCC)   Alcoholic intoxication with complication ([place])  Total Time spent with patient: 30 minutes  Past Psychiatric History:  Depression, alcohol abuse, anxiety. History of suicidal ideation and was admitted to geropsych for the same last year.   Past Medical History:  Past Medical History:  Diagnosis Date   A-fib ([place])    Anxiety    CHF (congestive heart failure) (HCC)    Depression    Hypersomnia with sleep apnea    Hypertension    Morbid obesity ([place])    NSTEMI (non-ST elevated myocardial infarction) ([place]) 2005   Obesity hypoventilation syndrome (HCC)    Shortness of breath dyspnea    Sleep apnea    Stroke ([place])    V-tach [facility])     Past Surgical History:  Procedure Laterality Date   LEFT HEART CATH AND CORONARY ANGIOGRAPHY Right 09/10/2018   Procedure: LEFT HEART CATH AND CORONARY ANGIOGRAPHY with possible percutaneous intervention;  Surgeon: Dionisio David, MD;  Location: [facility];  Service: Cardiovascular;  Laterality: Right;   TRACHEOSTOMY     Family History:  Family History  Problem Relation Age of Onset   Heart disease Father    Diabetes type II Mother    Breast cancer Mother    Family Psychiatric  History:  Unremarkable   Social History:  Social History   Substance and  Sexual Activity  Alcohol Use Yes   Alcohol/week: 4.0 standard drinks of alcohol   Types: 4 Cans of beer per week   Comment: 1/5 of whiskey dailty for last 5 days     Social History   Substance and Sexual Activity  Drug Use Yes   Types: Marijuana    Social History   Socioeconomic History   Marital status: Single    Spouse name: Not on file   Number of  children: Not on file   Years of education: Not on file   Highest education level: Not on file  Occupational History   Occupation: disabled  Tobacco Use   Smoking status: Every Day    Packs/day: 0.25    Years: 28.00    Total pack years: 7.00    Types: Cigarettes   Smokeless tobacco: Never  Vaping Use   Vaping Use: Never used  Substance and Sexual Activity   Alcohol use: Yes    Alcohol/week: 4.0 standard drinks of alcohol    Types: 4 Cans of beer per week    Comment: 1/5 of whiskey dailty for last 5 days   Drug use: Yes    Types: Marijuana   Sexual activity: Yes  Other Topics Concern   Not on file  Social History Narrative   Not on file   Social Determinants of Health   Financial Resource Strain: Not on file  Food Insecurity: Not on file  Transportation Needs: Not on file  Physical Activity: Not on file  Stress: Not on file  Social Connections: Not on file    Sleep: Poor  Appetite:  Good  Current Medications: Current Facility-Administered Medications  Medication Dose Route Frequency Provider Last Rate Last Admin   0.9 %  sodium chloride infusion  250 mL Intravenous PRN Lorella Nimrod, MD       acetaminophen (TYLENOL) tablet 650 mg  650 mg Oral Q6H PRN Lorella Nimrod, MD       Or   acetaminophen (TYLENOL) suppository 650 mg  650 mg Rectal Q6H PRN Lorella Nimrod, MD       amiodarone (PACERONE) tablet 200 mg  200 mg Oral Daily Lorella Nimrod, MD   200 mg at 01/16/23 0920   amLODipine (NORVASC) tablet 5 mg  5 mg Oral Daily Lorella Nimrod, MD   5 mg at 01/16/23 0920   Ampicillin-Sulbactam (UNASYN) 3 g in sodium chloride 0.9 % 100 mL IVPB  3 g Intravenous Q6H Lorella Nimrod, MD   Stopped at 01/16/23 0711   azithromycin (ZITHROMAX) 500 mg in sodium chloride 0.9 % 250 mL IVPB  500 mg Intravenous Q24H Lorella Nimrod, MD   Stopped at [id number]   folic acid (FOLVITE) tablet 1 mg  1 mg Oral Daily Lorella Nimrod, MD   1 mg at 01/16/23 0920   furosemide (LASIX) tablet 40 mg  40 mg  Oral Daily Lorella Nimrod, MD       gabapentin (NEURONTIN) capsule 400 mg  400 mg Oral TID Lorella Nimrod, MD   400 mg at 01/16/23 0920   levETIRAcetam (KEPPRA) tablet 500 mg  500 mg Oral BID Lorella Nimrod, MD   500 mg at 01/16/23 0920   LORazepam (ATIVAN) injection 0-4 mg  0-4 mg Intravenous Q12H Vladimir Crofts, MD       Or   LORazepam (ATIVAN) tablet 0-4 mg  0-4 mg Oral Q12H Vladimir Crofts, MD       melatonin tablet 5 mg  5 mg Oral QHS Ricarda Atayde H,  NP       metoprolol tartrate (LOPRESSOR) tablet 100 mg  100 mg Oral BID Lorella Nimrod, MD   100 mg at 01/16/23 0920   nicotine (NICODERM CQ - dosed in mg/24 hours) patch 14 mg  14 mg Transdermal Daily Lorella Nimrod, MD   14 mg at 01/16/23 0921   ondansetron (ZOFRAN) tablet 4 mg  4 mg Oral Q6H PRN Lorella Nimrod, MD       Or   ondansetron (ZOFRAN) injection 4 mg  4 mg Intravenous Q6H PRN Lorella Nimrod, MD   4 mg at 01/16/23 0759   polyethylene glycol (MIRALAX / GLYCOLAX) packet 17 g  17 g Oral Daily PRN Lorella Nimrod, MD       sodium chloride flush (NS) 0.9 % injection 3 mL  3 mL Intravenous Q12H Lorella Nimrod, MD   3 mL at 01/16/23 1009   sodium chloride flush (NS) 0.9 % injection 3 mL  3 mL Intravenous PRN Lorella Nimrod, MD       thiamine (VITAMIN B1) tablet 100 mg  100 mg Oral Daily Vladimir Crofts, MD   100 mg at 01/16/23 0920   Or   thiamine (VITAMIN B1) injection 100 mg  100 mg Intravenous Daily Vladimir Crofts, MD   100 mg at 01/14/23 0939   vancomycin (VANCOREADY) IVPB 1250 mg/250 mL  1,250 mg Intravenous Q12H Renda Rolls, RPH   Stopped at 01/16/23 [id number]   Current Outpatient Medications  Medication Sig Dispense Refill   albuterol (VENTOLIN HFA) 108 (90 Base) MCG/ACT inhaler Inhale 1-2 puffs into the lungs as directed.     amiodarone (PACERONE) 200 MG tablet Take 1 tablet (200 mg total) by mouth daily. 30 tablet 0   amLODipine (NORVASC) 2.5 MG tablet Take 2.5 mg by mouth daily.     chlorthalidone (HYGROTON) 25 MG tablet Take 1 tablet (25 mg  total) by mouth daily. 30 tablet 0   FLUoxetine (PROZAC) 40 MG capsule Take 40 mg by mouth every morning.     gabapentin (NEURONTIN) 400 MG capsule Take 400 mg by mouth 3 (three) times daily.     hydrALAZINE (APRESOLINE) 100 MG tablet Take 1 tablet (100 mg total) by mouth 3 (three) times daily. 90 tablet 0   levETIRAcetam (KEPPRA) 500 MG tablet Take 1 tablet (500 mg total) by mouth 2 (two) times daily. 60 tablet 0   lisinopril (ZESTRIL) 40 MG tablet Take 40 mg by mouth daily.     metoprolol tartrate (LOPRESSOR) 50 MG tablet Take 1 tablet (50 mg total) by mouth 2 (two) times daily. 60 tablet 0   rosuvastatin (CRESTOR) 20 MG tablet Take 1 tablet (20 mg total) by mouth at bedtime. 30 tablet 0   valsartan (DIOVAN) 320 MG tablet Take 1 tablet (320 mg total) by mouth every morning. 30 tablet 0   amLODipine (NORVASC) 5 MG tablet Take 1 tablet (5 mg total) by mouth daily. 30 tablet 0   apixaban (ELIQUIS) 5 MG TABS tablet Take 1 tablet (5 mg total) by mouth 2 (two) times daily. 60 tablet 0   buPROPion (WELLBUTRIN XL) 150 MG 24 hr tablet Take 1 tablet (150 mg total) by mouth daily. 30 tablet 0   furosemide (LASIX) 40 MG tablet Take 1 tablet (40 mg total) by mouth daily. 30 tablet 0   QUEtiapine (SEROQUEL) 100 MG tablet Take 1 tablet (100 mg total) by mouth at bedtime. 30 tablet 0   venlafaxine XR (EFFEXOR-XR) 75 MG 24 hr capsule Take  1 capsule (75 mg total) by mouth daily with breakfast. 30 capsule 0    Lab Results:  Results for orders placed or performed during the hospital encounter of 01/14/23 (from the past 48 hour(s))  Lactic acid, plasma     Status: None   Collection Time: 01/14/23  2:34 PM  Result Value Ref Range   Lactic Acid, Venous 1.7 0.5 - 1.9 mmol/L    Comment: Performed at [facility], [address] [place], [place] [address]  Technologist smear review     Status: None   Collection Time: 01/14/23  2:51 PM  Result Value Ref Range   WBC MORPHOLOGY MORPHOLOGY UNREMARKABLE     RBC MORPHOLOGY MORPHOLOGY UNREMARKABLE    Plt Morphology PLATELETS APPEAR DECREASED    Clinical Information Acute thrombocytopenia     Comment: Performed at [facility], [address] [place], [place] [address]  CBC with Differential/Platelet     Status: Abnormal   Collection Time: 01/14/23  2:51 PM  Result Value Ref Range   WBC 6.1 4.0 - 10.5 K/uL   RBC 3.40 (L) 4.22 - 5.81 MIL/uL   Hemoglobin 11.5 (L) 13.0 - 17.0 g/dL   HCT 35.6 (L) 39.0 - 52.0 %   MCV 104.7 (H) 80.0 - 100.0 fL   MCH 33.8 26.0 - 34.0 pg   MCHC 32.3 30.0 - 36.0 g/dL   RDW 17.5 (H) 11.5 - 15.5 %   Platelets 47 (L) 150 - 400 K/uL    Comment: Immature Platelet Fraction may be clinically indicated, consider ordering this additional test [id number]    nRBC 1.2 (H) 0.0 - 0.2 %   Neutrophils Relative % 80 %   Neutro Abs 4.8 1.7 - 7.7 K/uL   Lymphocytes Relative 11 %   Lymphs Abs 0.7 0.7 - 4.0 K/uL   Monocytes Relative 8 %   Monocytes Absolute 0.5 0.1 - 1.0 K/uL   Eosinophils Relative 0 %   Eosinophils Absolute 0.0 0.0 - 0.5 K/uL   Basophils Relative 0 %   Basophils Absolute 0.0 0.0 - 0.1 K/uL   WBC Morphology MORPHOLOGY UNREMARKABLE    RBC Morphology MORPHOLOGY UNREMARKABLE    Smear Review PLATELETS APPEAR DECREASED    Immature Granulocytes 1 %   Abs Immature Granulocytes 0.06 0.00 - 0.07 K/uL    Comment: Performed at [facility], [address] [place], [place] [address]  Lactic acid, plasma     Status: None   Collection Time: 01/14/23  5:09 PM  Result Value Ref Range   Lactic Acid, Venous 1.7 0.5 - 1.9 mmol/L    Comment: Performed at [facility], [address] [place], [place] [address]  Urinalysis, Routine w reflex microscopic -Urine, Clean Catch     Status: Abnormal   Collection Time: 01/14/23  9:00 PM  Result Value Ref Range   Color, Urine AMBER (A) YELLOW    Comment: BIOCHEMICALS MAY BE AFFECTED BY COLOR   APPearance HAZY (A) CLEAR   Specific Gravity, Urine 1.022  1.005 - 1.030   pH 5.0 5.0 - 8.0   Glucose, UA NEGATIVE NEGATIVE mg/dL   Hgb urine dipstick SMALL (A) NEGATIVE   Bilirubin Urine NEGATIVE NEGATIVE   Ketones, ur NEGATIVE NEGATIVE mg/dL   Protein, ur >=300 (A) NEGATIVE mg/dL   Nitrite NEGATIVE NEGATIVE   Leukocytes,Ua TRACE (A) NEGATIVE   RBC / HPF 0-5 0 - 5 RBC/hpf   WBC, UA 21-50 0 - 5 WBC/hpf   Bacteria, UA RARE (  A) NONE SEEN   Squamous Epithelial / HPF 0-5 0 - 5 /HPF   Mucus PRESENT     Comment: Performed at [facility], [address] [place], [place] [address]  Urine Drug Screen, Qualitative [facility] only)     Status: None   Collection Time: 01/14/23  9:00 PM  Result Value Ref Range   Tricyclic, Ur Screen NONE DETECTED NONE DETECTED   Amphetamines, Ur Screen NONE DETECTED NONE DETECTED   MDMA (Ecstasy)Ur Screen NONE DETECTED NONE DETECTED   Cocaine Metabolite,Ur [place] NONE DETECTED NONE DETECTED   Opiate, Ur Screen NONE DETECTED NONE DETECTED   Phencyclidine (PCP) Ur S NONE DETECTED NONE DETECTED   Cannabinoid 50 Ng, Ur [place] NONE DETECTED NONE DETECTED   Barbiturates, Ur Screen NONE DETECTED NONE DETECTED   Benzodiazepine, Ur Scrn NONE DETECTED NONE DETECTED   Methadone Scn, Ur NONE DETECTED NONE DETECTED    Comment: (NOTE) Tricyclics + metabolites, urine    Cutoff 1000 ng/mL Amphetamines + metabolites, urine  Cutoff 1000 ng/mL MDMA (Ecstasy), urine              Cutoff 500 ng/mL Cocaine Metabolite, urine          Cutoff 300 ng/mL Opiate + metabolites, urine        Cutoff 300 ng/mL Phencyclidine (PCP), urine         Cutoff 25 ng/mL Cannabinoid, urine                 Cutoff 50 ng/mL Barbiturates + metabolites, urine  Cutoff 200 ng/mL Benzodiazepine, urine              Cutoff 200 ng/mL Methadone, urine                   Cutoff 300 ng/mL  The urine drug screen provides only a preliminary, unconfirmed analytical test result and should not be used for non-medical purposes. Clinical consideration and professional judgment  should be applied to any positive drug screen result due to possible interfering substances. A more specific alternate chemical method must be used in order to obtain a confirmed analytical result. Gas chromatography / mass spectrometry (GC/MS) is the preferred confirm atory method. Performed at [facility], [address] [place], [place] [address]   Procalcitonin     Status: None   Collection Time: 01/15/23  4:42 AM  Result Value Ref Range   Procalcitonin 0.11 ng/mL    Comment:        Interpretation: PCT (Procalcitonin) <= 0.5 ng/mL: Systemic infection (sepsis) is not likely. Local bacterial infection is possible. (NOTE)       Sepsis PCT Algorithm           Lower Respiratory Tract                                      Infection PCT Algorithm    ----------------------------     ----------------------------         PCT < 0.25 ng/mL                PCT < 0.10 ng/mL          Strongly encourage             Strongly discourage   discontinuation of antibiotics    initiation of antibiotics    ----------------------------     -----------------------------  PCT 0.25 - 0.50 ng/mL            PCT 0.10 - 0.25 ng/mL               OR       >80% decrease in PCT            Discourage initiation of                                            antibiotics      Encourage discontinuation           of antibiotics    ----------------------------     -----------------------------         PCT >= 0.50 ng/mL              PCT 0.26 - 0.50 ng/mL               AND        <80% decrease in PCT             Encourage initiation of                                             antibiotics       Encourage continuation           of antibiotics    ----------------------------     -----------------------------        PCT >= 0.50 ng/mL                  PCT > 0.50 ng/mL               AND         increase in PCT                  Strongly encourage                                      initiation of  antibiotics    Strongly encourage escalation           of antibiotics                                     -----------------------------                                           PCT <= 0.25 ng/mL                                                 OR                                        > 80% decrease in PCT  Discontinue / Do not initiate                                             antibiotics  Performed at [facility], [address] [place], [place] [address]   Comprehensive metabolic panel     Status: Abnormal   Collection Time: 01/15/23  4:42 AM  Result Value Ref Range   Sodium 136 135 - 145 mmol/L   Potassium 3.7 3.5 - 5.1 mmol/L   Chloride 96 (L) 98 - 111 mmol/L   CO2 30 22 - 32 mmol/L   Glucose, Bld 85 70 - 99 mg/dL    Comment: Glucose reference range applies only to samples taken after fasting for at least 8 hours.   BUN 20 6 - 20 mg/dL   Creatinine, Ser 0.95 0.61 - 1.24 mg/dL   Calcium 7.9 (L) 8.9 - 10.3 mg/dL   Total Protein 7.0 6.5 - 8.1 g/dL   Albumin 3.1 (L) 3.5 - 5.0 g/dL   AST 36 15 - 41 U/L   ALT 17 0 - 44 U/L   Alkaline Phosphatase 66 38 - 126 U/L   Total Bilirubin 1.6 (H) 0.3 - 1.2 mg/dL   GFR, Estimated >60 >60 mL/min    Comment: (NOTE) Calculated using the CKD-EPI Creatinine Equation (2021)    Anion gap 10 5 - 15    Comment: Performed at [facility], [address] [place], [place] [address]  CBC     Status: Abnormal   Collection Time: 01/15/23  4:42 AM  Result Value Ref Range   WBC 5.1 4.0 - 10.5 K/uL   RBC 3.24 (L) 4.22 - 5.81 MIL/uL   Hemoglobin 11.0 (L) 13.0 - 17.0 g/dL   HCT 33.8 (L) 39.0 - 52.0 %   MCV 104.3 (H) 80.0 - 100.0 fL   MCH 34.0 26.0 - 34.0 pg   MCHC 32.5 30.0 - 36.0 g/dL   RDW 17.2 (H) 11.5 - 15.5 %   Platelets 44 (L) 150 - 400 K/uL    Comment: Immature Platelet Fraction may be clinically indicated, consider ordering this additional test [id number] CONSISTENT WITH  PREVIOUS RESULT    nRBC 1.8 (H) 0.0 - 0.2 %    Comment: Performed at [facility], [address] [place], [place] [address]  CBG monitoring, ED     Status: None   Collection Time: 01/15/23  8:04 AM  Result Value Ref Range   Glucose-Capillary 82 70 - 99 mg/dL    Comment: Glucose reference range applies only to samples taken after fasting for at least 8 hours.   Comment 1 Notify RN    Comment 2 Document in Chart   Culture, blood (Routine X 2) w Reflex to ID Panel     Status: None (Preliminary result)   Collection Time: 01/15/23  8:43 AM   Specimen: BLOOD  Result Value Ref Range   Specimen Description BLOOD BLOOD LEFT HAND    Special Requests      BOTTLES DRAWN AEROBIC AND ANAEROBIC Blood Culture adequate volume   Culture      NO GROWTH < 24 HOURS Performed at [facility], [address] [place], [place] [address]    Report Status PENDING   Culture, blood (Routine X 2) w Reflex to ID Panel     Status: None (Preliminary result)   Collection  Time: 01/15/23  9:00 AM   Specimen: BLOOD  Result Value Ref Range   Specimen Description BLOOD BLOOD RIGHT HAND    Special Requests      BOTTLES DRAWN AEROBIC AND ANAEROBIC Blood Culture adequate volume   Culture      NO GROWTH < 24 HOURS Performed at [facility], [address], [place], [place] [address]    Report Status PENDING   Lactate dehydrogenase     Status: Abnormal   Collection Time: 01/15/23  9:30 AM  Result Value Ref Range   LDH 243 (H) 98 - 192 U/L    Comment: Performed at [facility], [address] [place], [place] [address]  Folate     Status: Abnormal   Collection Time: 01/15/23  9:30 AM  Result Value Ref Range   Folate 5.2 (L) >5.9 ng/mL    Comment: Performed at [facility], [address] [place], [place] [address]  Ferritin     Status: Abnormal   Collection Time: 01/15/23  9:30 AM  Result Value Ref Range   Ferritin 730 (H) 24 - 336 ng/mL    Comment:  Performed at [facility], [address] [place], [place] [address]  Iron and TIBC     Status: Abnormal   Collection Time: 01/15/23  9:30 AM  Result Value Ref Range   Iron 80 45 - 182 ug/dL   TIBC 237 (L) 250 - 450 ug/dL   Saturation Ratios 34 17.9 - 39.5 %   UIBC 157 ug/dL    Comment: Performed at [facility], [place], [place] [address]  Rapid HIV screen (HIV 1/2 Ab+Ag)     Status: None   Collection Time: 01/15/23  3:32 PM  Result Value Ref Range   HIV-1 P24 Antigen - HIV24 NON REACTIVE NON REACTIVE    Comment: (NOTE) Detection of p24 may be inhibited by biotin in the sample, causing false negative results in acute infection.    HIV 1/2 Antibodies NON REACTIVE NON REACTIVE   Interpretation (HIV Ag Ab)      A non reactive test result means that HIV 1 or HIV 2 antibodies and HIV 1 p24 antigen were not detected in the specimen.    Comment: Performed at [facility], [address] [place], [place] [address]  Hepatitis C antibody     Status: None   Collection Time: 01/15/23  3:32 PM  Result Value Ref Range   HCV Ab NON REACTIVE NON REACTIVE    Comment: (NOTE) Nonreactive HCV antibody screen is consistent with no HCV infections,  unless recent infection is suspected or other evidence exists to indicate HCV infection.  Performed at [facility], [place] [address], [place], [place] [address]   APTT     Status: None   Collection Time: 01/15/23  3:32 PM  Result Value Ref Range   aPTT 32 24 - 36 seconds    Comment: Performed at [facility], [address] [place], [place] [address]  Fibrinogen     Status: Abnormal   Collection Time: 01/15/23  3:32 PM  Result Value Ref Range   Fibrinogen 514 (H) 210 - 475 mg/dL    Comment: (NOTE) Fibrinogen results may be underestimated in patients receiving thrombolytic therapy. Performed at [facility], [address] [place], [place] [address]   D-dimer, quantitative      Status: Abnormal   Collection Time: 01/15/23  3:32 PM  Result  Value Ref Range   D-Dimer, Quant 1.71 (H) 0.00 - 0.50 ug/mL-FEU    Comment: (NOTE) At the manufacturer cut-off value of 0.5 g/mL FEU, this assay has a negative predictive value of 95-100%.This assay is intended for use in conjunction with a clinical pretest probability (PTP) assessment model to exclude pulmonary embolism (PE) and deep venous thrombosis (DVT) in outpatients suspected of PE or DVT. Results should be correlated with clinical presentation. Performed at [facility], [address] [place], [place] [address]   Procalcitonin     Status: None   Collection Time: 01/16/23  5:01 AM  Result Value Ref Range   Procalcitonin <0.10 ng/mL    Comment:        Interpretation: PCT (Procalcitonin) <= 0.5 ng/mL: Systemic infection (sepsis) is not likely. Local bacterial infection is possible. (NOTE)       Sepsis PCT Algorithm           Lower Respiratory Tract                                      Infection PCT Algorithm    ----------------------------     ----------------------------         PCT < 0.25 ng/mL                PCT < 0.10 ng/mL          Strongly encourage             Strongly discourage   discontinuation of antibiotics    initiation of antibiotics    ----------------------------     -----------------------------       PCT 0.25 - 0.50 ng/mL            PCT 0.10 - 0.25 ng/mL               OR       >80% decrease in PCT            Discourage initiation of                                            antibiotics      Encourage discontinuation           of antibiotics    ----------------------------     -----------------------------         PCT >= 0.50 ng/mL              PCT 0.26 - 0.50 ng/mL               AND        <80% decrease in PCT             Encourage initiation of                                             antibiotics       Encourage continuation           of antibiotics     ----------------------------     -----------------------------        PCT >= 0.50 ng/mL  PCT > 0.50 ng/mL               AND         increase in PCT                  Strongly encourage                                      initiation of antibiotics    Strongly encourage escalation           of antibiotics                                     -----------------------------                                           PCT <= 0.25 ng/mL                                                 OR                                        > 80% decrease in PCT                                      Discontinue / Do not initiate                                             antibiotics  Performed at [facility], [address] [place], [place] [address]   Magnesium     Status: Abnormal   Collection Time: 01/16/23  5:01 AM  Result Value Ref Range   Magnesium 1.6 (L) 1.7 - 2.4 mg/dL    Comment: Performed at [facility], [address] [place], [place] [address]  CBC     Status: Abnormal   Collection Time: 01/16/23  5:01 AM  Result Value Ref Range   WBC 5.4 4.0 - 10.5 K/uL   RBC 3.21 (L) 4.22 - 5.81 MIL/uL   Hemoglobin 10.8 (L) 13.0 - 17.0 g/dL   HCT 33.5 (L) 39.0 - 52.0 %   MCV 104.4 (H) 80.0 - 100.0 fL   MCH 33.6 26.0 - 34.0 pg   MCHC 32.2 30.0 - 36.0 g/dL   RDW 16.9 (H) 11.5 - 15.5 %   Platelets 45 (L) 150 - 400 K/uL    Comment: Immature Platelet Fraction may be clinically indicated, consider ordering this additional test [id number] PLATELET COUNT CONFIRMED BY SMEAR    nRBC 2.8 (H) 0.0 - 0.2 %    Comment: Performed at [facility], [address] [place], [place] [id number]  Basic metabolic panel     Status: Abnormal   Collection Time: 01/16/23  5:01 AM  Result Value Ref Range  Sodium 134 (L) 135 - 145 mmol/L   Potassium 3.9 3.5 - 5.1 mmol/L   Chloride 95 (L) 98 - 111 mmol/L   CO2 30 22 - 32 mmol/L   Glucose, Bld 97 70 - 99 mg/dL    Comment:  Glucose reference range applies only to samples taken after fasting for at least 8 hours.   BUN 21 (H) 6 - 20 mg/dL   Creatinine, Ser 0.95 0.61 - 1.24 mg/dL   Calcium 8.0 (L) 8.9 - 10.3 mg/dL   GFR, Estimated >60 >60 mL/min    Comment: (NOTE) Calculated using the CKD-EPI Creatinine Equation (2021)    Anion gap 9 5 - 15    Comment: Performed at [facility], [address] [place], [place] [address]  CBG monitoring, ED     Status: None   Collection Time: 01/16/23  7:52 AM  Result Value Ref Range   Glucose-Capillary 91 70 - 99 mg/dL    Comment: Glucose reference range applies only to samples taken after fasting for at least 8 hours.  TSH     Status: None   Collection Time: 01/16/23  8:23 AM  Result Value Ref Range   TSH 1.650 0.350 - 4.500 uIU/mL    Comment: Performed by a 3rd Generation assay with a functional sensitivity of <=0.01 uIU/mL. Performed at [facility], [address] [place], [place] [address]     Blood Alcohol level:  Lab Results  Component Value Date   ETH [facility]) 01/14/2023   ETH <10 10/29/2022    Physical Findings: AIMS:  , ,  ,  ,    CIWA:  CIWA-Ar Total: 8 COWS:     Musculoskeletal: Strength & Muscle Tone: decreased Gait & Station:  Did not observe  Patient leans: N/A  Psychiatric Specialty Exam:  Presentation  General Appearance:  Appropriate for Environment  Eye Contact: Good  Speech: Normal Rate  Speech Volume: Normal  Handedness: Right   Mood and Affect  Mood: Euthymic  Affect: Congruent   Thought Process  Thought Processes: Coherent  Descriptions of Associations:Intact  Orientation:Full (Time, Place and Person)  Thought Content:WDL; Logical  History of Schizophrenia/Schizoaffective disorder:No  Duration of Psychotic Symptoms:No data recorded Hallucinations:Hallucinations: None  Ideas of Reference:None  Suicidal Thoughts:Suicidal Thoughts: No  Homicidal Thoughts:Homicidal Thoughts:  No   Sensorium  Memory: Immediate Good  Judgment: Fair  Insight: Fair   Executive Functions  Concentration: Good  Attention Span: Good  Recall: Good  Fund of Knowledge: Fair  Language: Good   Psychomotor Activity  Psychomotor Activity: Psychomotor Activity: Decreased   Assets  Assets: Financial Resources/Insurance; Housing; Intimacy; Social Support   Sleep  Sleep: Sleep: Poor    Physical Exam: Physical Exam Constitutional:      Appearance: He is obese. He is ill-appearing.  HENT:     Head: Normocephalic and atraumatic.     Nose: Nose normal.     Mouth/Throat:     Mouth: Mucous membranes are moist.  Musculoskeletal:     Cervical back: Normal range of motion.  Neurological:     Mental Status: Mental status is at baseline.  Psychiatric:        Mood and Affect: Mood normal.        Behavior: Behavior normal.     Blood pressure 108/86, pulse 85, temperature 98.2 F (36.8 C), temperature source Oral, resp. rate (!) 23, height 5' 11"$  (1.803 m), weight 108.9 kg, SpO2 94 %. Body mass index is 33.47 kg/m.  Treatment Plan Summary: Plan - The patient does not meet criteria for inpatient psychiatric admission. Patient educated on the importance of medication compliance and potential consequences of noncompliance. Patient encouraged to schedule and attend regular appointments with his outpatient provider for proper mental health care and medication management. Patient educated impact of alcohol on his mental health and recommended to consider strategies for abstinence. Will add Melatonin 5 mg at bedtime to aid in sleep. Reviewed with Dr. Reesa Chew, [place].   Ronny Flurry, NP 01/16/2023, 12:31 PM

## 2023-01-16 NOTE — Assessment & Plan Note (Signed)
Resolved with hydration Most likely secondary to sepsis.   -Monitor renal function -Avoid nephrotoxin

## 2023-01-16 NOTE — Assessment & Plan Note (Signed)
Able to wean back to baseline oxygen but remained tachypneic. Secondary to aspiration pneumonia.  Uses 3 to 4 L at baseline,.  He was hypoxic up to 86% on baseline in the ED. elevated D-dimer and lower extremity venous Doppler was negative for DVT.  CTA was mostly inconclusive.  Initially unable to get VQ scan, but since then, patient's oxygenation has much improved and he is not tachycardic and probability of PE much lower.

## 2023-01-16 NOTE — Discharge Instructions (Signed)
                  Intensive Outpatient Programs  [facility]    [facility] [address]     [address] [place],  [place]     [place], [place] [phone]      [phone]  [facility]   [facility]  (Inpatient and outpatient)  [phone] (Suboxone and Methadone) [address]           [phone]           [facility]    [facility] - Intensive Outpatient [address]     [address] [place], [place] [address]     [place], [place]  [phone]      [phone]  [facility] (Outpatient, Inpatient, Chemical  [facility] (Groups and Residental) (insurance only) [phone]    [place], [place]          [phone]       [facility]    [facility] (for caregivers and family) [address]     [address] [place], [place]     [place], [place] [phone]      [phone]  Residential Treatment Programs  [facility]  [facility](2 years) Residential: 90 days)  [facility] [address]      [address] [place], [place]     [place], [place] [phone]      [phone] or [phone]  [facility]    [facility] [address]      [address] [place], [place]     [place], [place] [phone]      [phone]  [facility]   [facility] ([facility]) [address]     [address] [place], [place] [address]     [place], [place] [phone]      [phone] Admissions: 8am-3pm M-F  [facility]: Residential Program (90 Days)              [facility]: [facility]  [place], [place]     [place], [place]  [phone] or [phone]    (Walk in Hours over the weekend or by referral)   [facility]: [facility]:[phone] (for crisis  response 24 hours a day) 

## 2023-01-16 NOTE — ED Notes (Signed)
Pt alert to self. Pt currently still attempting eat food on tray from breakfast.

## 2023-01-16 NOTE — ED Notes (Signed)
Echo at patient bedside

## 2023-01-16 NOTE — ED Notes (Signed)
Pt going to CT. Also talked to Butch Penny at [facility] and informed her patient has had no oral intake of food except sips of water with oral meds for me or previous nurse.

## 2023-01-16 NOTE — Assessment & Plan Note (Addendum)
Patient with acute onset thrombocytopenia with platelet at 54>>44>>45.  No obvious bleeding.  LDH mildly elevated.  Smear was negative for any abnormal morphology. Platelets normal 62-monthago.RUQ [place] for hepatics cirrhosis, can be due to alcohol intoxication. Checking some more labs to find out the cause of acute thrombocytopenia-pending results except negative HIV and hepatitis C.  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  Hematology was consulted.  Platelet count staying now above 50, Eliquis restarted as of 2/15, and platelet count still steadily improving.

## 2023-01-16 NOTE — Assessment & Plan Note (Addendum)
EKG with A-fib and RVR.  Patient was not very compliant with his home amiodarone and metoprolol.  Heart rate with some improvement after increasing the dose of metoprolol -Continue with increased the dose of metoprolol at 100 mg twice daily -Continue amiodarone -Initially held home Eliquis due to thrombocytopenia-restarted on 2/15

## 2023-01-16 NOTE — ED Notes (Signed)
IVC/pending medical admit

## 2023-01-16 NOTE — ED Notes (Signed)
Pt placed in hospital bed with assistance of Tabitha, Student nurse. Pt tolerated well and appreciative.

## 2023-01-16 NOTE — Progress Notes (Signed)
Progress Note   Patient: Andrew Sparks [id number] DOB: 06/23/1966 DOA: 01/14/2023     2 DOS: the patient was seen and examined on 01/16/2023   Brief hospital course:  Jianni Wist is a 57 y.o. male with medical history significant of paroxysmal atrial fibrillation, chronic HFpEF, chronic respiratory failure on 2 to 3 L at home hypertension, alcohol abuse, seizure disorder and depression was brought to ED via EMS when he fell out of chair and had some seizure-like activity, no incontinence or tongue bite, he drank 2 pints of liquor before that.   Patient has stopped taking most of his home medications which includes Keppra, stating that he does not have any money.  ED course.  On arrival patient was in A-fib with RVR, tachypneic and hypoxic in mid 80s requiring up to 6 L of oxygen.  Labs pertinent for platelet count of 54, BUN 26, creatinine 1.29, total bilirubin 2.1 and AST of 50, BNP 227, lactic acid 3.4>>2.9.  Respiratory panel negative for COVID, RSV and influenza.  Ethanol markedly elevated at 419. Chest x-ray with limited information so CT chest without contrast was obtained which shows consolidative airspace disease in the posterior right middle lobe, inferior right lower lobe and posterior left costophrenic sulcus.  Features compatible with multifocal pneumonia and concern of aspiration.  Also concern of pulmonary hypertension, hepatic steatosis and cholelithiasis along with aortic atherosclerosis.   Patient received Keppra load and started on Unasyn for concern of aspiration pneumonia.  Also started on CIWA protocol for concern of alcohol abuse.  2/12: Patient remained mildly tachycardic and tachypneic, blood pressure at 152/124-increasing the dose of metoprolol to 100 mg twice daily.  Preliminary blood cultures with 1 set positive for MRSE, most likely a contaminant, vancomycin was added, ordered repeat blood cultures this morning but patient already received loading  dose of vancomycin around 4 AM this morning.  Procalcitonin remains stable at 0.11 and no leukocytosis.  Gradual worsening of thrombocytopenia, platelets at 44.  AST normalized and T. bili improving.  AKI resolved. Mildly elevated LDH.  Checking some more labs to find a cause of thrombocytopenia, also consulted hematology-Dr Maryjane Hurter will see the patient. Patient also expressed some suicidal thoughts without any plan overnight-psych was consulted for more clarification as patient has an history of prior suicidal thoughts and was being treated at geropsych department last year.  2/13: Continue to have heart rate in low 100, blood pressure improved.  Labs with platelet stable at 45, magnesium 1.6 which is being repleted.  Procalcitonin normalized.  HIV and hep C negative, folate low at [id number] folic acid supplement.  Repeat preliminary blood cultures negative in 24-hour.  D-dimer elevated at 1.71 along with elevated fibrinogen at 514, Lower extremity venous Doppler was negative for DVT.  RUQ ultrasound shows cholelithiasis with concern of cholecystitis and hepatic steatosis-recommending HIDA scan which was ordered. Patient with little worsening of respiratory symptoms and tachypnea, although saturating well on his baseline oxygen requirement of 3 L.  CTA to rule out PE was also ordered. Per oncology they will try her to hold anticoagulation until platelets are persistently above 50.  Assessment and Plan: * Acute on chronic respiratory failure with hypoxia (HCC) Able to wean back to baseline oxygen but remained tachypneic. Most likely secondary to aspiration pneumonia.  Uses 3 to 4 L at baseline,.  He was hypoxic up to 86% on baseline in the ED. elevated D-dimer and lower extremity venous Doppler was negative for DVT. -Checking CTA to rule  out PE -Try weaning back to baseline  Sepsis with acute hypoxic respiratory failure without septic shock [facility]) Patient met sepsis criteria with tachycardia,  tachypnea, imaging concerning for pneumonia.  Elevated lactic acid at 3.4>>2.9>>1.7 and AKI.  Respiratory panel negative for COVID, influenza and RSV.  Patient was started on Unasyn for concern of aspiration most likely secondary to alcohol intoxication. 1 set of blood cultures with MRSE-most likely contaminant but vancomycin was also added.  Repeat blood cultures negative in 24-hour but unfortunately he received a loading dose of vancomycin before that repeat. -Continue vancomycin-we will discontinue if repeat blood cultures remain negative in 48 hours. -Continue Unasyn and Zithromax. -Follow-up blood cultures -Follow strep pneumo and Legionella antigen. -Follow-up sputum cultures   Seizure-like activity (HCC) Concern of seizure at home by girlfriend, no incontinence or tongue bite.  No significant postictal state.  Patient has an history of seizure disorder and stopped taking his Keppra for about a month.  No seizure-like activity noted in ED Received loading dose of Keppra in ED. -Continue with home dose of Keppra  Thrombocytopenia ([place]) Patient with acute onset thrombocytopenia with platelet at 54>>44>>45.  No obvious bleeding.  LDH mildly elevated.  Smear was negative for any abnormal morphology. Platelets normal 60-monthago.RUQ UK[place]egative for hepatics cirrhosis, can be due to alcohol intoxication. Checking some more labs to find out the cause of acute thrombocytopenia-pending results except negative HIV and hepatitis C. Hematology consult -Holding home Eliquis-can be restarted once platelet persistently above 50 -Monitor platelet   Paroxysmal atrial fibrillation (HCC) EKG with A-fib and RVR.  Patient was not very compliant with his home amiodarone and metoprolol.  Heart rate with some improvement after increasing the dose of metoprolol -Continue with increased the dose of metoprolol at 100 mg twice daily -Continue amiodarone -Holding home Eliquis due to thrombocytopenia-we will  restart once platelet counts persistently more than [id number] Chronic diastolic CHF (congestive heart failure) ([facility] Echo done in November 2023 with normal EF and grade 1 diastolic dysfunction. BNP of 227.  1+ lower extremity edema.  Patient received 1 dose of IV Lasix -Restart home Lasix at 40 mg daily p.o. -Avoid IV fluid. -Continue to monitor  Alcohol abuse Markedly elevated ethanol levels on admission.  Patient uses liquor regularly. Counseling was provided. -CIWA protocol with Ativan  Essential hypertension Patient was on multiple antihypertensives at home which he was not taking it for the past couple of weeks.  Blood pressure now within goal after restarting home amlodipine. -Continue home amlodipine -We can restart ACE inhibitor if needed as AKI has been resolved. -Metoprolol dose was increased and restarted home Lasix  Major depressive disorder, recurrent severe without psychotic features ([facility] Patient exhibits some suicidal thoughts overnight so he was IVC'ed. Denies any suicidal thoughts this morning. Psych consult for further evaluation Not taking his home Seroquel and Effexor.   AKI (acute kidney injury) ([place] Resolved with hydration Most likely secondary to sepsis.   -Monitor renal function -Avoid nephrotoxin  Tobacco abuse Counseling was provided. -Nicotine patch  Dyslipidemia -Continue statin   Subjective: Patient was feeling little somnolent when seen today.  Denies any pain.  Physical Exam: Vitals:   01/16/23 1000 01/16/23 1030 01/16/23 1130 01/16/23 1230  BP: (!) 102/91 91/74 108/86 (!) 114/95  Pulse: 85   83  Resp: 20 16 (!) 23 (!) 22  Temp:    98.4 F (36.9 C)  TempSrc:    Oral  SpO2: 94%   98%  Weight:  Height:       General.  Lethargic gentleman, in no acute distress. Pulmonary.  Coarse breath sounds with some wheezes bilaterally, normal respiratory effort. CV.  Regular rate and rhythm, no JVD, rub or murmur. Abdomen.  Soft,  nontender, nondistended, BS positive. CNS.  Alert and oriented .  No focal neurologic deficit. Extremities.  1+ LE edema, pulses intact and symmetrical. Psychiatry.  Appears to have some cognitive impairment.  Data Reviewed: Prior data reviewed  Family Communication: Discussed with significant other on phone.  Disposition: Status is: Inpatient Remains inpatient appropriate because: Severity of illness  Planned Discharge Destination: Home with Home Health  DVT prophylaxis.  Eliquis Time spent: 52 minutes  This record has been created using Systems analyst. Errors have been sought and corrected,but may not always be located. Such creation errors do not reflect on the standard of care.   Author: Lorella Nimrod, MD 01/16/2023 1:45 PM  For on call review [email or web address].

## 2023-01-17 DIAGNOSIS — D696 Thrombocytopenia, unspecified: Secondary | ICD-10-CM | POA: Diagnosis not present

## 2023-01-17 DIAGNOSIS — J9621 Acute and chronic respiratory failure with hypoxia: Secondary | ICD-10-CM | POA: Diagnosis not present

## 2023-01-17 DIAGNOSIS — R569 Unspecified convulsions: Secondary | ICD-10-CM | POA: Diagnosis not present

## 2023-01-17 DIAGNOSIS — A419 Sepsis, unspecified organism: Secondary | ICD-10-CM | POA: Diagnosis not present

## 2023-01-17 LAB — CBC
HCT: 32.7 % — ABNORMAL LOW (ref 39.0–52.0)
Hemoglobin: 10.2 g/dL — ABNORMAL LOW (ref 13.0–17.0)
MCH: 34 pg (ref 26.0–34.0)
MCHC: 31.2 g/dL (ref 30.0–36.0)
MCV: 109 fL — ABNORMAL HIGH (ref 80.0–100.0)
Platelets: 55 10*3/uL — ABNORMAL LOW (ref 150–400)
RBC: 3 MIL/uL — ABNORMAL LOW (ref 4.22–5.81)
RDW: 17.2 % — ABNORMAL HIGH (ref 11.5–15.5)
WBC: 4.8 10*3/uL (ref 4.0–10.5)
nRBC: 1.4 % — ABNORMAL HIGH (ref 0.0–0.2)

## 2023-01-17 LAB — BASIC METABOLIC PANEL
Anion gap: 10 (ref 5–15)
BUN: 28 mg/dL — ABNORMAL HIGH (ref 6–20)
CO2: 27 mmol/L (ref 22–32)
Calcium: 8 mg/dL — ABNORMAL LOW (ref 8.9–10.3)
Chloride: 97 mmol/L — ABNORMAL LOW (ref 98–111)
Creatinine, Ser: 1.17 mg/dL (ref 0.61–1.24)
GFR, Estimated: >60 mL/min (ref >60)
Glucose, Bld: 115 mg/dL — ABNORMAL HIGH (ref 70–99)
Potassium: 5.3 mmol/L — ABNORMAL HIGH (ref 3.5–5.1)
Sodium: 134 mmol/L — ABNORMAL LOW (ref 135–145)

## 2023-01-17 LAB — CULTURE, BLOOD (SINGLE)

## 2023-01-17 LAB — HUMAN PARVOVIRUS DNA DETECTION BY PCR: Parvovirus B19, PCR: NEGATIVE

## 2023-01-17 LAB — MAGNESIUM: Magnesium: 2 mg/dL (ref 1.7–2.4)

## 2023-01-17 LAB — EPSTEIN BARR VRS(EBV DNA BY PCR): EBV DNA QN by PCR: NEGATIVE IU/mL

## 2023-01-17 LAB — CBG MONITORING, ED: Glucose-Capillary: 118 mg/dL — ABNORMAL HIGH (ref 70–99)

## 2023-01-17 MED ORDER — PATIROMER SORBITEX CALCIUM 8.4 G PO PACK
8.4000 g | PACK | Freq: Once | ORAL | Status: AC
  Administered 2023-01-17: 8.4 g via ORAL
  Filled 2023-01-17: qty 1

## 2023-01-17 NOTE — ED Notes (Signed)
ivc/pending medical admit.Andrew Sparks

## 2023-01-17 NOTE — ED Notes (Signed)
Pt talking on cordless phone.

## 2023-01-17 NOTE — Progress Notes (Signed)
Occupational Therapy Treatment Patient Details Name: Andrew Sparks MRN: [id number] DOB: [birth year] Today's Date: 01/17/2023   History of present illness Patient is a 57 year old male with concern for seizure at home. Acute on chronic respiratory failure with hypoxia, sepsis due to pneumonia. Medical history significant of paroxysmal atrial fibrillation, chronic HFpEF, hypertension, alcohol abuse, seizure disorder and depression.   OT comments  Upon entering the room, pt supine in bed and sleeping soundly. Pt reports wanting to return home but pt has not ambulated since being in hospital and encouraged to ambulate around bed to recliner chair. Pt's bed linens are saturated with urine. Pt stands with mod A secondary to him pushing heavy towards the R side and needing assistance to correct. Pt ambulates ~ 8 feet with RW towards recliner chair and unable to go any further. Therapist having to unlock and pull chair up behind him for safety. He is very fatigued at this point. OT discussed change in recommendation to SNF based on current session and assistance pt would need at home. Pt remains in recliner chair at end of session and MD and RN notified of change in recommendation.    Recommendations for follow up therapy are one component of a multi-disciplinary discharge planning process, led by the attending physician.  Recommendations may be updated based on patient status, additional functional criteria and insurance authorization.    Follow Up Recommendations  Skilled nursing-short term rehab (<3 hours/day)     Assistance Recommended at Discharge Intermittent Supervision/Assistance  Patient can return home with the following  Help with stairs or ramp for entrance;Assist for transportation;Assistance with cooking/housework;A lot of help with walking and/or transfers;A lot of help with bathing/dressing/bathroom   Equipment Recommendations  Other (comment) (defer to next venue of care)        Precautions / Restrictions Precautions Precautions: Fall       Mobility Bed Mobility Overal bed mobility: Needs Assistance Bed Mobility: Supine to Sit     Supine to sit: Min assist          Transfers Overall transfer level: Needs assistance Equipment used: 1 person hand held assist Transfers: Sit to/from Stand, Bed to chair/wheelchair/BSC Sit to Stand: Mod assist     Step pivot transfers: Min assist           Balance Overall balance assessment: Needs assistance Sitting-balance support: Feet unsupported, No upper extremity supported Sitting balance-Leahy Scale: Good     Standing balance support: Single extremity supported, Reliant on assistive device for balance, During functional activity Standing balance-Leahy Scale: Poor                             ADL either performed or assessed with clinical judgement    Extremity/Trunk Assessment Upper Extremity Assessment Upper Extremity Assessment: Generalized weakness   Lower Extremity Assessment Lower Extremity Assessment: Generalized weakness        Vision Patient Visual Report: No change from baseline            Cognition Arousal/Alertness: Lethargic Behavior During Therapy: Flat affect Overall Cognitive Status: Within Functional Limits for tasks assessed                                 General Comments: slow to process and needing cues for safety awareness  Pertinent Vitals/ Pain       Pain Assessment Pain Assessment: No/denies pain         Frequency  Min 2X/week        Progress Toward Goals  OT Goals(current goals can now be found in the care plan section)  Progress towards OT goals: Progressing toward goals  Acute Rehab OT Goals Patient Stated Goal: to go home OT Goal Formulation: With patient Time For Goal Achievement: 01/29/23 Potential to Achieve Goals: Good  Plan Discharge plan needs to be updated;Frequency remains appropriate        AM-PAC OT "6 Clicks" Daily Activity     Outcome Measure   Help from another person eating meals?: None Help from another person taking care of personal grooming?: A Little Help from another person toileting, which includes using toliet, bedpan, or urinal?: A Lot Help from another person bathing (including washing, rinsing, drying)?: A Lot Help from another person to put on and taking off regular upper body clothing?: None Help from another person to put on and taking off regular lower body clothing?: A Little 6 Click Score: 18    End of Session Equipment Utilized During Treatment: Rolling walker (2 wheels)  OT Visit Diagnosis: Unsteadiness on feet (R26.81);Muscle weakness (generalized) (M62.81);History of falling (Z91.81)   Activity Tolerance Patient tolerated treatment well   Patient Left in chair;with chair alarm set;with call bell/phone within reach   Nurse Communication Mobility status        Time: [id number] OT Time Calculation (min): 26 min  Charges: OT General Charges $OT Visit: 1 Visit OT Treatments $Therapeutic Activity: 23-37 mins  Darleen Crocker, MS, OTR/L , CBIS ascom [phone]  01/17/23, 1:47 PM

## 2023-01-17 NOTE — ED Notes (Signed)
Pt eating meal tray 

## 2023-01-17 NOTE — ED Notes (Signed)
Report from [place], rn

## 2023-01-17 NOTE — Progress Notes (Signed)
Physical Therapy Treatment Patient Details Name: Andrew Sparks MRN: [id number] DOB: Dec 14, 1965 Today's Date: 01/17/2023   History of Present Illness Patient is a 57 year old male with concern for seizure at home. Acute on chronic respiratory failure with hypoxia, sepsis due to pneumonia. Medical history significant of paroxysmal atrial fibrillation, chronic HFpEF, hypertension, alcohol abuse, seizure disorder and depression.    PT Comments    Patient sleeping in recliner chair, leaning over to the right on arrival to room. He wakes up to voice but is groggy throughout. He was able to stand with minimal assistance from recliner chair with standing tolerance of around 20 seconds. Posterior lean in standing with facilitation required to maintain midline. The patient reports mild dizziness with standing with vitals stable throughout session. Discharge recommendation updated to reflect SNF placement at discharge.    Recommendations for follow up therapy are one component of a multi-disciplinary discharge planning process, led by the attending physician.  Recommendations may be updated based on patient status, additional functional criteria and insurance authorization.  Follow Up Recommendations  Skilled nursing-short term (<3 hours/day)     Assistance Recommended at Discharge Intermittent Supervision/Assistance  Patient can return home with the following A little help with walking and/or transfers;A little help with bathing/dressing/bathroom;Assist for transportation;Help with stairs or ramp for entrance   Equipment Recommendations  None recommended by PT    Recommendations for Other Services       Precautions / Restrictions Precautions Precautions: Fall Restrictions Weight Bearing Restrictions: No     Mobility  Bed Mobility               General bed mobility comments: patient seated in chair  leaning to the right and sleeping on arrival to room. bed mobility not  assessed    Transfers Overall transfer level: Needs assistance Equipment used: Rolling walker (2 wheels) Transfers: Sit to/from Stand Sit to Stand: Min assist           General transfer comment: faciliation required for anterior weight shifting as patient with posterior biasis. verbal cues for technique    Ambulation/Gait               General Gait Details: not attempted due to poor standing tolerance, lethargic, fatigue. heart rate in the 80's and Sp02 in the upper 90's on 3.5L with standing   Stairs             Wheelchair Mobility    Modified Rankin (Stroke Patients Only)       Balance Overall balance assessment: Needs assistance Sitting-balance support: Feet unsupported, No upper extremity supported Sitting balance-Leahy Scale: Good   Postural control: Posterior lean Standing balance support: Bilateral upper extremity supported Standing balance-Leahy Scale: Poor Standing balance comment: external support required to maintain standing balance with posterior bias. standing tolerance of 20 seconds                            Cognition Arousal/Alertness: Lethargic Behavior During Therapy: Flat affect Overall Cognitive Status: Within Functional Limits for tasks assessed                                 General Comments: slow to process and needing cues for safety awareness        Exercises      General Comments        Pertinent Vitals/Pain Pain Assessment Pain  Assessment: No/denies pain    Home Living                          Prior Function            PT Goals (current goals can now be found in the care plan section) Acute Rehab PT Goals Patient Stated Goal: to go home PT Goal Formulation: With patient Time For Goal Achievement: 01/29/23 Potential to Achieve Goals: Fair Progress towards PT goals: Progressing toward goals    Frequency    Min 2X/week      PT Plan Current plan remains  appropriate    Co-evaluation              AM-PAC PT "6 Clicks" Mobility   Outcome Measure  Help needed turning from your back to your side while in a flat bed without using bedrails?: None Help needed moving from lying on your back to sitting on the side of a flat bed without using bedrails?: A Little Help needed moving to and from a bed to a chair (including a wheelchair)?: A Little Help needed standing up from a chair using your arms (e.g., wheelchair or bedside chair)?: A Little Help needed to walk in hospital room?: A Little Help needed climbing 3-5 steps with a railing? : A Lot 6 Click Score: 18    End of Session Equipment Utilized During Treatment: Oxygen Activity Tolerance: Patient limited by fatigue Patient left: in chair;with call bell/phone within reach (set-up with lunch)   PT Visit Diagnosis: Unsteadiness on feet (R26.81);Muscle weakness (generalized) (M62.81)     Time: [id number] PT Time Calculation (min) (ACUTE ONLY): 16 min  Charges:  $Therapeutic Activity: 8-22 mins                     [name], PT, MPT    [name] 01/17/2023, 2:35 PM

## 2023-01-17 NOTE — ED Notes (Signed)
Pt spoke with three different family members and relayed that he was wet and had been wet all day long due to his pure wick leaking. This nurse checked the patient and he is in fact NOT wet. Pt states he doesn't understand why he feels wet. I let him know that I wasn't sure sure why he felt that way but that he was completely dry and that he should call his family back and make them aware since they had called the hospital upset because he said he was wet.

## 2023-01-17 NOTE — ED Notes (Signed)
[facility] per Hospitalist note

## 2023-01-17 NOTE — ED Notes (Signed)
Pt significant other called this nurse requesting to speak with the patient. This nurse dialed her number for patient.

## 2023-01-17 NOTE — Progress Notes (Signed)
[place]  [phone] [phone]  ID: Flint Melter OB: [birth year]  MR#: [id number]  [id number]  Patient Care Team: Jodi Marble, MD as PCP - General (Internal Medicine) Perrin Maltese, MD as Referring Physician (Internal Medicine)  CHIEF COMPLAINT: Thrombocytopenia.  INTERVAL HISTORY: Patient up in chair eating lunch today.  Appears more alert and responding to questions appropriately.  He offers no specific complaints.  REVIEW OF SYSTEMS:   Review of Systems  Constitutional:  Positive for malaise/fatigue. Negative for fever and weight loss.  Respiratory: Negative.  Negative for cough, hemoptysis and shortness of breath.   Cardiovascular: Negative.  Negative for chest pain and leg swelling.  Gastrointestinal: Negative.  Negative for abdominal pain, blood in stool and melena.  Genitourinary: Negative.  Negative for hematuria.  Musculoskeletal: Negative.  Negative for joint pain.  Skin: Negative.  Negative for rash.  Neurological:  Positive for weakness. Negative for dizziness, focal weakness and headaches.  Endo/Heme/Allergies:  Does not bruise/bleed easily.  Psychiatric/Behavioral: Negative.  The patient is not nervous/anxious.     As per HPI. Otherwise, a complete review of systems is negative.  PAST MEDICAL HISTORY: Past Medical History:  Diagnosis Date   A-fib ([place])    Anxiety    CHF (congestive heart failure) (HCC)    Depression    Hypersomnia with sleep apnea    Hypertension    Morbid obesity ([place])    NSTEMI (non-ST elevated myocardial infarction) ([place]) 2005   Obesity hypoventilation syndrome (HCC)    Shortness of breath dyspnea    Sleep apnea    Stroke ([place])    V-tach ([place])     PAST SURGICAL HISTORY: Past Surgical History:  Procedure Laterality Date   LEFT HEART CATH AND CORONARY ANGIOGRAPHY Right 09/10/2018   Procedure: LEFT HEART CATH AND CORONARY ANGIOGRAPHY with possible percutaneous  intervention;  Surgeon: Dionisio David, MD;  Location: [facility];  Service: Cardiovascular;  Laterality: Right;   TRACHEOSTOMY      FAMILY HISTORY: Family History  Problem Relation Age of Onset   Heart disease Father    Diabetes type II Mother    Breast cancer Mother     ADVANCED DIRECTIVES (Y/N):  @ADVDIR$ @  HEALTH MAINTENANCE: Social History   Tobacco Use   Smoking status: Every Day    Packs/day: 0.25    Years: 28.00    Total pack years: 7.00    Types: Cigarettes   Smokeless tobacco: Never  Vaping Use   Vaping Use: Never used  Substance Use Topics   Alcohol use: Yes    Alcohol/week: 4.0 standard drinks of alcohol    Types: 4 Cans of beer per week    Comment: 1/5 of whiskey dailty for last 5 days   Drug use: Yes    Types: Marijuana     Colonoscopy:  PAP:  Bone density:  Lipid panel:  No Known Allergies  Current Facility-Administered Medications  Medication Dose Route Frequency Provider Last Rate Last Admin   acetaminophen (TYLENOL) tablet 650 mg  650 mg Oral Q6H PRN Lorella Nimrod, MD   650 mg at 01/17/23 [id number]   Or   acetaminophen (TYLENOL) suppository 650 mg  650 mg Rectal Q6H PRN Lorella Nimrod, MD       amiodarone (PACERONE) tablet 200 mg  200 mg Oral Daily Lorella Nimrod, MD   200 mg at 01/17/23 0911   amLODipine (NORVASC) tablet 5 mg  5 mg Oral Daily Lorella Nimrod, MD  5 mg at 01/16/23 0920   Ampicillin-Sulbactam (UNASYN) 3 g in sodium chloride 0.9 % 100 mL IVPB  3 g Intravenous Q6H Lorella Nimrod, MD   Stopped at 01/17/23 0720   azithromycin (ZITHROMAX) 500 mg in sodium chloride 0.9 % 250 mL IVPB  500 mg Intravenous Q24H Lorella Nimrod, MD   Stopped at [id number]   folic acid (FOLVITE) tablet 1 mg  1 mg Oral Daily Lorella Nimrod, MD   1 mg at 01/17/23 0911   furosemide (LASIX) tablet 40 mg  40 mg Oral Daily Lorella Nimrod, MD   40 mg at 01/17/23 0911   gabapentin (NEURONTIN) capsule 400 mg  400 mg Oral TID Lorella Nimrod, MD   400 mg at 01/17/23 1029    ipratropium-albuterol (DUONEB) 0.5-2.5 (3) MG/3ML nebulizer solution 3 mL  3 mL Nebulization Q6H Lorella Nimrod, MD   3 mL at 01/17/23 0917   levETIRAcetam (KEPPRA) tablet 500 mg  500 mg Oral BID Lorella Nimrod, MD   500 mg at 01/17/23 0911   LORazepam (ATIVAN) injection 0-4 mg  0-4 mg Intravenous Q12H Vladimir Crofts, MD   1 mg at 01/16/23 1540   Or   LORazepam (ATIVAN) tablet 0-4 mg  0-4 mg Oral Q12H Vladimir Crofts, MD   1 mg at 01/17/23 0726   melatonin tablet 5 mg  5 mg Oral QHS Bennett, Christal H, NP   5 mg at 01/16/23 2203   metoprolol tartrate (LOPRESSOR) tablet 100 mg  100 mg Oral BID Lorella Nimrod, MD   100 mg at 01/16/23 2203   nicotine (NICODERM CQ - dosed in mg/24 hours) patch 14 mg  14 mg Transdermal Daily Lorella Nimrod, MD   14 mg at 01/17/23 0913   ondansetron (ZOFRAN) tablet 4 mg  4 mg Oral Q6H PRN Lorella Nimrod, MD       Or   ondansetron (ZOFRAN) injection 4 mg  4 mg Intravenous Q6H PRN Lorella Nimrod, MD   4 mg at 01/16/23 [id number]   patiromer (VELTASSA) packet 8.4 g  8.4 g Oral Once Lorella Nimrod, MD       polyethylene glycol (MIRALAX / GLYCOLAX) packet 17 g  17 g Oral Daily PRN Lorella Nimrod, MD       thiamine (VITAMIN B1) tablet 100 mg  100 mg Oral Daily Vladimir Crofts, MD   100 mg at 01/17/23 [id number]   Or   thiamine (VITAMIN B1) injection 100 mg  100 mg Intravenous Daily Vladimir Crofts, MD   100 mg at 01/14/23 [id number]   Current Outpatient Medications  Medication Sig Dispense Refill   albuterol (VENTOLIN HFA) 108 (90 Base) MCG/ACT inhaler Inhale 1-2 puffs into the lungs as directed.     amiodarone (PACERONE) 200 MG tablet Take 1 tablet (200 mg total) by mouth daily. 30 tablet 0   amLODipine (NORVASC) 2.5 MG tablet Take 2.5 mg by mouth daily.     chlorthalidone (HYGROTON) 25 MG tablet Take 1 tablet (25 mg total) by mouth daily. 30 tablet 0   FLUoxetine (PROZAC) 40 MG capsule Take 40 mg by mouth every morning.     gabapentin (NEURONTIN) 400 MG capsule Take 400 mg by mouth 3 (three) times  daily.     hydrALAZINE (APRESOLINE) 100 MG tablet Take 1 tablet (100 mg total) by mouth 3 (three) times daily. 90 tablet 0   levETIRAcetam (KEPPRA) 500 MG tablet Take 1 tablet (500 mg total) by mouth 2 (two) times daily. 60 tablet 0   lisinopril (ZESTRIL)  40 MG tablet Take 40 mg by mouth daily.     metoprolol tartrate (LOPRESSOR) 50 MG tablet Take 1 tablet (50 mg total) by mouth 2 (two) times daily. 60 tablet 0   rosuvastatin (CRESTOR) 20 MG tablet Take 1 tablet (20 mg total) by mouth at bedtime. 30 tablet 0   valsartan (DIOVAN) 320 MG tablet Take 1 tablet (320 mg total) by mouth every morning. 30 tablet 0   amLODipine (NORVASC) 5 MG tablet Take 1 tablet (5 mg total) by mouth daily. 30 tablet 0   apixaban (ELIQUIS) 5 MG TABS tablet Take 1 tablet (5 mg total) by mouth 2 (two) times daily. 60 tablet 0   buPROPion (WELLBUTRIN XL) 150 MG 24 hr tablet Take 1 tablet (150 mg total) by mouth daily. 30 tablet 0   furosemide (LASIX) 40 MG tablet Take 1 tablet (40 mg total) by mouth daily. 30 tablet 0   QUEtiapine (SEROQUEL) 100 MG tablet Take 1 tablet (100 mg total) by mouth at bedtime. 30 tablet 0   venlafaxine XR (EFFEXOR-XR) 75 MG 24 hr capsule Take 1 capsule (75 mg total) by mouth daily with breakfast. 30 capsule 0    OBJECTIVE: Vitals:   01/17/23 1040 01/17/23 1516  BP: 92/73   Pulse: 77   Resp: 18 13  Temp:    SpO2: 95% 97%     Body mass index is 33.47 kg/m.    ECOG FS:1 - Symptomatic but completely ambulatory  General: Well-developed, well-nourished, no acute distress. Eyes: Pink conjunctiva, anicteric sclera. HEENT: Normocephalic, moist mucous membranes. Lungs: No audible wheezing or coughing. Heart: Regular rate and rhythm. Abdomen: Soft, nontender, no obvious distention. Musculoskeletal: No edema, cyanosis, or clubbing. Neuro: Alert, answering all questions appropriately. Cranial nerves grossly intact. Skin: No rashes or petechiae noted. Psych: Normal affect.  LAB RESULTS:  Lab  Results  Component Value Date   NA 134 (L) 01/17/2023   K 5.3 (H) 01/17/2023   CL 97 (L) 01/17/2023   CO2 27 01/17/2023   GLUCOSE 115 (H) 01/17/2023   BUN 28 (H) 01/17/2023   CREATININE 1.17 01/17/2023   CALCIUM 8.0 (L) 01/17/2023   PROT 7.0 01/15/2023   ALBUMIN 3.1 (L) 01/15/2023   AST 36 01/15/2023   ALT 17 01/15/2023   ALKPHOS 66 01/15/2023   BILITOT 1.6 (H) 01/15/2023   GFRNONAA >60 01/17/2023   GFRAA >60 05/12/2020    Lab Results  Component Value Date   WBC 4.8 01/17/2023   NEUTROABS 4.8 01/14/2023   HGB 10.2 (L) 01/17/2023   HCT 32.7 (L) 01/17/2023   MCV 109.0 (H) 01/17/2023   PLT 55 (L) 01/17/2023     STUDIES: NM Hepatobiliary Liver Func  Addendum Date: 01/16/2023   ADDENDUM REPORT: 01/16/2023 16:13 ADDENDUM: Dose: [id number] millicuries technetium 99 Choletec. Electronically Signed   By: Ronney Asters M.D.   On: 01/16/2023 16:13   Result Date: 01/16/2023 CLINICAL DATA:  Cholelithiasis. EXAM: NUCLEAR MEDICINE HEPATOBILIARY IMAGING TECHNIQUE: Sequential images of the abdomen were obtained out to 60 minutes following intravenous administration of radiopharmaceutical. RADIOPHARMACEUTICALS:  mCi Tc-59m Choletec IV COMPARISON:  CT chest 01/16/2023.  Abdominal ultrasound 01/16/2023. FINDINGS: Prompt uptake and biliary excretion of activity by the liver is seen. Gallbladder activity is visualized, consistent with patency of cystic duct. Biliary activity passes into small bowel, consistent with patent common bile duct. Although an ejection fraction was not calculated, there is significant retention of radiotracer within distended gallbladder on 60 minute images. IMPRESSION: 1.  No evidence  for biliary duct obstruction. 2. Significant retention of radiotracer within distended gallbladder at 60 minutes can be seen in the setting of functional gallbladder dysfunction. Electronically Signed: By: Ronney Asters M.D. On: 01/16/2023 15:51   CT Angio Chest Pulmonary Embolism (PE) W or WO  Contrast  Result Date: 01/16/2023 CLINICAL DATA:  Respiratory failure EXAM: CT ANGIOGRAPHY CHEST WITH CONTRAST TECHNIQUE: Multidetector CT imaging of the chest was performed using the standard protocol during bolus administration of intravenous contrast. Multiplanar CT image reconstructions and MIPs were obtained to evaluate the vascular anatomy. Severe motion artifact significantly limits this examination. RADIATION DOSE REDUCTION: This exam was performed according to the departmental dose-optimization program which includes automated exposure control, adjustment of the mA and/or kV according to patient size and/or use of iterative reconstruction technique. CONTRAST:  75 mL OMNIPAQUE IOHEXOL 350 MG/ML SOLN COMPARISON:  01/14/2023 FINDINGS: Cardiovascular: Note is made of cardiomegaly. No pericardial effusion identified. The study is nondiagnostic for PE due to motion artifact. Consider a V/Q scan for further assessment for PE. There is evidence of reflux of contrast into the IVC consistent with tricuspid insufficiency. Aorta demonstrates atheromatous changes and no evidence of significant aneurysmal dilatation. Mediastinum/Nodes: Left prevascular node measures 1.2 cm. Right paratracheal node measures 0.9 cm. Unremarkable appearance of thyroid and upper mediastinal structures and esophagus. Lungs/Pleura: Small pleural effusion on the right. There is suggestion of diffuse alveolar opacity consistent with pulmonary edema. Evaluation was limited by severe motion artifact. Upper Abdomen: No acute abnormality. Musculoskeletal: No chest wall abnormality. No acute or significant osseous findings. Review of the MIP images confirms the above findings. IMPRESSION: 1. This is a very limited study due to motion artifact. 2. No gross PE identified, but this study is nondiagnostic. Consider V/Q scan for further assessment. 3. Small pleural effusion on the right. 4. Diffuse bilateral pulmonary alveolar opacity consistent with  pulmonary edema. A bilateral pneumonia could also be considered. 5. Cardiomegaly. Electronically Signed   By: Sammie Bench M.D.   On: 01/16/2023 14:06   US Abdomen Limited RUQ (LIVER/GB)  Result Date: 01/16/2023 CLINICAL DATA:  Altered mental status, thrombocytopenia, alcohol abuse, rule out cirrhosis EXAM: ULTRASOUND ABDOMEN LIMITED RIGHT UPPER QUADRANT COMPARISON:  None Available. FINDINGS: Gallbladder: Small gallstones. Gallbladder wall thickening up to 0.4 cm. Small volume pericholecystic fluid. No sonographic Murphy sign noted by sonographer. Common bile duct: Diameter: 0.3 cm Liver: No focal lesion identified. Increased parenchymal echogenicity. Portal vein is patent on color Doppler imaging with normal direction of blood flow towards the liver. Other: None. IMPRESSION: 1. Cholelithiasis with gallbladder wall thickening and pericholecystic fluid. No sonographic Murphy sign. Findings are equivocal for acute cholecystitis. Consider further evaluation with nuclear medicine HIDA scan. 2. Hepatic steatosis. 3. No overt cirrhotic morphology. Electronically Signed   By: Delanna Ahmadi M.D.   On: 01/16/2023 12:42   US Venous Img Lower Bilateral (DVT)  Result Date: 01/16/2023 CLINICAL DATA:  Elevated D-dimer level. EXAM: BILATERAL LOWER EXTREMITY VENOUS DOPPLER ULTRASOUND TECHNIQUE: Gray-scale sonography with graded compression, as well as color Doppler and duplex ultrasound were performed to evaluate the lower extremity deep venous systems from the level of the common femoral vein and including the common femoral, femoral, profunda femoral, popliteal and calf veins including the posterior tibial, peroneal and gastrocnemius veins when visible. The superficial great saphenous vein was also interrogated. Spectral Doppler was utilized to evaluate flow at rest and with distal augmentation maneuvers in the common femoral, femoral and popliteal veins. COMPARISON:  None Available. FINDINGS: RIGHT LOWER  EXTREMITY  Common Femoral Vein: No evidence of thrombus. Normal compressibility, respiratory phasicity and response to augmentation. Saphenofemoral Junction: No evidence of thrombus. Normal compressibility and flow on color Doppler imaging. Profunda Femoral Vein: No evidence of thrombus. Normal compressibility and flow on color Doppler imaging. Femoral Vein: No evidence of thrombus. Normal compressibility, respiratory phasicity and response to augmentation. Popliteal Vein: No evidence of thrombus. Normal compressibility, respiratory phasicity and response to augmentation. Calf Veins: No evidence of thrombus. Normal compressibility and flow on color Doppler imaging. Superficial Great Saphenous Vein: No evidence of thrombus. Normal compressibility. Venous Reflux:  None. Other Findings:  None. LEFT LOWER EXTREMITY Common Femoral Vein: No evidence of thrombus. Normal compressibility, respiratory phasicity and response to augmentation. Saphenofemoral Junction: No evidence of thrombus. Normal compressibility and flow on color Doppler imaging. Profunda Femoral Vein: No evidence of thrombus. Normal compressibility and flow on color Doppler imaging. Femoral Vein: No evidence of thrombus. Normal compressibility, respiratory phasicity and response to augmentation. Popliteal Vein: No evidence of thrombus. Normal compressibility, respiratory phasicity and response to augmentation. Calf Veins: No evidence of thrombus. Normal compressibility and flow on color Doppler imaging. Superficial Great Saphenous Vein: No evidence of thrombus. Normal compressibility. Venous Reflux:  None. Other Findings: Probable effusion or Baker's cyst is seen in the medial portion of the left knee. IMPRESSION: No evidence of deep venous thrombosis in either lower extremity. Electronically Signed   By: Marijo Conception M.D.   On: 01/16/2023 12:36   CT Chest Wo Contrast  Result Date: 01/14/2023 CLINICAL DATA:  Shortness of breath with cough. EXAM: CT CHEST WITHOUT  CONTRAST TECHNIQUE: Multidetector CT imaging of the chest was performed following the standard protocol without IV contrast. RADIATION DOSE REDUCTION: This exam was performed according to the departmental dose-optimization program which includes automated exposure control, adjustment of the mA and/or kV according to patient size and/or use of iterative reconstruction technique. COMPARISON:  05/12/2020 FINDINGS: Cardiovascular: The heart is markedly enlarged. Coronary artery calcification is evident. Mild atherosclerotic calcification is noted in the wall of the thoracic aorta. Enlargement of the pulmonary outflow tract/main pulmonary arteries suggests pulmonary arterial hypertension. Mediastinum/Nodes: Scattered upper normal mediastinal lymph nodes evident. No evidence for gross hilar lymphadenopathy although assessment is limited by the lack of intravenous contrast on the current study. The esophagus has normal imaging features. There is no axillary lymphadenopathy. Lungs/Pleura: Interlobular septal thickening noted in the upper lobes bilaterally. Fine detail of lung parenchyma in the lower chest bilaterally obscured by breathing motion during image acquisition. Within this limitation, there is consolidative airspace disease in the posterior right middle lobe and most prominently in the inferior right lower lobe. Similar but more modest airspace disease identified in the posterior left costophrenic sulcus. No pleural effusion. No suspicious pulmonary nodule or mass. Upper Abdomen: The liver shows diffusely decreased attenuation suggesting fat deposition. Tiny layering calcified gallstones evident. Musculoskeletal: No worrisome lytic or sclerotic osseous abnormality. Mild superior endplate compression deformity noted at L2. IMPRESSION: 1. Consolidative airspace disease in the posterior right middle lobe and most prominently in the inferior right lower lobe. Similar but more modest airspace disease identified in the  posterior left costophrenic sulcus. Imaging features are compatible with multifocal pneumonia. Aspiration could have this appearance in the appropriate clinical setting. Dependent edema considered a less likely possibility by imaging. 2. Marked cardiomegaly with interlobular septal thickening in the upper lobes bilaterally. 3. Enlargement of the pulmonary outflow tract/main pulmonary arteries compatible with pulmonary arterial hypertension. 4. Hepatic steatosis. 5. Cholelithiasis. 6.  Aortic Atherosclerosis (ICD10-I70.0). Electronically Signed   By: Misty Stanley M.D.   On: 01/14/2023 06:10   DG Chest Portable 1 View  Result Date: 01/14/2023 CLINICAL DATA:  seizure vs syncope. hypoxic CHF patient. eval infiltrate, signs of aspiration EXAM: PORTABLE CHEST 1 VIEW.  Patient is rotated COMPARISON:  Chest x-ray 10/29/2022 FINDINGS: The heart and mediastinal contours are unchanged. Slightly prominent left hilar vasculature which may be due to patient rotation. Right base and costophrenic angle collimated off view. No definite focal consolidation. Question slightly increased markings. No significant pleural effusion. No pneumothorax. No acute osseous abnormality. IMPRESSION: Limited evaluation due to patient rotation. Recommend repeat PA and lateral view of the chest. Electronically Signed   By: Iven Finn M.D.   On: 01/14/2023 03:40   CT HEAD WO CONTRAST (5MM)  Result Date: 01/14/2023 CLINICAL DATA:  fall, syncope vs seizure EXAM: CT HEAD WITHOUT CONTRAST TECHNIQUE: Contiguous axial images were obtained from the base of the skull through the vertex without intravenous contrast. RADIATION DOSE REDUCTION: This exam was performed according to the departmental dose-optimization program which includes automated exposure control, adjustment of the mA and/or kV according to patient size and/or use of iterative reconstruction technique. COMPARISON:  CT head 10/29/2022, CT head 04/20/2020 FINDINGS: Brain: Cerebral  ventricle sizes are concordant with the degree of cerebral volume loss. Similar-appearing right frontotemporal and right cerebellar encephalomalacia. Patchy and confluent areas of decreased attenuation are noted throughout the deep and periventricular white matter of the cerebral hemispheres bilaterally, compatible with chronic microvascular ischemic disease. No evidence of large-territorial acute infarction. No parenchymal hemorrhage. No mass lesion. No extra-axial collection. No mass effect or midline shift. No hydrocephalus. Basilar cisterns are patent. Vascular: No hyperdense vessel. Skull: No acute fracture or focal lesion. Sinuses/Orbits: Left maxillary sinus mucosal thickening. Otherwise paranasal sinuses and mastoid air cells are clear. The orbits are unremarkable. Other: None. IMPRESSION: No acute intracranial abnormality. Electronically Signed   By: Iven Finn M.D.   On: 01/14/2023 03:39    ASSESSMENT: Thrombocytopenia.  PLAN:    Thrombocytopenia: Likely secondary to heavy alcohol use, although patient had normal platelet count in November 2023.  CT scan of the abdomen on November 01, 2022 did not report any splenomegaly.  B12 levels and iron panel are within normal limits, folate is only mildly decreased.  Antiplatelet abilities are pending at time of dictation.  Platelets mildly improved to 55. Heavy alcohol use, suicidal ideation: Psychiatry has been consulted. Anticoagulation: Okay to reinitiate Eliquis if platelets remain persistently greater than 50. Elevated ferritin: Likely secondary as an acute phase reactant. Macrocytosis: Likely secondary to alcohol use.  B12 levels are normal, folate is mildly decreased. Anemia: Chronic and unchanged, monitor.  Iron stores are within normal limits.  Will follow.  Lloyd Huger, MD   01/17/2023 3:22 PM

## 2023-01-17 NOTE — ED Notes (Signed)
Pt on phone with Marguarite Arbour his sister now.[phone]

## 2023-01-17 NOTE — ED Notes (Signed)
Unable to complete CIWA due to pt sleeping

## 2023-01-17 NOTE — ED Notes (Signed)
Patient was repositioned in bed with pillows placed on his right side.

## 2023-01-17 NOTE — ED Notes (Signed)
Report to [place], rn.

## 2023-01-17 NOTE — Telephone Encounter (Signed)
Message sent via portal since we are unable to reach pt via phone

## 2023-01-17 NOTE — ED Notes (Signed)
The pt is complaining of a h/a and anxiety. Ciwa was completed and resulted a 10. Hospitalist was notified and approved an extra dose of Ativan.

## 2023-01-17 NOTE — Consult Note (Signed)
Pharmacy Antibiotic Note  Andrew Sparks is a 57 y.o. male with history of seizures, alcohol use disorder, Afib, HTN, CAD, OSA, stroke, chronic respiratory failure on supplemental oxygen admitted on 01/14/2023 with  seizure like activity .  Pharmacy has been consulted for Unasyn dosing for aspiration pneumonia.  Plan:  Continue Unasyn 3 g IV q6h  Height: 5' 11"$  (180.3 cm) Weight: 108.9 kg (240 lb) IBW/kg (Calculated) : 75.3  Temp (24hrs), Avg:97.8 F (36.6 C), Min:97.4 F (36.3 C), Max:98 F (36.7 C)  Recent Labs  Lab 01/14/23 0400 01/14/23 0605 01/14/23 1434 01/14/23 1451 01/14/23 1709 01/15/23 0442 01/16/23 0501 01/17/23 0955  WBC 6.1  --   --  6.1  --  5.1 5.4 4.8  CREATININE 1.29*  --   --   --   --  0.95 0.95 1.17  LATICACIDVEN 3.4* 2.9* 1.7  --  1.7  --   --   --      Estimated Creatinine Clearance: 88.4 mL/min (by C-G formula based on SCr of 1.17 mg/dL).    No Known Allergies  Antimicrobials this admission: Unasyn 2/11 >>  Azithromycin 2/11>> Vancomycin 2/12 >>2/14  Dose adjustments this admission: N/A  Microbiology results: 2/11 BCx: staph epidermidis and staph lentus 2/12 bcx: ngtd 2/11 RVP: negative 2/11 Sputum: ordered   Thank you for allowing pharmacy to be a part of this patient's care.  Lorin Picket, PharmD 01/17/2023 1:49 PM

## 2023-01-17 NOTE — Progress Notes (Signed)
Progress Note   Patient: Andrew Sparks [id number] DOB: Apr 30, 1966 DOA: 01/14/2023     3 DOS: the patient was seen and examined on 01/17/2023   Brief hospital course:  Andrew Sparks is a 57 y.o. male with medical history significant of paroxysmal atrial fibrillation, chronic HFpEF, chronic respiratory failure on 2 to 3 L at home hypertension, alcohol abuse, seizure disorder and depression was brought to ED via EMS when he fell out of chair and had some seizure-like activity, no incontinence or tongue bite, he drank 2 pints of liquor before that.   Patient has stopped taking most of his home medications which includes Keppra, stating that he does not have any money.  ED course.  On arrival patient was in A-fib with RVR, tachypneic and hypoxic in mid 80s requiring up to 6 L of oxygen.  Labs pertinent for platelet count of 54, BUN 26, creatinine 1.29, total bilirubin 2.1 and AST of 50, BNP 227, lactic acid 3.4>>2.9.  Respiratory panel negative for COVID, RSV and influenza.  Ethanol markedly elevated at 419. Chest x-ray with limited information so CT chest without contrast was obtained which shows consolidative airspace disease in the posterior right middle lobe, inferior right lower lobe and posterior left costophrenic sulcus.  Features compatible with multifocal pneumonia and concern of aspiration.  Also concern of pulmonary hypertension, hepatic steatosis and cholelithiasis along with aortic atherosclerosis.   Patient received Keppra load and started on Unasyn for concern of aspiration pneumonia.  Also started on CIWA protocol for concern of alcohol abuse.  2/12: Patient remained mildly tachycardic and tachypneic, blood pressure at 152/124-increasing the dose of metoprolol to 100 mg twice daily.  Preliminary blood cultures with 1 set positive for MRSE, most likely a contaminant, vancomycin was added, ordered repeat blood cultures this morning but patient already received loading  dose of vancomycin around 4 AM this morning.  Procalcitonin remains stable at 0.11 and no leukocytosis.  Gradual worsening of thrombocytopenia, platelets at 44.  AST normalized and T. bili improving.  AKI resolved. Mildly elevated LDH.  Checking some more labs to find a cause of thrombocytopenia, also consulted hematology-Dr Maryjane Hurter will see the patient. Patient also expressed some suicidal thoughts without any plan overnight-psych was consulted for more clarification as patient has an history of prior suicidal thoughts and was being treated at geropsych department last year.  2/13: Continue to have heart rate in low 100, blood pressure improved.  Labs with platelet stable at 45, magnesium 1.6 which is being repleted.  Procalcitonin normalized.  HIV and hep C negative, folate low at [id number] folic acid supplement.  Repeat preliminary blood cultures negative in 24-hour.  D-dimer elevated at 1.71 along with elevated fibrinogen at 514, Lower extremity venous Doppler was negative for DVT.  RUQ ultrasound shows cholelithiasis with concern of cholecystitis and hepatic steatosis-recommending HIDA scan which was ordered. Patient with little worsening of respiratory symptoms and tachypnea, although saturating well on his baseline oxygen requirement of 3 L.  CTA to rule out PE was also ordered. Per oncology they will try her to hold anticoagulation until platelets are persistently above 50.  2/14; patient with hypothermia and remained on 5 L of oxygen overnight.  HIDA scan was negative for any bile duct obstruction.  CTA was inconclusive due to motion artifact but did not show any large PE and they were recommending obtaining VQ scan but it cannot be done as he had a nuclear studies yesterday for HIDA scan. CT scan did show  bilateral infiltrate which can be due to pulmonary edema versus infection. Parvovirus labs with negative PCR and IgM and elevated IgG which shows a prior infection. Platelets slowly  improving, at 55 today.  Mild hyperkalemia with potassium at 5.3 today-ordered 1 dose of Veltassa.-Hypomagnesemia resolved. Repeat blood cultures remain negative, discontinuing vancomycin. Can restart Eliquis tomorrow if platelet remains above 50. Remained quite lethargic-PT is not recommending SNF.  Assessment and Plan: * Acute on chronic respiratory failure with hypoxia (HCC) Able to wean back to baseline oxygen but remained tachypneic. Most likely secondary to aspiration pneumonia.  Uses 3 to 4 L at baseline,.  He was hypoxic up to 86% on baseline in the ED. elevated D-dimer and lower extremity venous Doppler was negative for DVT.  CTA was mostly inconclusive and they were recommending VQ scan which cannot be done as he had his HIDA scan yesterday   Sepsis with acute hypoxic respiratory failure without septic shock [facility]) Patient met sepsis criteria with tachycardia, tachypnea, imaging concerning for pneumonia.  Elevated lactic acid at 3.4>>2.9>>1.7 and AKI.  Respiratory panel negative for COVID, influenza and RSV.  Patient was started on Unasyn for concern of aspiration most likely secondary to alcohol intoxication. 1 set of blood cultures with MRSE-most likely contaminant but vancomycin was also added.  Repeat blood cultures remain negative so vancomycin was discontinued. -Continue Unasyn and Zithromax-complete a 5-day course -Follow-up blood cultures -Follow strep pneumo and Legionella antigen. -Follow-up sputum cultures   Seizure-like activity (HCC) Concern of seizure at home by girlfriend, no incontinence or tongue bite.  No significant postictal state.  Patient has an history of seizure disorder and stopped taking his Keppra for about a month.  No seizure-like activity noted in ED Received loading dose of Keppra in ED. -Continue with home dose of Keppra  Thrombocytopenia ([place]) Patient with acute onset thrombocytopenia with platelet at 54>>44>>45.  No obvious bleeding.  LDH mildly  elevated.  Smear was negative for any abnormal morphology. Platelets normal 62-monthago.RUQ UK[place]egative for hepatics cirrhosis, can be due to alcohol intoxication. Checking some more labs to find out the cause of acute thrombocytopenia-pending results except negative HIV and hepatitis C.  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  Hematology was consulted -Holding home Eliquis-can be restarted once platelet persistently above 50 -Monitor platelet   Paroxysmal atrial fibrillation (HCC) EKG with A-fib and RVR.  Patient was not very compliant with his home amiodarone and metoprolol.  Heart rate with some improvement after increasing the dose of metoprolol -Continue with increased the dose of metoprolol at 100 mg twice daily -Continue amiodarone -Holding home Eliquis due to thrombocytopenia-we will restart once platelet counts persistently more than [id number] Chronic diastolic CHF (congestive heart failure) ([place] Echo done in November 2023 with normal EF and grade 1 diastolic dysfunction. BNP of 227.  1+ lower extremity edema.  Patient received 1 dose of IV Lasix -Restart home Lasix at 40 mg daily p.o. -Avoid IV fluid. -Continue to monitor  Alcohol abuse Markedly elevated ethanol levels on admission.  Patient uses liquor regularly. Counseling was provided. -CIWA protocol with Ativan  Essential hypertension Patient was on multiple antihypertensives at home which he was not taking it for the past couple of weeks.  Blood pressure now within goal after restarting home amlodipine. -Continue home amlodipine -We can restart ACE inhibitor if needed as AKI has been resolved. -Metoprolol dose was increased and restarted home Lasix  Major depressive disorder, recurrent severe without psychotic features (  [place]) Patient exhibits some suicidal thoughts overnight so he was IVC'ed. Denies any suicidal thoughts this morning. Psych consult for further evaluation Not taking his home  Seroquel and Effexor.   AKI (acute kidney injury) ([place]) Resolved with hydration Most likely secondary to sepsis.   -Monitor renal function -Avoid nephrotoxin  Tobacco abuse Counseling was provided. -Nicotine patch  Dyslipidemia -Continue statin   Subjective: Patient was feeling more tired and lethargic.  No other complaints.  Physical Exam: Vitals:   01/17/23 1027 01/17/23 1030 01/17/23 1040 01/17/23 1516  BP:  (!) 84/63 92/73   Pulse: 72  77   Resp: 16 15 18 13  $ Temp:      TempSrc:      SpO2: 96% 100% 95% 97%  Weight:      Height:       General.  Ill-appearing gentleman, in no acute distress. Pulmonary.  Lungs clear bilaterally, normal respiratory effort. CV.  Regular rate and rhythm, no JVD, rub or murmur. Abdomen.  Soft, nontender, nondistended, BS positive. CNS.  Lethargic.  No focal neurologic deficit. Extremities.  No edema, no cyanosis, pulses intact and symmetrical. Psychiatry.  Appears to have some cognitive impairment  Data Reviewed: Prior data reviewed  Family Communication: Discussed with significant other on phone.  Disposition: Status is: Inpatient Remains inpatient appropriate because: Severity of illness  Planned Discharge Destination: Home with Home Health  DVT prophylaxis.  Eliquis Time spent: 45 minutes  This record has been created using Systems analyst. Errors have been sought and corrected,but may not always be located. Such creation errors do not reflect on the standard of care.   Author: Lorella Nimrod, MD 01/17/2023 3:52 PM  For on call review [email or web address].

## 2023-01-17 NOTE — ED Notes (Addendum)
56/M  Sepsis w/ PNA Hx: CHF, chronic alcohol abuse, A fib, HTN Medical IVC 3L [place] at baseline (5L [place] when sleeping) Originally came in for seizure like activity Q12hr CIWA  BG checks every morning Uses [place] Ambulation is unknown

## 2023-01-17 NOTE — ED Notes (Signed)
Pt sister requesting a call from patient. Her number has been taken down and this nurse will dial for the patient.

## 2023-01-17 NOTE — TOC Progression Note (Signed)
Transition of Care [facility]) - Progression Note    Patient Details  Name: Andrew Sparks MRN: [id number] Date of Birth: [birth year]  Transition of Care [facility]) CM/SW Contact  Shelbie Hutching, RN Phone Number: 01/17/2023, 2:08 PM  Clinical Narrative:    PT and OT have updated their recommendations to SNF.  RNCM will begin bed search.    Expected Discharge Plan: [place] Barriers to Discharge: Continued Medical Work up  Expected Discharge Plan and Services   Discharge Planning Services: CM Consult Post Acute Care Choice: [place] arrangements for the past 2 months: Single Family Home                 DME Arranged: N/A DME Agency: NA       HH Arranged: RN, PT, OT HH Agency: [place] Date [place]: 01/16/23 Time [place]: 1127 Representative spoke with at [place]: [place]   Social Determinants of Health ([place]) Interventions SDOH Screenings   Alcohol Screen: High Risk (06/30/2022)  Tobacco Use: High Risk (01/14/2023)    Readmission Risk Interventions     No data to display

## 2023-01-18 ENCOUNTER — Other Ambulatory Visit: Payer: Self-pay

## 2023-01-18 DIAGNOSIS — R569 Unspecified convulsions: Secondary | ICD-10-CM | POA: Diagnosis not present

## 2023-01-18 DIAGNOSIS — N179 Acute kidney failure, unspecified: Secondary | ICD-10-CM | POA: Diagnosis not present

## 2023-01-18 DIAGNOSIS — J9621 Acute and chronic respiratory failure with hypoxia: Secondary | ICD-10-CM | POA: Diagnosis not present

## 2023-01-18 DIAGNOSIS — A419 Sepsis, unspecified organism: Secondary | ICD-10-CM | POA: Diagnosis not present

## 2023-01-18 LAB — CBC
HCT: 34.2 % — ABNORMAL LOW (ref 39.0–52.0)
Hemoglobin: 10.6 g/dL — ABNORMAL LOW (ref 13.0–17.0)
MCH: 33.9 pg (ref 26.0–34.0)
MCHC: 31 g/dL (ref 30.0–36.0)
MCV: 109.3 fL — ABNORMAL HIGH (ref 80.0–100.0)
Platelets: 64 10*3/uL — ABNORMAL LOW (ref 150–400)
RBC: 3.13 MIL/uL — ABNORMAL LOW (ref 4.22–5.81)
RDW: 16.6 % — ABNORMAL HIGH (ref 11.5–15.5)
WBC: 5.7 10*3/uL (ref 4.0–10.5)
nRBC: 1.2 % — ABNORMAL HIGH (ref 0.0–0.2)

## 2023-01-18 LAB — BASIC METABOLIC PANEL
Anion gap: 9 (ref 5–15)
BUN: 29 mg/dL — ABNORMAL HIGH (ref 6–20)
CO2: 28 mmol/L (ref 22–32)
Calcium: 8.2 mg/dL — ABNORMAL LOW (ref 8.9–10.3)
Chloride: 97 mmol/L — ABNORMAL LOW (ref 98–111)
Creatinine, Ser: 1.29 mg/dL — ABNORMAL HIGH (ref 0.61–1.24)
GFR, Estimated: >60 mL/min (ref >60)
Glucose, Bld: 95 mg/dL (ref 70–99)
Potassium: 3.8 mmol/L (ref 3.5–5.1)
Sodium: 134 mmol/L — ABNORMAL LOW (ref 135–145)

## 2023-01-18 LAB — CBG MONITORING, ED: Glucose-Capillary: 93 mg/dL (ref 70–99)

## 2023-01-18 MED ORDER — APIXABAN 5 MG PO TABS
5.0000 mg | ORAL_TABLET | Freq: Two times a day (BID) | ORAL | Status: DC
  Administered 2023-01-18 – 2023-01-24 (×13): 5 mg via ORAL
  Filled 2023-01-18 (×13): qty 1

## 2023-01-18 NOTE — Consult Note (Signed)
  Brief psychiatric follow-up: This is a follow-up to the psychiatric consult done 2 days ago.  At that time psychiatric team did not feel the patient required inpatient psychiatric treatment.  Patient is still in the hospital awaiting a medical bed placement.  Patient seen today.  He was alert calm and not agitated at all.  Completely denied any suicidal ideation.  Understands he is to be admitted to the hospital and is compliant with it.  No longer meets any commitment criteria.  I am going to discontinue the IVC paperwork.

## 2023-01-18 NOTE — Consult Note (Signed)
ANTICOAGULATION CONSULT NOTE - Initial Consult  Pharmacy Consult for apixaban Indication: atrial fibrillation  No Known Allergies  Patient Measurements: Height: 5' 11"$  (180.3 cm) Weight: 108.9 kg (240 lb) IBW/kg (Calculated) : 75.3   Vital Signs: Temp: 98 F (36.7 C) (02/15 0531) Temp Source: Oral (02/15 0531) BP: 111/97 (02/15 0902) Pulse Rate: 84 (02/15 0902)  Labs: Recent Labs    01/15/23 1532 01/16/23 0501 01/16/23 0501 01/17/23 0955 01/18/23 0518  HGB  --  10.8*   < > 10.2* 10.6*  HCT 31.4* 33.5*  --  32.7* 34.2*  PLT  --  45*  --  55* 64*  APTT 32  --   --   --   --   CREATININE  --  0.95  --  1.17 1.29*   < > = values in this interval not displayed.    Estimated Creatinine Clearance: 80.2 mL/min (A) (by C-G formula based on SCr of 1.29 mg/dL (H)).   Medical History: Past Medical History:  Diagnosis Date   A-fib ([place])    Anxiety    CHF (congestive heart failure) (HCC)    Depression    Hypersomnia with sleep apnea    Hypertension    Morbid obesity ([place])    NSTEMI (non-ST elevated myocardial infarction) ([place]) 2005   Obesity hypoventilation syndrome (HCC)    Shortness of breath dyspnea    Sleep apnea    Stroke ([place])    V-tach (HCC)     Medications:  No recent AC meds.   Assessment: Pharmacy consulted to dose apixaban in this 57 yo M with Afib.  Had previous orders for Eliquis and Xarelto but no dispense hx more recent than 09/2022.    Platelets this admission 54-->45>>55.  CBC ordered daily.  Goal of Therapy:  Monitor platelets by anticoagulation protocol: Yes   Plan:  Apixaban 5 mg PO BID  Alison Murray 01/18/2023,9:38 AM

## 2023-01-18 NOTE — ED Notes (Signed)
Informed RN bed assigned 

## 2023-01-18 NOTE — Assessment & Plan Note (Signed)
Concern of seizure at home by girlfriend, no incontinence or tongue bite.  No significant postictal state.  Patient has an history of seizure disorder and stopped taking his Keppra for about a month.  No seizure-like activity noted in ED.  Initially received loading dose and then continued on home dose of Keppra.

## 2023-01-18 NOTE — NC FL2 (Addendum)
[place] LEVEL OF CARE FORM     IDENTIFICATION  Patient Name: Andrew Sparks Birthdate: Dec 01, 1966 Sex: male Admission Date (Current Location): 01/14/2023  [facility] and [place] Number:  Engineering geologist and Address:  [facility], [address], [place], [place] [address]      Provider Number: [id number]  Attending Physician Name and Address:  Annita Brod, MD  Relative Name and Phone Number:  Kasheem Mazzarella- brother- [phone]    Current Level of Care: Hospital Recommended Level of Care: [place] Prior Approval Number:    Date Approved/Denied:   PASRR Number: [id number]  Discharge Plan: SNF    Current Diagnoses: Patient Active Problem List   Diagnosis Date Noted   Suicidal ideation 01/15/2023   Acute on chronic respiratory failure with hypoxia ([place]) 01/14/2023   Sepsis with acute hypoxic respiratory failure without septic shock ([place]) 01/14/2023   Thrombocytopenia ([place]) [id number]   Alcoholic intoxication with complication ([place]) [id number]   Hypertensive urgency 10/29/2022   Alcohol use disorder 10/29/2022   Hemiparesis affecting left side as late effect of cerebrovascular accident (CVA) ([place]) 10/29/2022   Seizure-like activity ([place]) 10/29/2022   Elevated troponin 10/29/2022   Hypotension 07/02/2022   Dyslipidemia 07/02/2022   Major depressive disorder, recurrent severe without psychotic features ([place]) 06/30/2022   Paroxysmal atrial fibrillation ([place])    Vitamin B12 deficiency    AKI (acute kidney injury) ([place]) 03/28/2021   Essential hypertension 03/28/2021   Hyponatremia 04/20/2020   Chronic diastolic CHF (congestive heart failure) (HCC)    Atrial fibrillation, chronic (HCC)    COPD with acute exacerbation ([place])    Depression    Alcohol abuse    Tobacco abuse    CHF (congestive heart failure) ([place]) 07/17/2017   Panic disorder 01/18/2017   Chest pain 01/17/2017   Obstructive sleep  apnea syndrome 02/01/2016    Orientation RESPIRATION BLADDER Height & Weight     Self, Time, Situation, Place  O2 (Chronic 3L) Incontinent, External catheter Weight: 108.9 kg Height:  5' 11"$  (180.3 cm)  BEHAVIORAL SYMPTOMS/MOOD NEUROLOGICAL BOWEL NUTRITION STATUS      Continent Diet (Heart Healthy)  AMBULATORY STATUS COMMUNICATION OF NEEDS Skin   Extensive Assist Verbally Normal                       Personal Care Assistance Level of Assistance  Bathing, Feeding, Dressing Bathing Assistance: Maximum assistance Feeding assistance: Limited assistance Dressing Assistance: Maximum assistance     Functional Limitations Info  Sight, Hearing, Speech Sight Info: Adequate Hearing Info: Adequate Speech Info: Adequate    SPECIAL CARE FACTORS FREQUENCY  PT (By licensed PT), OT (By licensed OT)     PT Frequency: 5 times per week OT Frequency: 5 times per week            Contractures Contractures Info: Not present    Additional Factors Info  Code Status, Allergies, Psychotropic Code Status Info: Full Allergies Info: NKA Psychotropic Info: hx major depression, suicidal ideation- Effexor, Seroquel         Current Medications (01/18/2023):  This is the current hospital active medication list Current Facility-Administered Medications  Medication Dose Route Frequency Provider Last Rate Last Admin   acetaminophen (TYLENOL) tablet 650 mg  650 mg Oral Q6H PRN Lorella Nimrod, MD   650 mg at 01/18/23 0859   Or   acetaminophen (TYLENOL) suppository 650 mg  650 mg Rectal Q6H PRN Lorella Nimrod, MD  amiodarone (PACERONE) tablet 200 mg  200 mg Oral Daily Lorella Nimrod, MD   200 mg at 01/18/23 0900   amLODipine (NORVASC) tablet 5 mg  5 mg Oral Daily Lorella Nimrod, MD   5 mg at 01/18/23 0859   Ampicillin-Sulbactam (UNASYN) 3 g in sodium chloride 0.9 % 100 mL IVPB  3 g Intravenous Q6H Lorella Nimrod, MD   Stopped at 01/18/23 0901   azithromycin (ZITHROMAX) 500 mg in sodium chloride  0.9 % 250 mL IVPB  500 mg Intravenous Q24H Lorella Nimrod, MD 250 mL/hr at 01/18/23 0925 500 mg at [id number]   folic acid (FOLVITE) tablet 1 mg  1 mg Oral Daily Lorella Nimrod, MD   1 mg at 01/18/23 0859   furosemide (LASIX) tablet 40 mg  40 mg Oral Daily Lorella Nimrod, MD   40 mg at 01/18/23 0859   gabapentin (NEURONTIN) capsule 400 mg  400 mg Oral TID Lorella Nimrod, MD   400 mg at 01/18/23 0901   ipratropium-albuterol (DUONEB) 0.5-2.5 (3) MG/3ML nebulizer solution 3 mL  3 mL Nebulization Q6H Lorella Nimrod, MD   3 mL at 01/18/23 0222   levETIRAcetam (KEPPRA) tablet 500 mg  500 mg Oral BID Lorella Nimrod, MD   500 mg at 01/18/23 0859   LORazepam (ATIVAN) injection 0-4 mg  0-4 mg Intravenous Q12H Vladimir Crofts, MD   1 mg at 01/16/23 1540   Or   LORazepam (ATIVAN) tablet 0-4 mg  0-4 mg Oral Q12H Vladimir Crofts, MD   1 mg at 01/18/23 0916   melatonin tablet 5 mg  5 mg Oral QHS Bennett, Christal H, NP   5 mg at 01/17/23 2228   metoprolol tartrate (LOPRESSOR) tablet 100 mg  100 mg Oral BID Lorella Nimrod, MD   100 mg at 01/18/23 0900   nicotine (NICODERM CQ - dosed in mg/24 hours) patch 14 mg  14 mg Transdermal Daily Lorella Nimrod, MD   14 mg at 01/18/23 0901   ondansetron (ZOFRAN) tablet 4 mg  4 mg Oral Q6H PRN Lorella Nimrod, MD       Or   ondansetron (ZOFRAN) injection 4 mg  4 mg Intravenous Q6H PRN Lorella Nimrod, MD   4 mg at 01/16/23 0759   polyethylene glycol (MIRALAX / GLYCOLAX) packet 17 g  17 g Oral Daily PRN Lorella Nimrod, MD       thiamine (VITAMIN B1) tablet 100 mg  100 mg Oral Daily Vladimir Crofts, MD   100 mg at 01/18/23 [id number]   Or   thiamine (VITAMIN B1) injection 100 mg  100 mg Intravenous Daily Vladimir Crofts, MD   100 mg at 01/14/23 [id number]   Current Outpatient Medications  Medication Sig Dispense Refill   albuterol (VENTOLIN HFA) 108 (90 Base) MCG/ACT inhaler Inhale 1-2 puffs into the lungs as directed.     amiodarone (PACERONE) 200 MG tablet Take 1 tablet (200 mg total) by mouth daily. 30  tablet 0   amLODipine (NORVASC) 2.5 MG tablet Take 2.5 mg by mouth daily.     chlorthalidone (HYGROTON) 25 MG tablet Take 1 tablet (25 mg total) by mouth daily. 30 tablet 0   FLUoxetine (PROZAC) 40 MG capsule Take 40 mg by mouth every morning.     gabapentin (NEURONTIN) 400 MG capsule Take 400 mg by mouth 3 (three) times daily.     hydrALAZINE (APRESOLINE) 100 MG tablet Take 1 tablet (100 mg total) by mouth 3 (three) times daily. 90 tablet 0   levETIRAcetam (KEPPRA)  500 MG tablet Take 1 tablet (500 mg total) by mouth 2 (two) times daily. 60 tablet 0   lisinopril (ZESTRIL) 40 MG tablet Take 40 mg by mouth daily.     metoprolol tartrate (LOPRESSOR) 50 MG tablet Take 1 tablet (50 mg total) by mouth 2 (two) times daily. 60 tablet 0   rosuvastatin (CRESTOR) 20 MG tablet Take 1 tablet (20 mg total) by mouth at bedtime. 30 tablet 0   valsartan (DIOVAN) 320 MG tablet Take 1 tablet (320 mg total) by mouth every morning. 30 tablet 0   amLODipine (NORVASC) 5 MG tablet Take 1 tablet (5 mg total) by mouth daily. 30 tablet 0   apixaban (ELIQUIS) 5 MG TABS tablet Take 1 tablet (5 mg total) by mouth 2 (two) times daily. 60 tablet 0   buPROPion (WELLBUTRIN XL) 150 MG 24 hr tablet Take 1 tablet (150 mg total) by mouth daily. 30 tablet 0   furosemide (LASIX) 40 MG tablet Take 1 tablet (40 mg total) by mouth daily. 30 tablet 0   QUEtiapine (SEROQUEL) 100 MG tablet Take 1 tablet (100 mg total) by mouth at bedtime. 30 tablet 0   venlafaxine XR (EFFEXOR-XR) 75 MG 24 hr capsule Take 1 capsule (75 mg total) by mouth daily with breakfast. 30 capsule 0     Discharge Medications: Please see discharge summary for a list of discharge medications.  Relevant Imaging Results:  Relevant Lab Results:   Additional Information SS# [id number]  Shelbie Hutching, RN

## 2023-01-18 NOTE — Assessment & Plan Note (Signed)
Meets criteria BMI greater than 30 

## 2023-01-18 NOTE — TOC Progression Note (Signed)
Transition of Care [facility]) - Progression Note    Patient Details  Name: Andrew Sparks MRN: [id number] Date of Birth: Nov 30, 1966  Transition of Care [facility]) CM/SW Contact  Shelbie Hutching, RN Phone Number: 01/18/2023, 9:34 AM  Clinical Narrative:    Physical therapy and occupational therapy have updated their recommendations to SNF.  Pasrr pending, bed search started.    Expected Discharge Plan: [place] Barriers to Discharge: Continued Medical Work up  Expected Discharge Plan and Services   Discharge Planning Services: CM Consult Post Acute Care Choice: [place] arrangements for the past 2 months: Single Family Home                 DME Arranged: N/A DME Agency: NA       HH Arranged: RN, PT, OT HH Agency: [place] Date [place]: 01/16/23 Time [place]: 1127 Representative spoke with at [place]: [place]   Social Determinants of Health ([place]) Interventions SDOH Screenings   Alcohol Screen: High Risk (06/30/2022)  Tobacco Use: High Risk (01/14/2023)    Readmission Risk Interventions     No data to display

## 2023-01-18 NOTE — Progress Notes (Signed)
Progress Note   Patient: Andrew Sparks [id number] DOB: 27-Mar-1966 DOA: 01/14/2023     4 DOS: the patient was seen and examined on 01/18/2023   Brief hospital course:  Andrew Sparks is a 57 y.o. male with medical history significant of paroxysmal atrial fibrillation, chronic HFpEF, chronic respiratory failure on 2 to 3 L at home hypertension, alcohol abuse, seizure disorder and depression was brought to ED via EMS when he fell out of chair and had reported seizure-like activity.  Patient states that he had stopped taking most of his home medications including his Keppra due to lack of money.   In the emergency room, patient noted to be hypoxic requiring 6 L of oxygen, atrial fibrillation with rapid ventricular rate and lab work noteworthy for alcohol level at 419 and lactic acid level 3.4.  Chest x-ray noted multilobar pneumonia with concerns for aspiration.  Patient started on IV Unasyn and Keppra.  Also noted to have thrombocytopenia.  Patient admitted to hospitalist service.  Hematology consulted for low platelets.  Patient also voiced some suicidal thoughts without plan and psychiatry consulted.  Over next few days, patient's symptoms improved.  Oxygenation improved down to baseline.  PE ruled out.  Hematology recommended restarting anticoagulation only after platelet count above 50, which was by 2/15.  Patient seen by physical therapy who recommended skilled nursing.  Psychiatry felt patient not a danger to himself and not a suicide risk.  Assessment and Plan: * Acute on chronic respiratory failure with hypoxia (HCC)-resolved as of 01/18/2023 Able to wean back to baseline oxygen but remained tachypneic. Secondary to aspiration pneumonia.  Uses 3 to 4 L at baseline,.  He was hypoxic up to 86% on baseline in the ED. elevated D-dimer and lower extremity venous Doppler was negative for DVT.  CTA was mostly inconclusive.  Initially unable to get VQ scan, but since then, patient's  oxygenation has much improved and he is not tachycardic and probability of PE much lower.   Sepsis with acute hypoxic respiratory failure without septic shock [facility]) Patient met severe sepsis criteria with tachycardia, tachypnea, imaging concerning for pneumonia.  Elevated lactic acid at 3.4>>2.9>>1.7 and AKI.  Respiratory panel negative for COVID, influenza and RSV.  Patient was started on Unasyn for concern of aspiration most likely secondary to alcohol intoxication. 1 set of blood cultures with MRSE-most likely contaminant but vancomycin was also added.  Repeat blood cultures remain negative so vancomycin was discontinued. -Continue Unasyn and Zithromax-complete a 5-day course -Follow-up blood cultures -Follow strep pneumo and Legionella antigen. -Follow-up sputum cultures   Seizure-like activity (HCC) Concern of seizure at home by girlfriend, no incontinence or tongue bite.  No significant postictal state.  Patient has an history of seizure disorder and stopped taking his Keppra for about a month.  No seizure-like activity noted in ED.  Initially received loading dose and then continued on home dose of Keppra.  Thrombocytopenia ([place]) Patient with acute onset thrombocytopenia with platelet at 54>>44>>45.  No obvious bleeding.  LDH mildly elevated.  Smear was negative for any abnormal morphology. Platelets normal 47-monthago.RUQ [place] for hepatics cirrhosis, can be due to alcohol intoxication. Checking some more labs to find out the cause of acute thrombocytopenia-pending results except negative HIV and hepatitis C.  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  Hematology was consulted.  Platelet count staying now above 50, Eliquis restarted as of 2/15  Paroxysmal atrial fibrillation (HCC) EKG with A-fib and RVR.  Patient was not very compliant with his home amiodarone and metoprolol.  Heart rate with some improvement after increasing the dose of metoprolol -Continue  with increased the dose of metoprolol at 100 mg twice daily -Continue amiodarone -Initially held home Eliquis due to thrombocytopenia-restarted on [id number]  Chronic diastolic CHF (congestive heart failure) ([place]) Echo done in November 2023 with normal EF and grade 1 diastolic dysfunction. BNP of 227.  1+ lower extremity edema.  Patient received 1 dose of IV Lasix -Restart home Lasix at 40 mg daily p.o. -Avoid IV fluid. -Continue to monitor  Alcohol abuse Markedly elevated ethanol levels on admission.  Patient uses liquor regularly. Counseling was provided. -CIWA protocol with Ativan  Essential hypertension Patient was on multiple antihypertensives at home which he was not taking it for the past couple of weeks.  Blood pressure now within goal after restarting home amlodipine. -Continue home amlodipine -We can restart ACE inhibitor if needed as AKI has been resolved. -Metoprolol dose was increased and restarted home Lasix  Major depressive disorder, recurrent severe without psychotic features [facility]) Patient exhibits some suicidal thoughts overnight so he was IVC'ed. Denies any suicidal thoughts this morning. Psych consult for further evaluation Not taking his home Seroquel and Effexor.   AKI (acute kidney injury) (HCC)-resolved as of 01/18/2023 Resolved with hydration Most likely secondary to sepsis.   -Monitor renal function -Avoid nephrotoxin  Tobacco abuse Counseling was provided. -Nicotine patch  Dyslipidemia -Continue statin  Obesity (BMI 30-39.9) Meets criteria BMI greater than 30   Subjective: Patient tired, otherwise no complaints  Physical Exam: Vitals:   01/18/23 1200 01/18/23 1230 01/18/23 1322 01/18/23 1415  BP: 96/78 104/86 (!) 118/97   Pulse: 77 72 81   Resp:  18 20   Temp:   97.6 F (36.4 C)   TempSrc:      SpO2: 99% 100% 100% 97%  Weight:      Height:       General.  Ill-appearing gentleman, in no acute distress. Pulmonary.  Clear to auscultation  bilaterally CV.  Regular rate and rhythm, no JVD, rub or murmur. Abdomen.  Soft, nontender, nondistended, positive bowel sounds CNS.  Lethargic.  No focal neurologic deficit. Extremities.  No clubbing or cyanosis, trace pitting edema Psychiatry.  Appears to have some cognitive impairment  Data Reviewed: Creatinine at 1.29.  Platelet count up to 64.  Family Communication: Significant other at the bedside.  Disposition: Status is: Inpatient Remains inpatient appropriate because: Severity of illness  Planned Discharge Destination: Skilled nursing  DVT prophylaxis.  Eliquis   Author: Annita Brod, MD 01/18/2023 4:26 PM  For on call review [email or web address].

## 2023-01-18 NOTE — TOC PASRR Note (Signed)
38 Day PASRR Note   Patient Details  Name: Andrew Sparks Date of Birth: 12-18-1965   Transition of Care [facility]) CM/SW Contact:    Shelbie Hutching, RN Phone Number: 01/18/2023, 9:32 AM  To Whom It May Concern:  Please be advised that this patient will require a short-term nursing home stay - anticipated 30 days or less for rehabilitation and strengthening.   The plan is for return home.

## 2023-01-19 DIAGNOSIS — J9621 Acute and chronic respiratory failure with hypoxia: Secondary | ICD-10-CM | POA: Diagnosis not present

## 2023-01-19 DIAGNOSIS — N179 Acute kidney failure, unspecified: Secondary | ICD-10-CM | POA: Diagnosis not present

## 2023-01-19 DIAGNOSIS — A419 Sepsis, unspecified organism: Secondary | ICD-10-CM | POA: Diagnosis not present

## 2023-01-19 DIAGNOSIS — R569 Unspecified convulsions: Secondary | ICD-10-CM | POA: Diagnosis not present

## 2023-01-19 LAB — CBC
HCT: 33.7 % — ABNORMAL LOW (ref 39.0–52.0)
Hemoglobin: 10.6 g/dL — ABNORMAL LOW (ref 13.0–17.0)
MCH: 34.1 pg — ABNORMAL HIGH (ref 26.0–34.0)
MCHC: 31.5 g/dL (ref 30.0–36.0)
MCV: 108.4 fL — ABNORMAL HIGH (ref 80.0–100.0)
Platelets: 84 10*3/uL — ABNORMAL LOW (ref 150–400)
RBC: 3.11 MIL/uL — ABNORMAL LOW (ref 4.22–5.81)
RDW: 16.8 % — ABNORMAL HIGH (ref 11.5–15.5)
WBC: 4.5 10*3/uL (ref 4.0–10.5)
nRBC: 1.1 % — ABNORMAL HIGH (ref 0.0–0.2)

## 2023-01-19 LAB — BASIC METABOLIC PANEL
Anion gap: 9 (ref 5–15)
BUN: 26 mg/dL — ABNORMAL HIGH (ref 6–20)
CO2: 29 mmol/L (ref 22–32)
Calcium: 8 mg/dL — ABNORMAL LOW (ref 8.9–10.3)
Chloride: 94 mmol/L — ABNORMAL LOW (ref 98–111)
Creatinine, Ser: 1.22 mg/dL (ref 0.61–1.24)
GFR, Estimated: >60 mL/min (ref >60)
Glucose, Bld: 92 mg/dL (ref 70–99)
Potassium: 3.6 mmol/L (ref 3.5–5.1)
Sodium: 132 mmol/L — ABNORMAL LOW (ref 135–145)

## 2023-01-19 LAB — GLUCOSE, CAPILLARY
Glucose-Capillary: 100 mg/dL — ABNORMAL HIGH (ref 70–99)
Glucose-Capillary: 114 mg/dL — ABNORMAL HIGH (ref 70–99)
Glucose-Capillary: 111 mg/dL — ABNORMAL HIGH (ref 70–99)

## 2023-01-19 MED ORDER — ALPRAZOLAM 0.5 MG PO TABS
0.5000 mg | ORAL_TABLET | Freq: Three times a day (TID) | ORAL | Status: DC | PRN
  Administered 2023-01-19 – 2023-01-22 (×3): 0.5 mg via ORAL
  Filled 2023-01-19 (×3): qty 1

## 2023-01-19 MED ORDER — IPRATROPIUM-ALBUTEROL 0.5-2.5 (3) MG/3ML IN SOLN
3.0000 mL | Freq: Three times a day (TID) | RESPIRATORY_TRACT | Status: DC
  Administered 2023-01-19: 3 mL via RESPIRATORY_TRACT
  Filled 2023-01-19: qty 3

## 2023-01-19 MED ORDER — IPRATROPIUM-ALBUTEROL 0.5-2.5 (3) MG/3ML IN SOLN
3.0000 mL | Freq: Two times a day (BID) | RESPIRATORY_TRACT | Status: DC
  Administered 2023-01-19 – 2023-01-24 (×10): 3 mL via RESPIRATORY_TRACT
  Filled 2023-01-19 (×4): qty 3
  Filled 2023-01-19: qty 30
  Filled 2023-01-19 (×5): qty 3

## 2023-01-19 MED ORDER — QUETIAPINE FUMARATE 25 MG PO TABS
50.0000 mg | ORAL_TABLET | Freq: Every day | ORAL | Status: DC
  Administered 2023-01-19 – 2023-01-23 (×5): 50 mg via ORAL
  Filled 2023-01-19 (×5): qty 2

## 2023-01-19 NOTE — Care Management Important Message (Signed)
Important Message  Patient Details  Name: Andrew Sparks MRN: [id number] Date of Birth: 09-Jun-1966   Medicare Important Message Given:  Yes     Juliann Pulse A Kyara Boxer 01/19/2023, 11:25 AM

## 2023-01-19 NOTE — Progress Notes (Signed)
S:  currently reports increasing anxiety unrelieved by xanax given at 1852.  Reports also being recently diagnosed Schizophrenia (Denies AVH or paranoia).  B:  57 y.o. male with medical history significant of PAF, chronic HFpEF, chronic respiratory failure on 2 to 3 L at home hypertension, alcohol abuse, seizure disorder and depression was brought to ED via EMS when he fell out of chair and had reported seizure-like activity.  Patient states that he had stopped taking most of his home medications including his Keppra due to lack of money.   In the emergency room, patient noted to be hypoxic requiring 6 L of oxygen, atrial fibrillation with rapid ventricular rate and lab work noteworthy for alcohol level at 419 and lactic acid level 3.4.  Chest x-ray noted multilobar pneumonia with concerns for aspiration.  Patient started on IV Unasyn and Keppra.  Also noted to have thrombocytopenia.   Patient admitted to hospitalist service.  Hematology consulted for low platelets.  Patient also voiced some suicidal thoughts without plan and psychiatry consulted.  Over next few days, patient's symptoms improved.  Oxygenation improved down to baseline.  PE ruled out.  Hematology recommended restarting anticoagulation only after platelet count above 50, which was by 2/15.  Patient seen by physical therapy who recommended skilled nursing.  Psychiatry felt patient not a danger to himself and not a suicide risk.  A:  CIWA 3 at 1927 only scored for anxiety and agitation. Has melatonin scheduled 2200.  Too soon for second dose of Xanax?  R:  Has Seroquel 131m HS on PTA meds. Can he have half a one time dose tonight?  NKA  On call notified via secure chat. New order received, restarted seroquel at 50

## 2023-01-19 NOTE — Progress Notes (Signed)
       CROSS COVER NOTE  NAME: Andrew Sparks MRN: [id number] DOB : 1965/12/24    HPI/Events of Note   Nurse reports patient with continued anxiety despite xanax given and previously on seroquel at bedtime  Assessment and  Interventions   Assessment: Med review Plan: Resumed seroquel at half home dose 50 mg Q HS Other home meds antidepressants to be reviedwed/resumed per day team/psych following       Kathlene Cote NP [facility]

## 2023-01-19 NOTE — Progress Notes (Signed)
Progress Note   Patient: Andrew Sparks [id number] DOB: Aug 30, 1966 DOA: 01/14/2023     5 DOS: the patient was seen and examined on 01/19/2023   Brief hospital course:  Andrew Sparks is a 57 y.o. male with medical history significant of paroxysmal atrial fibrillation, chronic HFpEF, chronic respiratory failure on 2 to 3 L at home hypertension, alcohol abuse, seizure disorder and depression was brought to ED via EMS when he fell out of chair and had reported seizure-like activity.  Patient states that he had stopped taking most of his home medications including his Keppra due to lack of money.   In the emergency room, patient noted to be hypoxic requiring 6 L of oxygen, atrial fibrillation with rapid ventricular rate and lab work noteworthy for alcohol level at 419 and lactic acid level 3.4.  Chest x-ray noted multilobar pneumonia with concerns for aspiration.  Patient started on IV Unasyn and Keppra.  Also noted to have thrombocytopenia.  Patient admitted to hospitalist service.  Hematology consulted for low platelets.  Patient also voiced some suicidal thoughts without plan and psychiatry consulted.  Over next few days, patient's symptoms improved.  Oxygenation improved down to baseline.  PE ruled out.  Hematology recommended restarting anticoagulation only after platelet count above 50, which was by 2/15.  Patient seen by physical therapy who recommended skilled nursing.  Psychiatry felt patient not a danger to himself and not a suicide risk.  Assessment and Plan: * Acute on chronic respiratory failure with hypoxia (HCC)-resolved as of 01/18/2023 Able to wean back to baseline oxygen but remained tachypneic. Secondary to aspiration pneumonia.  Uses 3 to 4 L at baseline,.  He was hypoxic up to 86% on baseline in the ED. elevated D-dimer and lower extremity venous Doppler was negative for DVT.  CTA was mostly inconclusive.  Initially unable to get VQ scan, but since then, patient's  oxygenation has much improved and he is not tachycardic and probability of PE much lower.   Sepsis with acute hypoxic respiratory failure without septic shock [facility]) Patient met severe sepsis criteria with tachycardia, tachypnea, imaging concerning for pneumonia.  Elevated lactic acid at 3.4>>2.9>>1.7 and AKI.  Respiratory panel negative for COVID, influenza and RSV.  Patient was started on Unasyn for concern of aspiration most likely secondary to alcohol intoxication. 1 set of blood cultures with MRSE-most likely contaminant but vancomycin was also added.  Repeat blood cultures remain negative so vancomycin was discontinued. -Continue Unasyn and Zithromax-complete a 5-day course -Follow-up blood cultures (1 blood culture with Staph epidermidis, likely contaminant) -Follow strep pneumo and Legionella antigen. -Follow-up sputum cultures   Seizure-like activity (HCC) Concern of seizure at home by girlfriend, no incontinence or tongue bite.  No significant postictal state.  Patient has an history of seizure disorder and stopped taking his Keppra for about a month.  No seizure-like activity noted in ED.  Initially received loading dose and then continued on home dose of Keppra.  Thrombocytopenia ([place]) Patient with acute onset thrombocytopenia with platelet at 54>>44>>45.  No obvious bleeding.  LDH mildly elevated.  Smear was negative for any abnormal morphology. Platelets normal 24-monthago.RUQ [place] for hepatics cirrhosis, can be due to alcohol intoxication. Checking some more labs to find out the cause of acute thrombocytopenia-pending results except negative HIV and hepatitis C.  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  Hematology was consulted.  Platelet count staying now above 50, Eliquis restarted as of 2/15, and platelet count  still steadily improving.  Paroxysmal atrial fibrillation (HCC) EKG with A-fib and RVR.  Patient was not very compliant with his home  amiodarone and metoprolol.  Heart rate with some improvement after increasing the dose of metoprolol -Continue with increased the dose of metoprolol at 100 mg twice daily -Continue amiodarone -Initially held home Eliquis due to thrombocytopenia-restarted on 123456  Chronic diastolic CHF (congestive heart failure) ([place]) Echo done in November 2023 with normal EF and grade 1 diastolic dysfunction. BNP of 227.  1+ lower extremity edema.  Patient received 1 dose of IV Lasix -Restart home Lasix at 40 mg daily p.o. -Avoid IV fluid. -Continue to monitor  Alcohol abuse Markedly elevated ethanol levels on admission.  Patient uses liquor regularly. Counseling was provided. -CIWA protocol with Ativan  Essential hypertension Patient was on multiple antihypertensives at home which he was not taking it for the past couple of weeks.  Blood pressure now within goal after restarting home amlodipine. -Continue home amlodipine -We can restart ACE inhibitor if needed as AKI has been resolved. -Metoprolol dose was increased and restarted home Lasix  Major depressive disorder, recurrent severe without psychotic features [facility]) Patient exhibits some suicidal thoughts overnight so he was IVC'ed. Denies any suicidal thoughts this morning. Psych consult for further evaluation Not taking his home Seroquel and Effexor.   AKI (acute kidney injury) (HCC)-resolved as of 01/18/2023 Resolved with hydration Most likely secondary to sepsis.   -Monitor renal function -Avoid nephrotoxin  Tobacco abuse Counseling was provided. -Nicotine patch  Dyslipidemia -Continue statin  Obesity (BMI 30-39.9) Meets criteria BMI greater than 30   Subjective: Patient feels a little weak, otherwise no complaints  Physical Exam: Vitals:   01/19/23 0507 01/19/23 0756 01/19/23 0837 01/19/23 1212  BP: 109/88 (!) 116/93  107/89  Pulse: 72 84  72  Resp: 18 16 20 18  $ Temp: 97.9 F (36.6 C) (!) 97.4 F (36.3 C)  (!) 97.5 F  (36.4 C)  TempSrc:      SpO2: 99% 100%  100%  Weight:      Height:       General.  Oriented x 2, in no acute distress. Pulmonary.  Clear to auscultation bilaterally CV.  Regular rate and rhythm, no JVD, rub or murmur. Abdomen.  Soft, nontender, nondistended, positive bowel sounds CNS.  Lethargic.  No focal neurologic deficit. Extremities.  No clubbing or cyanosis, trace pitting edema Psychiatry.  Appears to have some cognitive impairment  Data Reviewed: Creatinine up to 1.22, platelet count 84  Family Communication: Significant other at the bedside.  Disposition: Status is: Inpatient Remains inpatient appropriate because: Awaiting bed availability  Planned Discharge Destination: Skilled nursing  DVT prophylaxis.  Eliquis   Author: Annita Brod, MD 01/19/2023 1:44 PM  For on call review [email or web address].

## 2023-01-19 NOTE — Progress Notes (Signed)
Physical Therapy Treatment Patient Details Name: Andrew Sparks MRN: [id number] DOB: 02/13/1966 Today's Date: 01/19/2023   History of Present Illness Patient is a 57 year old male with concern for seizure at home. Acute on chronic respiratory failure with hypoxia, sepsis due to pneumonia. Medical history significant of paroxysmal atrial fibrillation, chronic HFpEF, hypertension, alcohol abuse, seizure disorder and depression.    PT Comments    Patient received sleeping in bed. He is able to rouse to my voice. But continues to appear lethargic throughout session. He is mod I for bed mobility and transfers to recliner with mod +1 assist. He is too lethargic to safely attempt gait at this time. Patient will continue to benefit from skilled PT to improve strength, safety and activity tolerance.      Recommendations for follow up therapy are one component of a multi-disciplinary discharge planning process, led by the attending physician.  Recommendations may be updated based on patient status, additional functional criteria and insurance authorization.  Follow Up Recommendations  Skilled nursing-short term rehab (<3 hours/day) Can patient physically be transported by private vehicle: No   Assistance Recommended at Discharge Frequent or constant Supervision/Assistance  Patient can return home with the following A lot of help with walking and/or transfers;A lot of help with bathing/dressing/bathroom;Help with stairs or ramp for entrance;Assist for transportation;Assistance with cooking/housework;Direct supervision/assist for medications management   Equipment Recommendations  None recommended by PT    Recommendations for Other Services       Precautions / Restrictions Precautions Precautions: Fall Restrictions Weight Bearing Restrictions: No     Mobility  Bed Mobility Overal bed mobility: Modified Independent Bed Mobility: Supine to Sit     Supine to sit: Supervision           Transfers Overall transfer level: Needs assistance Equipment used: 1 person hand held assist Transfers: Sit to/from Stand, Bed to chair/wheelchair/BSC Sit to Stand: Min assist Stand pivot transfers: Min assist              Ambulation/Gait               General Gait Details: not attempted due to patient lethargy.   Stairs             Wheelchair Mobility    Modified Rankin (Stroke Patients Only)       Balance Overall balance assessment: Needs assistance Sitting-balance support: Feet supported Sitting balance-Leahy Scale: Fair     Standing balance support: Single extremity supported, During functional activity Standing balance-Leahy Scale: Fair                              Cognition Arousal/Alertness: Lethargic Behavior During Therapy: Flat affect Overall Cognitive Status: Difficult to assess                                 General Comments: quite lethargic, limited responses to questions and difficult to understand at times.        Exercises      General Comments        Pertinent Vitals/Pain Pain Assessment Pain Assessment: No/denies pain    Home Living                          Prior Function            PT Goals (current goals can  now be found in the care plan section) Acute Rehab PT Goals Patient Stated Goal: to go home PT Goal Formulation: With patient Time For Goal Achievement: 01/29/23 Potential to Achieve Goals: Fair Progress towards PT goals: Progressing toward goals    Frequency    Min 2X/week      PT Plan Current plan remains appropriate    Co-evaluation              AM-PAC PT "6 Clicks" Mobility   Outcome Measure  Help needed turning from your back to your side while in a flat bed without using bedrails?: A Little Help needed moving from lying on your back to sitting on the side of a flat bed without using bedrails?: A Little Help needed moving to and from a  bed to a chair (including a wheelchair)?: A Little Help needed standing up from a chair using your arms (e.g., wheelchair or bedside chair)?: A Little Help needed to walk in hospital room?: Total Help needed climbing 3-5 steps with a railing? : Total 6 Click Score: 14    End of Session Equipment Utilized During Treatment: Oxygen Activity Tolerance: Patient limited by fatigue Patient left: in chair;with call bell/phone within reach;with chair alarm set Nurse Communication: Mobility status PT Visit Diagnosis: Unsteadiness on feet (R26.81);Muscle weakness (generalized) (M62.81)     Time: [id number] PT Time Calculation (min) (ACUTE ONLY): 13 min  Charges:  $Therapeutic Activity: 8-22 mins                     Ambika Zettlemoyer, PT, GCS 01/19/23,1:17 PM

## 2023-01-19 NOTE — Progress Notes (Signed)
[place]  [phone] [phone]  ID: Flint Melter OB: 03-Apr-1966  MR#: [id number]  [id number]  Patient Care Team: Jodi Marble, MD as PCP - General (Internal Medicine) Perrin Maltese, MD as Referring Physician (Internal Medicine)  CHIEF COMPLAINT: Thrombocytopenia.  INTERVAL HISTORY:   Patient eating lunch and chair with girlfriend at bedside.  Alert and oriented.  He offers no specific complaints.  REVIEW OF SYSTEMS:   Review of Systems  Constitutional:  Positive for malaise/fatigue. Negative for fever and weight loss.  Respiratory: Negative.  Negative for cough, hemoptysis and shortness of breath.   Cardiovascular: Negative.  Negative for chest pain and leg swelling.  Gastrointestinal: Negative.  Negative for abdominal pain, blood in stool and melena.  Genitourinary: Negative.  Negative for hematuria.  Musculoskeletal: Negative.  Negative for joint pain.  Skin: Negative.  Negative for rash.  Neurological:  Positive for weakness. Negative for dizziness, focal weakness and headaches.  Endo/Heme/Allergies:  Does not bruise/bleed easily.  Psychiatric/Behavioral: Negative.  The patient is not nervous/anxious.     As per HPI. Otherwise, a complete review of systems is negative.  PAST MEDICAL HISTORY: Past Medical History:  Diagnosis Date   A-fib ([place])    Anxiety    CHF (congestive heart failure) (HCC)    Depression    Hypersomnia with sleep apnea    Hypertension    Morbid obesity ([address])    NSTEMI (non-ST elevated myocardial infarction) ([place]) 2005   Obesity hypoventilation syndrome (HCC)    Shortness of breath dyspnea    Sleep apnea    Stroke ([place])    V-tach ([place])     PAST SURGICAL HISTORY: Past Surgical History:  Procedure Laterality Date   LEFT HEART CATH AND CORONARY ANGIOGRAPHY Right 09/10/2018   Procedure: LEFT HEART CATH AND CORONARY ANGIOGRAPHY with possible percutaneous intervention;  Surgeon: Dionisio David, MD;  Location: [facility];  Service: Cardiovascular;  Laterality: Right;   TRACHEOSTOMY      FAMILY HISTORY: Family History  Problem Relation Age of Onset   Heart disease Father    Diabetes type II Mother    Breast cancer Mother     ADVANCED DIRECTIVES (Y/N):  @ADVDIR$ @  HEALTH MAINTENANCE: Social History   Tobacco Use   Smoking status: Every Day    Packs/day: 0.25    Years: 28.00    Total pack years: 7.00    Types: Cigarettes   Smokeless tobacco: Never  Vaping Use   Vaping Use: Never used  Substance Use Topics   Alcohol use: Yes    Alcohol/week: 4.0 standard drinks of alcohol    Types: 4 Cans of beer per week    Comment: 1/5 of whiskey dailty for last 5 days   Drug use: Yes    Types: Marijuana     Colonoscopy:  PAP:  Bone density:  Lipid panel:  No Known Allergies  Current Facility-Administered Medications  Medication Dose Route Frequency Provider Last Rate Last Admin   acetaminophen (TYLENOL) tablet 650 mg  650 mg Oral Q6H PRN Lorella Nimrod, MD   650 mg at 01/18/23 [id number]   Or   acetaminophen (TYLENOL) suppository 650 mg  650 mg Rectal Q6H PRN Lorella Nimrod, MD       amiodarone (PACERONE) tablet 200 mg  200 mg Oral Daily Lorella Nimrod, MD   200 mg at 01/19/23 0847   amLODipine (NORVASC) tablet 5 mg  5 mg Oral Daily Lorella Nimrod, MD   5  mg at 01/19/23 0847   Ampicillin-Sulbactam (UNASYN) 3 g in sodium chloride 0.9 % 100 mL IVPB  3 g Intravenous Q6H Nazari, Walid A, RPH 200 mL/hr at 01/19/23 1341 3 g at 01/19/23 1341   apixaban (ELIQUIS) tablet 5 mg  5 mg Oral BID Alison Murray, RPH   5 mg at 0000000 [id number]   folic acid (FOLVITE) tablet 1 mg  1 mg Oral Daily Lorella Nimrod, MD   1 mg at 01/19/23 0847   furosemide (LASIX) tablet 40 mg  40 mg Oral Daily Lorella Nimrod, MD   40 mg at 01/19/23 0847   gabapentin (NEURONTIN) capsule 400 mg  400 mg Oral TID Lorella Nimrod, MD   400 mg at 01/19/23 1533   ipratropium-albuterol (DUONEB) 0.5-2.5 (3) MG/3ML  nebulizer solution 3 mL  3 mL Nebulization BID Annita Brod, MD       levETIRAcetam (KEPPRA) tablet 500 mg  500 mg Oral BID Lorella Nimrod, MD   500 mg at 01/19/23 0847   melatonin tablet 5 mg  5 mg Oral QHS Bennett, Christal H, NP   5 mg at 01/18/23 2206   metoprolol tartrate (LOPRESSOR) tablet 100 mg  100 mg Oral BID Lorella Nimrod, MD   100 mg at 01/19/23 0847   nicotine (NICODERM CQ - dosed in mg/24 hours) patch 14 mg  14 mg Transdermal Daily Lorella Nimrod, MD   14 mg at 01/19/23 0847   ondansetron (ZOFRAN) tablet 4 mg  4 mg Oral Q6H PRN Lorella Nimrod, MD       Or   ondansetron (ZOFRAN) injection 4 mg  4 mg Intravenous Q6H PRN Lorella Nimrod, MD   4 mg at 01/16/23 0759   polyethylene glycol (MIRALAX / GLYCOLAX) packet 17 g  17 g Oral Daily PRN Lorella Nimrod, MD       thiamine (VITAMIN B1) tablet 100 mg  100 mg Oral Daily Vladimir Crofts, MD   100 mg at 01/19/23 0847   Or   thiamine (VITAMIN B1) injection 100 mg  100 mg Intravenous Daily Vladimir Crofts, MD   100 mg at 01/14/23 0939    OBJECTIVE: Vitals:   01/19/23 0837 01/19/23 1212  BP:  107/89  Pulse:  72  Resp: 20 18  Temp:  (!) 97.5 F (36.4 C)  SpO2:  100%     Body mass index is 33.7 kg/m.    ECOG FS:1 - Symptomatic but completely ambulatory  General: Well-developed, well-nourished, no acute distress. Eyes: Pink conjunctiva, anicteric sclera. HEENT: Normocephalic, moist mucous membranes. Lungs: No audible wheezing or coughing. Heart: Regular rate and rhythm. Abdomen: Soft, nontender, no obvious distention. Musculoskeletal: No edema, cyanosis, or clubbing. Neuro: Alert, answering all questions appropriately. Cranial nerves grossly intact. Skin: No rashes or petechiae noted. Psych: Normal affect.  LAB RESULTS:  Lab Results  Component Value Date   NA 132 (L) 01/19/2023   K 3.6 01/19/2023   CL 94 (L) 01/19/2023   CO2 29 01/19/2023   GLUCOSE 92 01/19/2023   BUN 26 (H) 01/19/2023   CREATININE 1.22 01/19/2023   CALCIUM  8.0 (L) 01/19/2023   PROT 7.0 01/15/2023   ALBUMIN 3.1 (L) 01/15/2023   AST 36 01/15/2023   ALT 17 01/15/2023   ALKPHOS 66 01/15/2023   BILITOT 1.6 (H) 01/15/2023   GFRNONAA >60 01/19/2023   GFRAA >60 05/12/2020    Lab Results  Component Value Date   WBC 4.5 01/19/2023   NEUTROABS 4.8 01/14/2023   HGB 10.6 (L)  01/19/2023   HCT 33.7 (L) 01/19/2023   MCV 108.4 (H) 01/19/2023   PLT 84 (L) 01/19/2023     STUDIES: NM Hepatobiliary Liver Func  Addendum Date: 01/16/2023   ADDENDUM REPORT: 01/16/2023 16:13 ADDENDUM: Dose: [id number] millicuries technetium 99 Choletec. Electronically Signed   By: Ronney Asters M.D.   On: 01/16/2023 16:13   Result Date: 01/16/2023 CLINICAL DATA:  Cholelithiasis. EXAM: NUCLEAR MEDICINE HEPATOBILIARY IMAGING TECHNIQUE: Sequential images of the abdomen were obtained out to 60 minutes following intravenous administration of radiopharmaceutical. RADIOPHARMACEUTICALS:  mCi Tc-78m Choletec IV COMPARISON:  CT chest 01/16/2023.  Abdominal ultrasound 01/16/2023. FINDINGS: Prompt uptake and biliary excretion of activity by the liver is seen. Gallbladder activity is visualized, consistent with patency of cystic duct. Biliary activity passes into small bowel, consistent with patent common bile duct. Although an ejection fraction was not calculated, there is significant retention of radiotracer within distended gallbladder on 60 minute images. IMPRESSION: 1.  No evidence for biliary duct obstruction. 2. Significant retention of radiotracer within distended gallbladder at 60 minutes can be seen in the setting of functional gallbladder dysfunction. Electronically Signed: By: ARonney AstersM.D. On: 01/16/2023 15:51   CT Angio Chest Pulmonary Embolism (PE) W or WO Contrast  Result Date: 01/16/2023 CLINICAL DATA:  Respiratory failure EXAM: CT ANGIOGRAPHY CHEST WITH CONTRAST TECHNIQUE: Multidetector CT imaging of the chest was performed using the standard protocol during bolus  administration of intravenous contrast. Multiplanar CT image reconstructions and MIPs were obtained to evaluate the vascular anatomy. Severe motion artifact significantly limits this examination. RADIATION DOSE REDUCTION: This exam was performed according to the departmental dose-optimization program which includes automated exposure control, adjustment of the mA and/or kV according to patient size and/or use of iterative reconstruction technique. CONTRAST:  75 mL OMNIPAQUE IOHEXOL 350 MG/ML SOLN COMPARISON:  01/14/2023 FINDINGS: Cardiovascular: Note is made of cardiomegaly. No pericardial effusion identified. The study is nondiagnostic for PE due to motion artifact. Consider a V/Q scan for further assessment for PE. There is evidence of reflux of contrast into the IVC consistent with tricuspid insufficiency. Aorta demonstrates atheromatous changes and no evidence of significant aneurysmal dilatation. Mediastinum/Nodes: Left prevascular node measures 1.2 cm. Right paratracheal node measures 0.9 cm. Unremarkable appearance of thyroid and upper mediastinal structures and esophagus. Lungs/Pleura: Small pleural effusion on the right. There is suggestion of diffuse alveolar opacity consistent with pulmonary edema. Evaluation was limited by severe motion artifact. Upper Abdomen: No acute abnormality. Musculoskeletal: No chest wall abnormality. No acute or significant osseous findings. Review of the MIP images confirms the above findings. IMPRESSION: 1. This is a very limited study due to motion artifact. 2. No gross PE identified, but this study is nondiagnostic. Consider V/Q scan for further assessment. 3. Small pleural effusion on the right. 4. Diffuse bilateral pulmonary alveolar opacity consistent with pulmonary edema. A bilateral pneumonia could also be considered. 5. Cardiomegaly. Electronically Signed   By: JSammie BenchM.D.   On: 01/16/2023 14:06   UKoreaAbdomen Limited RUQ (LIVER/GB)  Result Date:  01/16/2023 CLINICAL DATA:  Altered mental status, thrombocytopenia, alcohol abuse, rule out cirrhosis EXAM: ULTRASOUND ABDOMEN LIMITED RIGHT UPPER QUADRANT COMPARISON:  None Available. FINDINGS: Gallbladder: Small gallstones. Gallbladder wall thickening up to 0.4 cm. Small volume pericholecystic fluid. No sonographic Murphy sign noted by sonographer. Common bile duct: Diameter: 0.3 cm Liver: No focal lesion identified. Increased parenchymal echogenicity. Portal vein is patent on color Doppler imaging with normal direction of blood flow towards the liver. Other: None.  IMPRESSION: 1. Cholelithiasis with gallbladder wall thickening and pericholecystic fluid. No sonographic Murphy sign. Findings are equivocal for acute cholecystitis. Consider further evaluation with nuclear medicine HIDA scan. 2. Hepatic steatosis. 3. No overt cirrhotic morphology. Electronically Signed   By: Delanna Ahmadi M.D.   On: 01/16/2023 12:42   US Venous Img Lower Bilateral (DVT)  Result Date: 01/16/2023 CLINICAL DATA:  Elevated D-dimer level. EXAM: BILATERAL LOWER EXTREMITY VENOUS DOPPLER ULTRASOUND TECHNIQUE: Gray-scale sonography with graded compression, as well as color Doppler and duplex ultrasound were performed to evaluate the lower extremity deep venous systems from the level of the common femoral vein and including the common femoral, femoral, profunda femoral, popliteal and calf veins including the posterior tibial, peroneal and gastrocnemius veins when visible. The superficial great saphenous vein was also interrogated. Spectral Doppler was utilized to evaluate flow at rest and with distal augmentation maneuvers in the common femoral, femoral and popliteal veins. COMPARISON:  None Available. FINDINGS: RIGHT LOWER EXTREMITY Common Femoral Vein: No evidence of thrombus. Normal compressibility, respiratory phasicity and response to augmentation. Saphenofemoral Junction: No evidence of thrombus. Normal compressibility and flow on  color Doppler imaging. Profunda Femoral Vein: No evidence of thrombus. Normal compressibility and flow on color Doppler imaging. Femoral Vein: No evidence of thrombus. Normal compressibility, respiratory phasicity and response to augmentation. Popliteal Vein: No evidence of thrombus. Normal compressibility, respiratory phasicity and response to augmentation. Calf Veins: No evidence of thrombus. Normal compressibility and flow on color Doppler imaging. Superficial Great Saphenous Vein: No evidence of thrombus. Normal compressibility. Venous Reflux:  None. Other Findings:  None. LEFT LOWER EXTREMITY Common Femoral Vein: No evidence of thrombus. Normal compressibility, respiratory phasicity and response to augmentation. Saphenofemoral Junction: No evidence of thrombus. Normal compressibility and flow on color Doppler imaging. Profunda Femoral Vein: No evidence of thrombus. Normal compressibility and flow on color Doppler imaging. Femoral Vein: No evidence of thrombus. Normal compressibility, respiratory phasicity and response to augmentation. Popliteal Vein: No evidence of thrombus. Normal compressibility, respiratory phasicity and response to augmentation. Calf Veins: No evidence of thrombus. Normal compressibility and flow on color Doppler imaging. Superficial Great Saphenous Vein: No evidence of thrombus. Normal compressibility. Venous Reflux:  None. Other Findings: Probable effusion or Baker's cyst is seen in the medial portion of the left knee. IMPRESSION: No evidence of deep venous thrombosis in either lower extremity. Electronically Signed   By: Marijo Conception M.D.   On: 01/16/2023 12:36   CT Chest Wo Contrast  Result Date: 01/14/2023 CLINICAL DATA:  Shortness of breath with cough. EXAM: CT CHEST WITHOUT CONTRAST TECHNIQUE: Multidetector CT imaging of the chest was performed following the standard protocol without IV contrast. RADIATION DOSE REDUCTION: This exam was performed according to the departmental  dose-optimization program which includes automated exposure control, adjustment of the mA and/or kV according to patient size and/or use of iterative reconstruction technique. COMPARISON:  05/12/2020 FINDINGS: Cardiovascular: The heart is markedly enlarged. Coronary artery calcification is evident. Mild atherosclerotic calcification is noted in the wall of the thoracic aorta. Enlargement of the pulmonary outflow tract/main pulmonary arteries suggests pulmonary arterial hypertension. Mediastinum/Nodes: Scattered upper normal mediastinal lymph nodes evident. No evidence for gross hilar lymphadenopathy although assessment is limited by the lack of intravenous contrast on the current study. The esophagus has normal imaging features. There is no axillary lymphadenopathy. Lungs/Pleura: Interlobular septal thickening noted in the upper lobes bilaterally. Fine detail of lung parenchyma in the lower chest bilaterally obscured by breathing motion during image acquisition. Within this limitation,  there is consolidative airspace disease in the posterior right middle lobe and most prominently in the inferior right lower lobe. Similar but more modest airspace disease identified in the posterior left costophrenic sulcus. No pleural effusion. No suspicious pulmonary nodule or mass. Upper Abdomen: The liver shows diffusely decreased attenuation suggesting fat deposition. Tiny layering calcified gallstones evident. Musculoskeletal: No worrisome lytic or sclerotic osseous abnormality. Mild superior endplate compression deformity noted at L2. IMPRESSION: 1. Consolidative airspace disease in the posterior right middle lobe and most prominently in the inferior right lower lobe. Similar but more modest airspace disease identified in the posterior left costophrenic sulcus. Imaging features are compatible with multifocal pneumonia. Aspiration could have this appearance in the appropriate clinical setting. Dependent edema considered a less  likely possibility by imaging. 2. Marked cardiomegaly with interlobular septal thickening in the upper lobes bilaterally. 3. Enlargement of the pulmonary outflow tract/main pulmonary arteries compatible with pulmonary arterial hypertension. 4. Hepatic steatosis. 5. Cholelithiasis. 6.  Aortic Atherosclerosis (ICD10-I70.0). Electronically Signed   By: Misty Stanley M.D.   On: 01/14/2023 06:10   DG Chest Portable 1 View  Result Date: 01/14/2023 CLINICAL DATA:  seizure vs syncope. hypoxic CHF patient. eval infiltrate, signs of aspiration EXAM: PORTABLE CHEST 1 VIEW.  Patient is rotated COMPARISON:  Chest x-ray 10/29/2022 FINDINGS: The heart and mediastinal contours are unchanged. Slightly prominent left hilar vasculature which may be due to patient rotation. Right base and costophrenic angle collimated off view. No definite focal consolidation. Question slightly increased markings. No significant pleural effusion. No pneumothorax. No acute osseous abnormality. IMPRESSION: Limited evaluation due to patient rotation. Recommend repeat PA and lateral view of the chest. Electronically Signed   By: Iven Finn M.D.   On: 01/14/2023 03:40   CT HEAD WO CONTRAST (5MM)  Result Date: 01/14/2023 CLINICAL DATA:  fall, syncope vs seizure EXAM: CT HEAD WITHOUT CONTRAST TECHNIQUE: Contiguous axial images were obtained from the base of the skull through the vertex without intravenous contrast. RADIATION DOSE REDUCTION: This exam was performed according to the departmental dose-optimization program which includes automated exposure control, adjustment of the mA and/or kV according to patient size and/or use of iterative reconstruction technique. COMPARISON:  CT head 10/29/2022, CT head 04/20/2020 FINDINGS: Brain: Cerebral ventricle sizes are concordant with the degree of cerebral volume loss. Similar-appearing right frontotemporal and right cerebellar encephalomalacia. Patchy and confluent areas of decreased attenuation are  noted throughout the deep and periventricular white matter of the cerebral hemispheres bilaterally, compatible with chronic microvascular ischemic disease. No evidence of large-territorial acute infarction. No parenchymal hemorrhage. No mass lesion. No extra-axial collection. No mass effect or midline shift. No hydrocephalus. Basilar cisterns are patent. Vascular: No hyperdense vessel. Skull: No acute fracture or focal lesion. Sinuses/Orbits: Left maxillary sinus mucosal thickening. Otherwise paranasal sinuses and mastoid air cells are clear. The orbits are unremarkable. Other: None. IMPRESSION: No acute intracranial abnormality. Electronically Signed   By: Iven Finn M.D.   On: 01/14/2023 03:39    ASSESSMENT: Thrombocytopenia.  PLAN:    Thrombocytopenia: Likely secondary to heavy alcohol use, although patient had normal platelet count in November 2023.  CT scan of the abdomen on November 01, 2022 did not report any splenomegaly.  B12 levels and iron panel are within normal limits, folate is only mildly decreased.  Antiplatelet antibodies are pending at time of dictation.  Platelets continue to slowly improve and are now 84.  No intervention is needed.  Patient does not require bone marrow biopsy. Heavy alcohol use,  suicidal ideation: Psychiatry consulted and does not believe patient requires inpatient treatment. Anticoagulation: Okay to reinitiate Eliquis if platelets remain persistently greater than 50. Elevated ferritin: Likely secondary as an acute phase reactant. Macrocytosis: Likely secondary to alcohol use.  B12 levels are normal, folate is mildly decreased. Anemia: Chronic and unchanged.  Iron stores are within normal limits.  Will follow from a distance, call with questions.  Lloyd Huger, MD   01/19/2023 3:37 PM

## 2023-01-20 DIAGNOSIS — J9621 Acute and chronic respiratory failure with hypoxia: Secondary | ICD-10-CM | POA: Diagnosis not present

## 2023-01-20 DIAGNOSIS — N179 Acute kidney failure, unspecified: Secondary | ICD-10-CM | POA: Diagnosis not present

## 2023-01-20 DIAGNOSIS — R569 Unspecified convulsions: Secondary | ICD-10-CM | POA: Diagnosis not present

## 2023-01-20 DIAGNOSIS — A419 Sepsis, unspecified organism: Secondary | ICD-10-CM | POA: Diagnosis not present

## 2023-01-20 LAB — CBC
HCT: 30 % — ABNORMAL LOW (ref 39.0–52.0)
Hemoglobin: 9.2 g/dL — ABNORMAL LOW (ref 13.0–17.0)
MCH: 33.1 pg (ref 26.0–34.0)
MCHC: 30.7 g/dL (ref 30.0–36.0)
MCV: 107.9 fL — ABNORMAL HIGH (ref 80.0–100.0)
Platelets: 100 10*3/uL — ABNORMAL LOW (ref 150–400)
RBC: 2.78 MIL/uL — ABNORMAL LOW (ref 4.22–5.81)
RDW: 16.9 % — ABNORMAL HIGH (ref 11.5–15.5)
WBC: 3.5 10*3/uL — ABNORMAL LOW (ref 4.0–10.5)
nRBC: 0 % (ref 0.0–0.2)

## 2023-01-20 LAB — BASIC METABOLIC PANEL
Anion gap: 8 (ref 5–15)
BUN: 24 mg/dL — ABNORMAL HIGH (ref 6–20)
CO2: 31 mmol/L (ref 22–32)
Calcium: 8.6 mg/dL — ABNORMAL LOW (ref 8.9–10.3)
Chloride: 100 mmol/L (ref 98–111)
Creatinine, Ser: 1.1 mg/dL (ref 0.61–1.24)
GFR, Estimated: >60 mL/min (ref >60)
Glucose, Bld: 92 mg/dL (ref 70–99)
Potassium: 3.7 mmol/L (ref 3.5–5.1)
Sodium: 139 mmol/L (ref 135–145)

## 2023-01-20 LAB — CULTURE, BLOOD (ROUTINE X 2)
Culture: NO GROWTH
Culture: NO GROWTH
Special Requests: ADEQUATE
Special Requests: ADEQUATE

## 2023-01-20 LAB — GLUCOSE, CAPILLARY: Glucose-Capillary: 85 mg/dL (ref 70–99)

## 2023-01-20 MED ORDER — VENLAFAXINE HCL ER 75 MG PO CP24
75.0000 mg | ORAL_CAPSULE | Freq: Every day | ORAL | Status: DC
  Administered 2023-01-21 – 2023-01-24 (×4): 75 mg via ORAL
  Filled 2023-01-20 (×4): qty 1

## 2023-01-20 NOTE — Assessment & Plan Note (Signed)
Patient exhibits some suicidal thoughts overnight so he was IVC'ed. Denies any suicidal thoughts by following morning.  Seen by psychiatry and cleared.  Had not been taking his home Seroquel or Effexor.  Since hospitalization, patient has been more anxious.  Restarted Effexor and Seroquel.  As needed Xanax.

## 2023-01-20 NOTE — Progress Notes (Addendum)
OT Cancellation Note  Patient Details Name: Andrew Sparks MRN: [id number] DOB: [birth year]   Cancelled Treatment:    Reason Eval/Treat Not Completed: Patient declined, no reason specified. First checked on pt at 1107 this date. Upon entering room, pt asleep in bed and required Max multimodal cues for alertness/arousal. Attempting to have pt change soiled gown, however, pt reporting his girlfriend is bringing him clothes today. Pt adamantly declining further attempts to encourage EOB ADLs or transferring to recliner. Will re-attempt as able.  Checked on pt for 2nd time today at 1555. Pt asleep while sitting up in recliner. Pt reported being comfortable and deferred getting back to bed. Pt also deferring any form of ADLs reporting that he already completed them today. Will re-attempt at later date/time.  Lanelle Bal  [facility] 01/20/2023, 11:15 AM

## 2023-01-20 NOTE — Progress Notes (Signed)
Progress Note   Patient: Andrew Sparks [id number] DOB: [birth year] DOA: 01/14/2023     6 DOS: the patient was seen and examined on 01/20/2023   Brief hospital course:  Ezekyel Lei is a 57 y.o. male with medical history significant of paroxysmal atrial fibrillation, chronic HFpEF, chronic respiratory failure on 2 to 3 L at home hypertension, alcohol abuse, seizure disorder and depression was brought to ED via EMS when he fell out of chair and had reported seizure-like activity.  Patient states that he had stopped taking most of his home medications including his Keppra due to lack of money.   In the emergency room, patient noted to be hypoxic requiring 6 L of oxygen, atrial fibrillation with rapid ventricular rate and lab work noteworthy for alcohol level at 419 and lactic acid level 3.4.  Chest x-ray noted multilobar pneumonia with concerns for aspiration.  Patient started on IV Unasyn and Keppra.  Also noted to have thrombocytopenia.  Patient admitted to hospitalist service.  Hematology consulted for low platelets.  Patient also voiced some suicidal thoughts without plan and psychiatry consulted.  Over next few days, patient's symptoms improved.  Oxygenation improved down to baseline.  PE ruled out.  Hematology recommended restarting anticoagulation only after platelet count above 50, which was by 2/15.  Patient seen by physical therapy who recommended skilled nursing.  Psychiatry felt patient not a danger to himself and not a suicide risk.  Assessment and Plan: * Acute on chronic respiratory failure with hypoxia (HCC)-resolved as of 01/18/2023 Able to wean back to baseline oxygen but remained tachypneic. Secondary to aspiration pneumonia.  Uses 3 to 4 L at baseline,.  He was hypoxic up to 86% on baseline in the ED. elevated D-dimer and lower extremity venous Doppler was negative for DVT.  CTA was mostly inconclusive.  Initially unable to get VQ scan, but since then, patient's  oxygenation has much improved and he is not tachycardic and probability of PE much lower.   Sepsis with acute hypoxic respiratory failure without septic shock [facility]) Patient met severe sepsis criteria with tachycardia, tachypnea, imaging concerning for pneumonia.  Elevated lactic acid at 3.4>>2.9>>1.7 and AKI.  Respiratory panel negative for COVID, influenza and RSV.  Patient was started on Unasyn for concern of aspiration most likely secondary to alcohol intoxication. 1 set of blood cultures with MRSE-most likely contaminant but vancomycin was also added.  Repeat blood cultures remain negative so vancomycin was discontinued. -Continue Unasyn and Zithromax-complete a 5-day course -Follow-up blood cultures (1 blood culture with Staph epidermidis, likely contaminant) -Follow strep pneumo and Legionella antigen. -Follow-up sputum cultures   Seizure-like activity (HCC) Concern of seizure at home by girlfriend, no incontinence or tongue bite.  No significant postictal state.  Patient has an history of seizure disorder and stopped taking his Keppra for about a month.  No seizure-like activity noted in ED.  Initially received loading dose and then continued on home dose of Keppra.  Thrombocytopenia ([place]) Patient with acute onset thrombocytopenia with platelet at 54>>44>>45.  No obvious bleeding.  LDH mildly elevated.  Smear was negative for any abnormal morphology. Platelets normal 2-monthago.RUQ [place] for hepatics cirrhosis, can be due to alcohol intoxication. Checking some more labs to find out the cause of acute thrombocytopenia-pending results except negative HIV and hepatitis C.  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  Hematology was consulted.  Platelet count staying now above 50, Eliquis restarted as of 2/15, and platelet count  still steadily improving.  Paroxysmal atrial fibrillation (HCC) EKG with A-fib and RVR.  Patient was not very compliant with his home  amiodarone and metoprolol.  Heart rate with some improvement after increasing the dose of metoprolol -Continue with increased the dose of metoprolol at 100 mg twice daily -Continue amiodarone -Initially held home Eliquis due to thrombocytopenia-restarted on 123456  Chronic diastolic CHF (congestive heart failure) ([place]) Echo done in November 2023 with normal EF and grade 1 diastolic dysfunction. BNP of 227.  1+ lower extremity edema.  Patient received 1 dose of IV Lasix -Restart home Lasix at 40 mg daily p.o. -Avoid IV fluid. -Continue to monitor  Alcohol abuse Markedly elevated ethanol levels on admission.  Patient uses liquor regularly. Counseling was provided. -CIWA protocol with Ativan  Essential hypertension Patient was on multiple antihypertensives at home which he was not taking it for the past couple of weeks.  Blood pressure now within goal after restarting home amlodipine. -Continue home amlodipine -We can restart ACE inhibitor if needed as AKI has been resolved. -Metoprolol dose was increased and restarted home Lasix  Major depressive disorder, recurrent severe without psychotic features [facility]) Patient exhibits some suicidal thoughts overnight so he was IVC'ed. Denies any suicidal thoughts by following morning.  Seen by psychiatry and cleared.  Had not been taking his home Seroquel or Effexor.  Since hospitalization, patient has been more anxious.  Restarted Effexor and Seroquel.  As needed Xanax.  AKI (acute kidney injury) (HCC)-resolved as of 01/18/2023 Resolved with hydration Most likely secondary to sepsis.   -Monitor renal function -Avoid nephrotoxin  Tobacco abuse Counseling was provided. -Nicotine patch  Dyslipidemia -Continue statin  Obesity (BMI 30-39.9) Meets criteria BMI greater than 30   Subjective: Patient feels a little weak, otherwise no complaints  Physical Exam: Vitals:   01/20/23 0506 01/20/23 0627 01/20/23 0736 01/20/23 1145  BP: (!) 120/98   (!) 127/90 (!) 114/90  Pulse: 86  74 68  Resp: 16  16 16  $ Temp: 98.5 F (36.9 C)  97.9 F (36.6 C) 98.7 F (37.1 C)  TempSrc: Oral     SpO2: 98% 98% 92% 99%  Weight:      Height:       General.  Somnolent Pulmonary.  Clear to auscultation bilaterally CV.  Regular rate and rhythm, no JVD, rub or murmur. Abdomen.  Soft, nontender, nondistended, positive bowel sounds CNS.  Lethargic.  No focal neurologic deficit. Extremities.  No clubbing or cyanosis, trace pitting edema Psychiatry.  Appears to have some cognitive impairment  Data Reviewed: Creatinine 1.1.  Hemoglobin down to 9.2.  Platelet count at 100  Family Communication: Significant other at the bedside.  Disposition: Status is: Inpatient Remains inpatient appropriate because: Awaiting bed availability  Planned Discharge Destination: Skilled nursing  DVT prophylaxis.  Eliquis   Author: Annita Brod, MD 01/20/2023 2:53 PM  For on call review [email or web address].

## 2023-01-20 NOTE — Progress Notes (Signed)
On Oxygen but had it out at the time of vitals.

## 2023-01-20 NOTE — TOC Progression Note (Signed)
Transition of Care [facility]) - Progression Note    Patient Details  Name: Andrew Sparks MRN: [id number] Date of Birth: June 01, 1966  Transition of Care [facility]) CM/SW Contact  Izola Price, RN Phone Number: 01/20/2023, 1:16 PM  Clinical Narrative:  2/17: Bed offers remaining pending. Simmie Davies RN CM      Expected Discharge Plan: [place] Barriers to Discharge: Continued Medical Work up  Expected Discharge Plan and Services   Discharge Planning Services: CM Consult Post Acute Care Choice: [place] arrangements for the past 2 months: Single Family Home                 DME Arranged: N/A DME Agency: NA       HH Arranged: RN, PT, OT HH Agency: [place] Date [place]: 01/16/23 Time [place]: 1127 Representative spoke with at [place]: [place]   Social Determinants of Health ([place]) Interventions SDOH Screenings   Food Insecurity: No Food Insecurity (01/18/2023)  Housing: Low Risk  (01/18/2023)  Transportation Needs: No Transportation Needs (01/18/2023)  Utilities: Not At Risk (01/18/2023)  Alcohol Screen: High Risk (06/30/2022)  Tobacco Use: High Risk (01/14/2023)    Readmission Risk Interventions     No data to display

## 2023-01-21 LAB — CBC
HCT: 29.6 % — ABNORMAL LOW (ref 39.0–52.0)
Hemoglobin: 9.3 g/dL — ABNORMAL LOW (ref 13.0–17.0)
MCH: 33.7 pg (ref 26.0–34.0)
MCHC: 31.4 g/dL (ref 30.0–36.0)
MCV: 107.2 fL — ABNORMAL HIGH (ref 80.0–100.0)
Platelets: 140 10*3/uL — ABNORMAL LOW (ref 150–400)
RBC: 2.76 MIL/uL — ABNORMAL LOW (ref 4.22–5.81)
RDW: 16.9 % — ABNORMAL HIGH (ref 11.5–15.5)
WBC: 3.5 10*3/uL — ABNORMAL LOW (ref 4.0–10.5)
nRBC: 0 % (ref 0.0–0.2)

## 2023-01-21 LAB — BASIC METABOLIC PANEL
Anion gap: 5 (ref 5–15)
BUN: 21 mg/dL — ABNORMAL HIGH (ref 6–20)
CO2: 34 mmol/L — ABNORMAL HIGH (ref 22–32)
Calcium: 8.3 mg/dL — ABNORMAL LOW (ref 8.9–10.3)
Chloride: 98 mmol/L (ref 98–111)
Creatinine, Ser: 1.07 mg/dL (ref 0.61–1.24)
GFR, Estimated: >60 mL/min (ref >60)
Glucose, Bld: 86 mg/dL (ref 70–99)
Potassium: 3.7 mmol/L (ref 3.5–5.1)
Sodium: 137 mmol/L (ref 135–145)

## 2023-01-21 LAB — RESP PANEL BY RT-PCR (RSV, FLU A&B, COVID)  RVPGX2
Influenza A by PCR: NEGATIVE
Influenza B by PCR: NEGATIVE
Resp Syncytial Virus by PCR: NEGATIVE
SARS Coronavirus 2 by RT PCR: NEGATIVE

## 2023-01-21 LAB — GLUCOSE, CAPILLARY
Glucose-Capillary: 93 mg/dL (ref 70–99)
Glucose-Capillary: 108 mg/dL — ABNORMAL HIGH (ref 70–99)
Glucose-Capillary: 102 mg/dL — ABNORMAL HIGH (ref 70–99)

## 2023-01-21 NOTE — Progress Notes (Incomplete)
       CROSS COVER NOTE  NAME: Andrew Sparks MRN: [id number] DOB : 1966-04-10 ATTENDING PHYSICIAN: Andrew Brod, MD    Date of Service   01/21/2023   HPI/Events of Note   Partner diagnosed with COVID, has been to visit Patient asymptomatic Increasing anxiety  Interventions   Assessment/Plan: Resp panel X X    *** professional thanks      To reach the provider On-Call:   7AM- 7PM see care teams to locate the attending and reach out to them via [email or web address] Password: [email or web address] 7PM-7AM contact night-coverage If you still have difficulty reaching the appropriate provider, please page the [facility] (Director on Call) for [facility] on amion for assistance  This document was prepared using Systems analyst and may include unintentional dictation errors.  Andrew Glass DNP, MBA, FNP-BC, PMHNP-BC Nurse Practitioner [facility] Pager [phone]

## 2023-01-21 NOTE — Progress Notes (Signed)
Progress Note   Patient: Andrew Sparks [id number] DOB: 02-10-1966 DOA: 01/14/2023     7 DOS: the patient was seen and examined on 01/21/2023   Brief hospital course:  Dmichael Wesler is a 57 y.o. male with medical history significant of paroxysmal atrial fibrillation, chronic HFpEF, chronic respiratory failure on 2 to 3 L at home hypertension, alcohol abuse, seizure disorder and depression was brought to ED via EMS when he fell out of chair and had reported seizure-like activity.  Patient states that he had stopped taking most of his home medications including his Keppra due to lack of money.   In the emergency room, patient noted to be hypoxic requiring 6 L of oxygen, atrial fibrillation with rapid ventricular rate and lab work noteworthy for alcohol level at 419 and lactic acid level 3.4.  Chest x-ray noted multilobar pneumonia with concerns for aspiration.  Patient started on IV Unasyn and Keppra.  Also noted to have thrombocytopenia.  Patient admitted to hospitalist service.  Hematology consulted for low platelets.  Patient also voiced some suicidal thoughts without plan and psychiatry consulted.  Over next few days, patient's symptoms improved.  Oxygenation improved down to baseline.  PE ruled out.  Hematology recommended restarting anticoagulation only after platelet count above 50, which was by 2/15.  Patient seen by physical therapy who recommended skilled nursing.  Psychiatry felt patient not a danger to himself and not a suicide risk.  Assessment and Plan: * Acute on chronic respiratory failure with hypoxia (HCC)-resolved as of 01/18/2023 Able to wean back to baseline oxygen but remained tachypneic. Secondary to aspiration pneumonia.  Uses 3 to 4 L at baseline,.  He was hypoxic up to 86% on baseline in the ED. elevated D-dimer and lower extremity venous Doppler was negative for DVT.  CTA was mostly inconclusive.  Initially unable to get VQ scan, but since then, patient's  oxygenation has much improved and he is not tachycardic and probability of PE much lower.   Sepsis with acute hypoxic respiratory failure without septic shock [facility]) Patient met severe sepsis criteria with tachycardia, tachypnea, imaging concerning for pneumonia.  Elevated lactic acid at 3.4>>2.9>>1.7 and AKI.  Respiratory panel negative for COVID, influenza and RSV.  Patient was started on Unasyn for concern of aspiration most likely secondary to alcohol intoxication. 1 set of blood cultures with MRSE-most likely contaminant but vancomycin was also added.  Repeat blood cultures remain negative so vancomycin was discontinued. -Continue Unasyn and Zithromax-complete a 5-day course -Follow-up blood cultures (1 blood culture with Staph epidermidis, likely contaminant) -Follow strep pneumo and Legionella antigen. -Follow-up sputum cultures   Seizure-like activity (HCC) Concern of seizure at home by girlfriend, no incontinence or tongue bite.  No significant postictal state.  Patient has an history of seizure disorder and stopped taking his Keppra for about a month.  No seizure-like activity noted in ED.  Initially received loading dose and then continued on home dose of Keppra.  Thrombocytopenia ([place]) Patient with acute onset thrombocytopenia with platelet at 54>>44>>45.  No obvious bleeding.  LDH mildly elevated.  Smear was negative for any abnormal morphology. Platelets normal 39-monthago.RUQ UK[place] for hepatics cirrhosis, can be due to alcohol intoxication. Checking some more labs to find out the cause of acute thrombocytopenia-pending results except negative HIV and hepatitis C.  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  Hematology was consulted.  Platelet count staying now above 50, Eliquis restarted as of 2/15, and platelet count  still steadily improving.  Paroxysmal atrial fibrillation (HCC) EKG with A-fib and RVR.  Patient was not very compliant with his home  amiodarone and metoprolol.  Heart rate with some improvement after increasing the dose of metoprolol -Continue with increased the dose of metoprolol at 100 mg twice daily -Continue amiodarone -Initially held home Eliquis due to thrombocytopenia-restarted on 123456  Chronic diastolic CHF (congestive heart failure) ([place]) Echo done in November 2023 with normal EF and grade 1 diastolic dysfunction. BNP of 227.  1+ lower extremity edema.  Patient received 1 dose of IV Lasix -Restart home Lasix at 40 mg daily p.o. -Avoid IV fluid. -Continue to monitor  Alcohol abuse Markedly elevated ethanol levels on admission.  Patient uses liquor regularly. Counseling was provided. -CIWA protocol with Ativan  Essential hypertension Patient was on multiple antihypertensives at home which he was not taking it for the past couple of weeks.  Blood pressure now within goal after restarting home amlodipine. -Continue home amlodipine -We can restart ACE inhibitor if needed as AKI has been resolved. -Metoprolol dose was increased and restarted home Lasix  Major depressive disorder, recurrent severe without psychotic features [facility]) Patient exhibits some suicidal thoughts overnight so he was IVC'ed. Denies any suicidal thoughts by following morning.  Seen by psychiatry and cleared.  Had not been taking his home Seroquel or Effexor.  Since hospitalization, patient has been more anxious.  Restarted Effexor and Seroquel.  As needed Xanax.  AKI (acute kidney injury) (HCC)-resolved as of 01/18/2023 Resolved with hydration Most likely secondary to sepsis.   -Monitor renal function -Avoid nephrotoxin  Tobacco abuse Counseling was provided. -Nicotine patch  Dyslipidemia -Continue statin  Obesity (BMI 30-39.9) Meets criteria BMI greater than 30   Subjective: Patient with no complaints  Physical Exam: Vitals:   01/21/23 0457 01/21/23 0500 01/21/23 0740 01/21/23 1124  BP: (!) 121/94  (!) 132/108 114/88  Pulse:  85  78 66  Resp: 18  18 18  $ Temp: 97.8 F (36.6 C)  98.6 F (37 C) 98.7 F (37.1 C)  TempSrc:   Oral Oral  SpO2: 93%  100% 100%  Weight:  115.9 kg    Height:       General.  Oriented x 2, no acute distress Pulmonary.  Clear to auscultation bilaterally CV.  Regular rate and rhythm, no JVD, rub or murmur. Abdomen.  Soft, nontender, nondistended, positive bowel sounds CNS.  Lethargic.  No focal neurologic deficit. Extremities.  No clubbing or cyanosis, trace pitting edema Psychiatry.  Appears to have some baseline cognitive impairment  Data Reviewed: Creatinine 1.07.  White blood cell count 3.5, hemoglobin stable  Family Communication: Will call significant other  Disposition: Status is: Inpatient Remains inpatient appropriate because: Awaiting bed availability  Planned Discharge Destination: Skilled nursing  DVT prophylaxis.  Eliquis   Author: Annita Brod, MD 01/21/2023 2:24 PM  For on call review [email or web address].

## 2023-01-22 LAB — BASIC METABOLIC PANEL
Anion gap: 8 (ref 5–15)
BUN: 21 mg/dL — ABNORMAL HIGH (ref 6–20)
CO2: 35 mmol/L — ABNORMAL HIGH (ref 22–32)
Calcium: 8.7 mg/dL — ABNORMAL LOW (ref 8.9–10.3)
Chloride: 97 mmol/L — ABNORMAL LOW (ref 98–111)
Creatinine, Ser: 1.03 mg/dL (ref 0.61–1.24)
GFR, Estimated: >60 mL/min (ref >60)
Glucose, Bld: 80 mg/dL (ref 70–99)
Potassium: 3.6 mmol/L (ref 3.5–5.1)
Sodium: 140 mmol/L (ref 135–145)

## 2023-01-22 LAB — CBC
HCT: 30.2 % — ABNORMAL LOW (ref 39.0–52.0)
Hemoglobin: 9.5 g/dL — ABNORMAL LOW (ref 13.0–17.0)
MCH: 34.1 pg — ABNORMAL HIGH (ref 26.0–34.0)
MCHC: 31.5 g/dL (ref 30.0–36.0)
MCV: 108.2 fL — ABNORMAL HIGH (ref 80.0–100.0)
Platelets: 154 10*3/uL (ref 150–400)
RBC: 2.79 MIL/uL — ABNORMAL LOW (ref 4.22–5.81)
RDW: 16.9 % — ABNORMAL HIGH (ref 11.5–15.5)
WBC: 3.6 10*3/uL — ABNORMAL LOW (ref 4.0–10.5)
nRBC: 0 % (ref 0.0–0.2)

## 2023-01-22 LAB — GLUCOSE, CAPILLARY: Glucose-Capillary: 90 mg/dL (ref 70–99)

## 2023-01-22 MED ORDER — ROSUVASTATIN CALCIUM 20 MG PO TABS
20.0000 mg | ORAL_TABLET | Freq: Every day | ORAL | Status: DC
  Administered 2023-01-22 – 2023-01-24 (×3): 20 mg via ORAL
  Filled 2023-01-22 (×3): qty 1

## 2023-01-22 MED ORDER — AMLODIPINE BESYLATE 5 MG PO TABS
2.5000 mg | ORAL_TABLET | Freq: Every day | ORAL | Status: DC
  Administered 2023-01-23 – 2023-01-24 (×2): 2.5 mg via ORAL
  Filled 2023-01-22 (×2): qty 1

## 2023-01-22 MED ORDER — LISINOPRIL 20 MG PO TABS
40.0000 mg | ORAL_TABLET | Freq: Every day | ORAL | Status: DC
  Administered 2023-01-23 – 2023-01-24 (×2): 40 mg via ORAL
  Filled 2023-01-22 (×2): qty 2

## 2023-01-22 NOTE — Progress Notes (Signed)
Progress Note   Patient: Andrew Sparks [id number] DOB: 1966/04/29 DOA: 01/14/2023     8 DOS: the patient was seen and examined on 01/22/2023   Brief hospital course:  Andrew Sparks is a 57 y.o. male with medical history significant of paroxysmal atrial fibrillation, chronic HFpEF, chronic respiratory failure on 2 to 3 L at home hypertension, alcohol abuse, seizure disorder and depression was brought to ED via EMS when he fell out of chair and had reported seizure-like activity.  Patient states that he had stopped taking most of his home medications including his Keppra due to lack of money.   In the emergency room, patient noted to be hypoxic requiring 6 L of oxygen, atrial fibrillation with rapid ventricular rate and lab work noteworthy for alcohol level at 419 and lactic acid level 3.4.  Chest x-ray noted multilobar pneumonia with concerns for aspiration.  Patient started on IV Unasyn and Keppra.  Also noted to have thrombocytopenia.  Patient admitted to hospitalist service.  Hematology consulted for low platelets.  Patient also voiced some suicidal thoughts without plan and psychiatry consulted.  Over next few days, patient's symptoms improved.  Oxygenation improved down to baseline.  PE ruled out.  Hematology recommended restarting anticoagulation only after platelet count above 50, which was by 2/15.  Patient seen by physical therapy who recommended skilled nursing.  Psychiatry felt patient not a danger to himself and not a suicide risk.  Assessment and Plan: * Acute on chronic respiratory failure with hypoxia (HCC)-resolved as of 01/18/2023 Able to wean back to baseline oxygen but remained tachypneic. Secondary to aspiration pneumonia.  Uses 3 to 4 L at baseline,.  He was hypoxic up to 86% on baseline in the ED. elevated D-dimer and lower extremity venous Doppler was negative for DVT.  CTA was mostly inconclusive.  Initially unable to get VQ scan, but since then, patient's  oxygenation has much improved and he is not tachycardic and probability of PE much lower.   Sepsis with acute hypoxic respiratory failure without septic shock [facility]) Patient met severe sepsis criteria with tachycardia, tachypnea, imaging concerning for pneumonia.  Elevated lactic acid at 3.4>>2.9>>1.7 and AKI.  Respiratory panel negative for COVID, influenza and RSV.  Patient was started on Unasyn for concern of aspiration most likely secondary to alcohol intoxication. 1 set of blood cultures with MRSE-most likely contaminant but vancomycin was also added.  Repeat blood cultures remain negative so vancomycin was discontinued. -Continue Unasyn and Zithromax-complete a 5-day course -Follow-up blood cultures (1 blood culture with Staph epidermidis, likely contaminant) -Follow strep pneumo and Legionella antigen. -Follow-up sputum cultures   Seizure-like activity (HCC) Concern of seizure at home by girlfriend, no incontinence or tongue bite.  No significant postictal state.  Patient has an history of seizure disorder and stopped taking his Keppra for about a month.  No seizure-like activity noted in ED.  Initially received loading dose and then continued on home dose of Keppra.  Thrombocytopenia ([place]) Patient with acute onset thrombocytopenia with platelet at 54>>44>>45.  No obvious bleeding.  LDH mildly elevated.  Smear was negative for any abnormal morphology. Platelets normal 53-monthago.RUQ UKoreanegative for hepatics cirrhosis, can be due to alcohol intoxication. Checking some more labs to find out the cause of acute thrombocytopenia-pending results except negative HIV and hepatitis C.  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  Hematology was consulted.  Platelet count staying now above 50, Eliquis restarted as of 2/15, and platelet count  still steadily improving.  Paroxysmal atrial fibrillation (HCC) EKG with A-fib and RVR.  Patient was not very compliant with his home  amiodarone and metoprolol.  Heart rate with some improvement after increasing the dose of metoprolol -Continue with increased the dose of metoprolol at 100 mg twice daily -Continue amiodarone -Initially held home Eliquis due to thrombocytopenia-restarted on 123456  Chronic diastolic CHF (congestive heart failure) ([place]) Echo done in November 2023 with normal EF and grade 1 diastolic dysfunction. BNP of 227.  1+ lower extremity edema.  Patient received 1 dose of IV Lasix -Restart home Lasix at 40 mg daily p.o. -Avoid IV fluid. -Continue to monitor  Alcohol abuse Markedly elevated ethanol levels on admission.  Patient uses liquor regularly. Counseling was provided. -CIWA protocol with Ativan  Essential hypertension Patient was on multiple antihypertensives at home which he was not taking it for the past couple of weeks.  Blood pressure now within goal after restarting home amlodipine. -Continue home amlodipine -We can restart ACE inhibitor if needed as AKI has been resolved. -Metoprolol dose was increased and restarted home Lasix  Major depressive disorder, recurrent severe without psychotic features [facility]) Patient exhibits some suicidal thoughts overnight so he was IVC'ed. Denies any suicidal thoughts by following morning.  Seen by psychiatry and cleared.  Had not been taking his home Seroquel or Effexor.  Since hospitalization, patient has been more anxious.  Restarted Effexor and Seroquel.  As needed Xanax.  AKI (acute kidney injury) (HCC)-resolved as of 01/18/2023 Resolved with hydration Most likely secondary to sepsis.   -Monitor renal function -Avoid nephrotoxin  Tobacco abuse Counseling was provided. -Nicotine patch  Dyslipidemia -Continue statin  Obesity (BMI 30-39.9) Meets criteria BMI greater than 30   Subjective: Patient with no complaints  Physical Exam: Vitals:   01/21/23 2207 01/22/23 0559 01/22/23 0735 01/22/23 0823  BP: (!) 129/97 111/88  118/88  Pulse: 82  62  72  Resp: 16 20  17  $ Temp: 98.6 F (37 C) 97.9 F (36.6 C)  97.8 F (36.6 C)  TempSrc: Oral Oral    SpO2: 100% 98% 97% 98%  Weight:      Height:       General.  Oriented x 2, no acute distress Pulmonary.  Clear to auscultation bilaterally CV.  Regular rate and rhythm, no JVD, rub or murmur. Abdomen.  Soft, nontender, nondistended, positive bowel sounds CNS.  Lethargic.  No focal neurologic deficit. Extremities.  No clubbing or cyanosis, trace pitting edema Psychiatry.  Appears to have some baseline cognitive impairment  Data Reviewed: Creatinine 1.07.  White blood cell count 3.5, hemoglobin stable  Family Communication: Patient's significant other reports that she was diagnosed with COVID.  She has visited him few times since then being in the hospital patient was concerned about this.  COVID test checked on 2/18 which was negative.  Disposition: Status is: Inpatient Remains inpatient appropriate because: Awaiting bed availability  Planned Discharge Destination: Skilled nursing  DVT prophylaxis.  Eliquis   Author: Annita Brod, MD 01/22/2023 1:34 PM  For on call review [email or web address].

## 2023-01-22 NOTE — Progress Notes (Signed)
Occupational Therapy Treatment Patient Details Name: Andrew Sparks MRN: [id number] DOB: February 14, 1966 Today's Date: 01/22/2023   History of present illness Patient is a 57 year old male with concern for seizure at home. Acute on chronic respiratory failure with hypoxia, sepsis due to pneumonia. Medical history significant of paroxysmal atrial fibrillation, chronic HFpEF, hypertension, alcohol abuse, seizure disorder and depression.   OT comments  Upon entering the room, pt seated in recliner chair and asking for assistance to ambulate to bathroom for urgency to have BM. Pt stands from recliner chair with min guard and ambulates with RW and supervision to bathroom. Pt able to have successful BM and performs hygiene while seated. Pt stands and washes hands at sink for close supervision and then washes face also. Pt then ambulates back to recliner chair in same manner as above. All needs within reach. Min cuing for safety awareness during session. Pt making excellent progress towards goals.   Recommendations for follow up therapy are one component of a multi-disciplinary discharge planning process, led by the attending physician.  Recommendations may be updated based on patient status, additional functional criteria and insurance authorization.    Follow Up Recommendations  Skilled nursing-short term rehab (<3 hours/day)     Assistance Recommended at Discharge Intermittent Supervision/Assistance  Patient can return home with the following  Help with stairs or ramp for entrance;Assist for transportation;Assistance with cooking/housework;A lot of help with walking and/or transfers;A lot of help with bathing/dressing/bathroom   Equipment Recommendations  None recommended by OT       Precautions / Restrictions Precautions Precautions: Fall Restrictions Weight Bearing Restrictions: No       Mobility Bed Mobility               General bed mobility comments: seated in recliner  chair    Transfers Overall transfer level: Needs assistance Equipment used: Rolling walker (2 wheels) Transfers: Sit to/from Stand Sit to Stand: Min guard, Supervision                 Balance Overall balance assessment: Needs assistance Sitting-balance support: Feet supported Sitting balance-Leahy Scale: Fair     Standing balance support: Bilateral upper extremity supported, During functional activity Standing balance-Leahy Scale: Fair                             ADL either performed or assessed with clinical judgement   ADL Overall ADL's : Needs assistance/impaired     Grooming: Wash/dry hands;Wash/dry face;Supervision/safety;Standing                   Toilet Transfer: Supervision/safety;Rolling walker (2 wheels)   Toileting- Clothing Manipulation and Hygiene: Supervision/safety;Sit to/from stand       Functional mobility during ADLs: Supervision/safety      Extremity/Trunk Assessment Upper Extremity Assessment Upper Extremity Assessment: Generalized weakness   Lower Extremity Assessment Lower Extremity Assessment: Generalized weakness        Vision Patient Visual Report: No change from baseline            Cognition Arousal/Alertness: Awake/alert Behavior During Therapy: WFL for tasks assessed/performed Overall Cognitive Status: Within Functional Limits for tasks assessed                                                General Comments patient requesting  to walk without 02. Sp02 88-89% on room air after walking with no dyspnea noted. supplemental 02 back on at end of session    Pertinent Vitals/ Pain       Pain Assessment Pain Assessment: No/denies pain         Frequency  Min 2X/week        Progress Toward Goals  OT Goals(current goals can now be found in the care plan section)  Progress towards OT goals: Progressing toward goals  Acute Rehab OT Goals Patient Stated Goal: to go home OT Goal  Formulation: With patient Time For Goal Achievement: 01/29/23 Potential to Achieve Goals: Good  Plan Discharge plan needs to be updated;Frequency remains appropriate       AM-PAC OT "6 Clicks" Daily Activity     Outcome Measure   Help from another person eating meals?: None Help from another person taking care of personal grooming?: A Little Help from another person toileting, which includes using toliet, bedpan, or urinal?: A Little Help from another person bathing (including washing, rinsing, drying)?: A Little Help from another person to put on and taking off regular upper body clothing?: None Help from another person to put on and taking off regular lower body clothing?: A Little 6 Click Score: 20    End of Session Equipment Utilized During Treatment: Rolling walker (2 wheels)  OT Visit Diagnosis: Unsteadiness on feet (R26.81);Muscle weakness (generalized) (M62.81);History of falling (Z91.81)   Activity Tolerance Patient tolerated treatment well   Patient Left in chair;with chair alarm set;with call bell/phone within reach   Nurse Communication Mobility status        Time: [id number] OT Time Calculation (min): 16 min  Charges: OT General Charges $OT Visit: 1 Visit OT Treatments $Self Care/Home Management : 8-22 mins  Darleen Crocker, MS, OTR/L , CBIS ascom [phone]  01/22/23, 1:34 PM

## 2023-01-22 NOTE — Progress Notes (Signed)
Physical Therapy Treatment Patient Details Name: Andrew Sparks MRN: [id number] DOB: [birth year] Today's Date: 01/22/2023   History of Present Illness Patient is a 57 year old male with concern for seizure at home. Acute on chronic respiratory failure with hypoxia, sepsis due to pneumonia. Medical history significant of paroxysmal atrial fibrillation, chronic HFpEF, hypertension, alcohol abuse, seizure disorder and depression.    PT Comments    Patient is agreeable to PT. He was alert and able to follow all commands. The patient ambulated in hallway with assistance using rolling walker with activity tolerance limited by fatigue. He still required assistance for standing from recliner as well. Continue to recommend SNF for short term rehab at this time. PT will continue to follow to maximize independence and facilitate return to prior level of function.    Recommendations for follow up therapy are one component of a multi-disciplinary discharge planning process, led by the attending physician.  Recommendations may be updated based on patient status, additional functional criteria and insurance authorization.  Follow Up Recommendations  Skilled nursing-short term rehab (<3 hours/day) Can patient physically be transported by private vehicle: No   Assistance Recommended at Discharge Frequent or constant Supervision/Assistance  Patient can return home with the following A lot of help with walking and/or transfers;A lot of help with bathing/dressing/bathroom;Help with stairs or ramp for entrance;Assist for transportation;Assistance with cooking/housework;Direct supervision/assist for medications management   Equipment Recommendations  None recommended by PT    Recommendations for Other Services       Precautions / Restrictions Precautions Precautions: Fall Restrictions Weight Bearing Restrictions: No     Mobility  Bed Mobility               General bed mobility comments:  not assessed as patient sitting up on arrival and post session    Transfers Overall transfer level: Needs assistance Equipment used: Rolling walker (2 wheels) Transfers: Sit to/from Stand Sit to Stand: Min guard           General transfer comment: verbal cues for technique, hand placement    Ambulation/Gait Ambulation/Gait assistance: Min guard Gait Distance (Feet): 80 Feet Assistive device: Rolling walker (2 wheels) Gait Pattern/deviations: Step-through pattern, Trunk flexed Gait velocity: decreased     General Gait Details: verbal cues for standing closer to rolling walker for support   Stairs             Wheelchair Mobility    Modified Rankin (Stroke Patients Only)       Balance Overall balance assessment: Needs assistance Sitting-balance support: Feet supported Sitting balance-Leahy Scale: Fair     Standing balance support: Bilateral upper extremity supported, During functional activity Standing balance-Leahy Scale: Fair                              Cognition Arousal/Alertness: Awake/alert Behavior During Therapy: WFL for tasks assessed/performed Overall Cognitive Status: Within Functional Limits for tasks assessed                                          Exercises      General Comments General comments (skin integrity, edema, etc.): patient requesting to walk without 02. Sp02 88-89% on room air after walking with no dyspnea noted. supplemental 02 back on at end of session      Pertinent Vitals/Pain Pain Assessment Pain Assessment:  No/denies pain    Home Living                          Prior Function            PT Goals (current goals can now be found in the care plan section) Acute Rehab PT Goals Patient Stated Goal: to go home PT Goal Formulation: With patient Time For Goal Achievement: 01/29/23 Potential to Achieve Goals: Fair Progress towards PT goals: Progressing toward goals     Frequency    Min 2X/week      PT Plan Current plan remains appropriate    Co-evaluation              AM-PAC PT "6 Clicks" Mobility   Outcome Measure  Help needed turning from your back to your side while in a flat bed without using bedrails?: A Little Help needed moving from lying on your back to sitting on the side of a flat bed without using bedrails?: A Little Help needed moving to and from a bed to a chair (including a wheelchair)?: A Little Help needed standing up from a chair using your arms (e.g., wheelchair or bedside chair)?: A Little Help needed to walk in hospital room?: Total Help needed climbing 3-5 steps with a railing? : Total 6 Click Score: 14    End of Session Equipment Utilized During Treatment: Oxygen Activity Tolerance: Patient limited by fatigue Patient left: in chair;with call bell/phone within reach;with chair alarm set   PT Visit Diagnosis: Unsteadiness on feet (R26.81);Muscle weakness (generalized) (M62.81)     Time: 0940-1000 PT Time Calculation (min) (ACUTE ONLY): 20 min  Charges:  $Therapeutic Activity: 8-22 mins                    Andrew Sparks, PT, MPT    Andrew Sparks 01/22/2023, 12:57 PM

## 2023-01-23 DIAGNOSIS — A419 Sepsis, unspecified organism: Secondary | ICD-10-CM | POA: Diagnosis not present

## 2023-01-23 DIAGNOSIS — N179 Acute kidney failure, unspecified: Secondary | ICD-10-CM | POA: Diagnosis not present

## 2023-01-23 DIAGNOSIS — R569 Unspecified convulsions: Secondary | ICD-10-CM | POA: Diagnosis not present

## 2023-01-23 DIAGNOSIS — J9621 Acute and chronic respiratory failure with hypoxia: Secondary | ICD-10-CM | POA: Diagnosis not present

## 2023-01-23 LAB — CBC
HCT: 28.7 % — ABNORMAL LOW (ref 39.0–52.0)
Hemoglobin: 9.1 g/dL — ABNORMAL LOW (ref 13.0–17.0)
MCH: 34.6 pg — ABNORMAL HIGH (ref 26.0–34.0)
MCHC: 31.7 g/dL (ref 30.0–36.0)
MCV: 109.1 fL — ABNORMAL HIGH (ref 80.0–100.0)
Platelets: 155 10*3/uL (ref 150–400)
RBC: 2.63 MIL/uL — ABNORMAL LOW (ref 4.22–5.81)
RDW: 16.6 % — ABNORMAL HIGH (ref 11.5–15.5)
WBC: 2.7 10*3/uL — ABNORMAL LOW (ref 4.0–10.5)
nRBC: 0 % (ref 0.0–0.2)

## 2023-01-23 NOTE — Progress Notes (Signed)
Progress Note   Patient: Andrew Sparks [id number] DOB: 1966-01-21 DOA: 01/14/2023     9 DOS: the patient was seen and examined on 01/23/2023   Brief hospital course:  Traden Arnzen is a 57 y.o. male with medical history significant of paroxysmal atrial fibrillation, chronic HFpEF, chronic respiratory failure on 2 to 3 L at home hypertension, alcohol abuse, seizure disorder and depression was brought to ED via EMS when he fell out of chair and had reported seizure-like activity.  Patient states that he had stopped taking most of his home medications including his Keppra due to lack of money.   In the emergency room, patient noted to be hypoxic requiring 6 L of oxygen, atrial fibrillation with rapid ventricular rate and lab work noteworthy for alcohol level at 419 and lactic acid level 3.4.  Chest x-ray noted multilobar pneumonia with concerns for aspiration.  Patient started on IV Unasyn and Keppra.  Also noted to have thrombocytopenia.  Patient admitted to hospitalist service.  Hematology consulted for low platelets.  Patient also voiced some suicidal thoughts without plan and psychiatry consulted.  Over next few days, patient's symptoms improved.  Oxygenation improved down to baseline.  PE ruled out.  Hematology recommended restarting anticoagulation only after platelet count above 50, which was by 2/15.  Patient seen by physical therapy who recommended skilled nursing.  Psychiatry felt patient not a danger to himself and not a suicide risk.  Assessment and Plan: * Acute on chronic respiratory failure with hypoxia (HCC)-resolved as of 01/18/2023 Able to wean back to baseline oxygen but remained tachypneic. Secondary to aspiration pneumonia.  Uses 3 to 4 L at baseline,.  He was hypoxic up to 86% on baseline in the ED. elevated D-dimer and lower extremity venous Doppler was negative for DVT.  CTA was mostly inconclusive.  Initially unable to get VQ scan, but since then, patient's  oxygenation has much improved and he is not tachycardic and probability of PE much lower.   Sepsis with acute hypoxic respiratory failure without septic shock [facility]) Patient met severe sepsis criteria with tachycardia, tachypnea, imaging concerning for pneumonia.  Elevated lactic acid at 3.4>>2.9>>1.7 and AKI.  Respiratory panel negative for COVID, influenza and RSV.  Patient was started on Unasyn for concern of aspiration most likely secondary to alcohol intoxication. 1 set of blood cultures with MRSE-most likely contaminant but vancomycin was also added.  Repeat blood cultures remain negative so vancomycin was discontinued. -Continue Unasyn and Zithromax-completed a 5-day course All cultures to date have been negative except for a Parvovirus B19 IgG titer.   Seizure-like activity ([place]) Concern of seizure at home by girlfriend, no incontinence or tongue bite.  No significant postictal state.  Patient has an history of seizure disorder and stopped taking his Keppra for about a month.  No seizure-like activity noted in ED.  Initially received loading dose and then continued on home dose of Keppra.  Thrombocytopenia ([place]) Patient with acute onset thrombocytopenia with platelet at 54>>44>>45.  No obvious bleeding.  LDH mildly elevated.  Smear was negative for any abnormal morphology. Platelets normal 25-monthago.RUQ [place] for hepatics cirrhosis, can be due to alcohol intoxication. Checking some more labs to find out the cause of acute thrombocytopenia-pending results except negative HIV and hepatitis C.  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  Hematology was consulted.  Platelet count staying now above 50, Eliquis restarted as of 2/15, and platelet count still steadily improving.  Paroxysmal atrial  fibrillation ([place]) EKG with A-fib and RVR.  Patient was not very compliant with his home amiodarone and metoprolol.  Heart rate with some improvement after increasing the dose  of metoprolol -Continue with increased the dose of metoprolol at 100 mg twice daily -Continue amiodarone -Initially held home Eliquis due to thrombocytopenia-restarted on [id number]  Chronic diastolic CHF (congestive heart failure) ([place]) Echo done in November 2023 with normal EF and grade 1 diastolic dysfunction. BNP of 227.  1+ lower extremity edema.  Patient received 1 dose of IV Lasix -Restart home Lasix at 40 mg daily p.o. -Avoid IV fluid. -Continue to monitor  Alcohol abuse Markedly elevated ethanol levels on admission.  Patient uses liquor regularly. Counseling was provided. -CIWA protocol with Ativan  Essential hypertension Patient was on multiple antihypertensives at home which he was not taking it for the past couple of weeks.  Blood pressure now within goal after restarting home amlodipine. -Continue home amlodipine -We can restart ACE inhibitor if needed as AKI has been resolved. -Metoprolol dose was increased and restarted home Lasix  Major depressive disorder, recurrent severe without psychotic features [facility]) Patient exhibits some suicidal thoughts on first night of hospitalization so he was IVC'ed.  Denies any suicidal thoughts by following morning.  Seen by psychiatry and cleared.  Had not been taking his home Seroquel or Effexor.  Since hospitalization, patient has been more anxious.  Restarted Effexor and Seroquel.  As needed Xanax.  AKI (acute kidney injury) (HCC)-resolved as of 01/18/2023 Resolved with hydration Most likely secondary to sepsis.   -Monitor renal function -Avoid nephrotoxin  Tobacco abuse Counseling was provided. -Nicotine patch  Dyslipidemia -Continue statin  Obesity (BMI 30-39.9) Meets criteria BMI greater than 30   Subjective: Patient with no complaints now.  Physical Exam: Vitals:   01/22/23 2029 01/22/23 2038 01/23/23 0516 01/23/23 0804  BP: (!) 137/107  (!) 127/97 (!) 129/90  Pulse: 81  93 83  Resp: 18  18 (!) 21  Temp: 98.6 F (37  C)  98 F (36.7 C) 99.2 F (37.3 C)  TempSrc: Oral  Oral   SpO2: 100% 97% 99% 97%  Weight:      Height:       General.  Oriented x 2, no acute distress Pulmonary.  Clear to auscultation bilaterally CV.  Regular rate and rhythm, no JVD, rub or murmur. Abdomen.  Soft, nontender, nondistended, positive bowel sounds CNS.  Lethargic.  No focal neurologic deficit. Extremities.  No clubbing or cyanosis, trace pitting edema Psychiatry.  Appears to have some baseline cognitive impairment  Data Reviewed: No labs today  Family Communication: Patient's significant other reports that she was diagnosed with COVID on 2/18.  She has visited him few times since then being in the hospital patient was concerned about this.  COVID test checked on 2/18 which was negative.  Disposition: Status is: Inpatient Remains inpatient appropriate because: Awaiting bed availability, difficult due to patient's initial suicidal ideation  Planned Discharge Destination: Skilled nursing  DVT prophylaxis.  Eliquis   Author: Annita Brod, MD 01/23/2023 2:58 PM  For on call review [email or web address].

## 2023-01-24 DIAGNOSIS — R29898 Other symptoms and signs involving the musculoskeletal system: Secondary | ICD-10-CM | POA: Diagnosis not present

## 2023-01-24 DIAGNOSIS — Z7401 Bed confinement status: Secondary | ICD-10-CM | POA: Diagnosis not present

## 2023-01-24 DIAGNOSIS — Z743 Need for continuous supervision: Secondary | ICD-10-CM | POA: Diagnosis not present

## 2023-01-24 DIAGNOSIS — J9621 Acute and chronic respiratory failure with hypoxia: Secondary | ICD-10-CM | POA: Diagnosis not present

## 2023-01-24 LAB — GLUCOSE, CAPILLARY: Glucose-Capillary: 101 mg/dL — ABNORMAL HIGH (ref 70–99)

## 2023-01-24 MED ORDER — LEVETIRACETAM 500 MG PO TABS: 500.0000 mg | ORAL_TABLET | Freq: Two times a day (BID) | ORAL | 0 refills | Status: DC

## 2023-01-24 MED ORDER — BUPROPION HCL ER (XL) 150 MG PO TB24: 150.0000 mg | ORAL_TABLET | Freq: Every day | ORAL | 0 refills | Status: DC

## 2023-01-24 MED ORDER — AMLODIPINE BESYLATE 2.5 MG PO TABS: 2.5000 mg | ORAL_TABLET | Freq: Every day | ORAL | 0 refills | Status: AC

## 2023-01-24 MED ORDER — LISINOPRIL 40 MG PO TABS: 40.0000 mg | ORAL_TABLET | Freq: Every day | ORAL | 0 refills | Status: DC

## 2023-01-24 MED ORDER — FUROSEMIDE 40 MG PO TABS: 40.0000 mg | ORAL_TABLET | Freq: Every day | ORAL | 0 refills | Status: DC

## 2023-01-24 MED ORDER — METOPROLOL TARTRATE 50 MG PO TABS: 50.0000 mg | ORAL_TABLET | Freq: Two times a day (BID) | ORAL | 0 refills | Status: DC

## 2023-01-24 MED ORDER — QUETIAPINE FUMARATE 100 MG PO TABS: 100.0000 mg | ORAL_TABLET | Freq: Every day | ORAL | 0 refills | Status: DC

## 2023-01-24 MED ORDER — METOPROLOL TARTRATE 100 MG PO TABS: 100.0000 mg | ORAL_TABLET | Freq: Two times a day (BID) | ORAL | 0 refills | Status: DC

## 2023-01-24 MED ORDER — ROSUVASTATIN CALCIUM 20 MG PO TABS: 20.0000 mg | ORAL_TABLET | Freq: Every day | ORAL | 0 refills | Status: DC

## 2023-01-24 MED ORDER — VENLAFAXINE HCL ER 75 MG PO CP24: 75.0000 mg | ORAL_CAPSULE | Freq: Every day | ORAL | 0 refills | Status: DC

## 2023-01-24 MED ORDER — QUETIAPINE FUMARATE 50 MG PO TABS: 50.0000 mg | ORAL_TABLET | Freq: Every day | ORAL | 0 refills | Status: DC

## 2023-01-24 MED ORDER — APIXABAN 5 MG PO TABS: 5.0000 mg | ORAL_TABLET | Freq: Two times a day (BID) | ORAL | 0 refills | Status: AC

## 2023-01-24 MED ORDER — AMIODARONE HCL 200 MG PO TABS: 200.0000 mg | ORAL_TABLET | Freq: Every day | ORAL | 0 refills | Status: AC

## 2023-01-24 MED ORDER — GABAPENTIN 400 MG PO CAPS: 400.0000 mg | ORAL_CAPSULE | Freq: Three times a day (TID) | ORAL | 0 refills | Status: DC

## 2023-01-24 NOTE — Care Management Important Message (Signed)
Important Message  Patient Details  Name: Andrew Sparks MRN: [id number] Date of Birth: 1966/09/01   Medicare Important Message Given:  Yes     [name] 01/24/2023, 1:46 PM

## 2023-01-24 NOTE — TOC Transition Note (Addendum)
Transition of Care [facility]) - CM/SW Discharge Note   Patient Details  Name: Andrew Sparks MRN: [id number] Date of Birth: 05/21/1966  [facility]) CM/SW Contact:  Gerilyn Pilgrim, LCSW Phone Number: 01/24/2023, 1:50 PM   Clinical Narrative:   CSW spoke with pt regarding SNF. Pt reports he has changed his mind and would like to go home with [facility]. Pt reports no preference. [facility] arranged for PT/OT/RN/AID/SW SOC within 48 hours. Pt reports he would like to use [facility] to transport home.    Final next level of care: Home w Home Health Services Barriers to Discharge: Barriers Resolved   Patient Goals and CMS Choice CMS Medicare.gov Compare Post Acute Care list provided to:: Patient Choice offered to / list presented to : Patient  Discharge Placement                  Patient to be transferred to facility by: [facility]   Patient and family notified of of transfer: 01/24/23  Discharge Plan and Services Additional resources added to the After Visit Summary for     Discharge Planning Services: CM Consult Post Acute Care Choice: Home Health          DME Arranged: N/A DME Agency: NA       HH Arranged: PT, OT, RN, Nurse's Aide [place] Agency: [place] Date [facility] Agency Contacted: 01/24/23 Time HH Agency Contacted: [phone] Representative spoke with at [place]: corey  Social Determinants of Health ([place]) Interventions SDOH Screenings   Food Insecurity: No Food Insecurity (01/18/2023)  Housing: Low Risk  (01/18/2023)  Transportation Needs: No Transportation Needs (01/18/2023)  Utilities: Not At Risk (01/18/2023)  Alcohol Screen: High Risk (06/30/2022)  Tobacco Use: High Risk (01/14/2023)     Readmission Risk Interventions     No data to display

## 2023-01-24 NOTE — TOC Progression Note (Signed)
Transition of Care [facility]) - Progression Note    Patient Details  Name: Andrew Sparks MRN: [id number] Date of Birth: 06/11/1966  [facility]) CM/SW Contact  Gerilyn Pilgrim, LCSW Phone Number: 01/24/2023, 11:34 AM  Clinical Narrative:   Josem Kaufmann started for [facility].     Expected Discharge Plan: [place] Barriers to Discharge: Continued Medical Work up  Expected Discharge Plan and Services   Discharge Planning Services: CM Consult Post Acute Care Choice: [place] arrangements for the past 2 months: Single Family Home                 DME Arranged: N/A DME Agency: NA       HH Arranged: RN, PT, OT HH Agency: [place] Date [place]: 01/16/23 Time [place]: 1127 Representative spoke with at [place]: [place]   Social Determinants of Health ([place]) Interventions SDOH Screenings   Food Insecurity: No Food Insecurity (01/18/2023)  Housing: Low Risk  (01/18/2023)  Transportation Needs: No Transportation Needs (01/18/2023)  Utilities: Not At Risk (01/18/2023)  Alcohol Screen: High Risk (06/30/2022)  Tobacco Use: High Risk (01/14/2023)    Readmission Risk Interventions     No data to display

## 2023-01-24 NOTE — Discharge Summary (Addendum)
Physician Discharge Summary   Patient: Andrew Sparks MRN: [id number]  DOB: 11-24-1966   Admit:     Date of Admission: 01/14/2023 Admitted from: home   Discharge: Date of discharge: 01/24/23 Disposition: Home Condition at discharge: fair  CODE STATUS: FULL CODE     Discharge Physician: Emeterio Reeve, DO [facility]     PCP: Jodi Marble, MD  Recommendations for Outpatient Follow-up:  Follow up with PCP Jodi Marble, MD in 1-2 weeks Please obtain labs/tests: CBC, CMP in 1-2 weeks Ensure follow up with hematology if Plt not improving  Please follow up on the following pending results: none   Discharge Instructions     Diet - low sodium heart healthy   Complete by: As directed    Discharge instructions   Complete by: As directed    TAKE ALL MEDICATIONS AS DIRECTED FOLLOW UP WITH SPECIALISTS AND PRIMARY CARE   Increase activity slowly   Complete by: As directed          Discharge Diagnoses: Active Problems:   Sepsis with acute hypoxic respiratory failure without septic shock (HCC)   Seizure-like activity (HCC)   Thrombocytopenia (HCC)   Paroxysmal atrial fibrillation (HCC)   Chronic diastolic CHF (congestive heart failure) (HCC)   Alcohol abuse   Essential hypertension   Major depressive disorder, recurrent severe without psychotic features ([place])   Tobacco abuse   Dyslipidemia   Obesity (BMI 30-39.9)       Hospital Course: Andrew Sparks is a 57 y.o. male with medical history significant of paroxysmal atrial fibrillation, chronic HFpEF, chronic respiratory failure on 2 to 3 L at home hypertension, alcohol abuse, seizure disorder and depression was brought to ED via EMS when he fell out of chair and had reported seizure-like activity.  Patient states that he had stopped taking most of his home medications including his Keppra due to lack of money.  02/11: In the emergency room, patient noted to be hypoxic  requiring 6 L of oxygen, atrial fibrillation with rapid ventricular rate and lab work noteworthy for alcohol level at 419 and lactic acid level 3.4.  Chest x-ray noted multilobar pneumonia with concerns for aspiration.  Patient started on IV Unasyn and Keppra.  Also noted to have thrombocytopenia.Patient admitted to hospitalist service.   Hematology consulted for low platelets.  Patient also voiced some suicidal thoughts without plan and psychiatry consulted.   Over next few days, patient's symptoms improved.  Oxygenation improved down to baseline.  PE ruled out.  Hematology recommended restarting anticoagulation only after platelet count above 50, which was by 2/15.   Patient seen by physical therapy who recommended skilled nursing.   Psychiatry felt patient not a danger to himself and not a suicide risk. Patient declined SNF and is being d/c home w/ home health  Consultants:  Hematology Psychiatry   Procedures: none      ASSESSMENT & PLAN:   Active Problems:   Sepsis with acute hypoxic respiratory failure without septic shock (HCC)   Seizure-like activity (HCC)   Thrombocytopenia (HCC)   Paroxysmal atrial fibrillation (HCC)   Chronic diastolic CHF (congestive heart failure) (HCC)   Alcohol abuse   Essential hypertension   Major depressive disorder, recurrent severe without psychotic features ([place])   Tobacco abuse   Dyslipidemia   Obesity (BMI 30-39.9)   Acute on chronic respiratory failure with hypoxia (HCC)-resolved as of 01/18/2023 Secondary to aspiration pneumonia.   Uses 3 to 4 L at baseline  Continue home O2     Sepsis with acute hypoxic respiratory failure without septic shock [facility]) Patient met severe sepsis criteria with tachycardia, tachypnea, imaging concerning for pneumonia.  Elevated lactic acid at 3.4>>2.9>>1.7 and AKI.   Unasyn and Zithromax-completed a 5-day course All cultures to date have been negative except for a Parvovirus B19 IgG titer.    Seizure-like  activity [facility]) Patient has an history of seizure disorder and stopped taking his Keppra for about a month.  No seizure-like activity noted in ED.  Initially received loading dose Keppra continue on home dose of Keppra.   Thrombocytopenia ([place]) Acute onset thrombocytopenia with platelet at 54>>44>>45.  No obvious bleeding.  LDH mildly elevated.  Smear was negative for any abnormal morphology. Platelets normal 68-monthago.RUQ [place] for hepatics cirrhosis, can be due to alcohol use. 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   Hematology was consulted.  Platelet count staying now above 50, Eliquis restarted as of 2/15, and platelet count still steadily improving. Continue ELiquis Monitor Plt/CBC outpatient    Paroxysmal atrial fibrillation (HCC) EKG with A-fib and RVR.   Patient was not very compliant with his home amiodarone and metoprolol. Heart rate with some improvement after increasing the dose of metoprolol Home metoprolol Continue amiodarone Initially held home Eliquis due to thrombocytopenia-restarted on 2/15   Chronic diastolic CHF (congestive heart failure) [place] done in November 2023 with normal EF and grade 1 diastolic dysfunction. BNP of 227.  1+ lower extremity edema.  Patient received 1 dose of IV Lasix Restart home Lasix at 40 mg daily p.o. Avoid excess fluid. Continue to monitor   Alcohol abuse Markedly elevated ethanol levels on admission.   Patient uses liquor regularly. Counseling was provided. CIWA protocol with Ativan - outside window for withdrawals   Essential hypertension Patient was on multiple antihypertensives at home which he was not taking it for the past couple of weeks.   Blood pressure now within goal after restarting home amlodipine. Continue home amlodipine Metoprolol dose was increased and  restarted home Lasix   Major depressive disorder, recurrent severe without psychotic features ([facility] Patient exhibits some suicidal thoughts on first night of hospitalization so he was IVC'ed.  Denies any suicidal thoughts by following morning.   Seen by psychiatry and cleared.   Had not been taking his home Seroquel or Effexor.   Since hospitalization, patient has been more anxious.   Restarted Effexor and Seroquel.     AKI (acute kidney injury) (HCC)-resolved as of 01/18/2023 Resolved with hydration Most likely secondary to sepsis.   Monitor renal function Avoid nephrotoxin   Tobacco abuse Counseling was provided. Nicotine patch   Dyslipidemia Continue statin   Obesity (BMI 30-39.9) Meets criteria BMI greater than 30          Discharge Instructions  Allergies as of 01/24/2023   No Known Allergies      Medication List     STOP taking these medications    buPROPion 150 MG 24 hr tablet Commonly known as: WELLBUTRIN XL   chlorthalidone 25 MG tablet Commonly known as: HYGROTON   FLUoxetine 40 MG capsule Commonly known as: PROZAC   hydrALAZINE 100 MG tablet Commonly known as: APRESOLINE   valsartan 320 MG tablet Commonly known as: DIOVAN       TAKE these medications    albuterol 108 (90 Base)  MCG/ACT inhaler Commonly known as: VENTOLIN HFA Inhale 1-2 puffs into the lungs as directed.   amiodarone 200 MG tablet Commonly known as: PACERONE Take 1 tablet (200 mg total) by mouth daily.   amLODipine 2.5 MG tablet Commonly known as: NORVASC Take 1 tablet (2.5 mg total) by mouth daily. What changed: Another medication with the same name was removed. Continue taking this medication, and follow the directions you see here.   apixaban 5 MG Tabs tablet Commonly known as: ELIQUIS Take 1 tablet (5 mg total) by mouth 2 (two) times daily.   furosemide 40 MG tablet Commonly known as: LASIX Take 1 tablet (40 mg total) by mouth daily.   gabapentin 400 MG  capsule Commonly known as: NEURONTIN Take 1 capsule (400 mg total) by mouth 3 (three) times daily.   levETIRAcetam 500 MG tablet Commonly known as: KEPPRA Take 1 tablet (500 mg total) by mouth 2 (two) times daily.   lisinopril 40 MG tablet Commonly known as: ZESTRIL Take 1 tablet (40 mg total) by mouth daily.   metoprolol tartrate 100 MG tablet Commonly known as: LOPRESSOR Take 1 tablet (100 mg total) by mouth 2 (two) times daily. What changed:  medication strength how much to take   QUEtiapine 50 MG tablet Commonly known as: SEROQUEL Take 1 tablet (50 mg total) by mouth at bedtime. What changed:  medication strength how much to take   rosuvastatin 20 MG tablet Commonly known as: CRESTOR Take 1 tablet (20 mg total) by mouth at bedtime.   venlafaxine XR 75 MG 24 hr capsule Commonly known as: EFFEXOR-XR Take 1 capsule (75 mg total) by mouth daily with breakfast.         Contact information for after-discharge care     Destination     [facility] .   Service: Skilled Nursing Contact information: [place] [phone]                     No Known Allergies   Subjective: pt feeling well today, wants to go home, confirmed he does not want to go to SNF and he has help at home if needed.    Discharge Exam: BP 126/87 (BP Location: Right Arm)   Pulse 75   Temp 99.1 F (37.3 C)   Resp 18   Ht 5' 11"$  (1.803 m)   Wt 115.9 kg   SpO2 100%   BMI 35.64 kg/m  General: Pt is alert, awake, not in acute distress Cardiovascular: RRR, S1/S2 +, no rubs, no gallops Respiratory: CTA bilaterally, no wheezing, no rhonchi Abdominal: Soft, NT, ND, bowel sounds + Extremities: no edema, no cyanosis     The results of significant diagnostics from this hospitalization (including imaging, microbiology, ancillary and laboratory) are listed below for reference.      Microbiology: Recent Results (from the past 240 hour(s))  Culture, blood (Routine X 2) w Reflex to ID Panel     Status: None   Collection Time: 01/15/23  8:43 AM   Specimen: BLOOD  Result Value Ref Range Status   Specimen Description BLOOD BLOOD LEFT HAND  Final   Special Requests   Final    BOTTLES DRAWN AEROBIC AND ANAEROBIC Blood Culture adequate volume   Culture   Final    NO GROWTH 5 DAYS Performed at [facility], [address] [place], [place] [address]    Report Status 01/20/2023 FINAL  Final  Culture, blood (Routine X 2) w Reflex to ID Panel     Status: None   Collection Time: 01/15/23  9:00 AM   Specimen: BLOOD  Result Value Ref Range Status   Specimen Description BLOOD BLOOD RIGHT HAND  Final   Special Requests   Final    BOTTLES DRAWN AEROBIC AND ANAEROBIC Blood Culture adequate volume   Culture   Final    NO GROWTH 5 DAYS Performed at [facility], [address] [place], [place] [address]    Report Status 01/20/2023 FINAL  Final  Resp panel by RT-PCR (RSV, Flu A&B, Covid) Anterior Nasal Swab     Status: None   Collection Time: 01/21/23  7:41 PM   Specimen: Anterior Nasal Swab  Result Value Ref Range Status   SARS Coronavirus 2 by RT PCR NEGATIVE NEGATIVE Final   Influenza A by PCR NEGATIVE NEGATIVE Final   Influenza B by PCR NEGATIVE NEGATIVE Final   Resp Syncytial Virus by PCR NEGATIVE NEGATIVE Final    Comment: Performed at [facility], [address] [place], [place] [address]     Labs: BNP (last 3 results) Recent Labs    01/14/23 0400  BNP 123XX123*   Basic Metabolic Panel: Recent Labs  Lab 01/18/23 0518 01/19/23 0335 01/20/23 0632 01/21/23 0559 01/22/23 0428  NA 134* 132* 139 137 140  K 3.8 3.6 3.7 3.7 3.6  CL 97* 94* 100 98 97*  CO2 28 29 31 $ 34* 35*  GLUCOSE 95 92 92 86 80  BUN 29* 26* 24* 21* 21*  CREATININE 1.29* 1.22 1.10 1.07 1.03  CALCIUM 8.2* 8.0* 8.6* 8.3* 8.7*   Liver Function Tests: No  results for input(s): "AST", "ALT", "ALKPHOS", "BILITOT", "PROT", "ALBUMIN" in the last 168 hours. No results for input(s): "LIPASE", "AMYLASE" in the last 168 hours. No results for input(s): "AMMONIA" in the last 168 hours. CBC: Recent Labs  Lab 01/19/23 0335 01/20/23 0632 01/21/23 0559 01/22/23 0428 01/23/23 0342  WBC 4.5 3.5* 3.5* 3.6* 2.7*  HGB 10.6* 9.2* 9.3* 9.5* 9.1*  HCT 33.7* 30.0* 29.6* 30.2* 28.7*  MCV 108.4* 107.9* 107.2* 108.2* 109.1*  PLT 84* 100* 140* 154 155   Cardiac Enzymes: No results for input(s): "CKTOTAL", "CKMB", "CKMBINDEX", "TROPONINI" in the last 168 hours. BNP: Invalid input(s): "POCBNP" CBG: Recent Labs  Lab 01/21/23 0811 01/21/23 1126 01/21/23 1614 01/22/23 0737 01/24/23 0743  GLUCAP 93 108* 102* 90 101*   D-Dimer No results for input(s): "DDIMER" in the last 72 hours. Hgb A1c No results for input(s): "HGBA1C" in the last 72 hours. Lipid Profile No results for input(s): "CHOL", "HDL", "LDLCALC", "TRIG", "CHOLHDL", "LDLDIRECT" in the last 72 hours. Thyroid function studies No results for input(s): "TSH", "T4TOTAL", "T3FREE", "THYROIDAB" in the last 72 hours.  Invalid input(s): "FREET3" Anemia work up No results for input(s): "VITAMINB12", "FOLATE", "FERRITIN", "TIBC", "IRON", "RETICCTPCT" in the last 72 hours. Urinalysis    Component Value Date/Time   COLORURINE AMBER (A) 01/14/2023 2100   APPEARANCEUR HAZY (A) 01/14/2023 2100   APPEARANCEUR Clear 12/11/2014 1147   LABSPEC 1.022 01/14/2023 2100   LABSPEC 1.020 12/11/2014 1147   PHURINE 5.0 01/14/2023 2100   GLUCOSEU NEGATIVE 01/14/2023 2100   GLUCOSEU Negative 12/11/2014 1147   HGBUR SMALL (A) 01/14/2023 2100   BILIRUBINUR NEGATIVE 01/14/2023 2100   BILIRUBINUR Negative 12/11/2014 [place] NEGATIVE 01/14/2023 2100   PROTEINUR >=300 (A) 01/14/2023 2100   NITRITE NEGATIVE 01/14/2023 2100   LEUKOCYTESUR TRACE (  A) 01/14/2023 2100   LEUKOCYTESUR Negative 12/11/2014 1147    Sepsis Labs Recent Labs  Lab 01/20/23 0632 01/21/23 0559 01/22/23 0428 01/23/23 0342  WBC 3.5* 3.5* 3.6* 2.7*   Microbiology Recent Results (from the past 240 hour(s))  Culture, blood (Routine X 2) w Reflex to ID Panel     Status: None   Collection Time: 01/15/23  8:43 AM   Specimen: BLOOD  Result Value Ref Range Status   Specimen Description BLOOD BLOOD LEFT HAND  Final   Special Requests   Final    BOTTLES DRAWN AEROBIC AND ANAEROBIC Blood Culture adequate volume   Culture   Final    NO GROWTH 5 DAYS Performed at [facility], [address] [place], [place] [address]    Report Status 01/20/2023 FINAL  Final  Culture, blood (Routine X 2) w Reflex to ID Panel     Status: None   Collection Time: 01/15/23  9:00 AM   Specimen: BLOOD  Result Value Ref Range Status   Specimen Description BLOOD BLOOD RIGHT HAND  Final   Special Requests   Final    BOTTLES DRAWN AEROBIC AND ANAEROBIC Blood Culture adequate volume   Culture   Final    NO GROWTH 5 DAYS Performed at [facility], [address] [place], [place] [address]    Report Status 01/20/2023 FINAL  Final  Resp panel by RT-PCR (RSV, Flu A&B, Covid) Anterior Nasal Swab     Status: None   Collection Time: 01/21/23  7:41 PM   Specimen: Anterior Nasal Swab  Result Value Ref Range Status   SARS Coronavirus 2 by RT PCR NEGATIVE NEGATIVE Final   Influenza A by PCR NEGATIVE NEGATIVE Final   Influenza B by PCR NEGATIVE NEGATIVE Final   Resp Syncytial Virus by PCR NEGATIVE NEGATIVE Final    Comment: Performed at [facility], [address], [place], [place] [address]   Imaging NM Hepatobiliary Liver Func  Addendum Date: 01/16/2023   ADDENDUM REPORT: 01/16/2023 16:13 ADDENDUM: Dose: [id number] millicuries technetium 99 Choletec. Electronically Signed   By: Ronney Asters M.D.   On: 01/16/2023 16:13   Result Date: 01/16/2023 CLINICAL DATA:  Cholelithiasis. EXAM: NUCLEAR MEDICINE HEPATOBILIARY IMAGING  TECHNIQUE: Sequential images of the abdomen were obtained out to 60 minutes following intravenous administration of radiopharmaceutical. RADIOPHARMACEUTICALS:  mCi Tc-4m Choletec IV COMPARISON:  CT chest 01/16/2023.  Abdominal ultrasound 01/16/2023. FINDINGS: Prompt uptake and biliary excretion of activity by the liver is seen. Gallbladder activity is visualized, consistent with patency of cystic duct. Biliary activity passes into small bowel, consistent with patent common bile duct. Although an ejection fraction was not calculated, there is significant retention of radiotracer within distended gallbladder on 60 minute images. IMPRESSION: 1.  No evidence for biliary duct obstruction. 2. Significant retention of radiotracer within distended gallbladder at 60 minutes can be seen in the setting of functional gallbladder dysfunction. Electronically Signed: By: ARonney AstersM.D. On: 01/16/2023 15:51   CT Angio Chest Pulmonary Embolism (PE) W or WO Contrast  Result Date: 01/16/2023 CLINICAL DATA:  Respiratory failure EXAM: CT ANGIOGRAPHY CHEST WITH CONTRAST TECHNIQUE: Multidetector CT imaging of the chest was performed using the standard protocol during bolus administration of intravenous contrast. Multiplanar CT image reconstructions and MIPs were obtained to evaluate the vascular anatomy. Severe motion artifact significantly limits this examination. RADIATION DOSE REDUCTION: This exam was performed according to the departmental dose-optimization program which includes automated exposure control, adjustment of the mA  and/or kV according to patient size and/or use of iterative reconstruction technique. CONTRAST:  75 mL OMNIPAQUE IOHEXOL 350 MG/ML SOLN COMPARISON:  01/14/2023 FINDINGS: Cardiovascular: Note is made of cardiomegaly. No pericardial effusion identified. The study is nondiagnostic for PE due to motion artifact. Consider a V/Q scan for further assessment for PE. There is evidence of reflux of contrast  into the IVC consistent with tricuspid insufficiency. Aorta demonstrates atheromatous changes and no evidence of significant aneurysmal dilatation. Mediastinum/Nodes: Left prevascular node measures 1.2 cm. Right paratracheal node measures 0.9 cm. Unremarkable appearance of thyroid and upper mediastinal structures and esophagus. Lungs/Pleura: Small pleural effusion on the right. There is suggestion of diffuse alveolar opacity consistent with pulmonary edema. Evaluation was limited by severe motion artifact. Upper Abdomen: No acute abnormality. Musculoskeletal: No chest wall abnormality. No acute or significant osseous findings. Review of the MIP images confirms the above findings. IMPRESSION: 1. This is a very limited study due to motion artifact. 2. No gross PE identified, but this study is nondiagnostic. Consider V/Q scan for further assessment. 3. Small pleural effusion on the right. 4. Diffuse bilateral pulmonary alveolar opacity consistent with pulmonary edema. A bilateral pneumonia could also be considered. 5. Cardiomegaly. Electronically Signed   By: Sammie Bench M.D.   On: 01/16/2023 14:06   US Abdomen Limited RUQ (LIVER/GB)  Result Date: 01/16/2023 CLINICAL DATA:  Altered mental status, thrombocytopenia, alcohol abuse, rule out cirrhosis EXAM: ULTRASOUND ABDOMEN LIMITED RIGHT UPPER QUADRANT COMPARISON:  None Available. FINDINGS: Gallbladder: Small gallstones. Gallbladder wall thickening up to 0.4 cm. Small volume pericholecystic fluid. No sonographic Murphy sign noted by sonographer. Common bile duct: Diameter: 0.3 cm Liver: No focal lesion identified. Increased parenchymal echogenicity. Portal vein is patent on color Doppler imaging with normal direction of blood flow towards the liver. Other: None. IMPRESSION: 1. Cholelithiasis with gallbladder wall thickening and pericholecystic fluid. No sonographic Murphy sign. Findings are equivocal for acute cholecystitis. Consider further evaluation with  nuclear medicine HIDA scan. 2. Hepatic steatosis. 3. No overt cirrhotic morphology. Electronically Signed   By: Delanna Ahmadi M.D.   On: 01/16/2023 12:42   US Venous Img Lower Bilateral (DVT)  Result Date: 01/16/2023 CLINICAL DATA:  Elevated D-dimer level. EXAM: BILATERAL LOWER EXTREMITY VENOUS DOPPLER ULTRASOUND TECHNIQUE: Gray-scale sonography with graded compression, as well as color Doppler and duplex ultrasound were performed to evaluate the lower extremity deep venous systems from the level of the common femoral vein and including the common femoral, femoral, profunda femoral, popliteal and calf veins including the posterior tibial, peroneal and gastrocnemius veins when visible. The superficial great saphenous vein was also interrogated. Spectral Doppler was utilized to evaluate flow at rest and with distal augmentation maneuvers in the common femoral, femoral and popliteal veins. COMPARISON:  None Available. FINDINGS: RIGHT LOWER EXTREMITY Common Femoral Vein: No evidence of thrombus. Normal compressibility, respiratory phasicity and response to augmentation. Saphenofemoral Junction: No evidence of thrombus. Normal compressibility and flow on color Doppler imaging. Profunda Femoral Vein: No evidence of thrombus. Normal compressibility and flow on color Doppler imaging. Femoral Vein: No evidence of thrombus. Normal compressibility, respiratory phasicity and response to augmentation. Popliteal Vein: No evidence of thrombus. Normal compressibility, respiratory phasicity and response to augmentation. Calf Veins: No evidence of thrombus. Normal compressibility and flow on color Doppler imaging. Superficial Great Saphenous Vein: No evidence of thrombus. Normal compressibility. Venous Reflux:  None. Other Findings:  None. LEFT LOWER EXTREMITY Common Femoral Vein: No evidence of thrombus. Normal compressibility, respiratory phasicity and response to augmentation.  Saphenofemoral Junction: No evidence of thrombus.  Normal compressibility and flow on color Doppler imaging. Profunda Femoral Vein: No evidence of thrombus. Normal compressibility and flow on color Doppler imaging. Femoral Vein: No evidence of thrombus. Normal compressibility, respiratory phasicity and response to augmentation. Popliteal Vein: No evidence of thrombus. Normal compressibility, respiratory phasicity and response to augmentation. Calf Veins: No evidence of thrombus. Normal compressibility and flow on color Doppler imaging. Superficial Great Saphenous Vein: No evidence of thrombus. Normal compressibility. Venous Reflux:  None. Other Findings: Probable effusion or Baker's cyst is seen in the medial portion of the left knee. IMPRESSION: No evidence of deep venous thrombosis in either lower extremity. Electronically Signed   By: Marijo Conception M.D.   On: 01/16/2023 12:36      Time coordinating discharge: over 30 minutes  SIGNED:  Emeterio Reeve DO [facility]

## 2023-01-24 NOTE — Progress Notes (Signed)
Physical Therapy Treatment Patient Details Name: Andrew Sparks MRN: [id number] DOB: May 15, 1966 Today's Date: 01/24/2023   History of Present Illness Patient is a 57 year old male with concern for seizure at home. Acute on chronic respiratory failure with hypoxia, sepsis due to pneumonia. Medical history significant of paroxysmal atrial fibrillation, chronic HFpEF, hypertension, alcohol abuse, seizure disorder and depression.    PT Comments    Patient is agreeable to PT. He continues to require assistance for standing and short distance ambulation. Activity tolerance is limited by fatigue. Recommend to continue PT to maximize independence and facilitate return to prior level of function. Still recommending SNF at this time.    Recommendations for follow up therapy are one component of a multi-disciplinary discharge planning process, led by the attending physician.  Recommendations may be updated based on patient status, additional functional criteria and insurance authorization.  Follow Up Recommendations  Skilled nursing-short term rehab (<3 hours/day) Can patient physically be transported by private vehicle: No   Assistance Recommended at Discharge Frequent or constant Supervision/Assistance  Patient can return home with the following A lot of help with walking and/or transfers;A lot of help with bathing/dressing/bathroom;Help with stairs or ramp for entrance;Assist for transportation;Assistance with cooking/housework;Direct supervision/assist for medications management   Equipment Recommendations  None recommended by PT    Recommendations for Other Services       Precautions / Restrictions Precautions Precautions: Fall Restrictions Weight Bearing Restrictions: No     Mobility  Bed Mobility               General bed mobility comments: not assessed    Transfers Overall transfer level: Needs assistance Equipment used: Rolling walker (2 wheels) Transfers: Sit  to/from Stand Sit to Stand: Min guard           General transfer comment: verbal cues for safety, hand placement    Ambulation/Gait Ambulation/Gait assistance: Min assist Gait Distance (Feet): 80 Feet Assistive device: Rolling walker (2 wheels) Gait Pattern/deviations: Step-through pattern Gait velocity: decreased     General Gait Details: steadying assistance provided with ambulation. cues for upright standing posture. patient is fatigued with activity   Stairs             Wheelchair Mobility    Modified Rankin (Stroke Patients Only)       Balance Overall balance assessment: Needs assistance Sitting-balance support: Feet supported Sitting balance-Leahy Scale: Fair     Standing balance support: Bilateral upper extremity supported, During functional activity Standing balance-Leahy Scale: Fair Standing balance comment: with UE supported on rolling walker                            Cognition Arousal/Alertness: Awake/alert Behavior During Therapy: WFL for tasks assessed/performed Overall Cognitive Status: Within Functional Limits for tasks assessed                                          Exercises      General Comments        Pertinent Vitals/Pain Pain Assessment Pain Assessment: No/denies pain    Home Living                          Prior Function            PT Goals (current goals can now be  found in the care plan section) Acute Rehab PT Goals Patient Stated Goal: to go home PT Goal Formulation: With patient Time For Goal Achievement: 01/29/23 Potential to Achieve Goals: Fair Progress towards PT goals: Progressing toward goals    Frequency    Min 2X/week      PT Plan Current plan remains appropriate    Co-evaluation              AM-PAC PT "6 Clicks" Mobility   Outcome Measure  Help needed turning from your back to your side while in a flat bed without using bedrails?: A  Little Help needed moving from lying on your back to sitting on the side of a flat bed without using bedrails?: A Little Help needed moving to and from a bed to a chair (including a wheelchair)?: A Little Help needed standing up from a chair using your arms (e.g., wheelchair or bedside chair)?: A Little Help needed to walk in hospital room?: Total Help needed climbing 3-5 steps with a railing? : Total 6 Click Score: 14    End of Session Equipment Utilized During Treatment: Oxygen Activity Tolerance: Patient limited by fatigue Patient left: in chair;with call bell/phone within reach;with chair alarm set Nurse Communication: Mobility status PT Visit Diagnosis: Unsteadiness on feet (R26.81);Muscle weakness (generalized) (M62.81)     Time: [id number] PT Time Calculation (min) (ACUTE ONLY): 17 min  Charges:  $Therapeutic Activity: 8-22 mins                     [name], PT, MPT    [name] 01/24/2023, 3:28 PM

## 2023-01-27 DIAGNOSIS — R0902 Hypoxemia: Secondary | ICD-10-CM | POA: Diagnosis not present

## 2023-01-27 DIAGNOSIS — F10929 Alcohol use, unspecified with intoxication, unspecified: Secondary | ICD-10-CM | POA: Diagnosis not present

## 2023-01-27 DIAGNOSIS — I4819 Other persistent atrial fibrillation: Secondary | ICD-10-CM | POA: Diagnosis not present

## 2023-01-27 DIAGNOSIS — E874 Mixed disorder of acid-base balance: Secondary | ICD-10-CM | POA: Diagnosis not present

## 2023-01-27 DIAGNOSIS — J811 Chronic pulmonary edema: Secondary | ICD-10-CM | POA: Diagnosis not present

## 2023-01-27 DIAGNOSIS — Z720 Tobacco use: Secondary | ICD-10-CM | POA: Diagnosis not present

## 2023-01-27 DIAGNOSIS — G629 Polyneuropathy, unspecified: Secondary | ICD-10-CM | POA: Diagnosis not present

## 2023-01-27 DIAGNOSIS — U071 COVID-19: Secondary | ICD-10-CM | POA: Diagnosis not present

## 2023-01-27 DIAGNOSIS — D72819 Decreased white blood cell count, unspecified: Secondary | ICD-10-CM | POA: Diagnosis not present

## 2023-01-27 DIAGNOSIS — J9621 Acute and chronic respiratory failure with hypoxia: Secondary | ICD-10-CM | POA: Diagnosis not present

## 2023-01-27 DIAGNOSIS — E785 Hyperlipidemia, unspecified: Secondary | ICD-10-CM | POA: Diagnosis not present

## 2023-01-27 DIAGNOSIS — I10 Essential (primary) hypertension: Secondary | ICD-10-CM | POA: Diagnosis not present

## 2023-01-27 DIAGNOSIS — G4089 Other seizures: Secondary | ICD-10-CM | POA: Diagnosis not present

## 2023-01-27 DIAGNOSIS — F3181 Bipolar II disorder: Secondary | ICD-10-CM | POA: Diagnosis not present

## 2023-01-27 DIAGNOSIS — R498 Other voice and resonance disorders: Secondary | ICD-10-CM | POA: Diagnosis not present

## 2023-01-27 DIAGNOSIS — J4541 Moderate persistent asthma with (acute) exacerbation: Secondary | ICD-10-CM | POA: Diagnosis not present

## 2023-01-27 DIAGNOSIS — R2689 Other abnormalities of gait and mobility: Secondary | ICD-10-CM | POA: Diagnosis not present

## 2023-01-27 DIAGNOSIS — I4891 Unspecified atrial fibrillation: Secondary | ICD-10-CM | POA: Diagnosis not present

## 2023-01-27 DIAGNOSIS — D649 Anemia, unspecified: Secondary | ICD-10-CM | POA: Diagnosis not present

## 2023-01-27 DIAGNOSIS — R69 Illness, unspecified: Secondary | ICD-10-CM | POA: Diagnosis not present

## 2023-01-27 DIAGNOSIS — F32A Depression, unspecified: Secondary | ICD-10-CM | POA: Diagnosis not present

## 2023-01-27 DIAGNOSIS — E669 Obesity, unspecified: Secondary | ICD-10-CM | POA: Diagnosis not present

## 2023-01-27 DIAGNOSIS — J9690 Respiratory failure, unspecified, unspecified whether with hypoxia or hypercapnia: Secondary | ICD-10-CM | POA: Diagnosis not present

## 2023-01-27 DIAGNOSIS — G40909 Epilepsy, unspecified, not intractable, without status epilepticus: Secondary | ICD-10-CM | POA: Diagnosis not present

## 2023-01-27 DIAGNOSIS — F17210 Nicotine dependence, cigarettes, uncomplicated: Secondary | ICD-10-CM | POA: Diagnosis not present

## 2023-01-27 DIAGNOSIS — F419 Anxiety disorder, unspecified: Secondary | ICD-10-CM | POA: Diagnosis not present

## 2023-01-27 DIAGNOSIS — I480 Paroxysmal atrial fibrillation: Secondary | ICD-10-CM | POA: Diagnosis not present

## 2023-01-27 DIAGNOSIS — J9601 Acute respiratory failure with hypoxia: Secondary | ICD-10-CM | POA: Diagnosis not present

## 2023-01-27 DIAGNOSIS — J45901 Unspecified asthma with (acute) exacerbation: Secondary | ICD-10-CM | POA: Diagnosis not present

## 2023-01-27 DIAGNOSIS — R079 Chest pain, unspecified: Secondary | ICD-10-CM | POA: Diagnosis not present

## 2023-01-27 DIAGNOSIS — F319 Bipolar disorder, unspecified: Secondary | ICD-10-CM | POA: Diagnosis not present

## 2023-01-27 DIAGNOSIS — I4510 Unspecified right bundle-branch block: Secondary | ICD-10-CM | POA: Diagnosis not present

## 2023-01-27 DIAGNOSIS — I517 Cardiomegaly: Secondary | ICD-10-CM | POA: Diagnosis not present

## 2023-01-27 DIAGNOSIS — I69854 Hemiplegia and hemiparesis following other cerebrovascular disease affecting left non-dominant side: Secondary | ICD-10-CM | POA: Diagnosis not present

## 2023-01-27 DIAGNOSIS — I5033 Acute on chronic diastolic (congestive) heart failure: Secondary | ICD-10-CM | POA: Diagnosis not present

## 2023-01-27 DIAGNOSIS — Z930 Tracheostomy status: Secondary | ICD-10-CM | POA: Diagnosis not present

## 2023-01-27 DIAGNOSIS — E871 Hypo-osmolality and hyponatremia: Secondary | ICD-10-CM | POA: Diagnosis not present

## 2023-01-27 DIAGNOSIS — R4781 Slurred speech: Secondary | ICD-10-CM | POA: Diagnosis not present

## 2023-01-27 DIAGNOSIS — J449 Chronic obstructive pulmonary disease, unspecified: Secondary | ICD-10-CM | POA: Diagnosis not present

## 2023-01-27 DIAGNOSIS — I110 Hypertensive heart disease with heart failure: Secondary | ICD-10-CM | POA: Diagnosis not present

## 2023-01-27 DIAGNOSIS — G4700 Insomnia, unspecified: Secondary | ICD-10-CM | POA: Diagnosis not present

## 2023-01-27 DIAGNOSIS — I5030 Unspecified diastolic (congestive) heart failure: Secondary | ICD-10-CM | POA: Diagnosis not present

## 2023-01-27 DIAGNOSIS — I509 Heart failure, unspecified: Secondary | ICD-10-CM | POA: Diagnosis not present

## 2023-01-27 DIAGNOSIS — J9612 Chronic respiratory failure with hypercapnia: Secondary | ICD-10-CM | POA: Diagnosis not present

## 2023-01-27 DIAGNOSIS — J1282 Pneumonia due to coronavirus disease 2019: Secondary | ICD-10-CM | POA: Diagnosis not present

## 2023-01-27 DIAGNOSIS — E876 Hypokalemia: Secondary | ICD-10-CM | POA: Diagnosis not present

## 2023-01-27 DIAGNOSIS — G4733 Obstructive sleep apnea (adult) (pediatric): Secondary | ICD-10-CM | POA: Diagnosis not present

## 2023-01-27 DIAGNOSIS — J810 Acute pulmonary edema: Secondary | ICD-10-CM | POA: Diagnosis not present

## 2023-01-27 DIAGNOSIS — J441 Chronic obstructive pulmonary disease with (acute) exacerbation: Secondary | ICD-10-CM | POA: Diagnosis not present

## 2023-01-27 DIAGNOSIS — F1090 Alcohol use, unspecified, uncomplicated: Secondary | ICD-10-CM | POA: Diagnosis not present

## 2023-01-27 DIAGNOSIS — M6281 Muscle weakness (generalized): Secondary | ICD-10-CM | POA: Diagnosis not present

## 2023-01-29 ENCOUNTER — Other Ambulatory Visit: Payer: Self-pay | Admitting: Internal Medicine

## 2023-02-02 DIAGNOSIS — F1090 Alcohol use, unspecified, uncomplicated: Secondary | ICD-10-CM | POA: Diagnosis not present

## 2023-02-02 DIAGNOSIS — F32A Depression, unspecified: Secondary | ICD-10-CM | POA: Diagnosis not present

## 2023-02-02 DIAGNOSIS — F17210 Nicotine dependence, cigarettes, uncomplicated: Secondary | ICD-10-CM | POA: Diagnosis not present

## 2023-02-02 DIAGNOSIS — F419 Anxiety disorder, unspecified: Secondary | ICD-10-CM | POA: Diagnosis not present

## 2023-02-08 DIAGNOSIS — Z720 Tobacco use: Secondary | ICD-10-CM | POA: Diagnosis not present

## 2023-02-08 DIAGNOSIS — J9620 Acute and chronic respiratory failure, unspecified whether with hypoxia or hypercapnia: Secondary | ICD-10-CM | POA: Diagnosis not present

## 2023-02-08 DIAGNOSIS — G4733 Obstructive sleep apnea (adult) (pediatric): Secondary | ICD-10-CM | POA: Diagnosis not present

## 2023-02-08 DIAGNOSIS — D649 Anemia, unspecified: Secondary | ICD-10-CM | POA: Diagnosis not present

## 2023-02-08 DIAGNOSIS — F32A Depression, unspecified: Secondary | ICD-10-CM | POA: Diagnosis not present

## 2023-02-08 DIAGNOSIS — F1090 Alcohol use, unspecified, uncomplicated: Secondary | ICD-10-CM | POA: Diagnosis not present

## 2023-02-08 DIAGNOSIS — U071 COVID-19: Secondary | ICD-10-CM | POA: Diagnosis not present

## 2023-02-08 DIAGNOSIS — J9690 Respiratory failure, unspecified, unspecified whether with hypoxia or hypercapnia: Secondary | ICD-10-CM | POA: Diagnosis not present

## 2023-02-08 DIAGNOSIS — R498 Other voice and resonance disorders: Secondary | ICD-10-CM | POA: Diagnosis not present

## 2023-02-08 DIAGNOSIS — E876 Hypokalemia: Secondary | ICD-10-CM | POA: Diagnosis not present

## 2023-02-08 DIAGNOSIS — M6281 Muscle weakness (generalized): Secondary | ICD-10-CM | POA: Diagnosis not present

## 2023-02-08 DIAGNOSIS — J449 Chronic obstructive pulmonary disease, unspecified: Secondary | ICD-10-CM | POA: Diagnosis not present

## 2023-02-08 DIAGNOSIS — R69 Illness, unspecified: Secondary | ICD-10-CM | POA: Diagnosis not present

## 2023-02-08 DIAGNOSIS — I10 Essential (primary) hypertension: Secondary | ICD-10-CM | POA: Diagnosis not present

## 2023-02-08 DIAGNOSIS — J9621 Acute and chronic respiratory failure with hypoxia: Secondary | ICD-10-CM | POA: Diagnosis not present

## 2023-02-08 DIAGNOSIS — I4891 Unspecified atrial fibrillation: Secondary | ICD-10-CM | POA: Diagnosis not present

## 2023-02-08 DIAGNOSIS — F319 Bipolar disorder, unspecified: Secondary | ICD-10-CM | POA: Diagnosis not present

## 2023-02-08 DIAGNOSIS — I5030 Unspecified diastolic (congestive) heart failure: Secondary | ICD-10-CM | POA: Diagnosis not present

## 2023-02-08 DIAGNOSIS — I110 Hypertensive heart disease with heart failure: Secondary | ICD-10-CM | POA: Diagnosis not present

## 2023-02-08 DIAGNOSIS — R2689 Other abnormalities of gait and mobility: Secondary | ICD-10-CM | POA: Diagnosis not present

## 2023-02-08 DIAGNOSIS — J45901 Unspecified asthma with (acute) exacerbation: Secondary | ICD-10-CM | POA: Diagnosis not present

## 2023-02-08 DIAGNOSIS — R4781 Slurred speech: Secondary | ICD-10-CM | POA: Diagnosis not present

## 2023-02-08 DIAGNOSIS — G4089 Other seizures: Secondary | ICD-10-CM | POA: Diagnosis not present

## 2023-02-08 DIAGNOSIS — F10929 Alcohol use, unspecified with intoxication, unspecified: Secondary | ICD-10-CM | POA: Diagnosis not present

## 2023-02-08 DIAGNOSIS — I509 Heart failure, unspecified: Secondary | ICD-10-CM | POA: Diagnosis not present

## 2023-02-08 DIAGNOSIS — F419 Anxiety disorder, unspecified: Secondary | ICD-10-CM | POA: Diagnosis not present

## 2023-02-08 DIAGNOSIS — I480 Paroxysmal atrial fibrillation: Secondary | ICD-10-CM | POA: Diagnosis not present

## 2023-02-08 DIAGNOSIS — I69854 Hemiplegia and hemiparesis following other cerebrovascular disease affecting left non-dominant side: Secondary | ICD-10-CM | POA: Diagnosis not present

## 2023-02-08 DIAGNOSIS — J811 Chronic pulmonary edema: Secondary | ICD-10-CM | POA: Diagnosis not present

## 2023-02-08 DIAGNOSIS — J45909 Unspecified asthma, uncomplicated: Secondary | ICD-10-CM | POA: Diagnosis not present

## 2023-02-12 DIAGNOSIS — U071 COVID-19: Secondary | ICD-10-CM | POA: Diagnosis not present

## 2023-02-12 DIAGNOSIS — I509 Heart failure, unspecified: Secondary | ICD-10-CM | POA: Diagnosis not present

## 2023-02-12 DIAGNOSIS — J9620 Acute and chronic respiratory failure, unspecified whether with hypoxia or hypercapnia: Secondary | ICD-10-CM | POA: Diagnosis not present

## 2023-02-15 DIAGNOSIS — I4891 Unspecified atrial fibrillation: Secondary | ICD-10-CM | POA: Diagnosis not present

## 2023-02-15 DIAGNOSIS — J9621 Acute and chronic respiratory failure with hypoxia: Secondary | ICD-10-CM | POA: Diagnosis not present

## 2023-02-15 DIAGNOSIS — J45909 Unspecified asthma, uncomplicated: Secondary | ICD-10-CM | POA: Diagnosis not present

## 2023-02-15 DIAGNOSIS — I10 Essential (primary) hypertension: Secondary | ICD-10-CM | POA: Diagnosis not present

## 2023-02-15 DIAGNOSIS — Z720 Tobacco use: Secondary | ICD-10-CM | POA: Diagnosis not present

## 2023-02-15 DIAGNOSIS — R69 Illness, unspecified: Secondary | ICD-10-CM | POA: Diagnosis not present

## 2023-02-15 DIAGNOSIS — U071 COVID-19: Secondary | ICD-10-CM | POA: Diagnosis not present

## 2023-02-15 DIAGNOSIS — G4733 Obstructive sleep apnea (adult) (pediatric): Secondary | ICD-10-CM | POA: Diagnosis not present

## 2023-02-16 DIAGNOSIS — J9621 Acute and chronic respiratory failure with hypoxia: Secondary | ICD-10-CM | POA: Diagnosis not present

## 2023-02-16 DIAGNOSIS — R69 Illness, unspecified: Secondary | ICD-10-CM | POA: Diagnosis not present

## 2023-02-16 DIAGNOSIS — U071 COVID-19: Secondary | ICD-10-CM | POA: Diagnosis not present

## 2023-02-16 DIAGNOSIS — F1090 Alcohol use, unspecified, uncomplicated: Secondary | ICD-10-CM | POA: Diagnosis not present

## 2023-02-16 DIAGNOSIS — I10 Essential (primary) hypertension: Secondary | ICD-10-CM | POA: Diagnosis not present

## 2023-02-16 DIAGNOSIS — I4891 Unspecified atrial fibrillation: Secondary | ICD-10-CM | POA: Diagnosis not present

## 2023-02-16 DIAGNOSIS — J45909 Unspecified asthma, uncomplicated: Secondary | ICD-10-CM | POA: Diagnosis not present

## 2023-02-16 DIAGNOSIS — Z720 Tobacco use: Secondary | ICD-10-CM | POA: Diagnosis not present

## 2023-02-16 DIAGNOSIS — G4733 Obstructive sleep apnea (adult) (pediatric): Secondary | ICD-10-CM | POA: Diagnosis not present

## 2023-02-24 DIAGNOSIS — G40909 Epilepsy, unspecified, not intractable, without status epilepticus: Secondary | ICD-10-CM | POA: Diagnosis not present

## 2023-02-24 DIAGNOSIS — Z9181 History of falling: Secondary | ICD-10-CM | POA: Diagnosis not present

## 2023-02-24 DIAGNOSIS — F319 Bipolar disorder, unspecified: Secondary | ICD-10-CM | POA: Diagnosis not present

## 2023-02-24 DIAGNOSIS — J9622 Acute and chronic respiratory failure with hypercapnia: Secondary | ICD-10-CM | POA: Diagnosis not present

## 2023-02-24 DIAGNOSIS — I480 Paroxysmal atrial fibrillation: Secondary | ICD-10-CM | POA: Diagnosis not present

## 2023-02-24 DIAGNOSIS — J9621 Acute and chronic respiratory failure with hypoxia: Secondary | ICD-10-CM | POA: Diagnosis not present

## 2023-02-24 DIAGNOSIS — Z8616 Personal history of COVID-19: Secondary | ICD-10-CM | POA: Diagnosis not present

## 2023-02-24 DIAGNOSIS — F1010 Alcohol abuse, uncomplicated: Secondary | ICD-10-CM | POA: Diagnosis not present

## 2023-02-24 DIAGNOSIS — Z720 Tobacco use: Secondary | ICD-10-CM | POA: Diagnosis not present

## 2023-02-24 DIAGNOSIS — E662 Morbid (severe) obesity with alveolar hypoventilation: Secondary | ICD-10-CM | POA: Diagnosis not present

## 2023-02-24 DIAGNOSIS — Z6832 Body mass index (BMI) 32.0-32.9, adult: Secondary | ICD-10-CM | POA: Diagnosis not present

## 2023-02-24 DIAGNOSIS — J4489 Other specified chronic obstructive pulmonary disease: Secondary | ICD-10-CM | POA: Diagnosis not present

## 2023-02-24 DIAGNOSIS — Z7901 Long term (current) use of anticoagulants: Secondary | ICD-10-CM | POA: Diagnosis not present

## 2023-02-24 DIAGNOSIS — I5033 Acute on chronic diastolic (congestive) heart failure: Secondary | ICD-10-CM | POA: Diagnosis not present

## 2023-02-24 DIAGNOSIS — I110 Hypertensive heart disease with heart failure: Secondary | ICD-10-CM | POA: Diagnosis not present

## 2023-02-24 DIAGNOSIS — E782 Mixed hyperlipidemia: Secondary | ICD-10-CM | POA: Diagnosis not present

## 2023-03-12 ENCOUNTER — Telehealth: Payer: Self-pay

## 2023-03-12 NOTE — Telephone Encounter (Signed)
[facility] occupational therapy with [facility] called and left vm requesting a call back regarding verbals for OT 1w6 for ADL's, therapeutic exercises & activities, and functional transfers  [phone]

## 2023-03-13 ENCOUNTER — Ambulatory Visit: Payer: Medicare HMO | Admitting: Family

## 2023-03-20 ENCOUNTER — Ambulatory Visit: Payer: Medicare HMO | Admitting: Nurse Practitioner

## 2023-04-06 ENCOUNTER — Telehealth: Payer: Self-pay | Admitting: Internal Medicine

## 2023-04-06 NOTE — Telephone Encounter (Signed)
Micah from [facility] called to report a missed PT visit for this patient. States the patient was getting in the car to leave when he pulled up and told him that he was going to a doctors appointment. Micah also states that they have had a bit of trouble getting this patient to answer the phone and participate in home health PT. Just FYI.

## 2023-04-10 ENCOUNTER — Telehealth: Payer: Self-pay | Admitting: Internal Medicine

## 2023-04-10 NOTE — Telephone Encounter (Signed)
Sonya at [facility] called and left VM regarding orders. Called Sonya back and it was about orders that were faxed over on 5/2. Checking status, awaiting signature from doctor. Sonya notified that they are awaiting signature then will be faxed back.

## 2023-04-26 ENCOUNTER — Other Ambulatory Visit: Payer: Self-pay | Admitting: Internal Medicine

## 2023-04-27 ENCOUNTER — Ambulatory Visit: Payer: Medicare HMO | Admitting: Internal Medicine

## 2023-05-01 ENCOUNTER — Ambulatory Visit: Payer: Medicare HMO | Admitting: Internal Medicine

## 2023-05-18 ENCOUNTER — Other Ambulatory Visit: Payer: Self-pay | Admitting: Internal Medicine

## 2023-05-18 DIAGNOSIS — I10 Essential (primary) hypertension: Secondary | ICD-10-CM

## 2023-05-18 MED ORDER — LISINOPRIL 40 MG PO TABS: 40.0000 mg | ORAL_TABLET | Freq: Every day | ORAL | 0 refills | Status: DC

## 2023-06-04 ENCOUNTER — Ambulatory Visit: Payer: Medicare HMO | Admitting: Internal Medicine

## 2023-06-10 ENCOUNTER — Other Ambulatory Visit: Payer: Self-pay | Admitting: Internal Medicine

## 2023-06-12 ENCOUNTER — Other Ambulatory Visit: Payer: Self-pay | Admitting: Internal Medicine

## 2023-06-12 DIAGNOSIS — I10 Essential (primary) hypertension: Secondary | ICD-10-CM

## 2023-06-12 MED ORDER — ALBUTEROL SULFATE HFA 108 (90 BASE) MCG/ACT IN AERS: 1.0000 | INHALATION_SPRAY | RESPIRATORY_TRACT | 0 refills | Status: DC

## 2023-06-12 MED ORDER — LISINOPRIL 40 MG PO TABS: 40.0000 mg | ORAL_TABLET | Freq: Every day | ORAL | 0 refills | Status: DC

## 2023-06-12 MED ORDER — CHLORTHALIDONE 25 MG PO TABS: 25.0000 mg | ORAL_TABLET | Freq: Every morning | ORAL | 0 refills | Status: DC

## 2023-06-19 ENCOUNTER — Ambulatory Visit (INDEPENDENT_AMBULATORY_CARE_PROVIDER_SITE_OTHER): Payer: Medicare HMO | Admitting: Internal Medicine

## 2023-06-19 ENCOUNTER — Encounter: Payer: Self-pay | Admitting: Internal Medicine

## 2023-06-19 VITALS — BP 186/140 | HR 124 | Ht 70.0 in | Wt 240.0 lb

## 2023-06-19 DIAGNOSIS — I5032 Chronic diastolic (congestive) heart failure: Secondary | ICD-10-CM

## 2023-06-19 DIAGNOSIS — I10 Essential (primary) hypertension: Secondary | ICD-10-CM | POA: Diagnosis not present

## 2023-06-19 DIAGNOSIS — G621 Alcoholic polyneuropathy: Secondary | ICD-10-CM

## 2023-06-19 DIAGNOSIS — E785 Hyperlipidemia, unspecified: Secondary | ICD-10-CM

## 2023-06-19 DIAGNOSIS — F341 Dysthymic disorder: Secondary | ICD-10-CM

## 2023-06-19 MED ORDER — GABAPENTIN 600 MG PO TABS
600.0000 mg | ORAL_TABLET | Freq: Every day | ORAL | 0 refills | Status: DC
Start: 2023-06-19 — End: 2023-10-08

## 2023-06-19 MED ORDER — FUROSEMIDE 40 MG PO TABS
40.0000 mg | ORAL_TABLET | Freq: Every day | ORAL | 0 refills | Status: DC
Start: 2023-06-19 — End: 2023-07-30

## 2023-06-19 MED ORDER — CHLORTHALIDONE 25 MG PO TABS: 25.0000 mg | ORAL_TABLET | Freq: Every morning | ORAL | 0 refills | Status: DC

## 2023-06-19 MED ORDER — ROSUVASTATIN CALCIUM 20 MG PO TABS: 20.0000 mg | ORAL_TABLET | Freq: Every day | ORAL | 0 refills | Status: DC

## 2023-06-19 MED ORDER — QUETIAPINE FUMARATE 50 MG PO TABS
50.0000 mg | ORAL_TABLET | Freq: Every day | ORAL | 0 refills | Status: DC
Start: 2023-06-19 — End: 2023-10-08

## 2023-06-19 MED ORDER — LISINOPRIL 40 MG PO TABS: 40.0000 mg | ORAL_TABLET | Freq: Every day | ORAL | 0 refills | Status: DC

## 2023-06-19 MED ORDER — METOPROLOL TARTRATE 100 MG PO TABS
100.0000 mg | ORAL_TABLET | Freq: Two times a day (BID) | ORAL | 0 refills | Status: DC
Start: 2023-06-19 — End: 2023-07-30

## 2023-06-19 NOTE — Progress Notes (Signed)
Established Patient Office Visit  Subjective:  Patient ID: Andrew Sparks, male    DOB: 1966/08/04  Age: 57 y.o. MRN: [id number]  Chief Complaint  Patient presents with   Follow-up    C/o etoh abuse and is seeking admission to a rehab facility. C/o weight gain and increased leg swelling but denies SOB or orthopnea. Admits to noncompliance with his meds and subsequent bp is high today.    No other concerns at this time.   Past Medical History:  Diagnosis Date   A-fib (HCC)    Anxiety    CHF (congestive heart failure) (HCC)    Depression    Hypersomnia with sleep apnea    Hypertension    Morbid obesity (HCC)    NSTEMI (non-ST elevated myocardial infarction) (HCC) 2005   Obesity hypoventilation syndrome (HCC)    Shortness of breath dyspnea    Sleep apnea    Stroke (HCC)    V-tach [facility])     Past Surgical History:  Procedure Laterality Date   LEFT HEART CATH AND CORONARY ANGIOGRAPHY Right 09/10/2018   Procedure: LEFT HEART CATH AND CORONARY ANGIOGRAPHY with possible percutaneous intervention;  Surgeon: Laurier Nancy, MD;  Location: [facility] INVASIVE CV LAB;  Service: Cardiovascular;  Laterality: Right;   TRACHEOSTOMY      Social History   Socioeconomic History   Marital status: Single    Spouse name: Not on file   Number of children: Not on file   Years of education: Not on file   Highest education level: Not on file  Occupational History   Occupation: disabled  Tobacco Use   Smoking status: Every Day    Current packs/day: 0.25    Average packs/day: 0.3 packs/day for 28.0 years (7.0 ttl pk-yrs)    Types: Cigarettes   Smokeless tobacco: Never  Vaping Use   Vaping status: Never Used  Substance and Sexual Activity   Alcohol use: Yes    Alcohol/week: 4.0 standard drinks of alcohol    Types: 4 Cans of beer per week    Comment: 1/5 of whiskey dailty for last 5 days   Drug use: Yes    Types: Marijuana   Sexual activity: Yes  Other Topics Concern   Not on  file  Social History Narrative   Not on file   Social Determinants of Health   Financial Resource Strain: Medium Risk (01/30/2023)   Received from [facility], [facility]   Overall Financial Resource Strain (CARDIA)    Difficulty of Paying Living Expenses: Somewhat hard  Food Insecurity: No Food Insecurity (01/18/2023)   Hunger Vital Sign    Worried About Running Out of Food in the Last Year: Never true    Ran Out of Food in the Last Year: Never true  Transportation Needs: No Transportation Needs (01/18/2023)   PRAPARE - Administrator, Civil Service (Medical): No    Lack of Transportation (Non-Medical): No  Physical Activity: Not on file  Stress: Not on file  Social Connections: Not on file  Intimate Partner Violence: Not At Risk (01/18/2023)   Humiliation, Afraid, Rape, and Kick questionnaire    Fear of Current or Ex-Partner: No    Emotionally Abused: No    Physically Abused: No    Sexually Abused: No    Family History  Problem Relation Age of Onset   Heart disease Father    Diabetes type II Mother    Breast cancer Mother     No  Known Allergies  Review of Systems  Constitutional: Negative.   HENT: Negative.    Eyes: Negative.   Respiratory: Negative.    Cardiovascular: Negative.   Gastrointestinal: Negative.   Genitourinary: Negative.   Skin: Negative.   Neurological:  Positive for tingling.  Endo/Heme/Allergies: Negative.   Psychiatric/Behavioral:  The patient has insomnia.        Objective:   BP (!) 186/140   Pulse (!) 124   Ht 5\' 10"  (1.778 m)   Wt 240 lb (108.9 kg)   SpO2 94%   BMI 34.44 kg/m   Vitals:   06/19/23 1548  BP: (!) 186/140  Pulse: (!) 124  Height: 5\' 10"  (1.778 m)  Weight: 240 lb (108.9 kg)  SpO2: 94%  BMI (Calculated): 34.44    Physical Exam Vitals reviewed.  Constitutional:      Appearance: Normal appearance.  HENT:     Head: Normocephalic.     Left Ear: There is no impacted cerumen.     Nose: Nose  normal.     Mouth/Throat:     Mouth: Mucous membranes are moist.     Pharynx: No posterior oropharyngeal erythema.  Eyes:     Extraocular Movements: Extraocular movements intact.     Pupils: Pupils are equal, round, and reactive to light.  Cardiovascular:     Rate and Rhythm: Regular rhythm.     Chest Wall: PMI is not displaced.     Pulses: Normal pulses.     Heart sounds: Normal heart sounds. No murmur heard. Pulmonary:     Effort: Pulmonary effort is normal.     Breath sounds: Normal air entry. No rhonchi or rales.  Abdominal:     General: Abdomen is flat. Bowel sounds are normal. There is no distension.     Palpations: Abdomen is soft. There is no hepatomegaly, splenomegaly or mass.     Tenderness: There is no abdominal tenderness.  Musculoskeletal:        General: Normal range of motion.     Cervical back: Normal range of motion and neck supple.     Right lower leg: No edema.     Left lower leg: No edema.  Skin:    General: Skin is warm and dry.  Neurological:     General: No focal deficit present.     Mental Status: He is alert and oriented to person, place, and time.     Cranial Nerves: No cranial nerve deficit.     Motor: No weakness.  Psychiatric:        Mood and Affect: Mood normal.        Behavior: Behavior normal.      No results found for any visits on 06/19/23.  No results found for this or any previous visit (from the past 2160 hour(Gabrial Domine)).    Assessment & Plan:   Problem List Items Addressed This Visit       Cardiovascular and Mediastinum   Essential hypertension - Primary (Chronic)   Relevant Medications   chlorthalidone (HYGROTON) 25 MG tablet   lisinopril (ZESTRIL) 40 MG tablet   rosuvastatin (CRESTOR) 20 MG tablet   furosemide (LASIX) 40 MG tablet   metoprolol tartrate (LOPRESSOR) 100 MG tablet   Chronic diastolic CHF (congestive heart failure) (HCC)   Relevant Medications   chlorthalidone (HYGROTON) 25 MG tablet   lisinopril (ZESTRIL) 40 MG  tablet   rosuvastatin (CRESTOR) 20 MG tablet   furosemide (LASIX) 40 MG tablet   metoprolol tartrate (LOPRESSOR) 100 MG  tablet     Nervous and Auditory   Alcohol-induced polyneuropathy (HCC)   Relevant Medications   QUEtiapine (SEROQUEL) 50 MG tablet   gabapentin (NEURONTIN) 600 MG tablet     Other   Depression   Relevant Medications   QUEtiapine (SEROQUEL) 50 MG tablet   Dyslipidemia   Relevant Medications   rosuvastatin (CRESTOR) 20 MG tablet    Return in about 2 weeks (around 07/03/2023) for BP followup.   Total time spent: 30 minutes  Luna Fuse, MD  06/19/2023   This document may have been prepared by [facility] Voice Recognition software and as such may include unintentional dictation errors.

## 2023-07-03 ENCOUNTER — Other Ambulatory Visit: Payer: Self-pay

## 2023-07-03 ENCOUNTER — Emergency Department: Payer: Medicare HMO

## 2023-07-03 ENCOUNTER — Emergency Department
Admission: EM | Admit: 2023-07-03 | Discharge: 2023-07-03 | Disposition: A | Discharge status: Home / Self Care | Payer: Medicare HMO | Attending: Emergency Medicine | Admitting: Emergency Medicine

## 2023-07-03 ENCOUNTER — Telehealth: Payer: Self-pay | Admitting: Internal Medicine

## 2023-07-03 DIAGNOSIS — Z8673 Personal history of transient ischemic attack (TIA), and cerebral infarction without residual deficits: Secondary | ICD-10-CM | POA: Insufficient documentation

## 2023-07-03 DIAGNOSIS — I10 Essential (primary) hypertension: Secondary | ICD-10-CM | POA: Diagnosis not present

## 2023-07-03 DIAGNOSIS — R569 Unspecified convulsions: Secondary | ICD-10-CM | POA: Insufficient documentation

## 2023-07-03 DIAGNOSIS — I639 Cerebral infarction, unspecified: Secondary | ICD-10-CM | POA: Diagnosis not present

## 2023-07-03 DIAGNOSIS — I509 Heart failure, unspecified: Secondary | ICD-10-CM | POA: Diagnosis not present

## 2023-07-03 DIAGNOSIS — I110 Hypertensive heart disease with heart failure: Secondary | ICD-10-CM | POA: Insufficient documentation

## 2023-07-03 LAB — URINE DRUG SCREEN, QUALITATIVE (ARMC ONLY)
Amphetamines, Ur Screen: NOT DETECTED
Barbiturates, Ur Screen: NOT DETECTED
Benzodiazepine, Ur Scrn: NOT DETECTED
Cannabinoid 50 Ng, Ur ~~LOC~~: NOT DETECTED
Cocaine Metabolite,Ur ~~LOC~~: NOT DETECTED
MDMA (Ecstasy)Ur Screen: NOT DETECTED
Methadone Scn, Ur: NOT DETECTED
Opiate, Ur Screen: NOT DETECTED
Phencyclidine (PCP) Ur S: NOT DETECTED
Tricyclic, Ur Screen: POSITIVE — AB

## 2023-07-03 LAB — CBC
HCT: 47.9 % (ref 39.0–52.0)
Hemoglobin: 15.1 g/dL (ref 13.0–17.0)
MCH: 32.2 pg (ref 26.0–34.0)
MCHC: 31.5 g/dL (ref 30.0–36.0)
MCV: 102.1 fL — ABNORMAL HIGH (ref 80.0–100.0)
Platelets: 261 10*3/uL (ref 150–400)
RBC: 4.69 MIL/uL (ref 4.22–5.81)
RDW: 14.6 % (ref 11.5–15.5)
WBC: 4 10*3/uL (ref 4.0–10.5)
nRBC: 0 % (ref 0.0–0.2)

## 2023-07-03 LAB — COMPREHENSIVE METABOLIC PANEL
ALT: 10 U/L (ref 0–44)
AST: 21 U/L (ref 15–41)
Albumin: 3.4 g/dL — ABNORMAL LOW (ref 3.5–5.0)
Alkaline Phosphatase: 60 U/L (ref 38–126)
Anion gap: 12 (ref 5–15)
BUN: 24 mg/dL — ABNORMAL HIGH (ref 6–20)
CO2: 29 mmol/L (ref 22–32)
Calcium: 8.5 mg/dL — ABNORMAL LOW (ref 8.9–10.3)
Chloride: 90 mmol/L — ABNORMAL LOW (ref 98–111)
Creatinine, Ser: 1.69 mg/dL — ABNORMAL HIGH (ref 0.61–1.24)
GFR, Estimated: 47 mL/min — ABNORMAL LOW (ref >60)
Glucose, Bld: 92 mg/dL (ref 70–99)
Potassium: 3 mmol/L — ABNORMAL LOW (ref 3.5–5.1)
Sodium: 131 mmol/L — ABNORMAL LOW (ref 135–145)
Total Bilirubin: 1 mg/dL (ref 0.3–1.2)
Total Protein: 7.2 g/dL (ref 6.5–8.1)

## 2023-07-03 MED ORDER — CHLORDIAZEPOXIDE HCL 25 MG PO CAPS
50.0000 mg | ORAL_CAPSULE | Freq: Once | ORAL | Status: AC
  Administered 2023-07-03: 50 mg via ORAL
  Filled 2023-07-03: qty 2

## 2023-07-03 MED ORDER — LEVETIRACETAM 500 MG PO TABS: 500.0000 mg | ORAL_TABLET | Freq: Two times a day (BID) | ORAL | 5 refills | Status: AC

## 2023-07-03 MED ORDER — LEVETIRACETAM IN NACL 1500 MG/100ML IV SOLN
1500.0000 mg | Freq: Once | INTRAVENOUS | Status: AC
  Administered 2023-07-03: 1500 mg via INTRAVENOUS
  Filled 2023-07-03: qty 100

## 2023-07-03 MED ORDER — SODIUM CHLORIDE 0.9 % IV BOLUS
1000.0000 mL | Freq: Once | INTRAVENOUS | Status: AC
  Administered 2023-07-03: 1000 mL via INTRAVENOUS

## 2023-07-03 NOTE — ED Provider Notes (Signed)
[facility] Provider Note    Event Date/Time   First MD Initiated Contact with Patient 07/03/23 1501     (approximate)  History   Chief Complaint: Seizures  HPI  Andrew Sparks is a 57 y.o. 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  According to EMS report patient is coming from home for seizure-like activity.  States a friend called 911 after witnessing seizure activity.  Patient was incontinent during the event.  Here the patient is awake and alert states he has had a couple seizures previously.  Patient states he ran out of his Keppra approximately 4 days ago.  Also states daily alcohol use but is not drinking any alcohol today.  Patient denies of a headache denies any recent fever denies any chest pain shortness of breath.  Only symptom is fatigue currently.  Overall appears well sitting in bed watching TV.  Physical Exam   Triage Vital Signs: ED Triage Vitals  Encounter Vitals Group     BP 07/03/23 1500 111/77     Systolic BP Percentile --      Diastolic BP Percentile --      Pulse Rate 07/03/23 1500 71     Resp 07/03/23 1500 (!) 22     Temp 07/03/23 1500 98.2 F (36.8 C)     Temp Source 07/03/23 1500 Oral     SpO2 07/03/23 1500 97 %     Weight --      Height --      Head Circumference --      Peak Flow --      Pain Score 07/03/23 1442 0     Pain Loc --      Pain Education --      Exclude from Growth Chart --     Most recent vital signs: Vitals:   07/03/23 1500  BP: 111/77  Pulse: 71  Resp: (!) 22  Temp: 98.2 F (36.8 C)  SpO2: 97%    General: Awake, no distress.  No signs of head trauma. CV:  Good peripheral perfusion.  Regular rate and rhythm  Resp:  Normal effort.  Equal breath sounds bilaterally.  Abd:  No distention.  Soft, nontender.  No rebound or guarding.  ED  Results / Procedures / Treatments   EKG  EKG viewed and interpreted by myself shows atrial fibrillation at 70 bpm with a narrow QRS, right axis deviation, QTc prolongation otherwise normal intervals with nonspecific but no concerning ST changes.  RADIOLOGY  I have reviewed and interpreted CT head images.  No obvious bleed seen on my evaluation.   MEDICATIONS ORDERED IN ED: Medications  levETIRAcetam (KEPPRA) IVPB 1500 mg/ 100 mL premix (has no administration in time range)  chlordiazePOXIDE (LIBRIUM) capsule 50 mg (has no administration in time range)  sodium chloride 0.9 % bolus 1,000 mL (has no administration in time range)     IMPRESSION / MDM / ASSESSMENT AND PLAN / ED COURSE  I reviewed the triage vital signs and the nursing notes.  Patient's presentation is most consistent with acute presentation with potential threat to life or bodily function.  Patient presents to the emergency department for seizure-like activity coming from home.  Patient states he ran out of his Keppra approximately 4 days ago he also is a daily alcohol user but is not drinking any alcohol today raising the  concern for possible epileptic seizure versus alcohol withdrawal seizure.  Patient is on Eliquis for atrial fibrillation and prior CVA.  We will load with IV Keppra 1500 mg we will dose oral Librium will obtain labs and a CT scan of the head to further evaluate.  Patient agreeable to plan of care and workup.  Patient CT scan of the head is negative for acute abnormality.  Patient's lab work shows no significant abnormalities beside renal insufficiency.  Patient has received IV fluids recommended to continue oral intake of fluids at home.  CBC is normal.  Patient's urine drug screen is positive for TCAs only.  Patient has been out of his Keppra for the past 4 days patient has been loaded with IV Keppra no further seizure-like activity in the emergency department.  Will plan to discharge the patient with a  refill for his Keppra to be filled and started tomorrow.  Patient to follow-up with his doctor.  Patient agreeable to plan of care.  FINAL CLINICAL IMPRESSION(S) / ED DIAGNOSES   Seizure  Rx / DC Orders   Keppra refill  Note:  This document was prepared using Dragon voice recognition software and may include unintentional dictation errors.   Minna Antis, MD 07/03/23 2039

## 2023-07-03 NOTE — ED Notes (Signed)
Pt provided paper scrubs due to soiling his clothing earlier. S/O notified of the patients whereabouts. Pt also provided a sandwich tray and sprite.

## 2023-07-03 NOTE — ED Notes (Signed)
Family updated as to patient's status.

## 2023-07-03 NOTE — Telephone Encounter (Signed)
Patient's brother called and states the patient had a seizure and is now at the hospital. He was supposed to come see TJ Friday but the brother doesn't think he will make it to that appointment. The brother says that the patient is wanting to go to rehab for his alcohol abuse. The brother is requesting to speak to TJ directly. He would like some advice on what to do. Please advise or call brother.

## 2023-07-03 NOTE — ED Triage Notes (Signed)
[facility] reports pt coming from home for a seizure. Friend at house called 911. Described as grand mal. Pt incontinent of urine and feces. Upon EMS arrival pt CAOx4. Pt denies any hx of seizures.

## 2023-07-06 ENCOUNTER — Ambulatory Visit: Payer: Medicare HMO | Admitting: Internal Medicine

## 2023-07-09 ENCOUNTER — Telehealth: Payer: Self-pay | Admitting: *Deleted

## 2023-07-09 NOTE — Telephone Encounter (Signed)
Transition Care Management Unsuccessful Follow-up Telephone Call  Date of discharge and from where:  [facility]  07/03/2023  Attempts:  1st Attempt  Reason for unsuccessful TCM follow-up call:  Left voice message

## 2023-07-10 ENCOUNTER — Ambulatory Visit: Payer: Medicare HMO | Admitting: Internal Medicine

## 2023-07-12 ENCOUNTER — Telehealth: Payer: Self-pay | Admitting: *Deleted

## 2023-07-12 NOTE — Telephone Encounter (Signed)
Transition Care Management Unsuccessful Follow-up Telephone Call  Date of discharge and from where:  [facility] 7/30 /2024  Attempts:  2nd Attempt  Reason for unsuccessful TCM follow-up call:  No answer/busy

## 2023-07-28 ENCOUNTER — Other Ambulatory Visit: Payer: Self-pay | Admitting: Internal Medicine

## 2023-07-28 DIAGNOSIS — I10 Essential (primary) hypertension: Secondary | ICD-10-CM

## 2023-07-28 DIAGNOSIS — I5032 Chronic diastolic (congestive) heart failure: Secondary | ICD-10-CM

## 2023-07-30 ENCOUNTER — Ambulatory Visit (INDEPENDENT_AMBULATORY_CARE_PROVIDER_SITE_OTHER): Payer: Medicare HMO | Admitting: Internal Medicine

## 2023-07-30 ENCOUNTER — Encounter: Payer: Self-pay | Admitting: Internal Medicine

## 2023-07-30 ENCOUNTER — Ambulatory Visit: Payer: Medicare HMO | Admitting: Internal Medicine

## 2023-07-30 VITALS — BP 150/120 | HR 64 | Ht 70.0 in | Wt 234.0 lb

## 2023-07-30 DIAGNOSIS — I10 Essential (primary) hypertension: Secondary | ICD-10-CM | POA: Diagnosis not present

## 2023-07-30 DIAGNOSIS — F341 Dysthymic disorder: Secondary | ICD-10-CM

## 2023-07-30 DIAGNOSIS — I5032 Chronic diastolic (congestive) heart failure: Secondary | ICD-10-CM

## 2023-07-30 DIAGNOSIS — G621 Alcoholic polyneuropathy: Secondary | ICD-10-CM | POA: Diagnosis not present

## 2023-07-30 DIAGNOSIS — E785 Hyperlipidemia, unspecified: Secondary | ICD-10-CM

## 2023-07-30 MED ORDER — DULOXETINE HCL 20 MG PO CPEP
20.0000 mg | ORAL_CAPSULE | Freq: Every day | ORAL | 1 refills | Status: DC
Start: 2023-07-30 — End: 2023-10-08

## 2023-07-30 MED ORDER — FUROSEMIDE 40 MG PO TABS
40.0000 mg | ORAL_TABLET | Freq: Every day | ORAL | 0 refills | Status: AC
Start: 2023-07-30 — End: 2023-08-29

## 2023-07-30 MED ORDER — ALBUTEROL SULFATE HFA 108 (90 BASE) MCG/ACT IN AERS: 1.0000 | INHALATION_SPRAY | RESPIRATORY_TRACT | 2 refills | Status: DC

## 2023-07-30 MED ORDER — SERTRALINE HCL 50 MG PO TABS
50.0000 mg | ORAL_TABLET | Freq: Every day | ORAL | 1 refills | Status: DC
Start: 2023-07-30 — End: 2023-07-30

## 2023-07-30 NOTE — Progress Notes (Signed)
Established Patient Office Visit  Subjective:  Patient ID: Andrew Sparks, male    DOB: December 19, 1965  Age: 57 y.o. MRN: [id number]  Chief Complaint  Patient presents with   Follow-up    2 week BP F/U    No new complaints, here for lab review and medication refills. BP elevated but didn't take his meds in a rush to keep his appt. Lost weight after diuresis in the hospital and denies SOB. Stopped drinking two months ago.    No other concerns at this time.   Past Medical History:  Diagnosis Date   A-fib (HCC)    Anxiety    CHF (congestive heart failure) (HCC)    Depression    Hypersomnia with sleep apnea    Hypertension    Morbid obesity (HCC)    NSTEMI (non-ST elevated myocardial infarction) (HCC) 2005   Obesity hypoventilation syndrome (HCC)    Shortness of breath dyspnea    Sleep apnea    Stroke (HCC)    V-tach [facility])     Past Surgical History:  Procedure Laterality Date   LEFT HEART CATH AND CORONARY ANGIOGRAPHY Right 09/10/2018   Procedure: LEFT HEART CATH AND CORONARY ANGIOGRAPHY with possible percutaneous intervention;  Surgeon: Laurier Nancy, MD;  Location: [facility] INVASIVE CV LAB;  Service: Cardiovascular;  Laterality: Right;   TRACHEOSTOMY      Social History   Socioeconomic History   Marital status: Single    Spouse name: Not on file   Number of children: Not on file   Years of education: Not on file   Highest education level: Not on file  Occupational History   Occupation: disabled  Tobacco Use   Smoking status: Every Day    Current packs/day: 0.25    Average packs/day: 0.3 packs/day for 28.0 years (7.0 ttl pk-yrs)    Types: Cigarettes   Smokeless tobacco: Never  Vaping Use   Vaping status: Never Used  Substance and Sexual Activity   Alcohol use: Yes    Alcohol/week: 4.0 standard drinks of alcohol    Types: 4 Cans of beer per week    Comment: 1/5 of whiskey dailty for last 5 days   Drug use: Yes    Types: Marijuana   Sexual activity: Yes   Other Topics Concern   Not on file  Social History Narrative   Not on file   Social Determinants of Health   Financial Resource Strain: Medium Risk (01/30/2023)   Received [facility], [facility]   Overall Financial Resource Strain (CARDIA)    Difficulty of Paying Living Expenses: Somewhat hard  Food Insecurity: No Food Insecurity (01/18/2023)   Hunger Vital Sign    Worried About Running Out of Food in the Last Year: Never true    Ran Out of Food in the Last Year: Never true  Transportation Needs: No Transportation Needs (01/18/2023)   PRAPARE - Administrator, Civil Service (Medical): No    Lack of Transportation (Non-Medical): No  Physical Activity: Not on file  Stress: Not on file  Social Connections: Not on file  Intimate Partner Violence: Not At Risk (01/18/2023)   Humiliation, Afraid, Rape, and Kick questionnaire    Fear of Current or Ex-Partner: No    Emotionally Abused: No    Physically Abused: No    Sexually Abused: No    Family History  Problem Relation Age of Onset   Heart disease Father    Diabetes type II Mother  Breast cancer Mother     No Known Allergies  Review of Systems  Constitutional: Negative.   HENT: Negative.    Eyes: Negative.   Respiratory: Negative.    Cardiovascular: Negative.   Gastrointestinal: Negative.   Genitourinary: Negative.   Skin: Negative.   Neurological:  Positive for tingling.  Endo/Heme/Allergies: Negative.   Psychiatric/Behavioral:  The patient has insomnia.        Objective:   BP (!) 150/120   Pulse 64   Ht 5\' 10"  (1.778 m)   Wt 234 lb (106.1 kg)   SpO2 97%   BMI 33.58 kg/m   Vitals:   07/30/23 1052  BP: (!) 150/120  Pulse: 64  Height: 5\' 10"  (1.778 m)  Weight: 234 lb (106.1 kg)  SpO2: 97%  BMI (Calculated): 33.58    Physical Exam Vitals reviewed.  Constitutional:      Appearance: Normal appearance.  HENT:     Head: Normocephalic.     Left Ear: There is no impacted  cerumen.     Nose: Nose normal.     Mouth/Throat:     Mouth: Mucous membranes are moist.     Pharynx: No posterior oropharyngeal erythema.  Eyes:     Extraocular Movements: Extraocular movements intact.     Pupils: Pupils are equal, round, and reactive to light.  Cardiovascular:     Rate and Rhythm: Regular rhythm.     Chest Wall: PMI is not displaced.     Pulses: Normal pulses.     Heart sounds: Normal heart sounds. No murmur heard. Pulmonary:     Effort: Pulmonary effort is normal.     Breath sounds: Normal air entry. No rhonchi or rales.  Abdominal:     General: Abdomen is flat. Bowel sounds are normal. There is no distension.     Palpations: Abdomen is soft. There is no hepatomegaly, splenomegaly or mass.     Tenderness: There is no abdominal tenderness.  Musculoskeletal:        General: Normal range of motion.     Cervical back: Normal range of motion and neck supple.     Right lower leg: No edema.     Left lower leg: No edema.  Skin:    General: Skin is warm and dry.  Neurological:     General: No focal deficit present.     Mental Status: He is alert and oriented to person, place, and time.     Cranial Nerves: No cranial nerve deficit.     Motor: No weakness.  Psychiatric:        Mood and Affect: Mood normal.        Behavior: Behavior normal.      No results found for any visits on 07/30/23.  Recent Results (from the past 2160 hour(Lailyn Appelbaum))  CBC     Status: Abnormal   Collection Time: 07/03/23  2:43 PM  Result Value Ref Range   WBC 4.0 4.0 - 10.5 K/uL   RBC 4.69 4.22 - 5.81 MIL/uL   Hemoglobin 15.1 13.0 - 17.0 g/dL   HCT 16.1 09.6 - 04.5 %   MCV 102.1 (H) 80.0 - 100.0 fL   MCH 32.2 26.0 - 34.0 pg   MCHC 31.5 30.0 - 36.0 g/dL   RDW 40.9 81.1 - 91.4 %   Platelets 261 150 - 400 K/uL   nRBC 0.0 0.0 - 0.2 %    Comment: Performed at [facility], [address], [place], [place] [address]  Comprehensive  metabolic panel     Status: Abnormal   Collection  Time: 07/03/23  2:43 PM  Result Value Ref Range   Sodium 131 (L) 135 - 145 mmol/L   Potassium 3.0 (L) 3.5 - 5.1 mmol/L   Chloride 90 (L) 98 - 111 mmol/L   CO2 29 22 - 32 mmol/L   Glucose, Bld 92 70 - 99 mg/dL    Comment: Glucose reference range applies only to samples taken after fasting for at least 8 hours.   BUN 24 (H) 6 - 20 mg/dL   Creatinine, Ser 6.04 (H) 0.61 - 1.24 mg/dL   Calcium 8.5 (L) 8.9 - 10.3 mg/dL   Total Protein 7.2 6.5 - 8.1 g/dL   Albumin 3.4 (L) 3.5 - 5.0 g/dL   AST 21 15 - 41 U/L   ALT 10 0 - 44 U/L   Alkaline Phosphatase 60 38 - 126 U/L   Total Bilirubin 1.0 0.3 - 1.2 mg/dL   GFR, Estimated 47 (L) >60 mL/min    Comment: (NOTE) Calculated using the CKD-EPI Creatinine Equation (2021)    Anion gap 12 5 - 15    Comment: Performed at [facility], [address], [place], [place] [address]  Urine Drug Screen, Qualitative ([facility] only)     Status: Abnormal   Collection Time: 07/03/23  6:24 PM  Result Value Ref Range   Tricyclic, Ur Screen POSITIVE (A) NONE DETECTED   Amphetamines, Ur Screen NONE DETECTED NONE DETECTED   MDMA (Ecstasy)Ur Screen NONE DETECTED NONE DETECTED   Cocaine Metabolite,Ur [place] NONE DETECTED NONE DETECTED   Opiate, Ur Screen NONE DETECTED NONE DETECTED   Phencyclidine (PCP) Ur Aleighya Mcanelly NONE DETECTED NONE DETECTED   Cannabinoid 50 Ng, Ur [place] NONE DETECTED NONE DETECTED   Barbiturates, Ur Screen NONE DETECTED NONE DETECTED   Benzodiazepine, Ur Scrn NONE DETECTED NONE DETECTED   Methadone Scn, Ur NONE DETECTED NONE DETECTED    Comment: (NOTE) Tricyclics + metabolites, urine    Cutoff 1000 ng/mL Amphetamines + metabolites, urine  Cutoff 1000 ng/mL MDMA (Ecstasy), urine              Cutoff 500 ng/mL Cocaine Metabolite, urine          Cutoff 300 ng/mL Opiate + metabolites, urine        Cutoff 300 ng/mL Phencyclidine (PCP), urine         Cutoff 25 ng/mL Cannabinoid, urine                 Cutoff 50 ng/mL Barbiturates + metabolites, urine  Cutoff  200 ng/mL Benzodiazepine, urine              Cutoff 200 ng/mL Methadone, urine                   Cutoff 300 ng/mL  The urine drug screen provides only a preliminary, unconfirmed analytical test result and should not be used for non-medical purposes. Clinical consideration and professional judgment should be applied to any positive drug screen result due to possible interfering substances. A more specific alternate chemical method must be used in order to obtain a confirmed analytical result. Gas chromatography / mass spectrometry (GC/MS) is the preferred confirm atory method. Performed at [facility], [address] [place], [place] [address]       Assessment & Plan:  As per problem list  Problem List Items Addressed This Visit       Cardiovascular and Mediastinum   Essential hypertension -  Primary (Chronic)   Relevant Medications   furosemide (LASIX) 40 MG tablet   Chronic diastolic CHF (congestive heart failure) (HCC)   Relevant Medications   furosemide (LASIX) 40 MG tablet     Nervous and Auditory   Alcohol-induced polyneuropathy (HCC)   Relevant Medications   DULoxetine (CYMBALTA) 20 MG capsule     Other   Depression   Relevant Medications   DULoxetine (CYMBALTA) 20 MG capsule   Dyslipidemia    Return in about 6 weeks (around 09/10/2023) for Deprression fu.   Total time spent: 20 minutes  Luna Fuse, MD  07/30/2023   This document may have been prepared by [facility] Voice Recognition software and as such may include unintentional dictation errors.

## 2023-08-03 ENCOUNTER — Other Ambulatory Visit: Payer: Self-pay | Admitting: Internal Medicine

## 2023-08-03 ENCOUNTER — Telehealth: Payer: Self-pay

## 2023-08-03 DIAGNOSIS — I10 Essential (primary) hypertension: Secondary | ICD-10-CM

## 2023-08-03 DIAGNOSIS — I5032 Chronic diastolic (congestive) heart failure: Secondary | ICD-10-CM

## 2023-08-03 NOTE — Telephone Encounter (Signed)
Patients cousin lisa called stating that he told her he was cleared to go to rehab but she wanted to confirm this with you as well as find out if she needs any documentation for this process?

## 2023-08-09 NOTE — Telephone Encounter (Signed)
Spoke with lisa who was informed that PCP has cleared pt for rehab. Misty Stanley was given [facility] fax number in the case of a rehab center needing to send over any required paperwork we need to fill out for pt to be admitted in, [place] verbalized understanding.

## 2023-08-31 ENCOUNTER — Other Ambulatory Visit: Payer: Self-pay | Admitting: Internal Medicine

## 2023-08-31 DIAGNOSIS — I5032 Chronic diastolic (congestive) heart failure: Secondary | ICD-10-CM

## 2023-09-10 ENCOUNTER — Ambulatory Visit: Payer: Medicare HMO | Admitting: Internal Medicine

## 2023-10-05 ENCOUNTER — Other Ambulatory Visit: Payer: Medicare HMO

## 2023-10-05 ENCOUNTER — Other Ambulatory Visit: Payer: Self-pay | Admitting: Internal Medicine

## 2023-10-05 DIAGNOSIS — E876 Hypokalemia: Secondary | ICD-10-CM | POA: Diagnosis not present

## 2023-10-05 DIAGNOSIS — I63549 Cerebral infarction due to unspecified occlusion or stenosis of unspecified cerebellar artery: Secondary | ICD-10-CM | POA: Diagnosis not present

## 2023-10-06 LAB — BMP8
BUN: 13 mg/dL (ref 6–24)
CO2: 26 mmol/L (ref 20–29)
Calcium: 9.1 mg/dL (ref 8.7–10.2)
Chloride: 95 mmol/L — ABNORMAL LOW (ref 96–106)
Creatinine, Ser: 1.15 mg/dL (ref 0.76–1.27)
Glucose: 77 mg/dL (ref 70–99)
Potassium: 3.5 mmol/L (ref 3.5–5.2)
Sodium: 138 mmol/L (ref 134–144)
eGFR: 74 mL/min/{1.73_m2} (ref >59)

## 2023-10-06 LAB — LIPID PANEL W/O CHOL/HDL RATIO
Cholesterol, Total: 178 mg/dL (ref 100–199)
HDL: 62 mg/dL (ref >39)
LDL Chol Calc (NIH): 91 mg/dL (ref 0–99)
Triglycerides: 145 mg/dL (ref 0–149)
VLDL Cholesterol Cal: 25 mg/dL (ref 5–40)

## 2023-10-06 LAB — HEPATIC FUNCTION PANEL
ALT: 8 [IU]/L (ref 0–44)
AST: 21 [IU]/L (ref 0–40)
Albumin: 3.8 g/dL (ref 3.8–4.9)
Alkaline Phosphatase: 78 [IU]/L (ref 44–121)
Bilirubin Total: 0.8 mg/dL (ref 0.0–1.2)
Bilirubin, Direct: 0.26 mg/dL (ref 0.00–0.40)
Total Protein: 6.7 g/dL (ref 6.0–8.5)

## 2023-10-06 LAB — CK: Total CK: 134 U/L (ref 41–331)

## 2023-10-08 ENCOUNTER — Ambulatory Visit (INDEPENDENT_AMBULATORY_CARE_PROVIDER_SITE_OTHER): Payer: Medicare HMO | Admitting: Internal Medicine

## 2023-10-08 ENCOUNTER — Encounter: Payer: Self-pay | Admitting: Internal Medicine

## 2023-10-08 ENCOUNTER — Ambulatory Visit: Payer: Medicare HMO | Admitting: Internal Medicine

## 2023-10-08 ENCOUNTER — Telehealth: Payer: Self-pay

## 2023-10-08 VITALS — BP 160/120 | HR 100 | Ht 70.0 in | Wt 248.0 lb

## 2023-10-08 DIAGNOSIS — I5032 Chronic diastolic (congestive) heart failure: Secondary | ICD-10-CM

## 2023-10-08 DIAGNOSIS — F341 Dysthymic disorder: Secondary | ICD-10-CM | POA: Diagnosis not present

## 2023-10-08 DIAGNOSIS — G621 Alcoholic polyneuropathy: Secondary | ICD-10-CM

## 2023-10-08 DIAGNOSIS — J069 Acute upper respiratory infection, unspecified: Secondary | ICD-10-CM | POA: Diagnosis not present

## 2023-10-08 DIAGNOSIS — E785 Hyperlipidemia, unspecified: Secondary | ICD-10-CM

## 2023-10-08 DIAGNOSIS — I10 Essential (primary) hypertension: Secondary | ICD-10-CM

## 2023-10-08 LAB — POCT XPERT XPRESS SARS COVID-2/FLU/RSV
FLU A: NEGATIVE
FLU B: NEGATIVE
RSV RNA, PCR: NEGATIVE
SARS Coronavirus 2: NEGATIVE

## 2023-10-08 MED ORDER — GABAPENTIN 600 MG PO TABS
600.0000 mg | ORAL_TABLET | Freq: Every day | ORAL | 0 refills | Status: AC
Start: 2023-10-08 — End: 2024-01-06

## 2023-10-08 MED ORDER — ALBUTEROL SULFATE HFA 108 (90 BASE) MCG/ACT IN AERS: 1.0000 | INHALATION_SPRAY | RESPIRATORY_TRACT | 2 refills | Status: AC

## 2023-10-08 MED ORDER — QUETIAPINE FUMARATE 50 MG PO TABS
50.0000 mg | ORAL_TABLET | Freq: Every day | ORAL | 0 refills | Status: AC
Start: 2023-10-08 — End: 2024-01-06

## 2023-10-08 MED ORDER — ROSUVASTATIN CALCIUM 20 MG PO TABS: 20.0000 mg | ORAL_TABLET | Freq: Every day | ORAL | 0 refills | Status: AC

## 2023-10-08 MED ORDER — CHLORTHALIDONE 25 MG PO TABS
25.0000 mg | ORAL_TABLET | Freq: Every morning | ORAL | 0 refills | Status: AC
Start: 2023-10-08 — End: 2024-01-06

## 2023-10-08 MED ORDER — FUROSEMIDE 40 MG PO TABS
40.0000 mg | ORAL_TABLET | Freq: Every day | ORAL | 2 refills | Status: AC
Start: 2023-10-08 — End: ?

## 2023-10-08 MED ORDER — FUROSEMIDE 40 MG PO TABS
40.0000 mg | ORAL_TABLET | Freq: Every day | ORAL | 0 refills | Status: AC
Start: 2023-10-08 — End: 2024-10-07

## 2023-10-08 MED ORDER — METOPROLOL TARTRATE 100 MG PO TABS
100.0000 mg | ORAL_TABLET | Freq: Two times a day (BID) | ORAL | 0 refills | Status: DC
Start: 2023-10-08 — End: 2023-11-19

## 2023-10-08 MED ORDER — LISINOPRIL 40 MG PO TABS: 40.0000 mg | ORAL_TABLET | Freq: Every day | ORAL | 0 refills | Status: AC

## 2023-10-08 MED ORDER — DULOXETINE HCL 20 MG PO CPEP: 20.0000 mg | ORAL_CAPSULE | Freq: Every day | ORAL | 1 refills | Status: DC

## 2023-10-08 NOTE — Progress Notes (Unsigned)
Established Patient Office Visit  Subjective:  Patient ID: Andrew Sparks, male    DOB: June 20, 1966  Age: 57 y.o. MRN: [id number]  No chief complaint on file.   No new complaints, here for lab review and medication refills. LDL and TC well controlled on lab review. Triglycerides also satisfactory and CMP notable for normalization of renal function. Thinks he was in contact with Covid.    No other concerns at this time.   Past Medical History:  Diagnosis Date  . A-fib (HCC)   . Anxiety   . CHF (congestive heart failure) (HCC)   . Depression   . Hypersomnia with sleep apnea   . Hypertension   . Morbid obesity (HCC)   . NSTEMI (non-ST elevated myocardial infarction) (HCC) 2005  . Obesity hypoventilation syndrome (HCC)   . Shortness of breath dyspnea   . Sleep apnea   . Stroke (HCC)   . V-tach [facility])     Past Surgical History:  Procedure Laterality Date  . LEFT HEART CATH AND CORONARY ANGIOGRAPHY Right 09/10/2018   Procedure: LEFT HEART CATH AND CORONARY ANGIOGRAPHY with possible percutaneous intervention;  Surgeon: Laurier Nancy, MD;  Location: [facility] INVASIVE CV LAB;  Service: Cardiovascular;  Laterality: Right;  . TRACHEOSTOMY      Social History   Socioeconomic History  . Marital status: Single    Spouse name: Not on file  . Number of children: Not on file  . Years of education: Not on file  . Highest education level: Not on file  Occupational History  . Occupation: disabled  Tobacco Use  . Smoking status: Every Day    Current packs/day: 0.25    Average packs/day: 0.3 packs/day for 28.0 years (7.0 ttl pk-yrs)    Types: Cigarettes  . Smokeless tobacco: Never  Vaping Use  . Vaping status: Never Used  Substance and Sexual Activity  . Alcohol use: Yes    Alcohol/week: 4.0 standard drinks of alcohol    Types: 4 Cans of beer per week    Comment: 1/5 of whiskey dailty for last 5 days  . Drug use: Yes    Types: Marijuana  . Sexual activity: Yes  Other  Topics Concern  . Not on file  Social History Narrative  . Not on file   Social Determinants of Health   Financial Resource Strain: Medium Risk (01/30/2023)   Received [facility], [facility]   Overall Financial Resource Strain (CARDIA)   . Difficulty of Paying Living Expenses: Somewhat hard  Food Insecurity: No Food Insecurity (01/18/2023)   Hunger Vital Sign   . Worried About Programme researcher, broadcasting/film/video in the Last Year: Never true   . Ran Out of Food in the Last Year: Never true  Transportation Needs: No Transportation Needs (01/18/2023)   PRAPARE - Transportation   . Lack of Transportation (Medical): No   . Lack of Transportation (Non-Medical): No  Physical Activity: Not on file  Stress: Not on file  Social Connections: Not on file  Intimate Partner Violence: Not At Risk (01/18/2023)   Humiliation, Afraid, Rape, and Kick questionnaire   . Fear of Current or Ex-Partner: No   . Emotionally Abused: No   . Physically Abused: No   . Sexually Abused: No    Family History  Problem Relation Age of Onset  . Heart disease Father   . Diabetes type II Mother   . Breast cancer Mother     No Known Allergies  Review  of Systems  Constitutional:  Negative for weight loss (gained 14 lbs since August).  HENT: Negative.    Eyes: Negative.   Respiratory: Negative.    Cardiovascular: Negative.   Gastrointestinal: Negative.   Genitourinary: Negative.   Skin: Negative.   Neurological:  Positive for tingling.  Endo/Heme/Allergies: Negative.   Psychiatric/Behavioral:  The patient has insomnia.        Objective:   BP (!) 160/120   Pulse 100   Ht 5\' 10"  (1.778 m)   Wt 248 lb (112.5 kg)   SpO2 95%   BMI 35.58 kg/m   Vitals:   10/08/23 1329  BP: (!) 160/120  Pulse: 100  Height: 5\' 10"  (1.778 m)  Weight: 248 lb (112.5 kg)  SpO2: 95%  BMI (Calculated): 35.58    Physical Exam Vitals reviewed.  Constitutional:      Appearance: Normal appearance.  HENT:     Head:  Normocephalic.     Left Ear: There is no impacted cerumen.     Nose: Nose normal.     Mouth/Throat:     Mouth: Mucous membranes are moist.     Pharynx: No posterior oropharyngeal erythema.  Eyes:     Extraocular Movements: Extraocular movements intact.     Pupils: Pupils are equal, round, and reactive to light.  Cardiovascular:     Rate and Rhythm: Regular rhythm.     Chest Wall: PMI is not displaced.     Pulses: Normal pulses.     Heart sounds: Normal heart sounds. No murmur heard. Pulmonary:     Effort: Pulmonary effort is normal.     Breath sounds: Normal air entry. No rhonchi or rales.  Abdominal:     General: Abdomen is flat. Bowel sounds are normal. There is no distension.     Palpations: Abdomen is soft. There is no hepatomegaly, splenomegaly or mass.     Tenderness: There is no abdominal tenderness.  Musculoskeletal:        General: Normal range of motion.     Cervical back: Normal range of motion and neck supple.     Right lower leg: No edema.     Left lower leg: No edema.  Skin:    General: Skin is warm and dry.  Neurological:     General: No focal deficit present.     Mental Status: He is alert and oriented to person, place, and time.     Cranial Nerves: No cranial nerve deficit.     Motor: No weakness.  Psychiatric:        Mood and Affect: Mood normal.        Behavior: Behavior normal.     No results found for any visits on 10/08/23.  Recent Results (from the past 2160 hour(Alric Geise))  BMP8     Status: Abnormal   Collection Time: 10/05/23  3:24 PM  Result Value Ref Range   Glucose 77 70 - 99 mg/dL   BUN 13 6 - 24 mg/dL   Creatinine, Ser 0.98 0.76 - 1.27 mg/dL   eGFR 74 >11 BJ/YNW/2.95   Sodium 138 134 - 144 mmol/L   Potassium 3.5 3.5 - 5.2 mmol/L   Chloride 95 (L) 96 - 106 mmol/L   CO2 26 20 - 29 mmol/L   Calcium 9.1 8.7 - 10.2 mg/dL  Hepatic function panel     Status: None   Collection Time: 10/05/23  3:24 PM  Result Value Ref Range   Total Protein 6.7  6.0 - 8.5  g/dL   Albumin 3.8 3.8 - 4.9 g/dL   Bilirubin Total 0.8 0.0 - 1.2 mg/dL   Bilirubin, Direct 2.59 0.00 - 0.40 mg/dL   Alkaline Phosphatase 78 44 - 121 IU/L   AST 21 0 - 40 IU/L   ALT 8 0 - 44 IU/L  Lipid Panel w/o Chol/HDL Ratio     Status: None   Collection Time: 10/05/23  3:24 PM  Result Value Ref Range   Cholesterol, Total 178 100 - 199 mg/dL   Triglycerides 563 0 - 149 mg/dL   HDL 62 >87 mg/dL   VLDL Cholesterol Cal 25 5 - 40 mg/dL   LDL Chol Calc (NIH) 91 0 - 99 mg/dL  CK     Status: None   Collection Time: 10/05/23  3:24 PM  Result Value Ref Range   Total CK 134 41 - 331 U/L      Assessment & Plan:  As per problem list  Problem List Items Addressed This Visit       Cardiovascular and Mediastinum   Essential hypertension (Chronic)   Relevant Medications   metoprolol succinate (TOPROL-XL) 100 MG 24 hr tablet   spironolactone (ALDACTONE) 25 MG tablet   furosemide (LASIX) 40 MG tablet   metoprolol tartrate (LOPRESSOR) 100 MG tablet   chlorthalidone (HYGROTON) 25 MG tablet   lisinopril (ZESTRIL) 40 MG tablet   rosuvastatin (CRESTOR) 20 MG tablet   furosemide (LASIX) 40 MG tablet   Chronic diastolic CHF (congestive heart failure) (HCC)   Relevant Medications   metoprolol succinate (TOPROL-XL) 100 MG 24 hr tablet   spironolactone (ALDACTONE) 25 MG tablet   furosemide (LASIX) 40 MG tablet   metoprolol tartrate (LOPRESSOR) 100 MG tablet   chlorthalidone (HYGROTON) 25 MG tablet   lisinopril (ZESTRIL) 40 MG tablet   rosuvastatin (CRESTOR) 20 MG tablet   furosemide (LASIX) 40 MG tablet     Nervous and Auditory   Alcohol-induced polyneuropathy (HCC)   Relevant Medications   sertraline (ZOLOFT) 50 MG tablet   venlafaxine XR (EFFEXOR-XR) 150 MG 24 hr capsule   DULoxetine (CYMBALTA) 20 MG capsule   gabapentin (NEURONTIN) 600 MG tablet   QUEtiapine (SEROQUEL) 50 MG tablet     Other   Depression   Relevant Medications   sertraline (ZOLOFT) 50 MG tablet    venlafaxine XR (EFFEXOR-XR) 150 MG 24 hr capsule   DULoxetine (CYMBALTA) 20 MG capsule   QUEtiapine (SEROQUEL) 50 MG tablet   Dyslipidemia   Relevant Medications   rosuvastatin (CRESTOR) 20 MG tablet   Other Visit Diagnoses     Viral upper respiratory tract infection    -  Primary   Relevant Orders   POCT XPERT XPRESS SARS COVID-2/FLU/RSV [[id number]]       Return in about 2 weeks (around 10/22/2023) for BP and edema followup.   Total time spent: 20 minutes  Luna Fuse, MD  10/08/2023   This document may have been prepared by [facility] Voice Recognition software and as such may include unintentional dictation errors.

## 2023-10-08 NOTE — Telephone Encounter (Signed)
Patients Covid Test came back negative, tried to call pt but the VM was full

## 2023-10-09 NOTE — Telephone Encounter (Signed)
Patient was informed by the front desk

## 2023-10-22 ENCOUNTER — Ambulatory Visit: Payer: Medicare HMO | Admitting: Internal Medicine

## 2023-11-17 ENCOUNTER — Other Ambulatory Visit: Payer: Self-pay | Admitting: Internal Medicine

## 2023-11-17 DIAGNOSIS — I10 Essential (primary) hypertension: Secondary | ICD-10-CM

## 2023-11-17 DIAGNOSIS — I5032 Chronic diastolic (congestive) heart failure: Secondary | ICD-10-CM

## 2023-12-21 ENCOUNTER — Other Ambulatory Visit: Payer: Self-pay | Admitting: Internal Medicine

## 2023-12-21 DIAGNOSIS — F341 Dysthymic disorder: Secondary | ICD-10-CM

## 2023-12-23 ENCOUNTER — Other Ambulatory Visit: Payer: Self-pay | Admitting: Internal Medicine

## 2023-12-23 DIAGNOSIS — I5032 Chronic diastolic (congestive) heart failure: Secondary | ICD-10-CM

## 2023-12-23 DIAGNOSIS — I10 Essential (primary) hypertension: Secondary | ICD-10-CM
